# Patient Record
Sex: Female | Born: 1950 | Race: White | Hispanic: No | Marital: Married | Smoking: Former smoker
Health system: Southern US, Community
[De-identification: ages and names within clinical notes are randomized; demographics above are authoritative.]

## PROBLEM LIST (undated history)

## (undated) ENCOUNTER — Emergency Department (HOSPITAL_COMMUNITY): Admission: EM | Payer: BLUE CROSS/BLUE SHIELD | Source: Home / Self Care

## (undated) DIAGNOSIS — E7800 Pure hypercholesterolemia, unspecified: Secondary | ICD-10-CM

## (undated) DIAGNOSIS — J449 Chronic obstructive pulmonary disease, unspecified: Secondary | ICD-10-CM

## (undated) DIAGNOSIS — I509 Heart failure, unspecified: Secondary | ICD-10-CM

## (undated) DIAGNOSIS — F909 Attention-deficit hyperactivity disorder, unspecified type: Secondary | ICD-10-CM

## (undated) DIAGNOSIS — G8929 Other chronic pain: Secondary | ICD-10-CM

## (undated) DIAGNOSIS — K219 Gastro-esophageal reflux disease without esophagitis: Secondary | ICD-10-CM

## (undated) DIAGNOSIS — J441 Chronic obstructive pulmonary disease with (acute) exacerbation: Secondary | ICD-10-CM

## (undated) DIAGNOSIS — G4730 Sleep apnea, unspecified: Secondary | ICD-10-CM

---

## 1998-12-31 ENCOUNTER — Ambulatory Visit (HOSPITAL_COMMUNITY): Admission: RE | Admit: 1998-12-31 | Discharge: 1998-12-31 | Payer: Self-pay | Admitting: Gastroenterology

## 1999-07-31 ENCOUNTER — Encounter: Payer: Self-pay | Admitting: Pulmonary Disease

## 1999-07-31 ENCOUNTER — Ambulatory Visit (HOSPITAL_COMMUNITY): Admission: RE | Admit: 1999-07-31 | Discharge: 1999-07-31 | Payer: Self-pay | Admitting: Pulmonary Disease

## 2002-02-16 ENCOUNTER — Encounter: Payer: Self-pay | Admitting: Pulmonary Disease

## 2002-02-16 ENCOUNTER — Ambulatory Visit (HOSPITAL_COMMUNITY): Admission: RE | Admit: 2002-02-16 | Discharge: 2002-02-16 | Payer: Self-pay | Admitting: Pulmonary Disease

## 2004-03-11 ENCOUNTER — Ambulatory Visit (HOSPITAL_COMMUNITY): Admission: RE | Admit: 2004-03-11 | Discharge: 2004-03-11 | Payer: Self-pay | Admitting: Pulmonary Disease

## 2005-01-21 ENCOUNTER — Ambulatory Visit (HOSPITAL_COMMUNITY): Admission: RE | Admit: 2005-01-21 | Discharge: 2005-01-21 | Payer: Self-pay | Admitting: Pulmonary Disease

## 2008-09-10 ENCOUNTER — Emergency Department (HOSPITAL_BASED_OUTPATIENT_CLINIC_OR_DEPARTMENT_OTHER): Admission: EM | Admit: 2008-09-10 | Discharge: 2008-09-10 | Payer: Self-pay | Admitting: Emergency Medicine

## 2015-05-13 NOTE — Progress Notes (Signed)
65 year old female with PMHx COPD former smoker, no current maintenance inhalers, presents with sepsis and acute hypoxic respiratory failure from pneumonia.  Around Xmas developed URI symptoms that smoldered for a week.  Went to her PCP last week who prescribed Zpak, which she finished ~3days ago.  In the last 24 hours, patient's breathing worsened, felt SOB overnight, feverish, came to ER today and was found:  BP 97/52 RR 22 HR 109 and SpO2 73% RA.  ABG 7.49 pCO2 34 pO2 33 and WBC 20.8K/uL.  SpO2 improved with nebs, but then worsened and so was on BiPAP, better.    CTA shows no PE but bilateral infiltrates.  Flu rapid negative.  Received levofloxacin and 2L NS there.  Lactate pending.    To stepdown here.

## 2015-05-14 ENCOUNTER — Inpatient Hospital Stay (HOSPITAL_COMMUNITY): Payer: BLUE CROSS/BLUE SHIELD

## 2015-05-14 ENCOUNTER — Inpatient Hospital Stay (HOSPITAL_COMMUNITY)
Admission: AD | Admit: 2015-05-14 | Discharge: 2015-05-30 | DRG: 870 | Disposition: A | Discharge status: Rehabilitation Facility | Payer: BLUE CROSS/BLUE SHIELD | Source: Other Acute Inpatient Hospital | Attending: Internal Medicine | Admitting: Internal Medicine

## 2015-05-14 DIAGNOSIS — F329 Major depressive disorder, single episode, unspecified: Secondary | ICD-10-CM | POA: Diagnosis present

## 2015-05-14 DIAGNOSIS — R4182 Altered mental status, unspecified: Secondary | ICD-10-CM | POA: Diagnosis present

## 2015-05-14 DIAGNOSIS — Z01818 Encounter for other preprocedural examination: Secondary | ICD-10-CM

## 2015-05-14 DIAGNOSIS — E876 Hypokalemia: Secondary | ICD-10-CM | POA: Diagnosis present

## 2015-05-14 DIAGNOSIS — E873 Alkalosis: Secondary | ICD-10-CM | POA: Diagnosis not present

## 2015-05-14 DIAGNOSIS — A419 Sepsis, unspecified organism: Secondary | ICD-10-CM | POA: Diagnosis present

## 2015-05-14 DIAGNOSIS — Z87891 Personal history of nicotine dependence: Secondary | ICD-10-CM

## 2015-05-14 DIAGNOSIS — J9690 Respiratory failure, unspecified, unspecified whether with hypoxia or hypercapnia: Secondary | ICD-10-CM

## 2015-05-14 DIAGNOSIS — J810 Acute pulmonary edema: Secondary | ICD-10-CM | POA: Diagnosis present

## 2015-05-14 DIAGNOSIS — E785 Hyperlipidemia, unspecified: Secondary | ICD-10-CM | POA: Diagnosis present

## 2015-05-14 DIAGNOSIS — G9340 Encephalopathy, unspecified: Secondary | ICD-10-CM | POA: Diagnosis not present

## 2015-05-14 DIAGNOSIS — I272 Other secondary pulmonary hypertension: Secondary | ICD-10-CM | POA: Diagnosis present

## 2015-05-14 DIAGNOSIS — Z6820 Body mass index (BMI) 20.0-20.9, adult: Secondary | ICD-10-CM

## 2015-05-14 DIAGNOSIS — I5032 Chronic diastolic (congestive) heart failure: Secondary | ICD-10-CM | POA: Diagnosis present

## 2015-05-14 DIAGNOSIS — J80 Acute respiratory distress syndrome: Secondary | ICD-10-CM | POA: Diagnosis present

## 2015-05-14 DIAGNOSIS — T502X5A Adverse effect of carbonic-anhydrase inhibitors, benzothiadiazides and other diuretics, initial encounter: Secondary | ICD-10-CM | POA: Diagnosis not present

## 2015-05-14 DIAGNOSIS — E870 Hyperosmolality and hypernatremia: Secondary | ICD-10-CM | POA: Diagnosis present

## 2015-05-14 DIAGNOSIS — E440 Moderate protein-calorie malnutrition: Secondary | ICD-10-CM | POA: Diagnosis present

## 2015-05-14 DIAGNOSIS — J441 Chronic obstructive pulmonary disease with (acute) exacerbation: Secondary | ICD-10-CM | POA: Diagnosis not present

## 2015-05-14 DIAGNOSIS — R1310 Dysphagia, unspecified: Secondary | ICD-10-CM

## 2015-05-14 DIAGNOSIS — Z4659 Encounter for fitting and adjustment of other gastrointestinal appliance and device: Secondary | ICD-10-CM

## 2015-05-14 DIAGNOSIS — Z7189 Other specified counseling: Secondary | ICD-10-CM | POA: Diagnosis present

## 2015-05-14 DIAGNOSIS — J9601 Acute respiratory failure with hypoxia: Secondary | ICD-10-CM | POA: Diagnosis not present

## 2015-05-14 DIAGNOSIS — I959 Hypotension, unspecified: Secondary | ICD-10-CM | POA: Diagnosis not present

## 2015-05-14 DIAGNOSIS — J440 Chronic obstructive pulmonary disease with acute lower respiratory infection: Secondary | ICD-10-CM | POA: Diagnosis present

## 2015-05-14 DIAGNOSIS — K5900 Constipation, unspecified: Secondary | ICD-10-CM | POA: Diagnosis not present

## 2015-05-14 DIAGNOSIS — J9600 Acute respiratory failure, unspecified whether with hypoxia or hypercapnia: Secondary | ICD-10-CM

## 2015-05-14 DIAGNOSIS — F419 Anxiety disorder, unspecified: Secondary | ICD-10-CM | POA: Diagnosis present

## 2015-05-14 DIAGNOSIS — R5381 Other malaise: Secondary | ICD-10-CM | POA: Diagnosis not present

## 2015-05-14 DIAGNOSIS — J189 Pneumonia, unspecified organism: Secondary | ICD-10-CM | POA: Diagnosis present

## 2015-05-14 DIAGNOSIS — J849 Interstitial pulmonary disease, unspecified: Secondary | ICD-10-CM | POA: Diagnosis present

## 2015-05-14 DIAGNOSIS — I5030 Unspecified diastolic (congestive) heart failure: Secondary | ICD-10-CM | POA: Diagnosis present

## 2015-05-14 DIAGNOSIS — F411 Generalized anxiety disorder: Secondary | ICD-10-CM | POA: Diagnosis not present

## 2015-05-14 DIAGNOSIS — F10239 Alcohol dependence with withdrawal, unspecified: Secondary | ICD-10-CM | POA: Diagnosis not present

## 2015-05-14 DIAGNOSIS — Z978 Presence of other specified devices: Secondary | ICD-10-CM

## 2015-05-14 DIAGNOSIS — G9341 Metabolic encephalopathy: Secondary | ICD-10-CM | POA: Diagnosis not present

## 2015-05-14 DIAGNOSIS — R0602 Shortness of breath: Secondary | ICD-10-CM | POA: Diagnosis present

## 2015-05-14 DIAGNOSIS — F32A Depression, unspecified: Secondary | ICD-10-CM | POA: Diagnosis present

## 2015-05-14 DIAGNOSIS — R0600 Dyspnea, unspecified: Secondary | ICD-10-CM | POA: Diagnosis not present

## 2015-05-14 DIAGNOSIS — E2740 Unspecified adrenocortical insufficiency: Secondary | ICD-10-CM | POA: Diagnosis present

## 2015-05-14 DIAGNOSIS — K047 Periapical abscess without sinus: Secondary | ICD-10-CM

## 2015-05-14 DIAGNOSIS — J439 Emphysema, unspecified: Secondary | ICD-10-CM | POA: Diagnosis not present

## 2015-05-14 LAB — COMPREHENSIVE METABOLIC PANEL
ALT: 16 U/L (ref 14–54)
ANION GAP: 12 (ref 5–15)
AST: 27 U/L (ref 15–41)
Albumin: 2.4 g/dL — ABNORMAL LOW (ref 3.5–5.0)
Alkaline Phosphatase: 119 U/L (ref 38–126)
BUN: 8 mg/dL (ref 6–20)
CALCIUM: 8.8 mg/dL — AB (ref 8.9–10.3)
CHLORIDE: 107 mmol/L (ref 101–111)
CO2: 23 mmol/L (ref 22–32)
Creatinine, Ser: 0.43 mg/dL — ABNORMAL LOW (ref 0.44–1.00)
Glucose, Bld: 152 mg/dL — ABNORMAL HIGH (ref 65–99)
Potassium: 3.4 mmol/L — ABNORMAL LOW (ref 3.5–5.1)
SODIUM: 142 mmol/L (ref 135–145)
Total Bilirubin: 0.6 mg/dL (ref 0.3–1.2)
Total Protein: 7.2 g/dL (ref 6.5–8.1)

## 2015-05-14 LAB — RAPID URINE DRUG SCREEN, HOSP PERFORMED
Amphetamines: POSITIVE — AB
BARBITURATES: NOT DETECTED
BENZODIAZEPINES: NOT DETECTED
Cocaine: NOT DETECTED
Opiates: POSITIVE — AB
TETRAHYDROCANNABINOL: NOT DETECTED

## 2015-05-14 LAB — URINALYSIS, ROUTINE W REFLEX MICROSCOPIC
Bilirubin Urine: NEGATIVE
Glucose, UA: NEGATIVE mg/dL
HGB URINE DIPSTICK: NEGATIVE
KETONES UR: NEGATIVE mg/dL
LEUKOCYTES UA: NEGATIVE
Nitrite: NEGATIVE
PROTEIN: NEGATIVE mg/dL
Specific Gravity, Urine: 1.01 (ref 1.005–1.030)
pH: 6 (ref 5.0–8.0)

## 2015-05-14 LAB — CBC WITH DIFFERENTIAL/PLATELET
Basophils Absolute: 0 10*3/uL (ref 0.0–0.1)
Basophils Relative: 0 %
EOS ABS: 0 10*3/uL (ref 0.0–0.7)
EOS PCT: 0 %
HCT: 34 % — ABNORMAL LOW (ref 36.0–46.0)
Hemoglobin: 11.2 g/dL — ABNORMAL LOW (ref 12.0–15.0)
LYMPHS ABS: 0.9 10*3/uL (ref 0.7–4.0)
Lymphocytes Relative: 5 %
MCH: 30.3 pg (ref 26.0–34.0)
MCHC: 32.9 g/dL (ref 30.0–36.0)
MCV: 91.9 fL (ref 78.0–100.0)
MONO ABS: 0.3 10*3/uL (ref 0.1–1.0)
MONOS PCT: 2 %
Neutro Abs: 17.4 10*3/uL — ABNORMAL HIGH (ref 1.7–7.7)
Neutrophils Relative %: 93 %
PLATELETS: 446 10*3/uL — AB (ref 150–400)
RBC: 3.7 MIL/uL — ABNORMAL LOW (ref 3.87–5.11)
RDW: 13 % (ref 11.5–15.5)
WBC: 18.6 10*3/uL — AB (ref 4.0–10.5)

## 2015-05-14 LAB — EXPECTORATED SPUTUM ASSESSMENT W GRAM STAIN, RFLX TO RESP C

## 2015-05-14 LAB — PROCALCITONIN: Procalcitonin: 0.1 ng/mL

## 2015-05-14 LAB — EXPECTORATED SPUTUM ASSESSMENT W REFEX TO RESP CULTURE

## 2015-05-14 LAB — STREP PNEUMONIAE URINARY ANTIGEN: STREP PNEUMO URINARY ANTIGEN: NEGATIVE

## 2015-05-14 LAB — TROPONIN I: TROPONIN I: 0.15 ng/mL — AB (ref <0.031)

## 2015-05-14 LAB — GLUCOSE, CAPILLARY: Glucose-Capillary: 142 mg/dL — ABNORMAL HIGH (ref 65–99)

## 2015-05-14 LAB — MRSA PCR SCREENING: MRSA by PCR: NEGATIVE

## 2015-05-14 LAB — LACTIC ACID, PLASMA: LACTIC ACID, VENOUS: 2 mmol/L (ref 0.5–2.0)

## 2015-05-14 MED ORDER — POTASSIUM CHLORIDE 20 MEQ/15ML (10%) PO SOLN: 40.0000 meq | Freq: Once | ORAL | Status: DC

## 2015-05-14 MED ORDER — VANCOMYCIN HCL 500 MG IV SOLR
500.0000 mg | Freq: Two times a day (BID) | INTRAVENOUS | Status: DC
  Administered 2015-05-14 – 2015-05-17 (×6): 500 mg via INTRAVENOUS
  Filled 2015-05-14 (×7): qty 500

## 2015-05-14 MED ORDER — DEXTROSE 5 % IV SOLN
500.0000 mg | INTRAVENOUS | Status: DC
  Administered 2015-05-14 – 2015-05-15 (×2): 500 mg via INTRAVENOUS
  Filled 2015-05-14 (×2): qty 500

## 2015-05-14 MED ORDER — ROSUVASTATIN CALCIUM 10 MG PO TABS
10.0000 mg | ORAL_TABLET | Freq: Every day | ORAL | Status: DC
  Administered 2015-05-14 – 2015-05-25 (×12): 10 mg via ORAL
  Filled 2015-05-14 (×13): qty 1

## 2015-05-14 MED ORDER — FLUOXETINE HCL 20 MG PO CAPS
40.0000 mg | ORAL_CAPSULE | Freq: Every day | ORAL | Status: DC
  Administered 2015-05-16 – 2015-05-30 (×14): 40 mg via ORAL
  Filled 2015-05-14 (×16): qty 2

## 2015-05-14 MED ORDER — VANCOMYCIN HCL IN DEXTROSE 1-5 GM/200ML-% IV SOLN
1000.0000 mg | Freq: Once | INTRAVENOUS | Status: AC
  Administered 2015-05-14: 1000 mg via INTRAVENOUS
  Filled 2015-05-14: qty 200

## 2015-05-14 MED ORDER — POTASSIUM CHLORIDE CRYS ER 20 MEQ PO TBCR
40.0000 meq | EXTENDED_RELEASE_TABLET | Freq: Once | ORAL | Status: AC
  Administered 2015-05-14: 40 meq via ORAL
  Filled 2015-05-14: qty 2

## 2015-05-14 MED ORDER — PIPERACILLIN-TAZOBACTAM 3.375 G IVPB
3.3750 g | Freq: Three times a day (TID) | INTRAVENOUS | Status: DC
  Administered 2015-05-14 – 2015-05-17 (×10): 3.375 g via INTRAVENOUS
  Filled 2015-05-14 (×11): qty 50

## 2015-05-14 MED ORDER — FLUOXETINE HCL 10 MG PO CAPS
10.0000 mg | ORAL_CAPSULE | Freq: Every day | ORAL | Status: DC
  Administered 2015-05-14: 10 mg via ORAL
  Filled 2015-05-14: qty 1

## 2015-05-14 MED ORDER — CETYLPYRIDINIUM CHLORIDE 0.05 % MT LIQD
7.0000 mL | Freq: Two times a day (BID) | OROMUCOSAL | Status: DC
  Administered 2015-05-14 (×2): 7 mL via OROMUCOSAL

## 2015-05-14 MED ORDER — HEPARIN SODIUM (PORCINE) 5000 UNIT/ML IJ SOLN
5000.0000 [IU] | Freq: Three times a day (TID) | INTRAMUSCULAR | Status: DC
  Administered 2015-05-14 – 2015-05-15 (×4): 5000 [IU] via SUBCUTANEOUS
  Filled 2015-05-14 (×7): qty 1

## 2015-05-14 MED ORDER — IPRATROPIUM-ALBUTEROL 0.5-2.5 (3) MG/3ML IN SOLN: 3.0000 mL | RESPIRATORY_TRACT | Status: DC | PRN

## 2015-05-14 MED ORDER — FUROSEMIDE 10 MG/ML IJ SOLN
40.0000 mg | Freq: Once | INTRAMUSCULAR | Status: AC
  Administered 2015-05-14: 40 mg via INTRAVENOUS
  Filled 2015-05-14: qty 4

## 2015-05-14 MED ORDER — PIPERACILLIN-TAZOBACTAM 3.375 G IVPB
3.3750 g | Freq: Once | INTRAVENOUS | Status: AC
  Administered 2015-05-14: 3.375 g via INTRAVENOUS
  Filled 2015-05-14: qty 50

## 2015-05-14 MED ORDER — SODIUM CHLORIDE 0.9 % IV SOLN
INTRAVENOUS | Status: DC
  Administered 2015-05-14 – 2015-05-16 (×4): via INTRAVENOUS

## 2015-05-14 MED ORDER — HYDROCODONE-ACETAMINOPHEN 7.5-325 MG PO TABS
1.0000 | ORAL_TABLET | Freq: Four times a day (QID) | ORAL | Status: DC | PRN
  Administered 2015-05-14 – 2015-05-19 (×4): 1 via ORAL
  Filled 2015-05-14 (×4): qty 1

## 2015-05-14 MED ORDER — SODIUM CHLORIDE 0.9 % IV SOLN: 250.0000 mL | INTRAVENOUS | Status: DC | PRN

## 2015-05-14 NOTE — H&P (Signed)
PULMONARY / CRITICAL CARE MEDICINE   Name: Julia Massey MRN: [id number] DOB: May 05, 1951    ADMISSION DATE:  05/14/2015 CONSULTATION DATE:  05/13/14  REFERRING MD:  [facility]  CHIEF COMPLAINT:  SOB  HISTORY OF PRESENT ILLNESS:  Julia Massey is a 65 y.o. female with PMH of HLD, anxiety, depression, and COPD (never had formal PFT's, was only told she had COPD per CXR report).  She began to feel sick around christmas time 2016 with cough productive of thick green sputum and fevers (Tmax 102) / chills.  She saw her PCP who placed her on a z- pack which she completed 2 days ago.  She apparently was initially feeling somewhat better, then began to have worsening SOB 1 day ago.  She felt very hot while sleeping but never took her temperature.  She continued to have cough productive of thick green sputum and had some mild chest pain that was mostly associated with coughing.  She reports that she had been around a lot of children over the holidays, some who had the cold. Otherwise, no exposures to known sick contacts, no recent travel, no recent hospitalizations.  She has not had any N/V/D, myalgias, hemoptysis.   She is a former smoker, quit some 2 months ago per her report.  Prior to that she had a roughly 40 - 50 pack year history.  She reports she was told a few years ago that she had COPD based off CXR but she has never been on any treatments and has never seen a pulmonologist.  On 01/04, she went to [facility] where she was found to be hypoxic to the point that she required BiPAP (was 73% on RA).  Trial off BiPAP was attempted, but she unfortunately failed.  CTA was obtained which revealed extensive bilateral patchy infiltrates c/w atypical PNA.  Additional labs noteable for WBC 20.8 (85% neut's), K 3.2, BNP 1252, Trop 0.039, lactic acid 2.5.  Flu PCR was negative.  She was subsequently transferred to [facility] for further evaluation and management.  PAST MEDICAL  HISTORY :  HLD, anxiety, depression, "COPD". She  has no past medical history on file.  PAST SURGICAL HISTORY: She  has no past surgical history on file.  MEDICATIONS: Fluoxetine, Norco, Crestor.  ALLERGIES:  NKDA  No current facility-administered medications on file prior to encounter.   No current outpatient prescriptions on file prior to encounter.    FAMILY HISTORY:  Her has no family status information on file.   SOCIAL HISTORY: She    REVIEW OF SYSTEMS:   All negative; except for those that are bolded, which indicate positives.  Constitutional: weight loss, weight gain, night sweats, fevers, chills, fatigue, weakness.  HEENT: headaches, sore throat, sneezing, nasal congestion, post nasal drip, difficulty swallowing, tooth/dental problems, visual complaints, visual changes, ear aches. Neuro: difficulty with speech, weakness, numbness, ataxia. CV:  chest pain, orthopnea, PND, swelling in lower extremities, dizziness, palpitations, syncope.  Resp: cough, hemoptysis, dyspnea, wheezing. GI  heartburn, indigestion, abdominal pain, nausea, vomiting, diarrhea, constipation, change in bowel habits, loss of appetite, hematemesis, melena, hematochezia.  GU: dysuria, change in color of urine, urgency or frequency, flank pain, hematuria. MSK: joint pain or swelling, decreased range of motion. Psych: change in mood or affect, depression, anxiety, suicidal ideations, homicidal ideations. Skin: rash, itching, bruising.   SUBJECTIVE: Breathing has improved compared to when she first arrived at [facility].  She currently denies difficulty breathing, chest pain.  Feels comfortable on  NRB.  VITAL SIGNS: Temp(Src) 98.2 F (36.8 C) (Axillary)  Ht 5' (1.524 m)  Wt 53.8 kg (118 lb 9.7 oz)  BMI 23.16 kg/m2  HEMODYNAMICS:    VENTILATOR SETTINGS:    INTAKE / OUTPUT:     PHYSICAL EXAMINATION: General: Adult female, in NAD. Neuro: A&O x 3, non-focal.  HEENT: Jessie/AT.  PERRL, sclerae anicteric. Cardiovascular: RRR, no M/R/G.  Lungs: Respirations even and unlabored.  CTA bilaterally, No W/R/R. Abdomen: BS x 4, soft, NT/ND.  Musculoskeletal: No gross deformities, no edema.  Skin: Intact, warm, no rashes.  LABS:  BMET No results for input(s): NA, K, CL, CO2, BUN, CREATININE, GLUCOSE in the last 168 hours.  Electrolytes No results for input(s): CALCIUM, MG, PHOS in the last 168 hours.  CBC No results for input(s): WBC, HGB, HCT, PLT in the last 168 hours.  Coag's No results for input(s): APTT, INR in the last 168 hours.  Sepsis Markers No results for input(s): LATICACIDVEN, PROCALCITON, O2SATVEN in the last 168 hours.  ABG No results for input(s): PHART, PCO2ART, PO2ART in the last 168 hours.  Liver Enzymes No results for input(s): AST, ALT, ALKPHOS, BILITOT, ALBUMIN in the last 168 hours.  Cardiac Enzymes No results for input(s): TROPONINI, PROBNP in the last 168 hours.  Glucose No results for input(s): GLUCAP in the last 168 hours.  Imaging No results found.   STUDIES:  CTA 01/03 > no PE.  Extensive bilateral patchy pulmonary infiltrates c/w atypical PNA.  Additional peripheral pulmonary fibrosis at both lung bases.  Mediastinal and hilar adenopathy, likely reactive.  CULTURES: Blood 01/04 > Urine 01/04 > Sputum 01/04 > U. Strep 01/04 > U. Legionella 01/04 >  ANTIBIOTICS: Vanc 01/04 > Zosyn 01/04 > Azithro 01/04 >  SIGNIFICANT EVENTS: 01/04 > admitted with acute hypoxic respiratory failure and concern for atypical PNA.    ASSESSMENT / PLAN:  PULMONARY A: Acute hypoxic respiratory failure with bilateral infiltrates on CTA - concerning for atypical PNA vs post viral pneumonitis vs volume overload given her elevated BNP. Former smoker. P:   Continue supplemental O2 as needed to maintain SpO2 > 92%. Empiric abx as above (vanc / zosyn / azithro). Assess PCT. Lasix 40mg  now. CXR in AM.  INFECTIOUS A:   Concern for  atypical PNA vs post viral pneumonitis. P:   Abx as above (vanc / zosyn / azithro).  Follow cultures as above. PCT algorithm to limit abx exposure.  CARDIOVASCULAR A:  Elevated BNP (1252) - no hx of heart failure, not volume overloaded on exam. Mild troponin leak. Hx HLD. P:  Lasix 40mg  now. Consider echo. Repeat troponin, lactate. Continue outpatient crestor.  RENAL A:   Hypokalemia. P:   K PO. NS @ 75. BMP in AM.  GASTROINTESTINAL A:   Nutrition. P:   NPO for now.  HEMATOLOGIC A:   VTE Prophylaxis. P:  SCD's / Heparin. CBC in AM.  ENDOCRINE A:   No known issues.   P:   Monitor glucose on BMP.  NEUROLOGIC A:   Hx anxiety / depression. P:   Continue outpatient fluoxetine. Hold outpatient norco.   Family updated: None.  Interdisciplinary Family Meeting v Palliative Care Meeting:  Due by: 01/10.   , 03/10 - [facility] [place] [facility] Pager: [phone]  or [phone] 05/14/2015, 2:51 AM

## 2015-05-14 NOTE — Progress Notes (Signed)
Utilization Review Completed.Julia Massey T1/08/2015  

## 2015-05-14 NOTE — Progress Notes (Signed)
ANTIBIOTIC CONSULT NOTE - INITIAL  Pharmacy Consult for Vancomycin, Zosyn, Azithromycin  Indication: HCAP    Allergies not on file  Patient Measurements:   Actual Body Weight: 53.8 kg   Vital Signs:   Intake/Output from previous day:   Intake/Output from this shift:    Labs: No results for input(s): WBC, HGB, PLT, LABCREA, CREATININE in the last 72 hours. CrCl cannot be calculated (Unknown ideal weight.). No results for input(s): VANCOTROUGH, VANCOPEAK, VANCORANDOM, GENTTROUGH, GENTPEAK, GENTRANDOM, TOBRATROUGH, TOBRAPEAK, TOBRARND, AMIKACINPEAK, AMIKACINTROU, AMIKACIN in the last 72 hours.   Microbiology: No results found for this or any previous visit (from the past 720 hour(s)).  Medical History: No past medical history on file.  Medications:  No prescriptions prior to admission   Assessment: 11 YOF who presents with productive cough and fevers. Recently completed a course of azithromycin. Pharmacy consulted to start empiric IV antibiotics for pneumonia. WBC elevated at 18.6. LA 2. CrCl ~ 52 mL/min   Goal of Therapy:  Vancomycin trough level 15-20 mcg/ml  Plan:  -Vancomycin 1 gm IV once followed by Vancomycin 500 mg IV Q 12 hours  -Zosyn 3.375 gm IV Q 8 hours -Azithromycin 500 mg IV Q 24 hours -Monitor CBC, renal fx, cultures and clinical progress -VT at [facility]  Vinnie Level, PharmD., BCPS Clinical Pharmacist Pager [phone]

## 2015-05-14 NOTE — Evaluation (Signed)
Clinical/Bedside Swallow Evaluation Patient Details  Name: Julia Massey MRN: [id number] Date of Birth: January 27, 1951  Today's Date: 05/14/2015 Time: SLP Start Time (ACUTE ONLY): 5956 SLP Stop Time (ACUTE ONLY): 0949 SLP Time Calculation (min) (ACUTE ONLY): 23 min  Past Medical History: No past medical history on file. Past Surgical History: No past surgical history on file. HPI:  65 year old female admitted with acute hypoxic respiratory failure and concern for atypical PNA vs post viral pneumonitis vs volume overload given elevated BNP. PMH of HLD, anxiety, depression, and COPD (never had formal PFT's, was only told she had COPD per CXR report).    Assessment / Plan / Recommendation Clinical Impression  Swallow evaluation complete. Patient with a suspected primary esophageal dysphagia characterized by what appears to be a normal oropharyngeal swallow without overt indication of aspiration with tested pos but with chronic appearing throat clear (at baseline), c/o globus, mid sternal pressure during po intake, and occassional regurgitation of solid pos. Given symptoms, cannot r/o aspiration due to LPR as cause of acute illness. Provided patient with general safe swallowing precautions and will f/u to reinforce. Recommend deferring MBS at this time and instead proceeding with barium swallow to assess esophageal function. If oropharyngeal difficulties are noted during testing, could proceed with MBS. Discussed with CCM MD. When stable from a respiratory standpoint, recommend regular diet, thin liquid with esophageal precautions. Will f/u.     Aspiration Risk  Mild aspiration risk    Diet Recommendation Regular;Thin liquid (when stable per MD from respiratory standpoint)   Liquid Administration via: Cup;Straw Medication Administration: Whole meds with liquid Supervision: Patient able to self feed;Intermittent supervision to cue for compensatory strategies Compensations: Slow rate;Small  sips/bites;Follow solids with liquid Postural Changes: Seated upright at 90 degrees;Remain upright for at least 30 minutes after po intake    Other  Recommendations Recommended Consults: Consider esophageal assessment Oral Care Recommendations: Oral care BID   Follow up Recommendations  Other (comment) (TBD, suspect none)    Frequency and Duration min 1 x/week  1 week           Swallow Study   General HPI: 65 year old female admitted with acute hypoxic respiratory failure and concern for atypical PNA vs post viral pneumonitis vs volume overload given elevated BNP. PMH of HLD, anxiety, depression, and COPD (never had formal PFT's, was only told she had COPD per CXR report).  Type of Study: Bedside Swallow Evaluation Previous Swallow Assessment: none Diet Prior to this Study: NPO Temperature Spikes Noted: No Respiratory Status: Non-rebreather History of Recent Intubation: No Behavior/Cognition: Alert;Cooperative;Pleasant mood Oral Cavity Assessment: Dry;Erythema Oral Care Completed by SLP: Recent completion by staff Oral Cavity - Dentition: Adequate natural dentition Vision: Functional for self-feeding Self-Feeding Abilities: Able to feed self Patient Positioning: Upright in bed Baseline Vocal Quality: Normal Volitional Cough: Strong Volitional Swallow: Able to elicit    Oral/Motor/Sensory Function Overall Oral Motor/Sensory Function: Within functional limits   Ice Chips Ice chips: Not tested   Thin Liquid Thin Liquid: Within functional limits Presentation: Self Fed;Straw    Nectar Thick Nectar Thick Liquid: Not tested   Honey Thick Honey Thick Liquid: Not tested   Puree Puree: Within functional limits Presentation: Spoon   Solid   GO   Davien Malone MA, CCC-SLP [phone]  Solid: Within functional limits Presentation: Self Fed       Mattheo Swindle Meryl 05/14/2015,9:56 AM

## 2015-05-14 NOTE — Progress Notes (Signed)
Initial Nutrition Assessment  INTERVENTION:  Diet advancement per MD Provide Ensure Enlive po BID when diet is advanced, each supplement provides 350 kcal and 20 grams of protein Provide snacks BID when diet is advanced   NUTRITION DIAGNOSIS:   Inadequate oral intake related to acute illness, poor appetite as evidenced by per patient/family report, moderate depletions of muscle mass, percent weight loss.   GOAL:   Patient will meet greater than or equal to 90% of their needs   MONITOR:   Diet advancement, PO intake, Supplement acceptance, Labs, Weight trends, Skin  REASON FOR ASSESSMENT:   Malnutrition Screening Tool    ASSESSMENT:   65 y.o. female with PMH of HLD, anxiety, depression, and COPD (never had formal PFT's, was only told she had COPD per CXR report). She began to feel sick around christmas time 2016 with cough productive of thick green sputum and fevers (Tmax 102) / chills. She saw her PCP who placed her on a z- pack which she completed 2 days ago. She apparently was initially feeling somewhat better, then began to have worsening SOB 1 day ago.  Pt on Venturi mask at time of visit. Remains NPO at this time, but evaluated by SLP this AM and approved for Regular diet with thin liquids. Pt reports that she normally eats several small meals throughout the day and she has been skipping a few meals over the past 2 weeks due to being sick and having a poor appetite. She reports losing a few pounds; she thinks she normally weighs 120 to 122 lbs. She feels she has lost some of her muscle mass. She has some moderate muscle wasting and mild fat wasting per nutrition-focused physical exam. She is agreeable to receiving snacks (cottage cheese, fruit, crackers) and Ensure when her diet is advanced.   Labs reviewed.   Diet Order:  Diet NPO time specified  Skin:  Reviewed, no issues  Last BM:  1/3  Height:   Ht Readings from Last 1 Encounters:  05/14/15 5' (1.524 m)     Weight:   Wt Readings from Last 1 Encounters:  05/14/15 118 lb 9.7 oz (53.8 kg)    Ideal Body Weight:  45.5 kg  BMI:  Body mass index is 23.16 kg/(m^2).  Estimated Nutritional Needs:   Kcal:  1600-1800  Protein:  75-85 grams  Fluid:  1.6 L/day  EDUCATION NEEDS:   No education needs identified at this time  Dorothea Ogle RD, LDN Inpatient Clinical Dietitian Pager: [phone] After Hours Pager: [phone]

## 2015-05-14 NOTE — Progress Notes (Signed)
RT attempted patient on 6L [place], she did not tolerate it well as Sats dropped to 85%. Patient placed back on 100% Non Rebreather with BIPAP on stand by.

## 2015-05-15 ENCOUNTER — Inpatient Hospital Stay (HOSPITAL_COMMUNITY): Payer: BLUE CROSS/BLUE SHIELD

## 2015-05-15 DIAGNOSIS — Z7189 Other specified counseling: Secondary | ICD-10-CM | POA: Diagnosis present

## 2015-05-15 DIAGNOSIS — Z4659 Encounter for fitting and adjustment of other gastrointestinal appliance and device: Secondary | ICD-10-CM

## 2015-05-15 DIAGNOSIS — E440 Moderate protein-calorie malnutrition: Secondary | ICD-10-CM

## 2015-05-15 DIAGNOSIS — J9690 Respiratory failure, unspecified, unspecified whether with hypoxia or hypercapnia: Secondary | ICD-10-CM | POA: Diagnosis present

## 2015-05-15 DIAGNOSIS — J9600 Acute respiratory failure, unspecified whether with hypoxia or hypercapnia: Secondary | ICD-10-CM

## 2015-05-15 DIAGNOSIS — R0600 Dyspnea, unspecified: Secondary | ICD-10-CM

## 2015-05-15 LAB — BLOOD GAS, ARTERIAL
ACID-BASE DEFICIT: 2.5 mmol/L — AB (ref 0.0–2.0)
Acid-base deficit: 2 mmol/L (ref 0.0–2.0)
BICARBONATE: 24.2 meq/L — AB (ref 20.0–24.0)
Bicarbonate: 23.8 mEq/L (ref 20.0–24.0)
DRAWN BY: 246101
Drawn by: 246101
FIO2: 0.8
FIO2: 0.7
LHR: 35 {breaths}/min
MECHVT: 320 mL
MECHVT: 320 mL
O2 SAT: 99.2 %
O2 Saturation: 99.1 %
PATIENT TEMPERATURE: 98.6
PATIENT TEMPERATURE: 98.6
PCO2 ART: 61.1 mmHg — AB (ref 35.0–45.0)
PCO2 ART: 52.2 mmHg — AB (ref 35.0–45.0)
PEEP/CPAP: 14 cmH2O
PEEP: 12 cmH2O
PH ART: 7.221 — AB (ref 7.350–7.450)
PO2 ART: 229 mmHg — AB (ref 80.0–100.0)
PO2 ART: 190 mmHg — AB (ref 80.0–100.0)
RATE: 35 resp/min
TCO2: 26 mmol/L (ref 0–100)
TCO2: 25.4 mmol/L (ref 0–100)
pH, Arterial: 7.282 — ABNORMAL LOW (ref 7.350–7.450)

## 2015-05-15 LAB — URINE CULTURE: CULTURE: NO GROWTH

## 2015-05-15 LAB — POCT I-STAT 3, ART BLOOD GAS (G3+)
ACID-BASE DEFICIT: 2 mmol/L (ref 0.0–2.0)
BICARBONATE: 24.9 meq/L — AB (ref 20.0–24.0)
O2 SAT: 96 %
PCO2 ART: 48.2 mmHg — AB (ref 35.0–45.0)
PH ART: 7.319 — AB (ref 7.350–7.450)
PO2 ART: 91 mmHg (ref 80.0–100.0)
Patient temperature: 97.7
TCO2: 26 mmol/L (ref 0–100)

## 2015-05-15 LAB — C-REACTIVE PROTEIN: CRP: 26.1 mg/dL — ABNORMAL HIGH (ref <1.0)

## 2015-05-15 LAB — CBC WITH DIFFERENTIAL/PLATELET
BASOS ABS: 0 10*3/uL (ref 0.0–0.1)
BASOS PCT: 0 %
EOS PCT: 2 %
Eosinophils Absolute: 0.4 10*3/uL (ref 0.0–0.7)
HEMATOCRIT: 36.6 % (ref 36.0–46.0)
Hemoglobin: 11.9 g/dL — ABNORMAL LOW (ref 12.0–15.0)
Lymphocytes Relative: 7 %
Lymphs Abs: 1.5 10*3/uL (ref 0.7–4.0)
MCH: 30.9 pg (ref 26.0–34.0)
MCHC: 32.5 g/dL (ref 30.0–36.0)
MCV: 95.1 fL (ref 78.0–100.0)
MONO ABS: 0.9 10*3/uL (ref 0.1–1.0)
Monocytes Relative: 4 %
NEUTROS ABS: 18.4 10*3/uL — AB (ref 1.7–7.7)
Neutrophils Relative %: 87 %
PLATELETS: 429 10*3/uL — AB (ref 150–400)
RBC: 3.85 MIL/uL — ABNORMAL LOW (ref 3.87–5.11)
RDW: 13.2 % (ref 11.5–15.5)
WBC: 21.3 10*3/uL — AB (ref 4.0–10.5)

## 2015-05-15 LAB — BODY FLUID CELL COUNT WITH DIFFERENTIAL
EOS FL: 2 %
Lymphs, Fluid: 1 %
Monocyte-Macrophage-Serous Fluid: 4 % — ABNORMAL LOW (ref 50–90)
NEUTROPHIL FLUID: 93 % — AB (ref 0–25)
Total Nucleated Cell Count, Fluid: 610 cu mm (ref 0–1000)

## 2015-05-15 LAB — BASIC METABOLIC PANEL
ANION GAP: 10 (ref 5–15)
BUN: 14 mg/dL (ref 6–20)
CALCIUM: 8.9 mg/dL (ref 8.9–10.3)
CO2: 27 mmol/L (ref 22–32)
Chloride: 103 mmol/L (ref 101–111)
Creatinine, Ser: 0.57 mg/dL (ref 0.44–1.00)
Glucose, Bld: 137 mg/dL — ABNORMAL HIGH (ref 65–99)
POTASSIUM: 4.2 mmol/L (ref 3.5–5.1)
SODIUM: 140 mmol/L (ref 135–145)

## 2015-05-15 LAB — PROTIME-INR
INR: 1.31 (ref 0.00–1.49)
PROTHROMBIN TIME: 16.4 s — AB (ref 11.6–15.2)

## 2015-05-15 LAB — GRAM STAIN

## 2015-05-15 LAB — GLUCOSE, CAPILLARY
Glucose-Capillary: 109 mg/dL — ABNORMAL HIGH (ref 65–99)
Glucose-Capillary: 111 mg/dL — ABNORMAL HIGH (ref 65–99)

## 2015-05-15 LAB — APTT: APTT: 33 s (ref 24–37)

## 2015-05-15 LAB — SEDIMENTATION RATE: Sed Rate: 93 mm/hr — ABNORMAL HIGH (ref 0–22)

## 2015-05-15 LAB — LEGIONELLA ANTIGEN, URINE

## 2015-05-15 MED ORDER — PANTOPRAZOLE SODIUM 40 MG IV SOLR
40.0000 mg | Freq: Every day | INTRAVENOUS | Status: DC
Start: 2015-05-15 — End: 2015-05-19
  Administered 2015-05-15 – 2015-05-18 (×4): 40 mg via INTRAVENOUS
  Filled 2015-05-15 (×6): qty 40

## 2015-05-15 MED ORDER — MIDAZOLAM BOLUS VIA INFUSION
2.0000 mg | INTRAVENOUS | Status: DC | PRN
  Filled 2015-05-15: qty 2

## 2015-05-15 MED ORDER — MIDAZOLAM HCL 2 MG/2ML IJ SOLN
2.0000 mg | Freq: Once | INTRAMUSCULAR | Status: AC
  Administered 2015-05-15: 2 mg via INTRAVENOUS

## 2015-05-15 MED ORDER — SODIUM CHLORIDE 0.9 % IV BOLUS (SEPSIS)
1000.0000 mL | Freq: Once | INTRAVENOUS | Status: AC
  Administered 2015-05-15: 1000 mL via INTRAVENOUS

## 2015-05-15 MED ORDER — PROPOFOL 10 MG/ML IV BOLUS
INTRAVENOUS | Status: AC
  Filled 2015-05-15: qty 20

## 2015-05-15 MED ORDER — IOHEXOL 350 MG/ML SOLN
80.0000 mL | Freq: Once | INTRAVENOUS | Status: AC | PRN
  Administered 2015-05-15: 100 mL via INTRAVENOUS

## 2015-05-15 MED ORDER — SODIUM CHLORIDE 0.9 % IJ SOLN
10.0000 mL | Freq: Two times a day (BID) | INTRAMUSCULAR | Status: DC
  Administered 2015-05-15: 30 mL
  Administered 2015-05-15: 40 mL
  Administered 2015-05-16: 30 mL
  Administered 2015-05-16: 20 mL
  Administered 2015-05-17 – 2015-05-25 (×17): 10 mL
  Administered 2015-05-26: 20 mL
  Administered 2015-05-26 – 2015-05-27 (×2): 10 mL
  Administered 2015-05-27: 40 mL
  Administered 2015-05-28: 10 mL

## 2015-05-15 MED ORDER — ROCURONIUM BROMIDE 50 MG/5ML IV SOLN
30.0000 mg | INTRAVENOUS | Status: AC
  Administered 2015-05-15: 30 mg via INTRAVENOUS

## 2015-05-15 MED ORDER — METHYLPREDNISOLONE SODIUM SUCC 40 MG IJ SOLR
40.0000 mg | Freq: Two times a day (BID) | INTRAMUSCULAR | Status: DC
  Administered 2015-05-15: 40 mg via INTRAVENOUS
  Filled 2015-05-15 (×2): qty 1

## 2015-05-15 MED ORDER — SODIUM CHLORIDE 0.9 % IV BOLUS (SEPSIS)
250.0000 mL | Freq: Once | INTRAVENOUS | Status: AC
  Administered 2015-05-15: 250 mL via INTRAVENOUS

## 2015-05-15 MED ORDER — SODIUM CHLORIDE 0.9 % IV SOLN
25.0000 ug/h | INTRAVENOUS | Status: DC
  Filled 2015-05-15 (×2): qty 50

## 2015-05-15 MED ORDER — FENTANYL CITRATE (PF) 100 MCG/2ML IJ SOLN: 100.0000 ug | Freq: Once | INTRAMUSCULAR | Status: DC

## 2015-05-15 MED ORDER — CHLORHEXIDINE GLUCONATE 0.12% ORAL RINSE (MEDLINE KIT)
15.0000 mL | Freq: Two times a day (BID) | OROMUCOSAL | Status: DC
  Administered 2015-05-15 – 2015-05-26 (×23): 15 mL via OROMUCOSAL

## 2015-05-15 MED ORDER — MIDAZOLAM HCL 2 MG/2ML IJ SOLN
INTRAMUSCULAR | Status: AC
  Administered 2015-05-15: 2 mg
  Filled 2015-05-15: qty 4

## 2015-05-15 MED ORDER — CISATRACURIUM BOLUS VIA INFUSION
0.1000 mg/kg | Freq: Once | INTRAVENOUS | Status: AC
  Administered 2015-05-15: 5.4 mg via INTRAVENOUS
  Filled 2015-05-15: qty 6

## 2015-05-15 MED ORDER — HEPARIN SODIUM (PORCINE) 5000 UNIT/ML IJ SOLN
5000.0000 [IU] | Freq: Three times a day (TID) | INTRAMUSCULAR | Status: DC
  Administered 2015-05-15 – 2015-05-30 (×46): 5000 [IU] via SUBCUTANEOUS
  Filled 2015-05-15 (×46): qty 1

## 2015-05-15 MED ORDER — FENTANYL CITRATE (PF) 100 MCG/2ML IJ SOLN
INTRAMUSCULAR | Status: AC
  Administered 2015-05-15: 100 ug
  Filled 2015-05-15: qty 4

## 2015-05-15 MED ORDER — MIDAZOLAM HCL 2 MG/2ML IJ SOLN
2.0000 mg | Freq: Once | INTRAMUSCULAR | Status: AC | PRN
  Administered 2015-05-15: 2 mg via INTRAVENOUS

## 2015-05-15 MED ORDER — FENTANYL CITRATE (PF) 100 MCG/2ML IJ SOLN: 50.0000 ug | Freq: Once | INTRAMUSCULAR | Status: DC

## 2015-05-15 MED ORDER — SODIUM CHLORIDE 0.9 % IJ SOLN: 10.0000 mL | INTRAMUSCULAR | Status: DC | PRN

## 2015-05-15 MED ORDER — FENTANYL BOLUS VIA INFUSION
50.0000 ug | INTRAVENOUS | Status: DC | PRN
  Administered 2015-05-19 (×3): 50 ug via INTRAVENOUS
  Filled 2015-05-15: qty 50

## 2015-05-15 MED ORDER — METHYLPREDNISOLONE SODIUM SUCC 125 MG IJ SOLR
80.0000 mg | Freq: Four times a day (QID) | INTRAMUSCULAR | Status: DC
  Administered 2015-05-15 – 2015-05-17 (×8): 80 mg via INTRAVENOUS
  Filled 2015-05-15: qty 1.28
  Filled 2015-05-15 (×2): qty 2
  Filled 2015-05-15 (×7): qty 1.28
  Filled 2015-05-15: qty 2

## 2015-05-15 MED ORDER — HEPARIN BOLUS VIA INFUSION
3000.0000 [IU] | Freq: Once | INTRAVENOUS | Status: DC
  Filled 2015-05-15: qty 3000

## 2015-05-15 MED ORDER — LEVOFLOXACIN IN D5W 750 MG/150ML IV SOLN
750.0000 mg | INTRAVENOUS | Status: DC
  Administered 2015-05-15 – 2015-05-21 (×7): 750 mg via INTRAVENOUS
  Filled 2015-05-15 (×15): qty 150

## 2015-05-15 MED ORDER — FENTANYL BOLUS VIA INFUSION
50.0000 ug | INTRAVENOUS | Status: DC | PRN
  Filled 2015-05-15: qty 50

## 2015-05-15 MED ORDER — ARTIFICIAL TEARS OP OINT
1.0000 "application " | TOPICAL_OINTMENT | Freq: Three times a day (TID) | OPHTHALMIC | Status: DC
  Administered 2015-05-15 – 2015-05-18 (×11): 1 via OPHTHALMIC
  Filled 2015-05-15: qty 3.5

## 2015-05-15 MED ORDER — SODIUM CHLORIDE 0.9 % IV SOLN
25.0000 ug/h | INTRAVENOUS | Status: DC
  Administered 2015-05-15 – 2015-05-17 (×3): 100 ug/h via INTRAVENOUS
  Administered 2015-05-18: 200 ug/h via INTRAVENOUS
  Administered 2015-05-19: 250 ug/h via INTRAVENOUS
  Administered 2015-05-20 – 2015-05-22 (×4): 200 ug/h via INTRAVENOUS
  Filled 2015-05-15 (×11): qty 50

## 2015-05-15 MED ORDER — FENTANYL CITRATE (PF) 100 MCG/2ML IJ SOLN: 100.0000 ug | Freq: Once | INTRAMUSCULAR | Status: AC

## 2015-05-15 MED ORDER — ETOMIDATE 2 MG/ML IV SOLN
10.0000 mg | INTRAVENOUS | Status: AC
  Administered 2015-05-15: 10 mg via INTRAVENOUS

## 2015-05-15 MED ORDER — FENTANYL CITRATE (PF) 100 MCG/2ML IJ SOLN: 100.0000 ug | Freq: Once | INTRAMUSCULAR | Status: DC | PRN

## 2015-05-15 MED ORDER — HEPARIN (PORCINE) IN NACL 100-0.45 UNIT/ML-% IJ SOLN: 900.0000 [IU]/h | INTRAMUSCULAR | Status: DC

## 2015-05-15 MED ORDER — MIDAZOLAM HCL 2 MG/2ML IJ SOLN: 2.0000 mg | Freq: Once | INTRAMUSCULAR | Status: DC

## 2015-05-15 MED ORDER — PHENYLEPHRINE HCL 10 MG/ML IJ SOLN
0.0000 ug/min | INTRAVENOUS | Status: DC
  Administered 2015-05-15 (×2): 50 ug/min via INTRAVENOUS
  Administered 2015-05-16: 30 ug/min via INTRAVENOUS
  Filled 2015-05-15 (×4): qty 1

## 2015-05-15 MED ORDER — PROPOFOL 10 MG/ML IV BOLUS
20.0000 mg | Freq: Once | INTRAVENOUS | Status: AC
  Administered 2015-05-15: 20 mg via INTRAVENOUS

## 2015-05-15 MED ORDER — SODIUM CHLORIDE 0.9 % IV SOLN
1.0000 mg/h | INTRAVENOUS | Status: DC
  Administered 2015-05-15: 3 mg/h via INTRAVENOUS
  Administered 2015-05-16 – 2015-05-17 (×2): 2 mg/h via INTRAVENOUS
  Administered 2015-05-18: 5 mg/h via INTRAVENOUS
  Filled 2015-05-15 (×5): qty 10

## 2015-05-15 MED ORDER — SODIUM CHLORIDE 0.9 % IV SOLN
3.0000 ug/kg/min | INTRAVENOUS | Status: DC
  Administered 2015-05-15 – 2015-05-17 (×3): 3 ug/kg/min via INTRAVENOUS
  Filled 2015-05-15 (×3): qty 20

## 2015-05-15 MED ORDER — PROPOFOL 1000 MG/100ML IV EMUL
INTRAVENOUS | Status: AC
  Administered 2015-05-15: 1000 mg/kg/h
  Filled 2015-05-15: qty 100

## 2015-05-15 MED ORDER — ANTISEPTIC ORAL RINSE SOLUTION (CORINZ)
7.0000 mL | Freq: Four times a day (QID) | OROMUCOSAL | Status: DC
  Administered 2015-05-15 – 2015-05-26 (×43): 7 mL via OROMUCOSAL

## 2015-05-15 NOTE — Progress Notes (Signed)
eLink Physician-Brief Progress Note Patient Name: Julia Massey DOB: 1950-05-31 MRN: [id number]   Date of Service  05/15/2015  HPI/Events of Note  New Hypotension & malfunctioning arterial line but cuff correlating. Patient on FiO2 0.7.  eICU Interventions  1. NS 250cc bolus 2. Neo gtt to maintain MAP & SBP 3. A Resident (Dr. Dimple Casey) to come to bedside & evaluate patient     Intervention Category Major Interventions: Hypotension - evaluation and management  Lawanda Cousins 05/15/2015, 5:01 PM

## 2015-05-15 NOTE — Progress Notes (Signed)
BAL -bronch sample orders just received, sample labeled and walked to the lab w/ approp. Requisitions.

## 2015-05-15 NOTE — Progress Notes (Signed)
Vent changes made per MD (post ABG) and per ARDS protocol, RN aware (late entry note).

## 2015-05-15 NOTE — Procedures (Signed)
Arterial Catheter Insertion Procedure Note Julia Massey [id number] [birth year]  Procedure: Insertion of Arterial Catheter  Indications: Blood pressure monitoring and Frequent blood sampling  Procedure Details Consent: Risks of procedure as well as the alternatives and risks of each were explained to the (patient/caregiver).  Consent for procedure obtained. Time Out: Verified patient identification, verified procedure, site/side was marked, verified correct patient position, special equipment/implants available, medications/allergies/relevent history reviewed, required imaging and test results available.  Performed  Maximum sterile technique was used including antiseptics, cap, gloves, gown, hand hygiene, mask and sheet. Skin prep: Chlorhexidine; local anesthetic administered 20 gauge catheter was inserted into right femoral artery using the Seldinger technique.  Evaluation Blood flow good; BP tracing good Complications: No apparent complications.   Nelda Bucks 05/15/2015

## 2015-05-15 NOTE — Procedures (Signed)
Central Venous Catheter Insertion Procedure Note CHRISOULA ZEGARRA [id number] 30-Jun-1950  Procedure: Insertion of Central Venous Catheter Indications: Assessment of intravascular volume, Drug and/or fluid administration and Frequent blood sampling  Procedure Details Consent: Risks of procedure as well as the alternatives and risks of each were explained to the (patient/caregiver).  Consent for procedure obtained. Time Out: Verified patient identification, verified procedure, site/side was marked, verified correct patient position, special equipment/implants available, medications/allergies/relevent history reviewed, required imaging and test results available.  Performed  Maximum sterile technique was used including antiseptics, cap, gloves, gown, hand hygiene, mask and sheet. Skin prep: Chlorhexidine; local anesthetic administered A antimicrobial bonded/coated triple lumen catheter was placed in the left internal jugular vein using the Seldinger technique.  Evaluation Blood flow good Complications: No apparent complications Patient did tolerate procedure well. Chest X-ray ordered to verify placement.  CXR: pending.  Nelda Bucks 05/15/2015, 12:53 PM  [place]  Mcarthur Rossetti. Tyson Alias, MD, FACP Pgr: [phone] [place] [facility]

## 2015-05-15 NOTE — Procedures (Signed)
Bronchoscopy Procedure Note Julia Massey [id number] [birth year]  Procedure: Bronchoscopy Indications: Diagnostic evaluation of the airways, Obtain specimens for culture and/or other diagnostic studies and Remove secretions  Procedure Details Consent: Risks of procedure as well as the alternatives and risks of each were explained to the (patient/caregiver).  Consent for procedure obtained. Time Out: Verified patient identification, verified procedure, site/side was marked, verified correct patient position, special equipment/implants available, medications/allergies/relevent history reviewed, required imaging and test results available.  Performed  In preparation for procedure, patient was given 100% FiO2 and bronchoscope lubricated. Sedation: Etomidate  Airway entered and the following bronchi were examined: RUL, RML, RLL, LUL, LLL and Bronchi.   Procedures performed: Brushings performed Bronchoscope removed.  , Patient placed back on 100% FiO2 at conclusion of procedure.    Evaluation Hemodynamic Status: BP stable throughout; O2 sats: stable throughout Patient's Current Condition: unstable Specimens:  Sent serosanguinous fluid Complications: No apparent complications Patient did tolerate procedure well.   Nelda Bucks. 05/15/2015   1. Min secretions 2. BAL RML 3. No obstruction secretions  Mcarthur Rossetti. Tyson Alias, MD, FACP Pgr: [phone] [facility]

## 2015-05-15 NOTE — Progress Notes (Signed)
Pt intubated by Dr Florian Buff, + easy cap color change, + [facility]BS[facility] clear coarse = t/o.  Immediately post intubation MD proceeded w/ bronchoscopy, and A-line.  See MD notes.  Pt placed on vent settings per MD, per ARDS protocol.  Sputum sample from bronch was left w/ RN d/t waiting on MD to place sputum sample orders.

## 2015-05-15 NOTE — Progress Notes (Signed)
Panic ABG results given to Dr. Allena Katz, increased rate to 35.  Per MD, leave pt on 7 ml/kg for now. Sat 99%

## 2015-05-15 NOTE — Progress Notes (Signed)
Patient ID: Julia Massey, female   DOB: 1950-09-09, 65 y.o.   MRN: [id number] Additional management  I assessed echo, PA about 50, rv big ,but some kinetic, LV wnl Hypoxia  Consider CT angio at this time with Chest assessment Heparin empiric for now Send autoimmune assessment ARDS and r/o pe Send antiphosp and autoimmune also Await bronch Updated family extensive Ccm time now 90 min   Mcarthur Rossetti. Tyson Alias, MD, FACP Pgr: [phone] [place]

## 2015-05-15 NOTE — Progress Notes (Signed)
ANTICOAGULATION CONSULT NOTE - Initial Consult  Pharmacy Consult for heparin Indication: pulmonary embolus  Not on File  Patient Measurements: Height: 5' (152.4 cm) Weight: 119 lb 7.8 oz (54.2 kg) IBW/kg (Calculated) : 45.5  Vital Signs: Temp: 97.9 F (36.6 C) (01/05 0755) Temp Source: Oral (01/05 0755) BP: 149/91 mmHg (01/05 0900) Pulse Rate: 90 (01/05 1330)  Labs:  Recent Labs  05/14/15 0334 05/15/15 0228  HGB 11.2* 11.9*  HCT 34.0* 36.6  PLT 446* 429*  CREATININE 0.43* 0.57  TROPONINI 0.15*  --     Estimated Creatinine Clearance: 52.4 mL/min (by C-G formula based on Cr of 0.57).   Medical History: No past medical history on file.  Medications:  Prescriptions prior to admission  Medication Sig Dispense Refill Last Dose  . cetirizine (ZYRTEC) 10 MG tablet Take 10 mg by mouth daily as needed for allergies.   Past Month at Unknown time  . Cyanocobalamin ([employer] B-12) 1000 MCG/15ML LIQD Take 15 mLs by mouth daily.   05/13/2015 at Unknown time  . FLUoxetine (PROZAC) 10 MG capsule Take 40 mg by mouth daily.   05/13/2015 at Unknown time  . HYDROcodone-acetaminophen (NORCO) 7.5-325 MG tablet Take 1 tablet by mouth every 6 (six) hours as needed. pain   05/13/2015 at Unknown time  . Multiple Vitamin (MULTIVITAMIN) tablet Take 1 tablet by mouth daily.   05/13/2015 at Unknown time  . naproxen sodium (ANAPROX) 220 MG tablet Take 220 mg by mouth 2 (two) times daily as needed (pain).   Past Month at Unknown time  . Pseudoeph-Doxylamine-DM-APAP (NYQUIL MULTI-SYMPTOM PO) Take 1 capsule by mouth at bedtime as needed (cold symptoms).   Past Month at Unknown time  . pseudoephedrine (SUDAFED) 30 MG tablet Take 30 mg by mouth every 4 (four) hours as needed for congestion.   Past Month at Unknown time    Assessment: 65 yo F admitted 05/14/2015 From [facility] with productive cough and fevers. Pharmacy consulted to start heparin for pulmonary embolism. Patient last dose of heparin SQ  was at 0500 1/5.  H/H stable, Plt 429, no s/sx of bleeding noted.  Goal of Therapy:  Heparin level 0.3-0.7 units/ml Monitor platelets by anticoagulation protocol: Yes   Plan:  Give 3000 units bolus x 1 Start heparin infusion at 900 units/hr Check anti-Xa level in 6 hours and daily while on heparin Continue to monitor H&H and platelets  Belinda Fisher Moksha Dorgan 05/15/2015,2:30 PM

## 2015-05-15 NOTE — Progress Notes (Signed)
  Echocardiogram 2D Echocardiogram has been performed.  Julia Massey 05/15/2015, 1:53 PM

## 2015-05-15 NOTE — Progress Notes (Signed)
Patient received 250cc NS bolus and started Neo gtt 50 mcg/m with MAP improved from 48-50 to >60 at 10 minutes. Good correlation noted on BP cuff and A-line despite mediocre waveform. Exam unchanged from previous RRR with normal heart sounds, bilateral breath sounds coarse and ventilator.  Case and CTA reviewed showing extensive ground glass opacities consistent with acute pneumonitis on top of chronic severe lung disease. Increase IV solumedrol 80mg  q6hrs and repeat ABGs q6hrs.  Additional 1L NS bolus to be administered within next hour. Repeat CVP q4hrs.

## 2015-05-15 NOTE — Progress Notes (Signed)
I was in CT scan w/ another pt when ABG was resulted.  PER Dr Dimple Casey, leave rate/vT unchanged & continue to wean fio2/peep per [facility] protocol.

## 2015-05-15 NOTE — Progress Notes (Signed)
   05/15/15 0345  BiPAP/CPAP/SIPAP  BiPAP/CPAP/SIPAP Pt Type Adult  Mask Type Full face mask  Mask Size Medium  Set Rate 12 breaths/min  Respiratory Rate 32 breaths/min  IPAP 14 cmH20  EPAP 6 cmH2O  Oxygen Percent 80 %  Flow Rate 0 lpm  Minute Ventilation 20.1  Leak 7  Peak Inspiratory Pressure (PIP) 15  Tidal Volume (Vt) 568  BiPAP/CPAP/SIPAP BiPAP  Patient Home Equipment No  Auto Titrate No  BiPAP/CPAP /SiPAP Vitals  Temp 99.3 F (37.4 C)  Pulse Rate (!) 113  Resp (!) 32

## 2015-05-15 NOTE — H&P (Signed)
PULMONARY / CRITICAL CARE MEDICINE   Name: Julia Massey MRN: [id number] DOB: 1950/12/14    ADMISSION DATE:  05/14/2015 CONSULTATION DATE:  05/13/14  REFERRING MD:  [facility]  CHIEF COMPLAINT:  SOB  HISTORY OF PRESENT ILLNESS:  Julia Massey is a 65 y.o. female with PMH of HLD, anxiety, depression, and COPD (never had formal PFT's, was only told she had COPD per CXR report).  She began to feel sick around christmas time 2016 with cough productive of thick green sputum and fevers (Tmax 102) / chills.  She saw her PCP who placed her on a z- pack which she completed 2 days ago.  She apparently was initially feeling somewhat better, then began to have worsening SOB 1 day ago.  She continued to have cough productive of thick green sputum and had some mild chest pain that was mostly associated with coughing.    She is a former smoker, quit some 2 months ago per her report.  Prior to that she had a roughly 40 - 50 pack year history.  She reports she was told a few years ago that she had COPD based off CXR but she has never been on any treatments and has never seen a pulmonologist.  On 01/04, she went to [facility] where she was found to be hypoxic to the point that she required BiPAP (was 73% on RA).  Trial off BiPAP was attempted, but she unfortunately failed.  CTA was obtained which revealed extensive bilateral patchy infiltrates c/w atypical PNA.  Additional labs noteable for WBC 20.8 (85% neut's), K 3.2, BNP 1252, Trop 0.039, lactic acid 2.5.  Flu PCR was negative.   SUBJECTIVE: She currently denies difficulty breathing, chest pain.  Feels comfortable on NRB.  VITAL SIGNS: BP 131/94 mmHg  Pulse 115  Temp(Src) 97.9 F (36.6 C) (Oral)  Resp 41  Ht 5' (1.524 m)  Wt 119 lb 7.8 oz (54.2 kg)  BMI 23.34 kg/m2  SpO2 96%  HEMODYNAMICS:    VENTILATOR SETTINGS:    INTAKE / OUTPUT: I/O last 3 completed shifts: In: 3092.5 [I.V.:2030; IV  Piggyback:1062.5] Out: 2875 [Urine:2875]   PHYSICAL EXAMINATION: General: Adult female, in NAD, on NRB. Neuro: A&O x 3, non-focal.  HEENT: [place]/AT. PERRL, sclerae anicteric. Cardiovascular: RRR, no M/R/G.  Lungs: Respirations even and unlabored.  CTA bilaterally, No W/R/R. Abdomen: soft, NT/ND, +BS.  Musculoskeletal: No gross deformities, no edema.  Skin: Intact, warm, no rashes.  LABS:  BMET  Recent Labs Lab 05/14/15 0334 05/15/15 0228  NA 142 140  K 3.4* 4.2  CL 107 103  CO2 23 27  BUN 8 14  CREATININE 0.43* 0.57  GLUCOSE 152* 137*    Electrolytes  Recent Labs Lab 05/14/15 0334 05/15/15 0228  CALCIUM 8.8* 8.9    CBC  Recent Labs Lab 05/14/15 0334 05/15/15 0228  WBC 18.6* 21.3*  HGB 11.2* 11.9*  HCT 34.0* 36.6  PLT 446* 429*    Coag's No results for input(s): APTT, INR in the last 168 hours.  Sepsis Markers  Recent Labs Lab 05/14/15 0334  LATICACIDVEN 2.0  PROCALCITON 0.10    ABG No results for input(s): PHART, PCO2ART, PO2ART in the last 168 hours.  Liver Enzymes  Recent Labs Lab 05/14/15 0334  AST 27  ALT 16  ALKPHOS 119  BILITOT 0.6  ALBUMIN 2.4*    Cardiac Enzymes  Recent Labs Lab 05/14/15 0334  TROPONINI 0.15*    Glucose  Recent Labs Lab 05/14/15  9373  GLUCAP 142*    Imaging No results found.   STUDIES:  CTA 01/03 > no PE.  Extensive bilateral patchy pulmonary infiltrates c/w atypical PNA.  Additional peripheral pulmonary fibrosis at both lung bases.  Mediastinal and hilar adenopathy, likely reactive.  CULTURES: Blood 01/04 > Urine 01/04 >  Sputum 01/04 > U. Strep 01/04 >neg U. Legionella 01/04 >  ANTIBIOTICS: Vanc 01/04 >>> Zosyn 01/04 >>> Azithro 01/04 >>>1/5 Levofloxacin 1/5>>>  SIGNIFICANT EVENTS: 01/04 > admitted with acute hypoxic respiratory failure and concern for atypical PNA. 1/5- distress , on bipap  ASSESSMENT / PLAN:  PULMONARY A: Acute hypoxic respiratory failure with bilateral  infiltrates on CTA - concerning for atypical PNA vs post viral pneumonitis vs unlikely volume Former smoker. P:   Continue supplemental O2 as needed to maintain SpO2 > 92%. Empiric abx as above (vanc / zosyn / azithro). Assess PCT CXR in AM ET required, not a good long term bipap candidate , she is failing Then ards , plat goals less 30 Consider bronch Consider CT chest  INFECTIOUS A:   Concern for atypical PNA vs post viral pneumonitis (pct low) R/o viral panel / flu P:   Abx as above (vanc / zosyn / azithro).  Follow cultures as above. PCT algorithm to limit abx exposure. Chabge azithro to levofloxacin Send resp viral panel, isolate Consider bronch bal  CARDIOVASCULAR A:  Elevated BNP (1252) - no hx of heart failure, not volume overloaded on exam. Mild troponin leak. Hx HLD. P:  Lasix dc Consider echo. Continue outpatient crestor. Consider line and cvp  RENAL A:   Hypokalemia. P:   K PO. NS @ 75. BMP in AM.get cvp  GASTROINTESTINAL A:   Nutrition. P:   NPO for now. Hold TF After ETT , stable, ct, then start feed   HEMATOLOGIC A:   VTE Prophylaxis. P:  SCD's / Heparin. CBC in AM.  ENDOCRINE A:   No known issues.   P:   Monitor glucose on BMP.  NEUROLOGIC A:   Hx anxiety / depression. P:   Continue outpatient fluoxetine. Hold outpatient norco.   STAFF NOTE: I, Rory Percy, MD FACP have personally reviewed patient's available data, including medical history, events of note, physical examination and test results as part of my evaluation. I have discussed with resident/NP and other care providers such as pharmacist, RN and RRT. In addition, I personally evaluated patient and elicited key findings of: distress on bipap on high flow, she requires immediate intubation, pcxr c/w infiltrates, differential, new PNA, viral pneumonitis, ARDS post viral, change azithro to levofloxacin, , keep plat less 30, echo needed, for bronch planned, also  recent tooth infection will ct mandle now, place line, a line, get  Cvp, i updated duaghter in full The patient is critically ill with multiple organ systems failure and requires high complexity decision making for assessment and support, frequent evaluation and titration of therapies, application of advanced monitoring technologies and extensive interpretation of multiple databases.   Critical Care Time devoted to patient care services described in this note is 50 Minutes. This time reflects time of care of this signee: Rory Percy, MD FACP. This critical care time does not reflect procedure time, or teaching time or supervisory time of PA/NP/Med student/Med Resident etc but could involve care discussion time. Rest per NP/medical resident whose note is outlined above and that I agree with   Mcarthur Rossetti. Tyson Alias, MD, FACP Pgr: [phone] [facility] 05/15/2015 10:40  AM

## 2015-05-15 NOTE — Progress Notes (Signed)
CRITICAL VALUE ALERT  Critical value received:  Ph 7.22, pco2= 61.1, po2= 229, HCO3= 24.2  Date of notification:  05/15/15  Time of notification:  1525  Critical value read back:Yes.    Nurse who received alert:  Tory Emerald, RN  MD notified (1st page):  Dr Yetta Barre  Time of first page:  1530  MD notified (2nd page):  Time of second page:  Responding MD:  Dr Yetta Barre  Time MD responded:  [phone]

## 2015-05-15 NOTE — Procedures (Signed)
Intubation Procedure Note FENNA SEMEL [id number] [birth year]  Procedure: Intubation Indications: Respiratory insufficiency  Procedure Details Consent: Risks of procedure as well as the alternatives and risks of each were explained to the (patient/caregiver).  Consent for procedure obtained. Time Out: Verified patient identification, verified procedure, site/side was marked, verified correct patient position, special equipment/implants available, medications/allergies/relevent history reviewed, required imaging and test results available.  Performed  Maximum sterile technique was used including antiseptics, cap, gloves, gown, hand hygiene, mask and sheet.  MAC and 3    Evaluation Hemodynamic Status: BP stable throughout; O2 sats: stable throughout Patient's Current Condition: stable Complications: No apparent complications Patient did tolerate procedure well. Chest X-ray ordered to verify placement.  CXR: pending.   Nelda Bucks 05/15/2015

## 2015-05-16 ENCOUNTER — Inpatient Hospital Stay (HOSPITAL_COMMUNITY): Payer: BLUE CROSS/BLUE SHIELD

## 2015-05-16 DIAGNOSIS — J9601 Acute respiratory failure with hypoxia: Secondary | ICD-10-CM

## 2015-05-16 DIAGNOSIS — J80 Acute respiratory distress syndrome: Secondary | ICD-10-CM

## 2015-05-16 DIAGNOSIS — J849 Interstitial pulmonary disease, unspecified: Secondary | ICD-10-CM | POA: Diagnosis present

## 2015-05-16 LAB — BASIC METABOLIC PANEL
Anion gap: 8 (ref 5–15)
BUN: 15 mg/dL (ref 6–20)
CHLORIDE: 108 mmol/L (ref 101–111)
CO2: 24 mmol/L (ref 22–32)
Calcium: 8.2 mg/dL — ABNORMAL LOW (ref 8.9–10.3)
Creatinine, Ser: 0.42 mg/dL — ABNORMAL LOW (ref 0.44–1.00)
GFR calc Af Amer: >60 mL/min (ref >60)
GLUCOSE: 153 mg/dL — AB (ref 65–99)
POTASSIUM: 3.9 mmol/L (ref 3.5–5.1)
Sodium: 140 mmol/L (ref 135–145)

## 2015-05-16 LAB — POCT I-STAT 3, ART BLOOD GAS (G3+)
ACID-BASE DEFICIT: 2 mmol/L (ref 0.0–2.0)
ACID-BASE DEFICIT: 1 mmol/L (ref 0.0–2.0)
ACID-BASE DEFICIT: 3 mmol/L — AB (ref 0.0–2.0)
Acid-base deficit: 2 mmol/L (ref 0.0–2.0)
Acid-base deficit: 2 mmol/L (ref 0.0–2.0)
BICARBONATE: 25 meq/L — AB (ref 20.0–24.0)
BICARBONATE: 26.2 meq/L — AB (ref 20.0–24.0)
BICARBONATE: 26.6 meq/L — AB (ref 20.0–24.0)
Bicarbonate: 27.6 mEq/L — ABNORMAL HIGH (ref 20.0–24.0)
Bicarbonate: 26.1 mEq/L — ABNORMAL HIGH (ref 20.0–24.0)
Bicarbonate: 25.5 mEq/L — ABNORMAL HIGH (ref 20.0–24.0)
O2 SAT: 92 %
O2 SAT: 95 %
O2 Saturation: 100 %
O2 Saturation: 100 %
O2 Saturation: 95 %
O2 Saturation: 94 %
PCO2 ART: 71.2 mmHg — AB (ref 35.0–45.0)
PCO2 ART: 70.6 mmHg — AB (ref 35.0–45.0)
PCO2 ART: 52.5 mmHg — AB (ref 35.0–45.0)
PH ART: 7.332 — AB (ref 7.350–7.450)
PO2 ART: 189 mmHg — AB (ref 80.0–100.0)
PO2 ART: 80 mmHg (ref 80.0–100.0)
PO2 ART: 84 mmHg (ref 80.0–100.0)
Patient temperature: 97.3
TCO2: 30 mmol/L (ref 0–100)
TCO2: 27 mmol/L (ref 0–100)
TCO2: 28 mmol/L (ref 0–100)
TCO2: 27 mmol/L (ref 0–100)
TCO2: 28 mmol/L (ref 0–100)
TCO2: 28 mmol/L (ref 0–100)
pCO2 arterial: 49.9 mmHg — ABNORMAL HIGH (ref 35.0–45.0)
pCO2 arterial: 58.3 mmHg (ref 35.0–45.0)
pCO2 arterial: 47.7 mmHg — ABNORMAL HIGH (ref 35.0–45.0)
pH, Arterial: 7.194 — CL (ref 7.350–7.450)
pH, Arterial: 7.305 — ABNORMAL LOW (ref 7.350–7.450)
pH, Arterial: 7.255 — ABNORMAL LOW (ref 7.350–7.450)
pH, Arterial: 7.174 — CL (ref 7.350–7.450)
pH, Arterial: 7.309 — ABNORMAL LOW (ref 7.350–7.450)
pO2, Arterial: 252 mmHg — ABNORMAL HIGH (ref 80.0–100.0)
pO2, Arterial: 86 mmHg (ref 80.0–100.0)
pO2, Arterial: 74 mmHg — ABNORMAL LOW (ref 80.0–100.0)

## 2015-05-16 LAB — CULTURE, RESPIRATORY: CULTURE: NORMAL

## 2015-05-16 LAB — CBC
HEMATOCRIT: 29.2 % — AB (ref 36.0–46.0)
HEMOGLOBIN: 9.1 g/dL — AB (ref 12.0–15.0)
MCH: 30 pg (ref 26.0–34.0)
MCHC: 31.2 g/dL (ref 30.0–36.0)
MCV: 96.4 fL (ref 78.0–100.0)
Platelets: 283 10*3/uL (ref 150–400)
RBC: 3.03 MIL/uL — AB (ref 3.87–5.11)
RDW: 13.7 % (ref 11.5–15.5)
WBC: 10.9 10*3/uL — AB (ref 4.0–10.5)

## 2015-05-16 LAB — PATHOLOGIST SMEAR REVIEW

## 2015-05-16 LAB — GLUCOSE, CAPILLARY
GLUCOSE-CAPILLARY: 128 mg/dL — AB (ref 65–99)
GLUCOSE-CAPILLARY: 160 mg/dL — AB (ref 65–99)
GLUCOSE-CAPILLARY: 144 mg/dL — AB (ref 65–99)
Glucose-Capillary: 116 mg/dL — ABNORMAL HIGH (ref 65–99)
Glucose-Capillary: 129 mg/dL — ABNORMAL HIGH (ref 65–99)
Glucose-Capillary: 137 mg/dL — ABNORMAL HIGH (ref 65–99)

## 2015-05-16 LAB — MPO/PR-3 (ANCA) ANTIBODIES: ANCA Proteinase 3: <3.5 U/mL (ref 0.0–3.5)

## 2015-05-16 LAB — HIV ANTIBODY (ROUTINE TESTING W REFLEX): HIV Screen 4th Generation wRfx: NONREACTIVE

## 2015-05-16 LAB — RHEUMATOID FACTOR: Rhuematoid fact SerPl-aCnc: 14.1 IU/mL — ABNORMAL HIGH (ref 0.0–13.9)

## 2015-05-16 LAB — C4 COMPLEMENT: Complement C4, Body Fluid: 30 mg/dL (ref 14–44)

## 2015-05-16 LAB — C3 COMPLEMENT: C3 Complement: 159 mg/dL (ref 82–167)

## 2015-05-16 LAB — ANTINUCLEAR ANTIBODIES, IFA: ANA Ab, IFA: NEGATIVE

## 2015-05-16 LAB — PROCALCITONIN: Procalcitonin: 0.11 ng/mL

## 2015-05-16 MED ORDER — PRO-STAT SUGAR FREE PO LIQD
30.0000 mL | Freq: Two times a day (BID) | ORAL | Status: DC
  Administered 2015-05-16 – 2015-05-22 (×13): 30 mL
  Filled 2015-05-16 (×16): qty 30

## 2015-05-16 MED ORDER — OXEPA PO LIQD
1000.0000 mL | ORAL | Status: DC
  Administered 2015-05-17 – 2015-05-21 (×5): 1000 mL
  Filled 2015-05-16 (×8): qty 1000

## 2015-05-16 MED ORDER — WHITE PETROLATUM GEL
Status: AC
  Administered 2015-05-16: 0.2
  Filled 2015-05-16: qty 1

## 2015-05-16 MED ORDER — OXEPA PO LIQD: 1000.0000 mL | ORAL | Status: DC

## 2015-05-16 MED ORDER — SODIUM BICARBONATE 8.4 % IV SOLN
INTRAVENOUS | Status: DC
  Administered 2015-05-16: 16:00:00 via INTRAVENOUS
  Filled 2015-05-16 (×2): qty 150

## 2015-05-16 MED ORDER — OXEPA PO LIQD
1000.0000 mL | ORAL | Status: DC
  Administered 2015-05-16: 1000 mL
  Filled 2015-05-16 (×2): qty 1000

## 2015-05-16 NOTE — Progress Notes (Signed)
Nutrition Follow-up  DOCUMENTATION CODES:   Non-severe (moderate) malnutrition in context of acute illness/injury  INTERVENTION:    Continue TF via OGT with Oxepa change goal rate to 35 ml/h (840 ml per day) with Prostat 30 ml BID to provide 1460 kcals, 83 gm protein, 659 ml free water daily.  NUTRITION DIAGNOSIS:   Inadequate oral intake related to inability to eat as evidenced by NPO status.  Ongoing  GOAL:   Patient will meet greater than or equal to 90% of their needs  Unmet  MONITOR:   Vent status, Labs, Weight trends, TF tolerance, I & O's  REASON FOR ASSESSMENT:   Consult Enteral/tube feeding initiation and management  ASSESSMENT:   65 y.o. female with PMH of HLD, anxiety, depression, and COPD (never had formal PFT's, was only told she had COPD per CXR report). She began to feel sick around christmas time 2016 with cough productive of thick green sputum and fevers (Tmax 102) / chills. She saw her PCP who placed her on a z- pack which she completed 2 days ago. She apparently was initially feeling somewhat better, then began to have worsening SOB 1 day ago.  Discussed patient in ICU rounds and with RN today. Received MD Consult for TF initiation and management.  Patient is currently intubated on ventilator support MV: 11.4 L/min Temp (24hrs), Avg:98.3 F (36.8 C), Min:96.8 F (36 C), Max:100.6 F (38.1 C)   Diet Order:  Diet NPO time specified  Skin:  Reviewed, no issues  Last BM:  1/3  Height:   Ht Readings from Last 5 Encounters:  05/16/15 5' (1.524 m)    Weight:   Wt Readings from Last 1 Encounters:  05/16/15 125 lb 3.5 oz (56.8 kg)    Ideal Body Weight:  45.5 kg  BMI:  Body mass index is 24.46 kg/(m^2).  Estimated Nutritional Needs:   Kcal:  1516  Protein:  80-90 gm  Fluid:  1.6 L  EDUCATION NEEDS:   No education needs identified at this time  Joaquin Courts, RD, LDN, CNSC Pager [phone] After Hours Pager [phone]

## 2015-05-16 NOTE — Progress Notes (Signed)
PULMONARY / CRITICAL CARE MEDICINE   Name: Julia Massey MRN: [id number] DOB: Jan 19, 1951    ADMISSION DATE:  05/14/2015 CONSULTATION DATE:  05/13/14  REFERRING MD:  [facility]   CHIEF COMPLAINT:  SOB  HISTORY OF PRESENT ILLNESS:  Julia Massey is a 65 y.o. female with PMH of HLD, anxiety, depression, and COPD (never had formal PFT's, was only told she had COPD per CXR report).  Recent URI treated with z-pack. She apparently was initially feeling somewhat better, then began to have worsening SOB. Continued to have cough productive of thick green sputum and had some mild chest pain associated with coughing.    On 01/04, she went to [facility] where she was found to be hypoxic to the point that she required BiPAP (was 73% on RA).  Trial off BiPAP was attempted, but she unfortunately failed.  CTA was obtained which revealed extensive bilateral patchy infiltrates c/w atypical PNA.  Additional labs noteable for WBC 20.8 (85% neut's), K 3.2, BNP 1252, Trop 0.039, lactic acid 2.5.  Flu PCR was negative.   SUBJECTIVE: hypotensive overnight, given 250cc NS bolus, started Neo gtt to maintain MAP  VITAL SIGNS: BP 86/64 mmHg  Pulse 71  Temp(Src) 97.6 F (36.4 C) (Oral)  Resp 35  Ht 5' (1.524 m)  Wt 125 lb 3.5 oz (56.8 kg)  BMI 24.46 kg/m2  SpO2 95%  HEMODYNAMICS: CVP:  [9 mmHg] 9 mmHg  VENTILATOR SETTINGS: Vent Mode:  [-] PRVC FiO2 (%):  [40 %-90 %] 40 % Set Rate:  [20 bmp-35 bmp] 35 bmp Vt Set:  [320 mL-370 mL] 320 mL PEEP:  [8 cmH20-14 cmH20] 8 cmH20 Plateau Pressure:  [20 cmH20-37 cmH20] 25 cmH20  INTAKE / OUTPUT: I/O last 3 completed shifts: In: 5822.1 [I.V.:3552.1; Other:10; NG/GT:60; IV Piggyback:2200] Out: 2270 [Urine:2020; Emesis/NG output:250]   PHYSICAL EXAMINATION: General: Adult female, sedated, on mechanical vent Neuro: sedated HEENT: [place]/AT. PERRL Cardiovascular: distant sounds RRR, no M/R/G.  Lungs: clear breath sounds  bilaterally on ventilator Abdomen: soft, non-distended, +BS.  Musculoskeletal: No gross deformities, no edema, SCDs in place.  Skin: Intact  LABS:  BMET  Recent Labs Lab 05/14/15 0334 05/15/15 0228 05/16/15 0530  NA 142 140 140  K 3.4* 4.2 3.9  CL 107 103 108  CO2 23 27 24   BUN 8 14 15   CREATININE 0.43* 0.57 0.42*  GLUCOSE 152* 137* 153*    Electrolytes  Recent Labs Lab 05/14/15 0334 05/15/15 0228 05/16/15 0530  CALCIUM 8.8* 8.9 8.2*    CBC  Recent Labs Lab 05/14/15 0334 05/15/15 0228 05/16/15 0530  WBC 18.6* 21.3* 10.9*  HGB 11.2* 11.9* 9.1*  HCT 34.0* 36.6 29.2*  PLT 446* 429* 283    Coag's  Recent Labs Lab 05/15/15 1425  APTT 33  INR 1.31    Sepsis Markers  Recent Labs Lab 05/14/15 0334 05/16/15 0530  LATICACIDVEN 2.0  --   PROCALCITON 0.10 0.11    ABG  Recent Labs Lab 05/15/15 1519 05/15/15 1700 05/16/15 0241  PHART 7.221* 7.282* 7.305*  PCO2ART 61.1* 52.2* 49.9*  PO2ART 229* 190* 189.0*    Liver Enzymes  Recent Labs Lab 05/14/15 0334  AST 27  ALT 16  ALKPHOS 119  BILITOT 0.6  ALBUMIN 2.4*    Cardiac Enzymes  Recent Labs Lab 05/14/15 0334  TROPONINI 0.15*    Glucose  Recent Labs Lab 05/14/15 0338 05/15/15 1624 05/15/15 1931 05/15/15 2350 05/16/15 0339  GLUCAP 142* 109* 111* 129* 137*  Imaging Dg Abd 1 View  05/15/2015  CLINICAL DATA:  Orogastric tube placement EXAM: ABDOMEN - 1 VIEW COMPARISON:  None. FINDINGS: Orogastric tube tip and side port are in the stomach. There is no bowel dilatation or air-fluid level suggesting obstruction. No free air. A right femoral catheter has its tip in the mid lateral right pelvis. A rectal thermometer is present. IMPRESSION: Orogastric tube tip and side port in stomach. Bowel gas pattern unremarkable. Electronically Signed   By: Bretta Bang III M.D.   On: 05/15/2015 12:46   Ct Soft Tissue Neck Wo Contrast  05/15/2015  CLINICAL DATA:  Hypoxic respiratory  failure. History of tooth abscess. EXAM: CT NECK WITHOUT CONTRAST TECHNIQUE: Multidetector CT imaging of the neck was performed following the standard protocol without intravenous contrast. COMPARISON:  CT chest reported separately. FINDINGS: Patient is intubated. Assessment of pharyngeal mass is and mucosal surfaces is limited. No retropharyngeal or parapharyngeal abscess is observed. No asymmetric neck masses are evident. Major and minor salivary glands unremarkable. Cervical lymph node prominence incompletely evaluated, could be reactive. Carotid atherosclerosis. Cervical spondylosis. Large central disc extrusion at C5-6 would be expected to cause stenosis. There is degenerative slip at C4-5 of 2-3 mm related to facet disease. No intraspinal mass lesion is observed other than the disc herniation. A few small air bubbles are seen along the posterior margin of the LEFT sternocleidomastoid. These are favored to be iatrogenic, related to central venous line placement in the LEFT IJ. Item there is no visible pneumothorax. TMJs are located but there is significant periodontal disease affecting the mandibular greater than maxillary teeth. I am not able to establish a definite finding of mandibular osteomyelitis. No sinus air-fluid level. Pulmonary opacities will be described separately. Negative intracranial compartment. Negative orbits. IMPRESSION: Significant periodontal disease. No features suggestive of mandibular osteomyelitis. Patient intubated. Assessment of pharyngeal masses and mucosal surfaces is limited but no retropharyngeal or parapharyngeal abscess is observed. Cervical lymph node prominence is incompletely evaluated. No suppurative lymph node mass is evident. Electronically Signed   By: Elsie Stain M.D.   On: 05/15/2015 16:29   Ct Angio Chest Pe W/cm &/or Wo Cm  05/15/2015  CLINICAL DATA:  66 year old with productive cough which began approximately 2 weeks ago associated with fevers and chills,  improved on antibiotic therapy which she completed approximately 2 days ago. Patient became acutely worse with shortness of breath and continued productive cough yesterday. Former smoker. Patient presented at [facility] yesterday and was found to be severely hypoxic. Patient transferred to [facility] for further management. Patient acutely ill in the intensive care unit and has been intubated. EXAM: CT ANGIOGRAPHY CHEST WITH CONTRAST TECHNIQUE: Multidetector CT imaging of the chest was performed using the standard protocol during bolus administration of intravenous contrast. Multiplanar CT image reconstructions and MIPs were obtained to evaluate the vascular anatomy. CONTRAST:  OMNIPAQUE IOHEXOL 350 MG/ML IV. COMPARISON:  No prior CT. Chest x-rays earlier today and previously. FINDINGS: Technical quality: Good. Respiratory motion blurs images of the bases. Pulmonary embolism:  Absent. Cardiovascular: Heart mildly to moderately enlarged with right atrial enlargement in particular. Right ventricular hypertrophy. Mild left ventricular hypertrophy. Mild left main and LAD coronary atherosclerosis. No pericardial effusion. Mild atherosclerosis involving the thoracic and upper abdominal aorta without aneurysm. Mild atherosclerosis at the origin of the left subclavian artery without stenosis. Mediastinum/Lymph Nodes: Mildly enlarged precarinal (station 4R) lymph node measuring approximately 1.8 x 2.7 x 1.9 cm. Enlarged station 2R lymph nodes, the largest  measuring approximately 2.0 x 1.6 x 1.7 cm. Mildly enlarged AP window (station 5) lymph nodes. Upper normal sized right hilar lymph nodes. Endotracheal tube tip terminates approximately 2.5 cm above the carina. Normal-appearing esophagus. Visualized thyroid is unremarkable. Lungs/Pleura: Extensive ground-glass opacity throughout both lungs with resulting mosaic attenuation. Scattered confluent airspace opacities peripherally in both lungs. No  pulmonary parenchymal nodules or masses. No pleural effusions. Central airways patent without significant bronchial wall thickening. Upper abdomen: Nasogastric tube looped in the stomach. Visualized upper abdomen unremarkable for the early arterial phase of enhancement. Musculoskeletal: Degenerative disc disease and spondylosis involving the mid thoracic spine. No acute osseous findings. Review of the MIP images confirms the above findings. IMPRESSION: 1. No evidence pulmonary embolism. 2. Cardiomegaly with right atrial enlargement, mild left ventricular hypertrophy and right ventricular hypertrophy. Mild left main and LAD coronary atherosclerosis. 3. Extensive ground-glass opacity throughout both lungs with scattered confluent airspace opacities peripherally in both lungs. ARDS suspected. The peripheral airspace opacities raise the possibility eosinophilic pneumonia. 4. Likely reactive mediastinal and right hilar lymphadenopathy. Electronically Signed   By: Hulan Saas M.D.   On: 05/15/2015 16:31   Dg Chest Port 1 View  05/15/2015  CLINICAL DATA:  Hypoxia EXAM: PORTABLE CHEST 1 VIEW COMPARISON:  May 14, 2015 FINDINGS: Endotracheal tube tip is 2.8 cm above the carina. Nasogastric tube tip and side port are below the diaphragm. Central catheter tip is in the superior vena cava. No pneumothorax. There is generalized interstitial edema as well as patchy alveolar edema throughout both lungs, not significantly changed from 1 day prior. Heart is upper normal in size. There is mild pulmonary venous hypertension. No adenopathy. IMPRESSION: Tube and catheter positions as described without apparent pneumothorax. Generalized edema, similar to 1 day prior. Stable cardiac prominence. Appearance is most consistent with congestive heart failure, although patchy superimposed pneumonia cannot be excluded radiographically. Electronically Signed   By: Bretta Bang III M.D.   On: 05/15/2015 12:48     STUDIES:  CTA  01/03 > no PE.  Extensive bilateral patchy pulmonary infiltrates c/w atypical PNA.  Additional peripheral pulmonary fibrosis at both lung bases.  Mediastinal and hilar adenopathy, likely reactive. CTA 01/05 >>  1. No evidence pulmonary embolism.  2. Cardiomegaly with right atrial enlargement, mild left ventricular  hypertrophy and right ventricular hypertrophy. Mild left main and  LAD coronary atherosclerosis.  3. Extensive ground-glass opacity throughout both lungs with  scattered confluent airspace opacities peripherally in both lungs.  ARDS suspected. The peripheral airspace opacities raise the  possibility eosinophilic pneumonia.  4. Likely reactive mediastinal and right hilar lymphadenopathy. CT neck 01/05 > Significant periodontal disease. No features suggestive of mandibular osteomyelitis. Assessment of pharyngeal masses and mucosal surfaces is limited but no retropharyngeal or parapharyngeal abscess is observed. Cervical lymph node prominence is incompletely evaluated. No suppurative lymph node mass is evident  TTE 01/05:  - Left ventricle: The cavity size was normal. Wall thickness was normal. Systolic function was normal. The estimated ejection fraction was in the range of 55% to 60%. Wall motion was normal; there were no regional wall motion abnormalities. Doppler parameters are consistent with abnormal left ventricular relaxation (grade 1 diastolic dysfunction). - Ventricular septum: The contour showed diastolic flattening and systolic flattening. - Right ventricle: The cavity size was moderately dilated. Systolic function was severely reduced. - Right atrium: The atrium was mildly dilated. - Pulmonary arteries: Systolic pressure was moderately increased. PA peak pressure: 52 mm Hg (S). - Pericardium, extracardiac: A trivial pericardial  effusion was identified.   CULTURES: Flu dec 28th>>>neg Blood 01/04 > Urine 01/04 > neg Sputum 01/04 > few g+  cocci pairs/chains, rare g+ rods U. Strep 01/04 >neg U. Legionella 01/04 >neg BAL 01/05> culture pending, gram stain moderate wbc (mostly pmn) no organisms  Cell diff>>>93% neutro, no eos Rvp>>>  ANTIBIOTICS: Vanc 01/04 >>> Zosyn 01/04 >>> Azithro 01/04 >>>1/5 Levofloxacin 1/5>>>  SIGNIFICANT EVENTS: 01/04 > admitted with acute hypoxic respiratory failure and concern for atypical PNA. 1/5- distress , on bipap --> intubated, bronchoscopy, a-line and central line placed  ASSESSMENT / PLAN:  PULMONARY A: Acute hypoxic respiratory failure with bilateral infiltrates on CTA - concerning for atypical PNA vs post viral pneumonitis vs unlikely volume vs ARDS Former smoker. P:   Empiric abx (vanc / zosyn / levaquin). CXR in AM If ARDS suspected, cont MV with low tidal volume and high rate Solumedrol 80 mg IV q6h (viral pneumonitis?) CTA neg for PE -> extensive ground glass diffuse bilaterally BAL -> 610 WBC, 93 Neutro, 1 lymph, 4 mono, 2 eos (less likely eosinophilic pna) ABG improved, NO SBT, goal peep to 5 prior Keep rate same Even to pos balance Will paralyze for 48 hr When neo off, then lasix  INFECTIOUS A:   Concern for atypical PNA vs post viral pneumonitis (pct low) R/o viral panel / flu P:   Abx as above (vanc / zosyn / levaquin), narrow in am  Follow cultures as above. PCT algorithm to limit abx exposure, rpt 0.11. resp viral panel pending bronch bal culture pending  CARDIOVASCULAR A:  Elevated BNP (1252) - no hx of heart failure, not volume overloaded on exam. Mild troponin leak. Hx HLD. Hypotension (sedation, pos pressure likely) Presumed rel AI ( on steroids already) pulm htn pa 52 (unDX copd?, vent, ards) P:  Off Lasix Continue outpatient crestor. A-line placed, follow cvp On Neo-gtt to maintain MAP and BP Steroids Map goal 60  RENAL A:   Hypokalemia -> improved.  P:   Replace electrolytes as needed. NS @ 75, likely to reduce to kvo once off  pressors, cvp 9 in setting some pA htn  BMP in AM UOP 1600 mLs, net up 2600 last 24 hours  GASTROINTESTINAL A:   Nutrition (ARDS) P:   TF as tolerated start today Consider oxepa ppi  HEMATOLOGIC A:   VTE Prophylaxis. Leukocytosis -> improving P:  SCD's / Heparin. CBC in AM. CTA neg for PE  ENDOCRINE A:   Rel AI likely  P:   Monitor glucose on BMP. Steroids  NEUROLOGIC A:   Hx anxiety / depression. Severe ARDS  P:   Continue outpatient fluoxetine. Hold outpatient norco. Paralyze till am  rass -5 deep, versd, fent  Darreld Mclean, MD Internal Medicine PGY1   STAFF NOTE: I, Rory Percy, MD FACP have personally reviewed patient's available data, including medical history, events of note, physical examination and test results as part of my evaluation. I have discussed with resident/NP and other care providers such as pharmacist, RN and RRT. In addition, I personally evaluated patient and elicited key findings of: ronchi on examination, pcx unchanged, no sig edema, rass deep and paralyzed, she has improved on vent o2 needs are better, will paralyze till am then dc, maintain deep sedation, peep to goal 5 and 40% likely , no eos on bronch, follow culture from bronch, unimpressed autoimmune, neg pe, rv pa pressures from ventm, ards, copd likely, likley will wean neo off, if able then kvo , then  lasix in am likely, CT reviewed, this is likely post viral ards or active pneumonitis, in am if neg culture would dc vanc, zosyn, maintain levofloxacin, steroids may have made rapid improvements, reduce in am likely, await RVP, will update daughter, plat are issue, reduce to 6 cc/kg, repeat abg The patient is critically ill with multiple organ systems failure and requires high complexity decision making for assessment and support, frequent evaluation and titration of therapies, application of advanced monitoring technologies and extensive interpretation of multiple databases.    Critical Care Time devoted to patient care services described in this note is35 Minutes. This time reflects time of care of this signee: Rory Percy, MD FACP. This critical care time does not reflect procedure time, or teaching time or supervisory time of PA/NP/Med student/Med Resident etc but could involve care discussion time. Rest per NP/medical resident whose note is outlined above and that I agree with   Mcarthur Rossetti. Tyson Alias, MD, FACP Pgr: [phone] [facility] 05/16/2015 8:19 AM

## 2015-05-16 NOTE — Progress Notes (Signed)
VASCULAR LAB PRELIMINARY  PRELIMINARY  PRELIMINARY  PRELIMINARY  Bilateral lower extremity venous duplex  completed.    Preliminary report:  Bilateral:  No evidence of DVT, superficial thrombosis, or Baker's Cyst.    Gini Caputo, RVT 05/16/2015, 11:04 AM

## 2015-05-16 NOTE — Progress Notes (Signed)
Pt placed on droplet precautions; pt sedated on ventilator; no family at bedside for education.  Burnard Bunting, RN

## 2015-05-17 ENCOUNTER — Inpatient Hospital Stay (HOSPITAL_COMMUNITY): Payer: BLUE CROSS/BLUE SHIELD

## 2015-05-17 DIAGNOSIS — J439 Emphysema, unspecified: Secondary | ICD-10-CM

## 2015-05-17 LAB — CBC
HEMATOCRIT: 30.9 % — AB (ref 36.0–46.0)
HEMOGLOBIN: 9.3 g/dL — AB (ref 12.0–15.0)
MCH: 29.4 pg (ref 26.0–34.0)
MCHC: 30.1 g/dL (ref 30.0–36.0)
MCV: 97.8 fL (ref 78.0–100.0)
Platelets: 302 10*3/uL (ref 150–400)
RBC: 3.16 MIL/uL — AB (ref 3.87–5.11)
RDW: 13.5 % (ref 11.5–15.5)
WBC: 9.6 10*3/uL (ref 4.0–10.5)

## 2015-05-17 LAB — COMPREHENSIVE METABOLIC PANEL
ALT: 18 U/L (ref 14–54)
AST: 14 U/L — AB (ref 15–41)
Albumin: 2 g/dL — ABNORMAL LOW (ref 3.5–5.0)
Alkaline Phosphatase: 96 U/L (ref 38–126)
Anion gap: 5 (ref 5–15)
BILIRUBIN TOTAL: 0.1 mg/dL — AB (ref 0.3–1.2)
BUN: 27 mg/dL — AB (ref 6–20)
CHLORIDE: 108 mmol/L (ref 101–111)
CO2: 32 mmol/L (ref 22–32)
CREATININE: 0.48 mg/dL (ref 0.44–1.00)
Calcium: 8.6 mg/dL — ABNORMAL LOW (ref 8.9–10.3)
GFR calc Af Amer: >60 mL/min (ref >60)
GLUCOSE: 205 mg/dL — AB (ref 65–99)
Potassium: 4.2 mmol/L (ref 3.5–5.1)
Sodium: 145 mmol/L (ref 135–145)
Total Protein: 5.5 g/dL — ABNORMAL LOW (ref 6.5–8.1)

## 2015-05-17 LAB — TSH: TSH: 0.315 u[IU]/mL — ABNORMAL LOW (ref 0.350–4.500)

## 2015-05-17 LAB — RESPIRATORY VIRUS PANEL
Adenovirus: NEGATIVE
INFLUENZA A: NEGATIVE
Influenza B: NEGATIVE
Metapneumovirus: NEGATIVE
PARAINFLUENZA 2 A: NEGATIVE
Parainfluenza 1: NEGATIVE
Parainfluenza 3: NEGATIVE
RESPIRATORY SYNCYTIAL VIRUS B: NEGATIVE
RHINOVIRUS: NEGATIVE
Respiratory Syncytial Virus A: NEGATIVE

## 2015-05-17 LAB — GLUCOSE, CAPILLARY
GLUCOSE-CAPILLARY: 156 mg/dL — AB (ref 65–99)
GLUCOSE-CAPILLARY: 156 mg/dL — AB (ref 65–99)
GLUCOSE-CAPILLARY: 168 mg/dL — AB (ref 65–99)
GLUCOSE-CAPILLARY: 169 mg/dL — AB (ref 65–99)

## 2015-05-17 MED ORDER — SODIUM BICARBONATE 8.4 % IV SOLN
INTRAVENOUS | Status: DC
  Administered 2015-05-17: 07:00:00 via INTRAVENOUS
  Filled 2015-05-17 (×2): qty 100

## 2015-05-17 MED ORDER — SODIUM CHLORIDE 0.9 % IV SOLN
INTRAVENOUS | Status: DC
  Administered 2015-05-17 – 2015-05-26 (×2): via INTRAVENOUS

## 2015-05-17 MED ORDER — METHYLPREDNISOLONE SODIUM SUCC 40 MG IJ SOLR
40.0000 mg | Freq: Two times a day (BID) | INTRAMUSCULAR | Status: DC
  Administered 2015-05-18 (×3): 40 mg via INTRAVENOUS
  Filled 2015-05-17 (×5): qty 1

## 2015-05-17 NOTE — Progress Notes (Signed)
PULMONARY / CRITICAL CARE MEDICINE   Name: Julia Massey MRN: [id number] DOB: 1950/07/31    ADMISSION DATE:  05/14/2015 CONSULTATION DATE:  05/13/14  REFERRING MD:  [facility]   CHIEF COMPLAINT:  SOB  HISTORY OF PRESENT ILLNESS:  Julia Massey is a 65 y.o. female with PMH of HLD, anxiety, depression, and COPD (never had formal PFT's, was only told she had COPD per CXR report).  Recent URI treated with z-pack. She apparently was initially feeling somewhat better, then began to have worsening SOB. Continued to have cough productive of thick green sputum and had some mild chest pain associated with coughing.    On 01/04, she went to [facility] where she was found to be hypoxic to the point that she required BiPAP (was 73% on RA).  Trial off BiPAP was attempted, but she unfortunately failed.  CTA was obtained which revealed extensive bilateral patchy infiltrates c/w atypical PNA.  Additional labs noteable for WBC 20.8 (85% neut's), K 3.2, BNP 1252, Trop 0.039, lactic acid 2.5.  Flu PCR was negative.   SUBJECTIVE: Stopped nmb, increase sedation to prevent air trapping 1/7  VITAL SIGNS: BP 114/57 mmHg  Pulse 84  Temp(Src) 98 F (36.7 C) (Core (Comment))  Resp 35  Ht 5' (1.524 m)  Wt 132 lb 11.5 oz (60.2 kg)  BMI 25.92 kg/m2  SpO2 99%  HEMODYNAMICS: CVP:  [9 mmHg-11 mmHg] 9 mmHg  VENTILATOR SETTINGS: Vent Mode:  [-] PRVC FiO2 (%):  [40 %] 40 % Set Rate:  [35 bmp] 35 bmp Vt Set:  [300 mL] 300 mL PEEP:  [5 cmH20] 5 cmH20 Plateau Pressure:  [20 cmH20-21 cmH20] 21 cmH20  INTAKE / OUTPUT: I/O last 3 completed shifts: In: 5520.4 [I.V.:3695.4; Other:10; NG/GT:1115; IV Piggyback:700] Out: 1970 [Urine:1720; Emesis/NG output:250]   PHYSICAL EXAMINATION: General: Adult female, sedated, on mechanical vent Neuro: sedated, withdraws to pain HEENT NCAT, ETT in place PULM crackles bilaterally, no wheezing CV: RRR, no mgr GI: BS in frequent,  soft MSK: normal bulk, tone Derm: no cyanosis, no rash or skin breakdown  LABS:  BMET  Recent Labs Lab 05/15/15 0228 05/16/15 0530 05/17/15 0518  NA 140 140 145  K 4.2 3.9 4.2  CL 103 108 108  CO2 27 24 32  BUN 14 15 27*  CREATININE 0.57 0.42* 0.48  GLUCOSE 137* 153* 205*    Electrolytes  Recent Labs Lab 05/15/15 0228 05/16/15 0530 05/17/15 0518  CALCIUM 8.9 8.2* 8.6*    CBC  Recent Labs Lab 05/15/15 0228 05/16/15 0530 05/17/15 0518  WBC 21.3* 10.9* 9.6  HGB 11.9* 9.1* 9.3*  HCT 36.6 29.2* 30.9*  PLT 429* 283 302    Coag's  Recent Labs Lab 05/15/15 1425  APTT 33  INR 1.31    Sepsis Markers  Recent Labs Lab 05/14/15 0334 05/16/15 0530  LATICACIDVEN 2.0  --   PROCALCITON 0.10 0.11    ABG  Recent Labs Lab 05/16/15 1452 05/16/15 1748 05/17/15 0405  PHART 7.174* 7.309* 7.313*  PCO2ART 70.6* 52.5* 63.4*  PO2ART 80.0 84.0 89.9    Liver Enzymes  Recent Labs Lab 05/14/15 0334 05/17/15 0518  AST 27 14*  ALT 16 18  ALKPHOS 119 96  BILITOT 0.6 0.1*  ALBUMIN 2.4* 2.0*    Cardiac Enzymes  Recent Labs Lab 05/14/15 0334  TROPONINI 0.15*    Glucose  Recent Labs Lab 05/16/15 1209 05/16/15 1544 05/16/15 2015 05/16/15 2345 05/17/15 0347 05/17/15 0830  GLUCAP 160* 116*  144* 156* 168* 169*    Imaging 1/7 CXR > improving bilateral infiltrates, ETT in place   STUDIES:  CTA 01/03 > no PE.  Extensive bilateral patchy pulmonary infiltrates c/w atypical PNA.  Additional peripheral pulmonary fibrosis at both lung bases.  Mediastinal and hilar adenopathy, likely reactive. CTA 01/05 >>  1. No evidence pulmonary embolism.  2. Cardiomegaly with right atrial enlargement, mild left ventricular  hypertrophy and right ventricular hypertrophy. Mild left main and  LAD coronary atherosclerosis.  3. Extensive ground-glass opacity throughout both lungs with  scattered confluent airspace opacities peripherally in both lungs.  ARDS  suspected. The peripheral airspace opacities raise the  possibility eosinophilic pneumonia.  4. Likely reactive mediastinal and right hilar lymphadenopathy. CT neck 01/05 > Significant periodontal disease. No features suggestive of mandibular osteomyelitis. Assessment of pharyngeal masses and mucosal surfaces is limited but no retropharyngeal or parapharyngeal abscess is observed. Cervical lymph node prominence is incompletely evaluated. No suppurative lymph node mass is evident  TTE 01/05:  - Left ventricle: The cavity size was normal. Wall thickness was normal. Systolic function was normal. The estimated ejection fraction was in the range of 55% to 60%. Wall motion was normal; there were no regional wall motion abnormalities. Doppler parameters are consistent with abnormal left ventricular relaxation (grade 1 diastolic dysfunction). - Ventricular septum: The contour showed diastolic flattening and systolic flattening. - Right ventricle: The cavity size was moderately dilated. Systolic function was severely reduced. - Right atrium: The atrium was mildly dilated. - Pulmonary arteries: Systolic pressure was moderately increased. PA peak pressure: 52 mm Hg (S). - Pericardium, extracardiac: A trivial pericardial effusion was identified.   CULTURES: Flu dec 28th>>>neg Blood 01/04 > Urine 01/04 > neg Sputum 01/04 > nl flora U. Strep 01/04 >neg U. Legionella 01/04 >neg BAL 01/05> culture pending NGTD, gram stain moderate wbc (mostly pmn) no organisms  Cell diff>>>93% neutro, no eos Rvp>>>neg  ANTIBIOTICS: Vanc 01/04 >>>1/7 Zosyn 01/04 >>>1/7 Azithro 01/04 >>>1/5 Levofloxacin 1/5>>>  SIGNIFICANT EVENTS: 01/04 > admitted with acute hypoxic respiratory failure and concern for atypical PNA. 1/5- distress , on bipap --> intubated, bronchoscopy, a-line and central line placed  ASSESSMENT / PLAN:  PULMONARY A: ARDS> better Former smoker P:   Stop NMB KVO  fluids Continue full vent support Continue bronchodilators Wean solumedrol Decrease RR 25 now ABG AM CXR AM  INFECTIOUS A:   Concern for atypical PNA vs post viral pneumonitis (pct low) R/o viral panel / flu P:   Continue levaquin for now Follow Respiratory viral panel  CARDIOVASCULAR A:  Elevated BNP (1252) - no hx of heart failure, not volume overloaded on exam Mild troponin leak Hx HLD pulm htn pa 52 > due to hypoxemia P:  Continue outpatient crestor Map goal 60  RENAL  Recent Labs Lab 05/15/15 0228 05/16/15 0530 05/17/15 0518  K 4.2 3.9 4.2     A:   Hypokalemia -> improved P:   Monitor BMET and UOP Replace electrolytes as needed   GASTROINTESTINAL A:   Nutrition P:   TF as tolerated  ppi  HEMATOLOGIC A:   VTE Prophylaxis. Leukocytosis ->resolved P:  SCD's / Heparin CBC  CTA neg for PE  ENDOCRINE A:   Rel AI likely  P:   Monitor glucose on BMP Steroids  NEUROLOGIC A:   Hx anxiety / depression Severe ARDS  P:   Continue outpatient fluoxetine Hold outpatient norco DC NMB 1/7 as trial and observe for breath stacking May need increased sedation  My cc time 35 minutes  Heber Nelson, MD [facility] Pager: [phone] Cell: [phone] After 3pm or if no response, call [phone]

## 2015-05-17 NOTE — Progress Notes (Signed)
Pharmacy Antibiotic Follow-up Note  Julia Massey is a 65 y.o. year-old female admitted on 05/14/2015.  The patient is currently on day 3 of abx for rule-out PNA.  Assessment/Plan: This patient's current antibiotics will be continued without adjustments.  Temp (24hrs), Avg:98 F (36.7 C), Min:97 F (36.1 C), Max:98.6 F (37 C)   Recent Labs Lab 05/14/15 0334 05/15/15 0228 05/16/15 0530 05/17/15 0518  WBC 18.6* 21.3* 10.9* 9.6    Recent Labs Lab 05/14/15 0334 05/15/15 0228 05/16/15 0530 05/17/15 0518  CREATININE 0.43* 0.57 0.42* 0.48   Estimated Creatinine Clearance: 59.2 mL/min (by C-G formula based on Cr of 0.48).    Not on File  Antimicrobials this admission: Vanc 1/4>> Zosyn 1/4>> Azithromycin 1/4>> 1/5 Levofloxacin 1/5>>  Levels/dose changes this admission: None  Microbiology results: 1/4 S.pneumo Ag: negative 1/4 UA: negative 1/4 MRSA PCR - neg 1/4 BCx - ngtd 1/4 Sputum Cx - neg 1/4 UCx - ngtd 1/5 RVP - neg 1/5 Bronch - WBC 610, Neutrophils 93, Eos no organisms seen  Thank you for allowing pharmacy to be a part of this patient's care.  Isaac Bliss, PharmD, BCPS, [facility] Clinical Pharmacist Pager [phone] 05/17/2015 11:09 AM

## 2015-05-18 ENCOUNTER — Inpatient Hospital Stay (HOSPITAL_COMMUNITY): Payer: BLUE CROSS/BLUE SHIELD

## 2015-05-18 LAB — BLOOD GAS, ARTERIAL
ACID-BASE EXCESS: 10.3 mmol/L — AB (ref 0.0–2.0)
BICARBONATE: 35.3 meq/L — AB (ref 20.0–24.0)
Drawn by: 44956
FIO2: 0.4
O2 SAT: 97.8 %
PATIENT TEMPERATURE: 98.6
PCO2 ART: 57.6 mmHg — AB (ref 35.0–45.0)
PEEP/CPAP: 5 cmH2O
PH ART: 7.405 (ref 7.350–7.450)
RATE: 25 resp/min
TCO2: 37.1 mmol/L (ref 0–100)
VT: 300 mL
pO2, Arterial: 105 mmHg — ABNORMAL HIGH (ref 80.0–100.0)

## 2015-05-18 LAB — CBC
HCT: 31 % — ABNORMAL LOW (ref 36.0–46.0)
HEMOGLOBIN: 9.1 g/dL — AB (ref 12.0–15.0)
MCH: 29.3 pg (ref 26.0–34.0)
MCHC: 29.4 g/dL — ABNORMAL LOW (ref 30.0–36.0)
MCV: 99.7 fL (ref 78.0–100.0)
PLATELETS: 297 10*3/uL (ref 150–400)
RBC: 3.11 MIL/uL — AB (ref 3.87–5.11)
RDW: 13.7 % (ref 11.5–15.5)
WBC: 12 10*3/uL — AB (ref 4.0–10.5)

## 2015-05-18 LAB — CULTURE, BAL-QUANTITATIVE W GRAM STAIN
Colony Count: NO GROWTH
Culture: NO GROWTH

## 2015-05-18 LAB — BASIC METABOLIC PANEL
ANION GAP: 6 (ref 5–15)
BUN: 30 mg/dL — ABNORMAL HIGH (ref 6–20)
CHLORIDE: 103 mmol/L (ref 101–111)
CO2: 36 mmol/L — ABNORMAL HIGH (ref 22–32)
Calcium: 8.4 mg/dL — ABNORMAL LOW (ref 8.9–10.3)
Creatinine, Ser: 0.42 mg/dL — ABNORMAL LOW (ref 0.44–1.00)
GFR calc Af Amer: >60 mL/min (ref >60)
Glucose, Bld: 150 mg/dL — ABNORMAL HIGH (ref 65–99)
POTASSIUM: 3.9 mmol/L (ref 3.5–5.1)
SODIUM: 145 mmol/L (ref 135–145)

## 2015-05-18 LAB — GLUCOSE, CAPILLARY
GLUCOSE-CAPILLARY: 157 mg/dL — AB (ref 65–99)
GLUCOSE-CAPILLARY: 159 mg/dL — AB (ref 65–99)
Glucose-Capillary: 138 mg/dL — ABNORMAL HIGH (ref 65–99)
Glucose-Capillary: 139 mg/dL — ABNORMAL HIGH (ref 65–99)
Glucose-Capillary: 152 mg/dL — ABNORMAL HIGH (ref 65–99)

## 2015-05-18 LAB — PHOSPHORUS: PHOSPHORUS: 2.1 mg/dL — AB (ref 2.5–4.6)

## 2015-05-18 LAB — CULTURE, BAL-QUANTITATIVE

## 2015-05-18 LAB — PROCALCITONIN: Procalcitonin: <0.1 ng/mL

## 2015-05-18 LAB — MAGNESIUM: Magnesium: 2.1 mg/dL (ref 1.7–2.4)

## 2015-05-18 MED ORDER — DEXTROSE 5 % IV SOLN
40.0000 meq | Freq: Once | INTRAVENOUS | Status: AC
  Administered 2015-05-18: 40 meq via INTRAVENOUS
  Filled 2015-05-18 (×2): qty 9.09

## 2015-05-18 MED ORDER — PROPOFOL 1000 MG/100ML IV EMUL
5.0000 ug/kg/min | INTRAVENOUS | Status: DC
  Administered 2015-05-18: 5 ug/kg/min via INTRAVENOUS
  Administered 2015-05-19: 20 ug/kg/min via INTRAVENOUS
  Filled 2015-05-18 (×2): qty 100

## 2015-05-18 MED ORDER — MIDAZOLAM HCL 2 MG/2ML IJ SOLN
INTRAMUSCULAR | Status: AC
  Administered 2015-05-18: 2 mg
  Filled 2015-05-18: qty 2

## 2015-05-18 MED ORDER — MIDAZOLAM HCL 2 MG/2ML IJ SOLN
2.0000 mg | INTRAMUSCULAR | Status: DC | PRN
Start: 2015-05-18 — End: 2015-05-21
  Administered 2015-05-18 – 2015-05-21 (×19): 2 mg via INTRAVENOUS
  Filled 2015-05-18 (×19): qty 2

## 2015-05-18 NOTE — Progress Notes (Signed)
Wasted 45 ml of versed with second RN, Publishing copy.

## 2015-05-18 NOTE — Progress Notes (Signed)
eLink Physician-Brief Progress Note Patient Name: Julia Massey DOB: 01-15-1951 MRN: [id number]   Date of Service  05/18/2015  HPI/Events of Note    eICU Interventions  Severe agitn, biting ETT on camera   eICU Interventions  On fent gtt Needing freq versed Add propofol gtt            Intervention Category Major Interventions: Delirium, psychosis, severe agitation - evaluation and management  ALVA,RAKESH V. 05/18/2015, 3:59 PM

## 2015-05-18 NOTE — Progress Notes (Signed)
PULMONARY / CRITICAL CARE MEDICINE   Name: Julia Massey MRN: [id number] DOB: [birth year]    ADMISSION DATE:  05/14/2015 CONSULTATION DATE:  05/13/14  REFERRING MD:  [facility]   CHIEF COMPLAINT:  SOB  HISTORY OF PRESENT ILLNESS:  Julia Massey is a 65 y.o. female with PMH of HLD, anxiety, depression, and COPD (never had formal PFT's, was only told she had COPD per CXR report).  Recent URI treated with z-pack. She apparently was initially feeling somewhat better, then began to have worsening SOB. Continued to have cough productive of thick green sputum and had some mild chest pain associated with coughing.    On 01/04, she went to [facility] where she was found to be hypoxic to the point that she required BiPAP (was 73% on RA).  Trial off BiPAP was attempted, but she unfortunately failed.  CTA was obtained which revealed extensive bilateral patchy infiltrates c/w atypical PNA.  Additional labs noteable for WBC 20.8 (85% neut's), K 3.2, BNP 1252, Trop 0.039, lactic acid 2.5.  Flu PCR was negative.   SUBJECTIVE: sedated on mechanical vent  VITAL SIGNS: BP 109/63 mmHg  Pulse 91  Temp(Src) 99.9 F (37.7 C) (Oral)  Resp 23  Ht 5' (1.524 m)  Wt 133 lb 6.1 oz (60.5 kg)  BMI 26.05 kg/m2  SpO2 96%  HEMODYNAMICS: CVP:  [9 mmHg-12 mmHg] 12 mmHg  VENTILATOR SETTINGS: Vent Mode:  [-] PRVC FiO2 (%):  [40 %] 40 % Set Rate:  [25 bmp-35 bmp] 25 bmp Vt Set:  [300 mL] 300 mL PEEP:  [5 cmH20] 5 cmH20 Plateau Pressure:  [14 cmH20-21 cmH20] 19 cmH20  INTAKE / OUTPUT: I/O last 3 completed shifts: In: 4059.9 [I.V.:2109.9; NG/GT:1750; IV Piggyback:200] Out: 2295 [Urine:2295]   PHYSICAL EXAMINATION: General: Adult female, sedated, on mechanical vent Neuro: sedated HEENT: PERRL, NCAT, ETT in place PULM: clear bilaterally, vent assisted breathing, no wheezing CV: RRR, no mgr GI: soft, +BS MSK: no edema Derm: no cyanosis, no rash or skin  breakdown  LABS:  BMET  Recent Labs Lab 05/16/15 0530 05/17/15 0518 05/18/15 0445  NA 140 145 145  K 3.9 4.2 3.9  CL 108 108 103  CO2 24 32 36*  BUN 15 27* 30*  CREATININE 0.42* 0.48 0.42*  GLUCOSE 153* 205* 150*    Electrolytes  Recent Labs Lab 05/16/15 0530 05/17/15 0518 05/18/15 0445  CALCIUM 8.2* 8.6* 8.4*  MG  --   --  2.1  PHOS  --   --  2.1*    CBC  Recent Labs Lab 05/16/15 0530 05/17/15 0518 05/18/15 0445  WBC 10.9* 9.6 12.0*  HGB 9.1* 9.3* 9.1*  HCT 29.2* 30.9* 31.0*  PLT 283 302 297    Coag's  Recent Labs Lab 05/15/15 1425  APTT 33  INR 1.31    Sepsis Markers  Recent Labs Lab 05/14/15 0334 05/16/15 0530 05/18/15 0445  LATICACIDVEN 2.0  --   --   PROCALCITON 0.10 0.11 <0.10    ABG  Recent Labs Lab 05/16/15 1748 05/17/15 0405 05/18/15 0405  PHART 7.309* 7.313* 7.405  PCO2ART 52.5* 63.4* 57.6*  PO2ART 84.0 89.9 105*    Liver Enzymes  Recent Labs Lab 05/14/15 0334 05/17/15 0518  AST 27 14*  ALT 16 18  ALKPHOS 119 96  BILITOT 0.6 0.1*  ALBUMIN 2.4* 2.0*    Cardiac Enzymes  Recent Labs Lab 05/14/15 0334  TROPONINI 0.15*    Glucose  Recent Labs Lab 05/16/15 2345  05/17/15 0347 05/17/15 0830 05/17/15 2050 05/18/15 0018 05/18/15 0422  GLUCAP 156* 168* 169* 156* 152* 138*    Imaging 1/7 CXR > improving bilateral infiltrates, ETT in place   STUDIES:  CTA 01/03 > no PE.  Extensive bilateral patchy pulmonary infiltrates c/w atypical PNA.  Additional peripheral pulmonary fibrosis at both lung bases.  Mediastinal and hilar adenopathy, likely reactive. CTA 01/05 >>  1. No evidence pulmonary embolism.  2. Cardiomegaly with right atrial enlargement, mild left ventricular  hypertrophy and right ventricular hypertrophy. Mild left main and  LAD coronary atherosclerosis.  3. Extensive ground-glass opacity throughout both lungs with  scattered confluent airspace opacities peripherally in both lungs.  ARDS  suspected. The peripheral airspace opacities raise the  possibility eosinophilic pneumonia.  4. Likely reactive mediastinal and right hilar lymphadenopathy. CT neck 01/05 > Significant periodontal disease. No features suggestive of mandibular osteomyelitis. Assessment of pharyngeal masses and mucosal surfaces is limited but no retropharyngeal or parapharyngeal abscess is observed. Cervical lymph node prominence is incompletely evaluated. No suppurative lymph node mass is evident  TTE 01/05:  - Left ventricle: The cavity size was normal. Wall thickness was normal. Systolic function was normal. The estimated ejection fraction was in the range of 55% to 60%. Wall motion was normal; there were no regional wall motion abnormalities. Doppler parameters are consistent with abnormal left ventricular relaxation (grade 1 diastolic dysfunction). - Ventricular septum: The contour showed diastolic flattening and systolic flattening. - Right ventricle: The cavity size was moderately dilated. Systolic function was severely reduced. - Right atrium: The atrium was mildly dilated. - Pulmonary arteries: Systolic pressure was moderately increased. PA peak pressure: 52 mm Hg (S). - Pericardium, extracardiac: A trivial pericardial effusion was identified.   CULTURES: Flu dec 28th>>>neg Blood 01/04 > Urine 01/04 > neg Sputum 01/04 > nl flora U. Strep 01/04 >neg U. Legionella 01/04 >neg BAL 01/05> culture pending NGTD, gram stain moderate wbc (mostly pmn) no organisms  Cell diff>>>93% neutro, no eos Rvp>>>neg  ANTIBIOTICS: Vanc 01/04 >>>1/7 Zosyn 01/04 >>>1/7 Azithro 01/04 >>>1/5 Levofloxacin 1/5>>>  SIGNIFICANT EVENTS: 01/04 > admitted with acute hypoxic respiratory failure and concern for atypical PNA. 1/5- distress , on bipap --> intubated, bronchoscopy, a-line and central line placed  ASSESSMENT / PLAN:  PULMONARY A: ARDS> better Former smoker P:   Off NMB KVO  fluids Continue full vent support Continue bronchodilators Wean solumedrol RR 25 now ABG AM CXR AM  INFECTIOUS A:   Concern for atypical PNA vs post viral pneumonitis (pct low) R/o viral panel / flu P:   Continue levaquin for now Respiratory viral panel negative  CARDIOVASCULAR A:  Elevated BNP (1252) - no hx of heart failure, not volume overloaded on exam Mild troponin leak Hx HLD pulm htn pa 52 > due to hypoxemia P:  Continue outpatient crestor Map goal 60  RENAL  Recent Labs Lab 05/16/15 0530 05/17/15 0518 05/18/15 0445  K 3.9 4.2 3.9     A:   Hypokalemia -> improved Hypophosphatemia P:   Monitor BMET and UOP Replace electrolytes as needed, Ph supplement   GASTROINTESTINAL A:   Nutrition P:   TF as tolerated  ppi  HEMATOLOGIC A:   VTE Prophylaxis. Leukocytosis ->resolved P:  SCD's / Heparin CBC  CTA neg for PE  ENDOCRINE A:   Rel AI likely  P:   Monitor glucose on BMP Steroids, weaning  NEUROLOGIC A:   Hx anxiety / depression Severe ARDS  P:   Continue outpatient fluoxetine  Hold outpatient norco Off NMB 1/7 as trial and observe for breath stacking May need increased sedation    Darreld Mclean Internal Medicine PGY1 Pager: [phone]

## 2015-05-19 ENCOUNTER — Inpatient Hospital Stay (HOSPITAL_COMMUNITY): Payer: BLUE CROSS/BLUE SHIELD

## 2015-05-19 DIAGNOSIS — J441 Chronic obstructive pulmonary disease with (acute) exacerbation: Secondary | ICD-10-CM | POA: Diagnosis present

## 2015-05-19 DIAGNOSIS — G9340 Encephalopathy, unspecified: Secondary | ICD-10-CM

## 2015-05-19 LAB — MAGNESIUM: Magnesium: 2.1 mg/dL (ref 1.7–2.4)

## 2015-05-19 LAB — BASIC METABOLIC PANEL
ANION GAP: 8 (ref 5–15)
BUN: 24 mg/dL — ABNORMAL HIGH (ref 6–20)
CHLORIDE: 99 mmol/L — AB (ref 101–111)
CO2: 39 mmol/L — ABNORMAL HIGH (ref 22–32)
Calcium: 8.5 mg/dL — ABNORMAL LOW (ref 8.9–10.3)
Creatinine, Ser: 0.45 mg/dL (ref 0.44–1.00)
GFR calc non Af Amer: >60 mL/min (ref >60)
Glucose, Bld: 163 mg/dL — ABNORMAL HIGH (ref 65–99)
POTASSIUM: 4.6 mmol/L (ref 3.5–5.1)
SODIUM: 146 mmol/L — AB (ref 135–145)

## 2015-05-19 LAB — POCT I-STAT 3, ART BLOOD GAS (G3+)
ACID-BASE EXCESS: 16 mmol/L — AB (ref 0.0–2.0)
BICARBONATE: 41.8 meq/L — AB (ref 20.0–24.0)
O2 SAT: 99 %
TCO2: 44 mmol/L (ref 0–100)
pCO2 arterial: 61.3 mmHg (ref 35.0–45.0)
pH, Arterial: 7.442 (ref 7.350–7.450)
pO2, Arterial: 136 mmHg — ABNORMAL HIGH (ref 80.0–100.0)

## 2015-05-19 LAB — BLOOD GAS, ARTERIAL
ACID-BASE DEFICIT: 11.7 mmol/L — AB (ref 0.0–2.0)
ACID-BASE EXCESS: 5.3 mmol/L — AB (ref 0.0–2.0)
BICARBONATE: 31.2 meq/L — AB (ref 20.0–24.0)
BICARBONATE: 15.1 meq/L — AB (ref 20.0–24.0)
DRAWN BY: 404151
Drawn by: 44956
FIO2: 0.4
FIO2: 0.5
LHR: 35 {breaths}/min
MECHVT: 300 mL
O2 SAT: 96 %
O2 SAT: 88.2 %
PEEP/CPAP: 5 cmH2O
PEEP/CPAP: 5 cmH2O
PH ART: 7.177 — AB (ref 7.350–7.450)
Patient temperature: 98.6
Patient temperature: 98.6
RATE: 25 resp/min
TCO2: 33.2 mmol/L (ref 0–100)
TCO2: 16.4 mmol/L (ref 0–100)
VT: 300 mL
pCO2 arterial: 63.4 mmHg (ref 35.0–45.0)
pCO2 arterial: 42.5 mmHg (ref 35.0–45.0)
pH, Arterial: 7.313 — ABNORMAL LOW (ref 7.350–7.450)
pO2, Arterial: 89.9 mmHg (ref 80.0–100.0)
pO2, Arterial: 68.9 mmHg — ABNORMAL LOW (ref 80.0–100.0)

## 2015-05-19 LAB — GLUCOSE, CAPILLARY
GLUCOSE-CAPILLARY: 115 mg/dL — AB (ref 65–99)
Glucose-Capillary: 129 mg/dL — ABNORMAL HIGH (ref 65–99)
Glucose-Capillary: 144 mg/dL — ABNORMAL HIGH (ref 65–99)
Glucose-Capillary: 128 mg/dL — ABNORMAL HIGH (ref 65–99)
Glucose-Capillary: 110 mg/dL — ABNORMAL HIGH (ref 65–99)
Glucose-Capillary: 153 mg/dL — ABNORMAL HIGH (ref 65–99)

## 2015-05-19 LAB — CULTURE, BLOOD (ROUTINE X 2)
CULTURE: NO GROWTH
CULTURE: NO GROWTH

## 2015-05-19 LAB — PHOSPHORUS: PHOSPHORUS: 3 mg/dL (ref 2.5–4.6)

## 2015-05-19 MED ORDER — DEXMEDETOMIDINE HCL IN NACL 400 MCG/100ML IV SOLN
0.4000 ug/kg/h | INTRAVENOUS | Status: DC
  Administered 2015-05-19: 1.2 ug/kg/h via INTRAVENOUS
  Administered 2015-05-20: 2 ug/kg/h via INTRAVENOUS
  Administered 2015-05-20: 1.2 ug/kg/h via INTRAVENOUS
  Administered 2015-05-20 (×3): 2 ug/kg/h via INTRAVENOUS
  Administered 2015-05-21 (×2): 2.5 ug/kg/h via INTRAVENOUS
  Administered 2015-05-21: 2 ug/kg/h via INTRAVENOUS
  Administered 2015-05-21: 2.5 ug/kg/h via INTRAVENOUS
  Administered 2015-05-21 (×2): 2 ug/kg/h via INTRAVENOUS
  Administered 2015-05-21 – 2015-05-22 (×4): 2.5 ug/kg/h via INTRAVENOUS
  Administered 2015-05-22: 2 ug/kg/h via INTRAVENOUS
  Filled 2015-05-19 (×18): qty 100

## 2015-05-19 MED ORDER — PANTOPRAZOLE SODIUM 40 MG PO PACK
40.0000 mg | PACK | Freq: Every day | ORAL | Status: DC
  Administered 2015-05-19 – 2015-05-25 (×7): 40 mg
  Filled 2015-05-19 (×8): qty 20

## 2015-05-19 MED ORDER — METHYLPREDNISOLONE SODIUM SUCC 40 MG IJ SOLR
40.0000 mg | INTRAMUSCULAR | Status: DC
  Filled 2015-05-19: qty 1

## 2015-05-19 MED ORDER — DEXMEDETOMIDINE HCL IN NACL 200 MCG/50ML IV SOLN
0.4000 ug/kg/h | INTRAVENOUS | Status: DC
  Administered 2015-05-19 (×4): 1.2 ug/kg/h via INTRAVENOUS
  Administered 2015-05-19: 0.4 ug/kg/h via INTRAVENOUS
  Filled 2015-05-19 (×2): qty 50

## 2015-05-19 MED ORDER — METHYLPREDNISOLONE SODIUM SUCC 40 MG IJ SOLR
40.0000 mg | Freq: Two times a day (BID) | INTRAMUSCULAR | Status: DC
  Administered 2015-05-19 (×2): 40 mg via INTRAVENOUS
  Filled 2015-05-19 (×4): qty 1

## 2015-05-19 NOTE — Progress Notes (Addendum)
Patient sats 79% upon arrival, patient thrashing in bed and bucking the vent. FiO2 cut up to 80% after given O2 breaths. RN notified of sedation needs.

## 2015-05-19 NOTE — Progress Notes (Signed)
PULMONARY / CRITICAL CARE MEDICINE   Name: Julia Massey MRN: [id number] DOB: [birth year]    ADMISSION DATE:  05/14/2015 CONSULTATION DATE:  05/13/14  REFERRING MD:  [facility]   CHIEF COMPLAINT:  SOB  HISTORY OF PRESENT ILLNESS:  Julia Massey is a 65 y.o. female with PMH of HLD, anxiety, depression, and COPD (never had formal PFT's, was only told she had COPD per CXR report).  Recent URI treated with z-pack. She apparently was initially feeling somewhat better, then began to have worsening SOB. Continued to have cough productive of thick green sputum and had some mild chest pain associated with coughing.    On 01/04, she went to [facility] where she was found to be hypoxic to the point that she required BiPAP (was 73% on RA).  Trial off BiPAP was attempted, but she unfortunately failed.  CTA was obtained which revealed extensive bilateral patchy infiltrates c/w atypical PNA.  Additional labs noteable for WBC 20.8 (85% neut's), K 3.2, BNP 1252, Trop 0.039, lactic acid 2.5.  Flu PCR was negative.   SUBJECTIVE: agitated last night, propofol was added  VITAL SIGNS: BP 141/103 mmHg  Pulse 104  Temp(Src) 98.8 F (37.1 C) (Core (Comment))  Resp 25  Ht 5' (1.524 m)  Wt 133 lb 13.1 oz (60.7 kg)  BMI 26.13 kg/m2  SpO2 97%  HEMODYNAMICS:    VENTILATOR SETTINGS: Vent Mode:  [-] PRVC FiO2 (%):  [40 %-50 %] 50 % Set Rate:  [25 bmp] 25 bmp Vt Set:  [300 mL] 300 mL PEEP:  [5 cmH20] 5 cmH20 Pressure Support:  [8 cmH20] 8 cmH20 Plateau Pressure:  [15 cmH20-18 cmH20] 15 cmH20  INTAKE / OUTPUT: I/O last 3 completed shifts: In: 2957.9 [I.V.:1352.9; NG/GT:1435; IV Piggyback:170] Out: 2755 [Urine:2755]   PHYSICAL EXAMINATION: General: Adult female, agitated Neuro: opens eyes and moves all extremities, agitated HEENT: PERRL, NCAT, ETT in place PULM: clear bilaterally, vent assisted breathing, no wheezing CV: tachycardic regular, no mgr GI: soft,  hypoactive bowel sounds MSK: no edema Derm: no cyanosis, no rash or skin breakdown  LABS:  BMET  Recent Labs Lab 05/17/15 0518 05/18/15 0445 05/19/15 0424  NA 145 145 146*  K 4.2 3.9 4.6  CL 108 103 99*  CO2 32 36* 39*  BUN 27* 30* 24*  CREATININE 0.48 0.42* 0.45  GLUCOSE 205* 150* 163*    Electrolytes  Recent Labs Lab 05/17/15 0518 05/18/15 0445 05/19/15 0424  CALCIUM 8.6* 8.4* 8.5*  MG  --  2.1 2.1  PHOS  --  2.1* 3.0    CBC  Recent Labs Lab 05/16/15 0530 05/17/15 0518 05/18/15 0445  WBC 10.9* 9.6 12.0*  HGB 9.1* 9.3* 9.1*  HCT 29.2* 30.9* 31.0*  PLT 283 302 297    Coag's  Recent Labs Lab 05/15/15 1425  APTT 33  INR 1.31    Sepsis Markers  Recent Labs Lab 05/14/15 0334 05/16/15 0530 05/18/15 0445  LATICACIDVEN 2.0  --   --   PROCALCITON 0.10 0.11 <0.10    ABG  Recent Labs Lab 05/18/15 0405 05/19/15 0416 05/19/15 0523  PHART 7.405 7.177* 7.442  PCO2ART 57.6* 42.5 61.3*  PO2ART 105* 68.9* 136.0*    Liver Enzymes  Recent Labs Lab 05/14/15 0334 05/17/15 0518  AST 27 14*  ALT 16 18  ALKPHOS 119 96  BILITOT 0.6 0.1*  ALBUMIN 2.4* 2.0*    Cardiac Enzymes  Recent Labs Lab 05/14/15 0334  TROPONINI 0.15*  Glucose  Recent Labs Lab 05/18/15 0422 05/18/15 0854 05/18/15 1229 05/18/15 2105 05/18/15 2326 05/19/15 0611  GLUCAP 138* 157* 159* 139* 115* 129*    Imaging 1/7 CXR > improving bilateral infiltrates, ETT in place   STUDIES:  CTA 01/03 > no PE.  Extensive bilateral patchy pulmonary infiltrates c/w atypical PNA.  Additional peripheral pulmonary fibrosis at both lung bases.  Mediastinal and hilar adenopathy, likely reactive. CTA 01/05 >>  1. No evidence pulmonary embolism.  2. Cardiomegaly with right atrial enlargement, mild left ventricular  hypertrophy and right ventricular hypertrophy. Mild left main and  LAD coronary atherosclerosis.  3. Extensive ground-glass opacity throughout both lungs  with  scattered confluent airspace opacities peripherally in both lungs.  ARDS suspected. The peripheral airspace opacities raise the  possibility eosinophilic pneumonia.  4. Likely reactive mediastinal and right hilar lymphadenopathy. CT neck 01/05 > Significant periodontal disease. No features suggestive of mandibular osteomyelitis. Assessment of pharyngeal masses and mucosal surfaces is limited but no retropharyngeal or parapharyngeal abscess is observed. Cervical lymph node prominence is incompletely evaluated. No suppurative lymph node mass is evident  TTE 01/05:  - Left ventricle: The cavity size was normal. Wall thickness was normal. Systolic function was normal. The estimated ejection fraction was in the range of 55% to 60%. Wall motion was normal; there were no regional wall motion abnormalities. Doppler parameters are consistent with abnormal left ventricular relaxation (grade 1 diastolic dysfunction). - Ventricular septum: The contour showed diastolic flattening and systolic flattening. - Right ventricle: The cavity size was moderately dilated. Systolic function was severely reduced. - Right atrium: The atrium was mildly dilated. - Pulmonary arteries: Systolic pressure was moderately increased. PA peak pressure: 52 mm Hg (S). - Pericardium, extracardiac: A trivial pericardial effusion was identified.   CULTURES: Flu dec 28th>>>neg Blood 01/04 > Urine 01/04 > neg Sputum 01/04 > nl flora U. Strep 01/04 >neg U. Legionella 01/04 >neg BAL 01/05> culture pending NGTD, gram stain moderate wbc (mostly pmn) no organisms  Cell diff>>>93% neutro, no eos Rvp>>>neg  ANTIBIOTICS: Vanc 01/04 >>>1/7 Zosyn 01/04 >>>1/7 Azithro 01/04 >>>1/5 Levofloxacin 1/5>>>  SIGNIFICANT EVENTS: 01/04 > admitted with acute hypoxic respiratory failure and concern for atypical PNA. 1/5- distress , on bipap --> intubated, bronchoscopy, a-line and central line  placed  ASSESSMENT / PLAN:  PULMONARY A: ARDS> better Former smoker P:   KVO fluids Continue full vent support Continue bronchodilators Wean solumedrol RR 25 now ABG AM CXR AM  INFECTIOUS A:   Concern for atypical PNA vs post viral pneumonitis (pct low) R/o viral panel / flu P:   Continue levaquin for now Respiratory viral panel negative  CARDIOVASCULAR A:  Elevated BNP (1252) - no hx of heart failure, not volume overloaded on exam Mild troponin leak Hx HLD pulm htn pa 52 > due to hypoxemia P:  Continue outpatient crestor Map goal 60  RENAL  Recent Labs Lab 05/17/15 0518 05/18/15 0445 05/19/15 0424  K 4.2 3.9 4.6     A:   Hypokalemia -> improved Hypophosphatemia -> improved P:   Monitor BMET and UOP Replace electrolytes as needed   GASTROINTESTINAL A:   Nutrition P:   TF as tolerated  ppi  HEMATOLOGIC A:   VTE Prophylaxis. Leukocytosis ->resolved P:  SCD's / Heparin CBC  CTA neg for PE  ENDOCRINE A:   Rel AI likely  P:   Monitor glucose on BMP Steroids, weaning  NEUROLOGIC A:   Hx anxiety / depression Severe ARDS  P:  Continue outpatient fluoxetine outpatient norco scheduled prn, was held last several days. Versed and Fentanyl reduced yesterday Propofol added yesterday for agitation     Darreld Mclean Internal Medicine PGY1 Pager: [phone]

## 2015-05-19 NOTE — Progress Notes (Signed)
Patient sats 79%, O2 breaths given and FiO2 cut up to 60%. Patient thrashing in bed and bucking the vent. Patient suctioned obtaining thick tan secretions. Bag and lavaged patient as well. RN notified of needs of more sedation.

## 2015-05-20 ENCOUNTER — Inpatient Hospital Stay (HOSPITAL_COMMUNITY): Payer: BLUE CROSS/BLUE SHIELD

## 2015-05-20 LAB — BLOOD GAS, ARTERIAL
ACID-BASE EXCESS: 12.7 mmol/L — AB (ref 0.0–2.0)
BICARBONATE: 37.2 meq/L — AB (ref 20.0–24.0)
Drawn by: 252031
FIO2: 0.5
LHR: 14 {breaths}/min
O2 SAT: 99.5 %
PATIENT TEMPERATURE: 98.6
PCO2 ART: 51.9 mmHg — AB (ref 35.0–45.0)
PEEP/CPAP: 8 cmH2O
PH ART: 7.469 — AB (ref 7.350–7.450)
PRESSURE CONTROL: 14 cmH2O
TCO2: 38.8 mmol/L (ref 0–100)
pO2, Arterial: 189 mmHg — ABNORMAL HIGH (ref 80.0–100.0)

## 2015-05-20 LAB — GLUCOSE, CAPILLARY
GLUCOSE-CAPILLARY: 131 mg/dL — AB (ref 65–99)
GLUCOSE-CAPILLARY: 138 mg/dL — AB (ref 65–99)
GLUCOSE-CAPILLARY: 150 mg/dL — AB (ref 65–99)
GLUCOSE-CAPILLARY: 161 mg/dL — AB (ref 65–99)
Glucose-Capillary: 143 mg/dL — ABNORMAL HIGH (ref 65–99)
Glucose-Capillary: 127 mg/dL — ABNORMAL HIGH (ref 65–99)

## 2015-05-20 LAB — BASIC METABOLIC PANEL
ANION GAP: 8 (ref 5–15)
BUN: 19 mg/dL (ref 6–20)
CHLORIDE: 96 mmol/L — AB (ref 101–111)
CO2: 38 mmol/L — AB (ref 22–32)
Calcium: 8.3 mg/dL — ABNORMAL LOW (ref 8.9–10.3)
Creatinine, Ser: 0.36 mg/dL — ABNORMAL LOW (ref 0.44–1.00)
GFR calc non Af Amer: >60 mL/min (ref >60)
Glucose, Bld: 173 mg/dL — ABNORMAL HIGH (ref 65–99)
POTASSIUM: 4.4 mmol/L (ref 3.5–5.1)
SODIUM: 142 mmol/L (ref 135–145)

## 2015-05-20 LAB — CBC
HCT: 31.5 % — ABNORMAL LOW (ref 36.0–46.0)
HEMOGLOBIN: 9.7 g/dL — AB (ref 12.0–15.0)
MCH: 30.4 pg (ref 26.0–34.0)
MCHC: 30.8 g/dL (ref 30.0–36.0)
MCV: 98.7 fL (ref 78.0–100.0)
Platelets: 252 10*3/uL (ref 150–400)
RBC: 3.19 MIL/uL — ABNORMAL LOW (ref 3.87–5.11)
RDW: 13.5 % (ref 11.5–15.5)
WBC: 9.9 10*3/uL (ref 4.0–10.5)

## 2015-05-20 LAB — ANTIPHOSPHOLIPID SYNDROME EVAL, BLD
Anticardiolipin IgM: <9 MPL U/mL (ref 0–12)
DRVVT: 55.8 s — AB (ref 0.0–44.0)
PTT LA: 35.9 s (ref 0.0–40.6)
Phosphatydalserine, IgA: 2 APS IgA (ref 0–20)
Phosphatydalserine, IgG: 2 GPS IgG (ref 0–11)
Phosphatydalserine, IgM: 9 MPS IgM (ref 0–25)

## 2015-05-20 LAB — DRVVT CONFIRM: dRVVT Confirm: 1.1 ratio (ref 0.8–1.2)

## 2015-05-20 LAB — DRVVT MIX: dRVVT Mix: 45.3 s — ABNORMAL HIGH (ref 0.0–44.0)

## 2015-05-20 MED ORDER — FOLIC ACID 1 MG PO TABS
1.0000 mg | ORAL_TABLET | Freq: Every day | ORAL | Status: DC
  Administered 2015-05-20: 1 mg
  Filled 2015-05-20: qty 1

## 2015-05-20 MED ORDER — ADULT MULTIVITAMIN W/MINERALS CH
1.0000 | ORAL_TABLET | Freq: Every day | ORAL | Status: DC
  Administered 2015-05-20 – 2015-05-30 (×10): 1 via ORAL
  Filled 2015-05-20 (×11): qty 1

## 2015-05-20 MED ORDER — THIAMINE HCL 100 MG/ML IJ SOLN
100.0000 mg | Freq: Every day | INTRAMUSCULAR | Status: DC
  Filled 2015-05-20: qty 1

## 2015-05-20 MED ORDER — LORAZEPAM 1 MG PO TABS: 1.0000 mg | ORAL_TABLET | Freq: Four times a day (QID) | ORAL | Status: DC | PRN

## 2015-05-20 MED ORDER — VITAMIN B-1 100 MG PO TABS
100.0000 mg | ORAL_TABLET | Freq: Every day | ORAL | Status: DC
  Administered 2015-05-20: 100 mg via NASOGASTRIC
  Filled 2015-05-20: qty 1

## 2015-05-20 MED ORDER — METHYLPREDNISOLONE SODIUM SUCC 40 MG IJ SOLR
40.0000 mg | INTRAMUSCULAR | Status: DC
  Administered 2015-05-20 – 2015-05-25 (×5): 40 mg via INTRAVENOUS
  Filled 2015-05-20 (×6): qty 1

## 2015-05-20 MED ORDER — LORAZEPAM 2 MG/ML IJ SOLN
1.0000 mg | Freq: Four times a day (QID) | INTRAMUSCULAR | Status: DC | PRN
  Administered 2015-05-20: 1 mg via INTRAVENOUS
  Filled 2015-05-20: qty 1

## 2015-05-20 NOTE — Progress Notes (Signed)
RT note- patient is very agitated and restless, fio2 increased to 100% during desat to 72%, biting the ETT. Currently dio2 decreased back to 50% and patient is sedated with bite block in place.

## 2015-05-20 NOTE — Progress Notes (Signed)
PULMONARY / CRITICAL CARE MEDICINE   Name: Julia Massey MRN: [id number] DOB: 07-07-1950    ADMISSION DATE:  05/14/2015 CONSULTATION DATE:  05/13/14  REFERRING MD:  [facility]   CHIEF COMPLAINT:  SOB  SUBJECTIVE: ativan and CIWA protocol started last night for agitation, diaphoresis. Patient on Pressure control now, tolerating well.  VITAL SIGNS: BP 132/120 mmHg  Pulse 67  Temp(Src) 100.2 F (37.9 C) (Core (Comment))  Resp 15  Ht 5' (1.524 m)  Wt 132 lb 15 oz (60.3 kg)  BMI 25.96 kg/m2  SpO2 96%  HEMODYNAMICS: CVP:  [11 mmHg] 11 mmHg  VENTILATOR SETTINGS: Vent Mode:  [-] PCV FiO2 (%):  [40 %-80 %] 40 % Set Rate:  [14 bmp] 14 bmp Vt Set:  [370 mL] 370 mL PEEP:  [5 cmH20-8 cmH20] 5 cmH20 Plateau Pressure:  [23 cmH20-25 cmH20] 24 cmH20  INTAKE / OUTPUT: I/O last 3 completed shifts: In: 3091 [I.V.:1596; NG/GT:1345; IV Piggyback:150] Out: 3940 [Urine:3940]   PHYSICAL EXAMINATION: General: Adult female, sedated Neuro: sedated, minimal response to sternal rub HEENT: PERRL, NCAT, ETT in place PULM: clear bilaterally, vent assisted breathing, no wheezing CV: tachycardic regular, no mgr GI: soft, hypoactive bowel sounds MSK: no edema Derm: no cyanosis, no rash or skin breakdown  LABS:  BMET  Recent Labs Lab 05/18/15 0445 05/19/15 0424 05/20/15 0440  NA 145 146* 142  K 3.9 4.6 4.4  CL 103 99* 96*  CO2 36* 39* 38*  BUN 30* 24* 19  CREATININE 0.42* 0.45 0.36*  GLUCOSE 150* 163* 173*    Electrolytes  Recent Labs Lab 05/18/15 0445 05/19/15 0424 05/20/15 0440  CALCIUM 8.4* 8.5* 8.3*  MG 2.1 2.1  --   PHOS 2.1* 3.0  --     CBC  Recent Labs Lab 05/17/15 0518 05/18/15 0445 05/20/15 0440  WBC 9.6 12.0* 9.9  HGB 9.3* 9.1* 9.7*  HCT 30.9* 31.0* 31.5*  PLT 302 297 252    Coag's  Recent Labs Lab 05/15/15 1425  APTT 33  INR 1.31    Sepsis Markers  Recent Labs Lab 05/14/15 0334 05/16/15 0530 05/18/15 0445   LATICACIDVEN 2.0  --   --   PROCALCITON 0.10 0.11 <0.10    ABG  Recent Labs Lab 05/19/15 0416 05/19/15 0523 05/20/15 0352  PHART 7.177* 7.442 7.469*  PCO2ART 42.5 61.3* 51.9*  PO2ART 68.9* 136.0* 189*    Liver Enzymes  Recent Labs Lab 05/14/15 0334 05/17/15 0518  AST 27 14*  ALT 16 18  ALKPHOS 119 96  BILITOT 0.6 0.1*  ALBUMIN 2.4* 2.0*    Cardiac Enzymes  Recent Labs Lab 05/14/15 0334  TROPONINI 0.15*    Glucose  Recent Labs Lab 05/19/15 1148 05/19/15 1555 05/19/15 2015 05/20/15 0021 05/20/15 0401 05/20/15 0821  GLUCAP 128* 110* 153* 150* 161* 143*    Imaging 1/7 CXR > improving bilateral infiltrates, ETT in place   STUDIES:  CTA 01/03 > no PE.  Extensive bilateral patchy pulmonary infiltrates c/w atypical PNA.  Additional peripheral pulmonary fibrosis at both lung bases.  Mediastinal and hilar adenopathy, likely reactive. CTA 01/05 >>  1. No evidence pulmonary embolism.  2. Cardiomegaly with right atrial enlargement, mild left ventricular  hypertrophy and right ventricular hypertrophy. Mild left main and  LAD coronary atherosclerosis.  3. Extensive ground-glass opacity throughout both lungs with  scattered confluent airspace opacities peripherally in both lungs.  ARDS suspected. The peripheral airspace opacities raise the  possibility eosinophilic pneumonia.  4. Likely reactive  mediastinal and right hilar lymphadenopathy. CT neck 01/05 > Significant periodontal disease. No features suggestive of mandibular osteomyelitis. Assessment of pharyngeal masses and mucosal surfaces is limited but no retropharyngeal or parapharyngeal abscess is observed. Cervical lymph node prominence is incompletely evaluated. No suppurative lymph node mass is evident  TTE 01/05:  - Left ventricle: The cavity size was normal. Wall thickness was normal. Systolic function was normal. The estimated ejection fraction was in the range of 55% to 60%. Wall motion was  normal; there were no regional wall motion abnormalities. Doppler parameters are consistent with abnormal left ventricular relaxation (grade 1 diastolic dysfunction). - Ventricular septum: The contour showed diastolic flattening and systolic flattening. - Right ventricle: The cavity size was moderately dilated. Systolic function was severely reduced. - Right atrium: The atrium was mildly dilated. - Pulmonary arteries: Systolic pressure was moderately increased. PA peak pressure: 52 mm Hg (S). - Pericardium, extracardiac: A trivial pericardial effusion was identified.   CULTURES: Flu dec 28th>>>neg Blood 01/04 > Urine 01/04 > neg Sputum 01/04 > nl flora U. Strep 01/04 >neg U. Legionella 01/04 >neg BAL 01/05> culture pending NGTD, gram stain moderate wbc (mostly pmn) no organisms  Cell diff>>>93% neutro, no eos Rvp>>>neg  ANTIBIOTICS: Vanc 01/04 >>>1/7 Zosyn 01/04 >>>1/7 Azithro 01/04 >>>1/5 Levofloxacin 1/5>>>  SIGNIFICANT EVENTS: 01/04 > admitted with acute hypoxic respiratory failure and concern for atypical PNA. 1/5- distress , on bipap --> intubated, bronchoscopy, a-line and central line placed  ASSESSMENT / PLAN:  PULMONARY A: ARDS> better Former smoker P:   KVO fluids Continue full vent support Continue bronchodilators Wean solumedrol RR 14 now ABG AM CXR AM  INFECTIOUS A:   Concern for atypical PNA vs post viral pneumonitis (pct low) R/o viral panel / flu P:   Continue levaquin for now Respiratory viral panel negative  CARDIOVASCULAR A:  Elevated BNP (1252) - no hx of heart failure, not volume overloaded on exam Mild troponin leak Hx HLD pulm htn pa 52 > due to hypoxemia P:  Continue outpatient crestor Map goal 60  RENAL  Recent Labs Lab 05/18/15 0445 05/19/15 0424 05/20/15 0440  K 3.9 4.6 4.4     A:   Hypokalemia -> improved Hypophosphatemia -> improved P:   Monitor BMET and UOP Replace electrolytes as  needed   GASTROINTESTINAL A:   Nutrition P:   TF as tolerated  ppi  HEMATOLOGIC A:   VTE Prophylaxis. Leukocytosis ->resolved P:  SCD's / Heparin CBC  CTA neg for PE  ENDOCRINE A:   Rel AI likely  P:   Monitor glucose on BMP Steroids, weaning  NEUROLOGIC A:   Hx anxiety / depression Severe ARDS  P:   Continue outpatient fluoxetine outpatient norco scheduled prn, was held last several days. Versed and Fentanyl reduced yesterday D/c'ed Propofol, added precedex yesterday for agitation Patient placed on CIWA protocol w/ prn Ativan last night     Darreld Mclean Internal Medicine PGY1 Pager: [phone]

## 2015-05-21 ENCOUNTER — Inpatient Hospital Stay (HOSPITAL_COMMUNITY): Payer: BLUE CROSS/BLUE SHIELD

## 2015-05-21 LAB — CBC
HEMATOCRIT: 32.3 % — AB (ref 36.0–46.0)
Hemoglobin: 9.8 g/dL — ABNORMAL LOW (ref 12.0–15.0)
MCH: 29 pg (ref 26.0–34.0)
MCHC: 30.3 g/dL (ref 30.0–36.0)
MCV: 95.6 fL (ref 78.0–100.0)
Platelets: 240 10*3/uL (ref 150–400)
RBC: 3.38 MIL/uL — AB (ref 3.87–5.11)
RDW: 13.4 % (ref 11.5–15.5)
WBC: 12.3 10*3/uL — AB (ref 4.0–10.5)

## 2015-05-21 LAB — BASIC METABOLIC PANEL
ANION GAP: 7 (ref 5–15)
BUN: 18 mg/dL (ref 6–20)
CHLORIDE: 93 mmol/L — AB (ref 101–111)
CO2: 35 mmol/L — AB (ref 22–32)
Calcium: 8 mg/dL — ABNORMAL LOW (ref 8.9–10.3)
Creatinine, Ser: 0.41 mg/dL — ABNORMAL LOW (ref 0.44–1.00)
GFR calc Af Amer: >60 mL/min (ref >60)
GFR calc non Af Amer: >60 mL/min (ref >60)
GLUCOSE: 208 mg/dL — AB (ref 65–99)
POTASSIUM: 4.2 mmol/L (ref 3.5–5.1)
Sodium: 135 mmol/L (ref 135–145)

## 2015-05-21 LAB — GLUCOSE, CAPILLARY
GLUCOSE-CAPILLARY: 161 mg/dL — AB (ref 65–99)
Glucose-Capillary: 195 mg/dL — ABNORMAL HIGH (ref 65–99)
Glucose-Capillary: 184 mg/dL — ABNORMAL HIGH (ref 65–99)
Glucose-Capillary: 132 mg/dL — ABNORMAL HIGH (ref 65–99)
Glucose-Capillary: 117 mg/dL — ABNORMAL HIGH (ref 65–99)
Glucose-Capillary: 126 mg/dL — ABNORMAL HIGH (ref 65–99)

## 2015-05-21 MED ORDER — DOCUSATE SODIUM 50 MG/5ML PO LIQD: 100.0000 mg | Freq: Two times a day (BID) | ORAL | Status: DC

## 2015-05-21 MED ORDER — FUROSEMIDE 10 MG/ML IJ SOLN
40.0000 mg | Freq: Four times a day (QID) | INTRAMUSCULAR | Status: AC
  Administered 2015-05-21 (×2): 40 mg via INTRAVENOUS
  Filled 2015-05-21 (×2): qty 4

## 2015-05-21 MED ORDER — MIDAZOLAM HCL 2 MG/2ML IJ SOLN
1.0000 mg | INTRAMUSCULAR | Status: DC | PRN
  Administered 2015-05-21 – 2015-05-22 (×5): 2 mg via INTRAVENOUS
  Administered 2015-05-22: 1 mg via INTRAVENOUS
  Filled 2015-05-21 (×6): qty 2

## 2015-05-21 MED ORDER — INSULIN ASPART 100 UNIT/ML ~~LOC~~ SOLN: 0.0000 [IU] | Freq: Every day | SUBCUTANEOUS | Status: DC

## 2015-05-21 MED ORDER — VITAMIN B-1 100 MG PO TABS
100.0000 mg | ORAL_TABLET | Freq: Every day | ORAL | Status: DC
  Administered 2015-05-23 – 2015-05-26 (×4): 100 mg via NASOGASTRIC
  Filled 2015-05-21 (×7): qty 1

## 2015-05-21 MED ORDER — INSULIN ASPART 100 UNIT/ML ~~LOC~~ SOLN
0.0000 [IU] | Freq: Three times a day (TID) | SUBCUTANEOUS | Status: DC
  Administered 2015-05-21: 1 [IU] via SUBCUTANEOUS
  Administered 2015-05-22 – 2015-05-23 (×3): 2 [IU] via SUBCUTANEOUS

## 2015-05-21 MED ORDER — RISPERIDONE 1 MG/ML PO SOLN
1.0000 mg | Freq: Two times a day (BID) | ORAL | Status: DC
  Administered 2015-05-21 – 2015-05-23 (×4): 1 mg via ORAL
  Filled 2015-05-21 (×6): qty 1

## 2015-05-21 MED ORDER — ACETAMINOPHEN 160 MG/5ML PO SOLN: 650.0000 mg | Freq: Four times a day (QID) | ORAL | Status: DC | PRN

## 2015-05-21 MED ORDER — POLYETHYLENE GLYCOL 3350 17 G PO PACK
17.0000 g | PACK | Freq: Every day | ORAL | Status: DC
  Administered 2015-05-21 – 2015-05-30 (×8): 17 g via ORAL
  Filled 2015-05-21 (×9): qty 1

## 2015-05-21 MED ORDER — POTASSIUM CHLORIDE 20 MEQ/15ML (10%) PO SOLN
40.0000 meq | Freq: Once | ORAL | Status: AC
  Administered 2015-05-21: 40 meq
  Filled 2015-05-21: qty 30

## 2015-05-21 MED ORDER — THIAMINE HCL 100 MG/ML IJ SOLN
100.0000 mg | Freq: Every day | INTRAMUSCULAR | Status: DC
  Administered 2015-05-21 – 2015-05-28 (×4): 100 mg via INTRAVENOUS
  Filled 2015-05-21 (×2): qty 1
  Filled 2015-05-21: qty 2
  Filled 2015-05-21 (×5): qty 1

## 2015-05-21 MED ORDER — HALOPERIDOL LACTATE 5 MG/ML IJ SOLN
1.0000 mg | INTRAMUSCULAR | Status: DC | PRN
  Administered 2015-05-21 – 2015-05-24 (×4): 4 mg via INTRAVENOUS
  Filled 2015-05-21 (×4): qty 1

## 2015-05-21 MED ORDER — MIDAZOLAM HCL 2 MG/2ML IJ SOLN
1.0000 mg | INTRAMUSCULAR | Status: DC | PRN
  Administered 2015-05-21 (×2): 2 mg via INTRAVENOUS
  Filled 2015-05-21 (×2): qty 2

## 2015-05-21 MED ORDER — SENNOSIDES 8.8 MG/5ML PO SYRP
5.0000 mL | ORAL_SOLUTION | Freq: Two times a day (BID) | ORAL | Status: DC | PRN
  Administered 2015-05-24: 5 mL
  Filled 2015-05-21 (×2): qty 5

## 2015-05-21 MED ORDER — FOLIC ACID 1 MG PO TABS
1.0000 mg | ORAL_TABLET | Freq: Every day | ORAL | Status: DC
  Administered 2015-05-21 – 2015-05-28 (×7): 1 mg
  Filled 2015-05-21 (×8): qty 1

## 2015-05-21 NOTE — Care Management Note (Signed)
Case Management Note  Patient Details  Name: Julia Massey MRN: [id number] Date of Birth: 28-Jan-1951  Subjective/Objective:     Talked with daughter, Julia Massey and friend in room.  Daughter states prior to admission lives with her husband and was independent.  Remains intubated.  CM will continue to follow.                Action/Plan:   Expected Discharge Date:                  Expected Discharge Plan:  Home/Self Care  In-House Referral:     Discharge planning Services  CM Consult  Post Acute Care Choice:    Choice offered to:     DME Arranged:    DME Agency:     HH Arranged:    HH Agency:     Status of Service:  In process, will continue to follow  Medicare Important Message Given:    Date Medicare IM Given:    Medicare IM give by:    Date Additional Medicare IM Given:    Additional Medicare Important Message give by:     If discussed at Long Length of Stay Meetings, dates discussed:    Additional Comments:  Vangie Bicker, RN 05/21/2015, 10:51 AM

## 2015-05-21 NOTE — Progress Notes (Signed)
Nutrition Follow-up  DOCUMENTATION CODES:   Non-severe (moderate) malnutrition in context of acute illness/injury  INTERVENTION:    Continue Oxepa at 35 ml/h (840 ml per day) with PS 30 ml BID to provide 1460, 83 gm protein, 659 ml free water daily.  NUTRITION DIAGNOSIS:   Inadequate oral intake related to inability to eat as evidenced by NPO status.  Ongoing  GOAL:   Patient will meet greater than or equal to 90% of their needs  Met  MONITOR:   Vent status, Labs, Weight trends, TF tolerance, I & O's  ASSESSMENT:   65 y.o. female with PMH of HLD, anxiety, depression, and COPD (never had formal PFT's, was only told she had COPD per CXR report). She began to feel sick around christmas time 2016 with cough productive of thick green sputum and fevers (Tmax 102) / chills. She saw her PCP who placed her on a z- pack which she completed 2 days ago. She apparently was initially feeling somewhat better, then began to have worsening SOB 1 day ago.  Discussed patient in ICU rounds and with RN today. Patient is currently receiving Oxepa via OGT at 35 ml/h (840 ml/day) with Prostat 30 ml BID to provide 1460 kcals, 83 gm protein, 659 ml free water daily.   Patient is currently intubated on ventilator support MV: 8.1 L/min Temp (24hrs), Avg:100.4 F (38 C), Min:99.7 F (37.6 C), Max:100.9 F (38.3 C)   Diet Order:  Diet NPO time specified  Skin:  Reviewed, no issues  Last BM:  1/3  Height:   Ht Readings from Last 1 Encounters:  05/21/15 5' (1.524 m)    Weight:   Wt Readings from Last 1 Encounters:  05/21/15 131 lb 9.8 oz (59.7 kg)    Ideal Body Weight:  45.5 kg  BMI:  Body mass index is 25.7 kg/(m^2).  Estimated Nutritional Needs:   Kcal:  2409  Protein:  80-90 gm  Fluid:  1.6 L  EDUCATION NEEDS:   No education needs identified at this time  Molli Barrows, [place], [place], [place] Pager [phone] After Hours Pager [phone]

## 2015-05-21 NOTE — Progress Notes (Signed)
PULMONARY / CRITICAL CARE MEDICINE   Name: Julia Massey MRN: [id number] DOB: [birth year]    ADMISSION DATE:  05/14/2015 CONSULTATION DATE:  05/13/14  REFERRING MD:  [facility]   SUBJECTIVE: sedated on vent, frequent medication needed for agitation  VITAL SIGNS: BP 126/114 mmHg  Pulse 110  Temp(Src) 99.7 F (37.6 C) (Core (Comment))  Resp 25  Ht 5' (1.524 m)  Wt 131 lb 9.8 oz (59.7 kg)  BMI 25.70 kg/m2  SpO2 94%  HEMODYNAMICS:    VENTILATOR SETTINGS: Vent Mode:  [-] PCV FiO2 (%):  [40 %-50 %] 50 % Set Rate:  [14 bmp] 14 bmp PEEP:  [5 cmH20] 5 cmH20 Plateau Pressure:  [19 cmH20-24 cmH20] 19 cmH20  INTAKE / OUTPUT: I/O last 3 completed shifts: In: 2952.4 [I.V.:1577.4; NG/GT:1225; IV Piggyback:150] Out: 4785 [Urine:4785]   PHYSICAL EXAMINATION: General: Adult female, sedated Neuro: sedated, minimal response to sternal rub HEENT: PERRL, NCAT, ETT in place PULM: clear bilaterally, vent assisted breathing, no wheezing CV: RRR, no mgr GI: soft, hypoactive bowel sounds MSK: no edema Derm: no cyanosis, no rash or skin breakdown  LABS:  BMET  Recent Labs Lab 05/19/15 0424 05/20/15 0440 05/21/15 0433  NA 146* 142 135  K 4.6 4.4 4.2  CL 99* 96* 93*  CO2 39* 38* 35*  BUN 24* 19 18  CREATININE 0.45 0.36* 0.41*  GLUCOSE 163* 173* 208*    Electrolytes  Recent Labs Lab 05/18/15 0445 05/19/15 0424 05/20/15 0440 05/21/15 0433  CALCIUM 8.4* 8.5* 8.3* 8.0*  MG 2.1 2.1  --   --   PHOS 2.1* 3.0  --   --     CBC  Recent Labs Lab 05/18/15 0445 05/20/15 0440 05/21/15 0433  WBC 12.0* 9.9 12.3*  HGB 9.1* 9.7* 9.8*  HCT 31.0* 31.5* 32.3*  PLT 297 252 240    Coag's  Recent Labs Lab 05/15/15 1425  APTT 33  INR 1.31    Sepsis Markers  Recent Labs Lab 05/16/15 0530 05/18/15 0445  PROCALCITON 0.11 <0.10    ABG  Recent Labs Lab 05/19/15 0416 05/19/15 0523 05/20/15 0352  PHART 7.177* 7.442 7.469*  PCO2ART 42.5 61.3*  51.9*  PO2ART 68.9* 136.0* 189*    Liver Enzymes  Recent Labs Lab 05/17/15 0518  AST 14*  ALT 18  ALKPHOS 96  BILITOT 0.1*  ALBUMIN 2.0*    Cardiac Enzymes No results for input(s): TROPONINI, PROBNP in the last 168 hours.  Glucose  Recent Labs Lab 05/20/15 0821 05/20/15 1149 05/20/15 1554 05/20/15 2018 05/20/15 2358 05/21/15 0413  GLUCAP 143* 138* 127* 131* 161* 195*    Imaging 1/7 CXR > improving bilateral infiltrates, ETT in place   STUDIES:  CTA 01/03 > no PE.  Extensive bilateral patchy pulmonary infiltrates c/w atypical PNA.  Additional peripheral pulmonary fibrosis at both lung bases.  Mediastinal and hilar adenopathy, likely reactive. CTA 01/05 >>  1. No evidence pulmonary embolism.  2. Cardiomegaly with right atrial enlargement, mild left ventricular  hypertrophy and right ventricular hypertrophy. Mild left main and  LAD coronary atherosclerosis.  3. Extensive ground-glass opacity throughout both lungs with  scattered confluent airspace opacities peripherally in both lungs.  ARDS suspected. The peripheral airspace opacities raise the  possibility eosinophilic pneumonia.  4. Likely reactive mediastinal and right hilar lymphadenopathy. CT neck 01/05 > Significant periodontal disease. No features suggestive of mandibular osteomyelitis. Assessment of pharyngeal masses and mucosal surfaces is limited but no retropharyngeal or parapharyngeal abscess is observed. Cervical lymph  node prominence is incompletely evaluated. No suppurative lymph node mass is evident  TTE 01/05:  - Left ventricle: The cavity size was normal. Wall thickness was normal. Systolic function was normal. The estimated ejection fraction was in the range of 55% to 60%. Wall motion was normal; there were no regional wall motion abnormalities. Doppler parameters are consistent with abnormal left ventricular relaxation (grade 1 diastolic dysfunction). - Ventricular septum: The  contour showed diastolic flattening and systolic flattening. - Right ventricle: The cavity size was moderately dilated. Systolic function was severely reduced. - Right atrium: The atrium was mildly dilated. - Pulmonary arteries: Systolic pressure was moderately increased. PA peak pressure: 52 mm Hg (S). - Pericardium, extracardiac: A trivial pericardial effusion was identified.   CULTURES: Flu dec 28th>>>neg Blood 01/04 > NGTD final x2 Urine 01/04 > neg Sputum 01/04 > nl flora U. Strep 01/04 >neg U. Legionella 01/04 >neg BAL 01/05> culture pending NGTD, gram stain moderate wbc (mostly pmn) no organisms  Cell diff>>>93% neutro, no eos Rvp>>>neg  ANTIBIOTICS: Vanc 01/04 >>>1/7 Zosyn 01/04 >>>1/7 Azithro 01/04 >>>1/5 Levofloxacin 1/5>>>  SIGNIFICANT EVENTS: 01/04 > admitted with acute hypoxic respiratory failure and concern for atypical PNA. 1/5- distress , on bipap --> intubated, bronchoscopy, a-line and central line placed  ASSESSMENT / PLAN:  PULMONARY A: ARDS> better Former smoker P:   KVO fluids Continue vent support, pressure control, wean as tolerated, possible extubation in next couple of days? SBTs Continue bronchodilators Wean solumedrol CXR AM  INFECTIOUS A:   Concern for atypical PNA vs post viral pneumonitis (pct low) R/o viral panel / flu P:   Continue levaquin to finish 7 day course Respiratory viral panel negative Cultures negative CXR worsening, new fever, worried about new PNA will reculture  CARDIOVASCULAR A:  Elevated BNP (1252) - no hx of heart failure, not volume overloaded on exam Mild troponin leak Hx HLD pulm htn pa 52 > due to hypoxemia P:  Continue outpatient crestor Map goal 60  RENAL A:   Hypokalemia -> improved Hypophosphatemia -> improved P:   Monitor BMET and UOP Replace electrolytes as needed   GASTROINTESTINAL A:   Nutrition P:   TF as tolerated  ppi Senna Miralax Colace  HEMATOLOGIC A:   VTE  Prophylaxis. Leukocytosis ->resolved P:  SCD's / Heparin CBC  CTA neg for PE  ENDOCRINE A:   Rel AI likely  P:   Monitor glucose on BMP Steroids, weaning  NEUROLOGIC A:   Hx anxiety / depression Severe ARDS  ? Alcohol withdrawal P:   Continue outpatient fluoxetine outpatient norco scheduled prn Versed, Fentanyl Precedex, increase cap D/c'ed CIWA, limiting benzos Add Haldol and Risperdal, try Haldol first    Darreld Mclean Internal Medicine PGY1 Pager: [phone]

## 2015-05-22 ENCOUNTER — Inpatient Hospital Stay (HOSPITAL_COMMUNITY): Payer: BLUE CROSS/BLUE SHIELD

## 2015-05-22 LAB — POCT I-STAT 3, ART BLOOD GAS (G3+)
ACID-BASE EXCESS: 15 mmol/L — AB (ref 0.0–2.0)
Acid-Base Excess: 12 mmol/L — ABNORMAL HIGH (ref 0.0–2.0)
BICARBONATE: 38.8 meq/L — AB (ref 20.0–24.0)
Bicarbonate: 39.8 mEq/L — ABNORMAL HIGH (ref 20.0–24.0)
O2 SAT: 99 %
O2 Saturation: 99 %
PCO2 ART: 51.7 mmHg — AB (ref 35.0–45.0)
PCO2 ART: 56.7 mmHg — AB (ref 35.0–45.0)
PH ART: 7.495 — AB (ref 7.350–7.450)
PO2 ART: 130 mmHg — AB (ref 80.0–100.0)
PO2 ART: 126 mmHg — AB (ref 80.0–100.0)
Patient temperature: 98.7
Patient temperature: 98.6
TCO2: 41 mmol/L (ref 0–100)
TCO2: 40 mmol/L (ref 0–100)
pH, Arterial: 7.443 (ref 7.350–7.450)

## 2015-05-22 LAB — GLUCOSE, CAPILLARY
GLUCOSE-CAPILLARY: 213 mg/dL — AB (ref 65–99)
GLUCOSE-CAPILLARY: 169 mg/dL — AB (ref 65–99)
GLUCOSE-CAPILLARY: 130 mg/dL — AB (ref 65–99)
Glucose-Capillary: 111 mg/dL — ABNORMAL HIGH (ref 65–99)
Glucose-Capillary: 155 mg/dL — ABNORMAL HIGH (ref 65–99)
Glucose-Capillary: 178 mg/dL — ABNORMAL HIGH (ref 65–99)

## 2015-05-22 LAB — BASIC METABOLIC PANEL
Anion gap: 7 (ref 5–15)
BUN: 18 mg/dL (ref 6–20)
CHLORIDE: 89 mmol/L — AB (ref 101–111)
CO2: 37 mmol/L — AB (ref 22–32)
CREATININE: 0.43 mg/dL — AB (ref 0.44–1.00)
Calcium: 8.2 mg/dL — ABNORMAL LOW (ref 8.9–10.3)
GFR calc Af Amer: >60 mL/min (ref >60)
GFR calc non Af Amer: >60 mL/min (ref >60)
Glucose, Bld: 254 mg/dL — ABNORMAL HIGH (ref 65–99)
POTASSIUM: 4.1 mmol/L (ref 3.5–5.1)
SODIUM: 133 mmol/L — AB (ref 135–145)

## 2015-05-22 LAB — CBC
HEMATOCRIT: 32.8 % — AB (ref 36.0–46.0)
HEMOGLOBIN: 10.2 g/dL — AB (ref 12.0–15.0)
MCH: 28.9 pg (ref 26.0–34.0)
MCHC: 31.1 g/dL (ref 30.0–36.0)
MCV: 92.9 fL (ref 78.0–100.0)
Platelets: 156 10*3/uL (ref 150–400)
RBC: 3.53 MIL/uL — AB (ref 3.87–5.11)
RDW: 13.4 % (ref 11.5–15.5)
WBC: 12.4 10*3/uL — ABNORMAL HIGH (ref 4.0–10.5)

## 2015-05-22 MED ORDER — CHLORHEXIDINE GLUCONATE 0.12 % MT SOLN: 15.0000 mL | Freq: Two times a day (BID) | OROMUCOSAL | Status: DC

## 2015-05-22 MED ORDER — ETOMIDATE 2 MG/ML IV SOLN
20.0000 mg | Freq: Once | INTRAVENOUS | Status: AC
  Administered 2015-05-22: 20 mg via INTRAVENOUS

## 2015-05-22 MED ORDER — FUROSEMIDE 10 MG/ML IJ SOLN
40.0000 mg | Freq: Four times a day (QID) | INTRAMUSCULAR | Status: AC
  Administered 2015-05-22 (×2): 40 mg via INTRAVENOUS
  Filled 2015-05-22 (×2): qty 4

## 2015-05-22 MED ORDER — FENTANYL CITRATE (PF) 100 MCG/2ML IJ SOLN: 100.0000 ug | INTRAMUSCULAR | Status: DC | PRN

## 2015-05-22 MED ORDER — BISACODYL 10 MG RE SUPP
10.0000 mg | Freq: Every day | RECTAL | Status: DC | PRN
  Administered 2015-05-22: 10 mg via RECTAL
  Filled 2015-05-22: qty 1

## 2015-05-22 MED ORDER — ROCURONIUM BROMIDE 50 MG/5ML IV SOLN
60.0000 mg | Freq: Once | INTRAVENOUS | Status: AC
  Administered 2015-05-22: 60 mg via INTRAVENOUS
  Filled 2015-05-22: qty 6

## 2015-05-22 MED ORDER — MIDAZOLAM HCL 5 MG/ML IJ SOLN
0.0000 mg/h | INTRAMUSCULAR | Status: DC
  Administered 2015-05-22 – 2015-05-23 (×2): 2 mg/h via INTRAVENOUS
  Administered 2015-05-24: 1 mg/h via INTRAVENOUS
  Filled 2015-05-22 (×3): qty 10

## 2015-05-22 MED ORDER — MIDAZOLAM HCL 2 MG/2ML IJ SOLN
1.0000 mg | INTRAMUSCULAR | Status: DC | PRN
  Administered 2015-05-22: 2 mg via INTRAVENOUS
  Administered 2015-05-24: 1 mg via INTRAVENOUS
  Filled 2015-05-22 (×2): qty 2

## 2015-05-22 MED ORDER — SODIUM CHLORIDE 0.9 % IV SOLN
25.0000 ug/h | INTRAVENOUS | Status: DC
  Administered 2015-05-22 (×2): 200 ug/h via INTRAVENOUS
  Administered 2015-05-23 – 2015-05-25 (×2): 100 ug/h via INTRAVENOUS
  Filled 2015-05-22 (×3): qty 50

## 2015-05-22 MED ORDER — FENTANYL CITRATE (PF) 100 MCG/2ML IJ SOLN
12.5000 ug | INTRAMUSCULAR | Status: DC | PRN
  Administered 2015-05-22: 25 ug via INTRAVENOUS
  Filled 2015-05-22: qty 2

## 2015-05-22 MED ORDER — MIDAZOLAM HCL 2 MG/2ML IJ SOLN: 1.0000 mg | Freq: Once | INTRAMUSCULAR | Status: DC

## 2015-05-22 MED ORDER — MIDAZOLAM HCL 2 MG/2ML IJ SOLN
1.0000 mg | INTRAMUSCULAR | Status: DC | PRN
  Administered 2015-05-22 (×2): 2 mg via INTRAVENOUS
  Filled 2015-05-22 (×3): qty 2

## 2015-05-22 MED ORDER — CETYLPYRIDINIUM CHLORIDE 0.05 % MT LIQD: 7.0000 mL | Freq: Two times a day (BID) | OROMUCOSAL | Status: DC

## 2015-05-22 MED ORDER — POTASSIUM CHLORIDE 10 MEQ/50ML IV SOLN
10.0000 meq | INTRAVENOUS | Status: AC
  Administered 2015-05-22 (×4): 10 meq via INTRAVENOUS
  Filled 2015-05-22 (×4): qty 50

## 2015-05-22 MED ORDER — DEXTROSE 5 % IV SOLN
0.0000 ug/min | INTRAVENOUS | Status: DC
  Administered 2015-05-22: 30 ug/min via INTRAVENOUS
  Filled 2015-05-22: qty 1

## 2015-05-22 MED ORDER — MIDAZOLAM HCL 2 MG/2ML IJ SOLN
1.0000 mg | Freq: Once | INTRAMUSCULAR | Status: AC
  Administered 2015-05-22: 1 mg via INTRAVENOUS

## 2015-05-22 MED ORDER — DEXTROSE 5 % IV SOLN
0.0000 ug/min | INTRAVENOUS | Status: DC
  Administered 2015-05-22: 50 ug/min via INTRAVENOUS
  Administered 2015-05-23: 110 ug/min via INTRAVENOUS
  Administered 2015-05-23: 120 ug/min via INTRAVENOUS
  Filled 2015-05-22 (×3): qty 4

## 2015-05-22 NOTE — Progress Notes (Signed)
[facility] PCCM  Ms. Mannan has been agitated all day long, requiring versed frequently.  Will follow commands and will nod head appropriately, but diaphoretic.  Initially she would respond to versed and would breathe comfortably, but she is now breathing about 35 times a minute with a loud upper airway rhonchi.  She is oxygenating well but I fear she will not be able to sustain this level of breathing.   So I called Melissa and let her know we needed to re-intubate, she voiced understanding.  Additional cc time 40 minutes  Heber [place], MD [place] [facility]M Pager: [phone] Cell: [phone] After 3pm or if no response, call [phone]

## 2015-05-22 NOTE — Procedures (Signed)
Extubation Procedure Note  Patient Details:   Name: Julia Massey DOB: December 01, 1950 MRN: [id number]   Airway Documentation:     Evaluation  O2 sats: stable throughout Complications: No apparent complications Patient did tolerate procedure well. Bilateral Breath Sounds: Clear Suctioning: Airway Yes   Patient extubated to 55% venturi mask per MD order.  Positive cuff leak noted.  No evidence of stridor.  Patient able to speak post extubation.  No apparent complications.    Durwin Glaze 05/22/2015, 9:28 AM

## 2015-05-22 NOTE — Progress Notes (Signed)
PULMONARY / CRITICAL CARE MEDICINE   Name: Julia Massey MRN: [id number] DOB: 10-02-1950    ADMISSION DATE:  05/14/2015 CONSULTATION DATE:  05/13/14  REFERRING MD:  [facility]   SUBJECTIVE:  Still requiring versed overnight for sedation Oxygenating better Put out 3.7 liters overnight Passing SBT  VITAL SIGNS: BP 107/63 mmHg  Pulse 102  Temp(Src) 99 F (37.2 C) (Oral)  Resp 18  Ht 5' (1.524 m)  Wt 124 lb 12.5 oz (56.6 kg)  BMI 24.37 kg/m2  SpO2 100%  HEMODYNAMICS:    VENTILATOR SETTINGS: Vent Mode:  [-] PSV;CPAP FiO2 (%):  [40 %-50 %] 40 % Set Rate:  [14 bmp] 14 bmp PEEP:  [5 cmH20] 5 cmH20 Pressure Support:  [5 cmH20] 5 cmH20 Plateau Pressure:  [22 cmH20-27 cmH20] 26 cmH20  INTAKE / OUTPUT: I/O last 3 completed shifts: In: 3511.9 [P.O.:3; I.V.:2103.9; NG/GT:1255; IV Piggyback:150] Out: 7495 [Urine:7495]   PHYSICAL EXAMINATION: General: Adult female, sedated on vent Neuro: drowsy on vent, sedated HEENT: NCAT, ETT in place PULM: clear bilaterally, vent supported breaths CV: RRR, no mgr GI: soft, hypoactive bowel sounds MSK: no edema Derm: no cyanosis, no rash or skin breakdown  LABS:  BMET  Recent Labs Lab 05/20/15 0440 05/21/15 0433 05/22/15 0437  NA 142 135 133*  K 4.4 4.2 4.1  CL 96* 93* 89*  CO2 38* 35* 37*  BUN 19 18 18   CREATININE 0.36* 0.41* 0.43*  GLUCOSE 173* 208* 254*    Electrolytes  Recent Labs Lab 05/18/15 0445 05/19/15 0424 05/20/15 0440 05/21/15 0433 05/22/15 0437  CALCIUM 8.4* 8.5* 8.3* 8.0* 8.2*  MG 2.1 2.1  --   --   --   PHOS 2.1* 3.0  --   --   --     CBC  Recent Labs Lab 05/20/15 0440 05/21/15 0433 05/22/15 0437  WBC 9.9 12.3* 12.4*  HGB 9.7* 9.8* 10.2*  HCT 31.5* 32.3* 32.8*  PLT 252 240 156    Coag's  Recent Labs Lab 05/15/15 1425  APTT 33  INR 1.31    Sepsis Markers  Recent Labs Lab 05/16/15 0530 05/18/15 0445  PROCALCITON 0.11 <0.10    ABG  Recent Labs Lab  05/19/15 0523 05/20/15 0352 05/22/15 0415  PHART 7.442 7.469* 7.495*  PCO2ART 61.3* 51.9* 51.7*  PO2ART 136.0* 189* 130.0*    Liver Enzymes  Recent Labs Lab 05/17/15 0518  AST 14*  ALT 18  ALKPHOS 96  BILITOT 0.1*  ALBUMIN 2.0*    Cardiac Enzymes No results for input(s): TROPONINI, PROBNP in the last 168 hours.  Glucose  Recent Labs Lab 05/21/15 1205 05/21/15 1559 05/21/15 2008 05/22/15 05/22/15 0333 05/22/15 0824  GLUCAP 132* 117* 126* 155* 213* 169*    Imaging 1/12 CXR > stable bilateral infiltrates, ETT in place   STUDIES:  CTA 01/03 > no PE.  Extensive bilateral patchy pulmonary infiltrates c/w atypical PNA.  Additional peripheral pulmonary fibrosis at both lung bases.  Mediastinal and hilar adenopathy, likely reactive. CTA 01/05 >>  1. No evidence pulmonary embolism.  2. Cardiomegaly with right atrial enlargement, mild left ventricular  hypertrophy and right ventricular hypertrophy. Mild left main and  LAD coronary atherosclerosis.  3. Extensive ground-glass opacity throughout both lungs with  scattered confluent airspace opacities peripherally in both lungs.  ARDS suspected. The peripheral airspace opacities raise the  possibility eosinophilic pneumonia.  4. Likely reactive mediastinal and right hilar lymphadenopathy. CT neck 01/05 > Significant periodontal disease. No features suggestive of mandibular  osteomyelitis. Assessment of pharyngeal masses and mucosal surfaces is limited but no retropharyngeal or parapharyngeal abscess is observed. Cervical lymph node prominence is incompletely evaluated. No suppurative lymph node mass is evident  TTE 01/05:  - Left ventricle: The cavity size was normal. Wall thickness was normal. Systolic function was normal. The estimated ejection fraction was in the range of 55% to 60%. Wall motion was normal; there were no regional wall motion abnormalities. Doppler parameters are consistent with abnormal left  ventricular relaxation (grade 1 diastolic dysfunction). - Ventricular septum: The contour showed diastolic flattening and systolic flattening. - Right ventricle: The cavity size was moderately dilated. Systolic function was severely reduced. - Right atrium: The atrium was mildly dilated. - Pulmonary arteries: Systolic pressure was moderately increased. PA peak pressure: 52 mm Hg (S). - Pericardium, extracardiac: A trivial pericardial effusion was identified.   CULTURES: Flu dec 28th>>>neg Blood 01/04 > NGTD final x2 Urine 01/04 > neg Sputum 01/04 > nl flora U. Strep 01/04 >neg U. Legionella 01/04 >neg BAL 01/05> culture pending NGTD, gram stain moderate wbc (mostly pmn) no organisms  Cell diff>>>93% neutro, no eos Rvp>>>neg 1/11 blood > NGTD 1/11 Resp > rare yeast  ANTIBIOTICS: Vanc 01/04 >>>1/7 Zosyn 01/04 >>>1/7 Azithro 01/04 >>>1/5 Levofloxacin 1/5>>> 1/12  SIGNIFICANT EVENTS: 01/04 > admitted with acute hypoxic respiratory failure and concern for atypical PNA. 1/5- distress , on bipap --> intubated, bronchoscopy, a-line and central line placed  ASSESSMENT / PLAN:  PULMONARY A: ARDS> better oxygenation but CXR with stable infiltrates; oxygenating well and passing SBT 1/12 AM Acute pulmonary edema? Former smoker P:   Lasix again today Rapid wean sedation and extubate Continue bronchodilators Continue solumedrol at current dose CXR AM  INFECTIOUS A:   Concern for atypical PNA vs post viral pneumonitis (pct low) P:   Stop levaquin today Follow cultures  CARDIOVASCULAR A:  Elevated BNP (1252) - ? Pulmonary edema secondary to IVF received in ICU Mild troponin leak Hx HLD pulm htn pa 52 > due to hypoxemia P:  Lasix again today Continue crestor Map goal 60  RENAL A:   No acute issues P:   Monitor BMET and UOP Replace electrolytes as needed Lasix x1 > give KCL   GASTROINTESTINAL A:   Nutrition constipation P:   Stop tube feed  protocol for extubation ppi Senna Miralax Colace Add dulcolax  HEMATOLOGIC A:   VTE Prophylaxis Leukocytosis ->resolved P:  SCD's / Heparin Monitor for bleeding  ENDOCRINE A:   ? Relative adrenal insufficiency P:   Monitor glucose on BMP Steroids, weaning as able  NEUROLOGIC A:   Hx anxiety / depression Acute encephalopathy> main issue keeping her on vent for the last several days? Alcohol withdrawal; follow commands 1/12 AM P:   Continue outpatient fluoxetine Fentanyl prn Versed prn, decrease frequency Continue risperdol Continue precedex for now Continue PRN fentanyl Prn haldol  My cc time 35 minutes  Heber [place], MD [place] [facility]M Pager: [phone] Cell: [phone] After 3pm or if no response, call [phone]

## 2015-05-22 NOTE — Procedures (Signed)
Intubation Procedure Note ZARIYAH STEPHENS [id number] [birth year]  Procedure: Intubation Indications: Airway protection and maintenance  Procedure Details Consent: Risks of procedure as well as the alternatives and risks of each were explained to the (patient/caregiver).  Consent for procedure obtained. Time Out: Verified patient identification, verified procedure, site/side was marked, verified correct patient position, special equipment/implants available, medications/allergies/relevent history reviewed, required imaging and test results available.  Performed  Drugs Etomidate 20, Rocuronium 60mg  IV DL x 1 with MAC 4 blade Grade 1 view 7.5 ET tube passed through cords under direct visualization Placement confirmed with bilateral breath sounds, positive EtCO2 change and smoke in tube   Evaluation Hemodynamic Status: BP stable throughout; O2 sats: stable throughout Patient's Current Condition: stable Complications: No apparent complications Patient did tolerate procedure well. Chest X-ray ordered to verify placement.  CXR: pending.   05/22/2015

## 2015-05-23 ENCOUNTER — Inpatient Hospital Stay (HOSPITAL_COMMUNITY): Payer: BLUE CROSS/BLUE SHIELD

## 2015-05-23 DIAGNOSIS — R4182 Altered mental status, unspecified: Secondary | ICD-10-CM | POA: Diagnosis present

## 2015-05-23 DIAGNOSIS — J9601 Acute respiratory failure with hypoxia: Secondary | ICD-10-CM | POA: Diagnosis present

## 2015-05-23 LAB — GLUCOSE, CAPILLARY
GLUCOSE-CAPILLARY: 109 mg/dL — AB (ref 65–99)
GLUCOSE-CAPILLARY: 155 mg/dL — AB (ref 65–99)
GLUCOSE-CAPILLARY: 163 mg/dL — AB (ref 65–99)
GLUCOSE-CAPILLARY: 98 mg/dL (ref 65–99)
Glucose-Capillary: 114 mg/dL — ABNORMAL HIGH (ref 65–99)
Glucose-Capillary: 102 mg/dL — ABNORMAL HIGH (ref 65–99)

## 2015-05-23 LAB — MAGNESIUM
MAGNESIUM: 2 mg/dL (ref 1.7–2.4)
Magnesium: 2.1 mg/dL (ref 1.7–2.4)

## 2015-05-23 LAB — BASIC METABOLIC PANEL
Anion gap: 11 (ref 5–15)
BUN: 18 mg/dL (ref 6–20)
CALCIUM: 8.6 mg/dL — AB (ref 8.9–10.3)
CO2: 35 mmol/L — AB (ref 22–32)
CREATININE: 0.52 mg/dL (ref 0.44–1.00)
Chloride: 92 mmol/L — ABNORMAL LOW (ref 101–111)
GFR calc non Af Amer: >60 mL/min (ref >60)
GLUCOSE: 149 mg/dL — AB (ref 65–99)
Potassium: 4.4 mmol/L (ref 3.5–5.1)
Sodium: 138 mmol/L (ref 135–145)

## 2015-05-23 LAB — CBC WITH DIFFERENTIAL/PLATELET
BASOS PCT: 0 %
Basophils Absolute: 0 10*3/uL (ref 0.0–0.1)
EOS ABS: 0.3 10*3/uL (ref 0.0–0.7)
Eosinophils Relative: 1 %
HCT: 35.1 % — ABNORMAL LOW (ref 36.0–46.0)
Hemoglobin: 11 g/dL — ABNORMAL LOW (ref 12.0–15.0)
Lymphocytes Relative: 4 %
Lymphs Abs: 0.9 10*3/uL (ref 0.7–4.0)
MCH: 29.4 pg (ref 26.0–34.0)
MCHC: 31.3 g/dL (ref 30.0–36.0)
MCV: 93.9 fL (ref 78.0–100.0)
MONO ABS: 0.8 10*3/uL (ref 0.1–1.0)
MONOS PCT: 4 %
Neutro Abs: 19.6 10*3/uL — ABNORMAL HIGH (ref 1.7–7.7)
Neutrophils Relative %: 91 %
Platelets: 134 10*3/uL — ABNORMAL LOW (ref 150–400)
RBC: 3.74 MIL/uL — ABNORMAL LOW (ref 3.87–5.11)
RDW: 14 % (ref 11.5–15.5)
WBC: 21.6 10*3/uL — ABNORMAL HIGH (ref 4.0–10.5)

## 2015-05-23 LAB — PHOSPHORUS
PHOSPHORUS: 3.3 mg/dL (ref 2.5–4.6)
Phosphorus: 3.7 mg/dL (ref 2.5–4.6)

## 2015-05-23 LAB — AMMONIA: AMMONIA: 16 umol/L (ref 9–35)

## 2015-05-23 MED ORDER — VITAL HIGH PROTEIN PO LIQD
1000.0000 mL | ORAL | Status: DC
  Administered 2015-05-23: 1000 mL

## 2015-05-23 MED ORDER — VITAL AF 1.2 CAL PO LIQD
1000.0000 mL | ORAL | Status: DC
  Administered 2015-05-23 – 2015-05-25 (×4): 1000 mL
  Filled 2015-05-23 (×2): qty 1000

## 2015-05-23 MED ORDER — FUROSEMIDE 10 MG/ML IJ SOLN
40.0000 mg | Freq: Two times a day (BID) | INTRAMUSCULAR | Status: DC
  Administered 2015-05-23 – 2015-05-25 (×5): 40 mg via INTRAVENOUS
  Filled 2015-05-23 (×6): qty 4

## 2015-05-23 MED ORDER — RISPERIDONE 1 MG/ML PO SOLN
1.0000 mg | Freq: Once | ORAL | Status: AC
  Administered 2015-05-23: 1 mg
  Filled 2015-05-23: qty 1

## 2015-05-23 MED ORDER — RISPERIDONE 1 MG/ML PO SOLN
2.0000 mg | Freq: Two times a day (BID) | ORAL | Status: DC
  Administered 2015-05-23 – 2015-05-26 (×6): 2 mg via ORAL
  Filled 2015-05-23 (×7): qty 2

## 2015-05-23 MED ORDER — PRO-STAT SUGAR FREE PO LIQD
30.0000 mL | Freq: Two times a day (BID) | ORAL | Status: DC
  Administered 2015-05-23 – 2015-05-26 (×7): 30 mL
  Filled 2015-05-23 (×10): qty 30

## 2015-05-23 NOTE — Progress Notes (Signed)
Nutrition Follow-up  DOCUMENTATION CODES:   Non-severe (moderate) malnutrition in context of acute illness/injury  INTERVENTION:   Initiate TF via OGT with Vital AF 1.2 at 25 ml/h and Prostat 30 ml BID on day 1; on day 2, increase to goal rate of 45 ml/h (1080 ml per day) to provide 1296 kcals, 81 gm protein, 876 ml free water daily.  NUTRITION DIAGNOSIS:   Inadequate oral intake related to inability to eat as evidenced by NPO status.  Ongoing  GOAL:   Patient will meet greater than or equal to 90% of their needs  Progressing  MONITOR:   Vent status, Labs, Weight trends, TF tolerance, I & O's  REASON FOR ASSESSMENT:   Consult Enteral/tube feeding initiation and management  ASSESSMENT:   65 y.o. female with PMH of HLD, anxiety, depression, and COPD (never had formal PFT's, was only told she had COPD per CXR report). She began to feel sick around christmas time 2016 with cough productive of thick green sputum and fevers (Tmax 102) / chills. She saw her PCP who placed her on a z- pack which she completed 2 days ago. She apparently was initially feeling somewhat better, then began to have worsening SOB 1 day ago.  Pt self-extubated on 05/22/15, however, re-intubated 05/22/15, due to agitation and suspicion of inability to maintain respiratory status.  Patient is currently intubated on ventilator support. OGT in place. MV: 12.5 L/min Temp (24hrs), Avg:99.5 F (37.5 C), Min:97.7 F (36.5 C), Max:100 F (37.8 C)  Propofol: n/a  Pt remains agitated. Per MD, plan to check ammonia level and EEG, as agitation is the biggest barrier to extubation.   RD received consult to reinstate TF. Noted Vital High Protein is currently running @ 25 ml/hr, with 30 ml Prostat BID. This regimen provides 800 kcals and 83 grams protein (which provides 63% of kcal needs and 100% of kcal needs and 100% of kcal needs).  Prior to extubation/re-intubation, pt was receiving Oxepa at 35 ml/h (840 ml  per day) with PS 30 ml BID to provide 1460 kcals, 83 gm protein, 659 ml free water daily (>100% of estimated needs and 100% of estimated protein needs).  Labs reviewed: CBGS: 114-163.   Diet Order:  Diet NPO time specified  Skin:  Reviewed, no issues  Last BM:  05/22/15  Height:   Ht Readings from Last 1 Encounters:  05/21/15 5' (1.524 m)    Weight:   Wt Readings from Last 1 Encounters:  05/23/15 114 lb (51.71 kg)    Ideal Body Weight:  45.5 kg  BMI:  Body mass index is 22.26 kg/(m^2).  Estimated Nutritional Needs:   Kcal:  1267.3  Protein:  80-95 grams  Fluid:  >1.2 L  EDUCATION NEEDS:   No education needs identified at this time  Hence Derrick A. Mayford Knife, RD, LDN, CDE Pager: [phone] After hours Pager: [phone]

## 2015-05-23 NOTE — Progress Notes (Signed)
[facility] PCCM  Given her baseline COPD and ARDS we started NAVA to attempt better vent synchrony and hopefully decrease the amount of sedation we needed to maintain her on the vent.  She is currently tolerating it well with NAVA level 0.8 per mV EDI.  Will plan to continue for now, but RT knows that if she shows signs of respiratory distress to go back to pressure control ventilation which has been her preferred full support method this week.  Heber [place], MD [place] P[facility]M Pager: [phone] Cell: [phone] After 3pm or if no response, call [phone]

## 2015-05-23 NOTE — Progress Notes (Addendum)
PULMONARY / CRITICAL CARE MEDICINE   Name: Julia Massey MRN: [id number] DOB: 01-12-1951    ADMISSION DATE:  05/14/2015 CONSULTATION DATE:  05/13/14  REFERRING MD:  [facility]   SUBJECTIVE:  Intubated and sedated   Lungs sound clear   VITAL SIGNS: BP 94/59 mmHg  Pulse 78  Temp(Src) 99.3 F (37.4 C) (Core (Comment))  Resp 14  Ht 5' (1.524 m)  Wt 51.71 kg (114 lb)  BMI 22.26 kg/m2  SpO2 100%  LMP  (LMP Unknown)  HEMODYNAMICS: CVP:  [3 mmHg] 3 mmHg  VENTILATOR SETTINGS: Vent Mode:  [-] PCV FiO2 (%):  [40 %-55 %] 40 % Set Rate:  [14 bmp] 14 bmp PEEP:  [5 cmH20] 5 cmH20 Pressure Support:  [5 cmH20] 5 cmH20 Plateau Pressure:  [18 cmH20-24 cmH20] 22 cmH20  INTAKE / OUTPUT: I/O last 3 completed shifts: In: 2724.5 [I.V.:2069.5; NG/GT:455; IV Piggyback:200] Out: 6650 [Urine:6650]   PHYSICAL EXAMINATION: General: On vent Neuro: sedated HEENT: NCAT, ETT in place PULM: clear bilaterally, vent supported breaths CV: RRR, no mgr GI: soft, + bowel sounds MSK: no edema Derm: no cyanosis, no rash or skin breakdown  LABS:  BMET  Recent Labs Lab 05/21/15 0433 05/22/15 0437 05/23/15 0310  NA 135 133* 138  K 4.2 4.1 4.4  CL 93* 89* 92*  CO2 35* 37* 35*  BUN 18 18 18   CREATININE 0.41* 0.43* 0.52  GLUCOSE 208* 254* 149*    Electrolytes  Recent Labs Lab 05/18/15 0445 05/19/15 0424  05/21/15 0433 05/22/15 0437 05/23/15 0310  CALCIUM 8.4* 8.5*  < > 8.0* 8.2* 8.6*  MG 2.1 2.1  --   --   --   --   PHOS 2.1* 3.0  --   --   --   --   < > = values in this interval not displayed.  CBC  Recent Labs Lab 05/21/15 0433 05/22/15 0437 05/23/15 0310  WBC 12.3* 12.4* 21.6*  HGB 9.8* 10.2* 11.0*  HCT 32.3* 32.8* 35.1*  PLT 240 156 134*    Coag's No results for input(s): APTT, INR in the last 168 hours.  Sepsis Markers  Recent Labs Lab 05/18/15 0445  PROCALCITON <0.10    ABG  Recent Labs Lab 05/20/15 0352 05/22/15 0415  05/22/15 1712  PHART 7.469* 7.495* 7.443  PCO2ART 51.9* 51.7* 56.7*  PO2ART 189* 130.0* 126.0*    Liver Enzymes  Recent Labs Lab 05/17/15 0518  AST 14*  ALT 18  ALKPHOS 96  BILITOT 0.1*  ALBUMIN 2.0*    Cardiac Enzymes No results for input(s): TROPONINI, PROBNP in the last 168 hours.  Glucose  Recent Labs Lab 05/22/15 0824 05/22/15 1152 05/22/15 1713 05/22/15 1953 05/22/15 2344 05/23/15 0357  GLUCAP 169* 130* 178* 111* 98 155*    Imaging 1/12 CXR > stable bilateral infiltrates, ETT in place   STUDIES:  CTA 01/03 > no PE.  Extensive bilateral patchy pulmonary infiltrates c/w atypical PNA.  Additional peripheral pulmonary fibrosis at both lung bases.  Mediastinal and hilar adenopathy, likely reactive. CTA 01/05 >>  1. No evidence pulmonary embolism.  2. Cardiomegaly with right atrial enlargement, mild left ventricular  hypertrophy and right ventricular hypertrophy. Mild left main and  LAD coronary atherosclerosis.  3. Extensive ground-glass opacity throughout both lungs with  scattered confluent airspace opacities peripherally in both lungs.  ARDS suspected. The peripheral airspace opacities raise the  possibility eosinophilic pneumonia.  4. Likely reactive mediastinal and right hilar lymphadenopathy. CT neck 01/05 >  Significant periodontal disease. No features suggestive of mandibular osteomyelitis. Assessment of pharyngeal masses and mucosal surfaces is limited but no retropharyngeal or parapharyngeal abscess is observed. Cervical lymph node prominence is incompletely evaluated. No suppurative lymph node mass is evident  TTE 01/05:  - Left ventricle: The cavity size was normal. Wall thickness was normal. Systolic function was normal. The estimated ejection fraction was in the range of 55% to 60%. Wall motion was normal; there were no regional wall motion abnormalities. Doppler parameters are consistent with abnormal left ventricular relaxation  (grade 1 diastolic dysfunction). - Ventricular septum: The contour showed diastolic flattening and systolic flattening. - Right ventricle: The cavity size was moderately dilated. Systolic function was severely reduced. - Right atrium: The atrium was mildly dilated. - Pulmonary arteries: Systolic pressure was moderately increased. PA peak pressure: 52 mm Hg (S). - Pericardium, extracardiac: A trivial pericardial effusion was identified.   CULTURES: Flu dec 28th>>>neg Blood 01/04 > NGTD final x2 Urine 01/04 > neg Sputum 01/04 > nl flora U. Strep 01/04 >neg U. Legionella 01/04 >neg BAL 01/05> culture pending NGTD, gram stain moderate wbc (mostly pmn) no organisms  Cell diff>>>93% neutro, no eos Rvp>>>neg 1/11 blood > NGTD 1/11 Resp > rare yeast  ANTIBIOTICS: Vanc 01/04 >>>1/7 Zosyn 01/04 >>>1/7 Azithro 01/04 >>>1/5 Levofloxacin 1/5>>> 1/12  SIGNIFICANT EVENTS: 01/04 > admitted with acute hypoxic respiratory failure and concern for atypical PNA. 1/5- distress , on bipap --> intubated, bronchoscopy, a-line and central line placed  ASSESSMENT / PLAN:  PULMONARY A: ARDS> better oxygenation but CXR with stable infiltrates; oxygenating well and passing SBT 1/12 AM Acute pulmonary edema? Former smoker P:   Intubated; wean as tolerated  Continue fentanyl and versed for sedation  Continue bronchodilators Continue solumedrol at current dose CXR this morning showing LLL pneumonia   INFECTIOUS A:   Concern for atypical PNA vs post viral pneumonitis (pct low) P:   Finished Levaquin yesterday.  Blood cx drawn 1/11 showing no growth in <24 hrs. However, WBC count increased from 12.4--> 21.6 today. Consider re-starting antibiotics.   CARDIOVASCULAR A:  Elevated BNP (1252) - ? Pulmonary edema secondary to IVF received in ICU Mild troponin leak Hx HLD pulm htn pa 52 > due to hypoxemia P:  Continue crestor Map goal 60 Systolic BP in 150s-160s today. Consider  discontinuing Phenylephrine. Consider decreasing dose of Lasix. Patient has diuresed well.   RENAL A:   No acute issues P:   Monitor BMET and UOP Replace electrolytes as needed  GASTROINTESTINAL A:   Nutrition constipation P:   tube feeds ppi Senna Miralax Colace Add dulcolax  HEMATOLOGIC A:   VTE Prophylaxis Leukocytosis ->resolved P:  SCD's / Heparin Monitor for bleeding  ENDOCRINE A:   ? Relative adrenal insufficiency P:   Monitor glucose on BMP Steroids, weaning as able  NEUROLOGIC A:   Hx anxiety / depression Acute encephalopathy> main issue keeping her on vent for the last several days? Alcohol withdrawal; follow commands 1/12 AM P:   Continue outpatient fluoxetine Fentanyl prn Versed prn, decrease frequency Continue risperdol Continue precedex for now Continue PRN fentanyl Prn haldol

## 2015-05-23 NOTE — Procedures (Signed)
ELECTROENCEPHALOGRAM REPORT  Date of Study: 05/23/2015  Patient's Name: Julia Massey MRN: [id number] Date of Birth: 11/02/1950  Referring Provider: Dr. Levy Pupa  Clinical History: This is a 65 year old woman with hypoxia with continued altered mental status.   Medications: fentaNYL (SUBLIMAZE) 2,500 mcg in sodium chloride 0.9 % 250 mL (10 mcg/mL) infusion FLUoxetine (PROZAC) capsule 40 mg folic acid (FOLVITE) tablet 1 mg furosemide (LASIX) injection 40 mg haloperidol lactate (HALDOL) injection 1-4 mg HYDROcodone-acetaminophen (NORCO) 7.5-325 MG per tablet 1 tablet methylPREDNISolone sodium succinate (SOLU-MEDROL) 40 mg/mL injection 40 mg multivitamin with minerals tablet 1 tablet pantoprazole sodium (PROTONIX) 40 mg/20 mL oral suspension 40 mg phenylephrine (NEO-SYNEPHRINE) 40 mg in dextrose 5 % 250 mL (0.16 mg/mL) infusion risperiDONE (RISPERDAL) 1 MG/ML oral solution 2 mg rosuvastatin (CRESTOR) tablet 10 mg thiamine (B-1) injection 100 mg  Technical Summary: A multichannel digital EEG recording measured by the international 10-20 system with electrodes applied with paste and impedances below 5000 ohms performed as portable with EKG monitoring in an intubated and sedated patient.  Hyperventilation and photic stimulation were not performed.  The digital EEG was referentially recorded, reformatted, and digitally filtered in a variety of bipolar and referential montages for optimal display.   Description: The patient is intubated and sedated on Fentanyl during the recording. She is noted to follow some commands. During maximal wakefulness, there is a symmetric, medium voltage 7  Hz posterior dominant rhythm that poorly attenuates to eye opening and eye closure. This is admixed with a moderate amount of diffuse 4-6 Hz theta and 2-3 Hz delta slowing of the background, with additional occasional focal delta slowing over the left temporal region. At times, there is sharply contoured  activity seen over the left temporal region without clear epileptogenic potential. There is a single sharp generalized transient of unclear clinical significance seen. Normal sleep architecture is not seen. Hyperventilation and photic stimulation were not performed.  There were no electrographic seizures seen.    EKG lead showed sinus tachycardia.  Impression: This sedated EEG is abnormal due to the presence of: 1. Moderate diffuse slowing of the background 2. Additional focal slowing over the left temporal region 3. Single sharp generalized transient of unclear clinical significance  Clinical Correlation of the above findings indicates bilateral cerebral dysfunction that is non-specific in etiology and can be seen with hypoxic/ischemic injury, toxic/metabolic encephalopathies, or medication effect. Additional focal slowing over the left temporal region indicates focal cerebral dysfunction in this region suggestive of underlying structural or physiologic abnormality. There is a single generalized sharp transient seen with unclear clinical significance. No electrographic seizures noted. Clinical correlation is advised.   Patrcia Dolly, M.D.

## 2015-05-23 NOTE — Progress Notes (Signed)
Patient is currently on NAVA mode and is tolerating it well. RT will continue to monitor.

## 2015-05-23 NOTE — Plan of Care (Signed)
Problem: Phase I Progression Outcomes Goal: GIProphysixis Outcome: Completed/Met Date Met:  05/23/15 Protonix

## 2015-05-23 NOTE — Progress Notes (Signed)
Notified Resident MD Darreld Mclean in regards to WBC 21.6 on AM labs 05/23/15, spiked from 12.4 on 05/22/15. Will continue to monitor and assess.

## 2015-05-23 NOTE — Progress Notes (Signed)
Patient placed on NAVA mode of ventilation per MD.  Patient is currently tolerating well.  If patient shows signs of distress, will place patient back on full support mode, on pressure control.  Will continue to monitor.

## 2015-05-23 NOTE — Plan of Care (Signed)
Problem: Phase I Progression Outcomes Goal: VTE prophylaxis Outcome: Completed/Met Date Met:  05/23/15 Heparin SBQ and Scds for mechanical

## 2015-05-24 ENCOUNTER — Inpatient Hospital Stay (HOSPITAL_COMMUNITY): Payer: BLUE CROSS/BLUE SHIELD

## 2015-05-24 LAB — BASIC METABOLIC PANEL
Anion gap: 10 (ref 5–15)
BUN: 30 mg/dL — AB (ref 6–20)
CHLORIDE: 90 mmol/L — AB (ref 101–111)
CO2: 36 mmol/L — AB (ref 22–32)
CREATININE: 0.52 mg/dL (ref 0.44–1.00)
Calcium: 9.2 mg/dL (ref 8.9–10.3)
GFR calc Af Amer: >60 mL/min (ref >60)
GFR calc non Af Amer: >60 mL/min (ref >60)
GLUCOSE: 187 mg/dL — AB (ref 65–99)
POTASSIUM: 4.2 mmol/L (ref 3.5–5.1)
SODIUM: 136 mmol/L (ref 135–145)

## 2015-05-24 LAB — GLUCOSE, CAPILLARY
GLUCOSE-CAPILLARY: 120 mg/dL — AB (ref 65–99)
GLUCOSE-CAPILLARY: 130 mg/dL — AB (ref 65–99)
GLUCOSE-CAPILLARY: 139 mg/dL — AB (ref 65–99)
GLUCOSE-CAPILLARY: 156 mg/dL — AB (ref 65–99)
GLUCOSE-CAPILLARY: 184 mg/dL — AB (ref 65–99)
Glucose-Capillary: 147 mg/dL — ABNORMAL HIGH (ref 65–99)

## 2015-05-24 LAB — CBC
HCT: 38.5 % (ref 36.0–46.0)
HEMOGLOBIN: 12.3 g/dL (ref 12.0–15.0)
MCH: 29.9 pg (ref 26.0–34.0)
MCHC: 31.9 g/dL (ref 30.0–36.0)
MCV: 93.7 fL (ref 78.0–100.0)
Platelets: 96 10*3/uL — ABNORMAL LOW (ref 150–400)
RBC: 4.11 MIL/uL (ref 3.87–5.11)
RDW: 13.9 % (ref 11.5–15.5)
WBC: 17 10*3/uL — ABNORMAL HIGH (ref 4.0–10.5)

## 2015-05-24 LAB — MAGNESIUM
MAGNESIUM: 2 mg/dL (ref 1.7–2.4)
Magnesium: 2.1 mg/dL (ref 1.7–2.4)

## 2015-05-24 LAB — BLOOD GAS, ARTERIAL
ACID-BASE EXCESS: 11.8 mmol/L — AB (ref 0.0–2.0)
Bicarbonate: 36 mEq/L — ABNORMAL HIGH (ref 20.0–24.0)
DRAWN BY: 418751
FIO2: 0.4
O2 SAT: 98.8 %
PATIENT TEMPERATURE: 98.6
PCO2 ART: 47.8 mmHg — AB (ref 35.0–45.0)
PEEP: 5 cmH2O
PH ART: 7.489 — AB (ref 7.350–7.450)
PRESSURE CONTROL: 14 cmH2O
RATE: 14 resp/min
TCO2: 37.4 mmol/L (ref 0–100)
pO2, Arterial: 123 mmHg — ABNORMAL HIGH (ref 80.0–100.0)

## 2015-05-24 LAB — CULTURE, RESPIRATORY W GRAM STAIN: Special Requests: NORMAL

## 2015-05-24 MED ORDER — INSULIN ASPART 100 UNIT/ML ~~LOC~~ SOLN
2.0000 [IU] | SUBCUTANEOUS | Status: DC
  Administered 2015-05-24: 2 [IU] via SUBCUTANEOUS
  Administered 2015-05-24 (×2): 4 [IU] via SUBCUTANEOUS
  Administered 2015-05-24 (×2): 2 [IU] via SUBCUTANEOUS
  Administered 2015-05-25: 4 [IU] via SUBCUTANEOUS
  Administered 2015-05-25: 2 [IU] via SUBCUTANEOUS
  Administered 2015-05-25: 6 [IU] via SUBCUTANEOUS
  Administered 2015-05-25: 2 [IU] via SUBCUTANEOUS
  Administered 2015-05-25: 6 [IU] via SUBCUTANEOUS
  Administered 2015-05-25 – 2015-05-26 (×2): 2 [IU] via SUBCUTANEOUS
  Administered 2015-05-26 (×2): 4 [IU] via SUBCUTANEOUS
  Administered 2015-05-26: 2 [IU] via SUBCUTANEOUS
  Administered 2015-05-26: 4 [IU] via SUBCUTANEOUS
  Administered 2015-05-27 (×2): 2 [IU] via SUBCUTANEOUS

## 2015-05-24 NOTE — Progress Notes (Signed)
PULMONARY / CRITICAL CARE MEDICINE   Name: Julia Massey MRN: [id number] DOB: 1950/12/02    ADMISSION DATE:  05/14/2015 CONSULTATION DATE:  05/13/14  REFERRING MD:  [facility]   SUBJECTIVE:  Intubated and sedated   Lungs sound clear   VITAL SIGNS: BP 115/67 mmHg  Pulse 92  Temp(Src) 99.2 F (37.3 C) (Oral)  Resp 12  Ht 5' (1.524 m)  Wt 50.3 kg (110 lb 14.3 oz)  BMI 21.66 kg/m2  SpO2 100%  LMP  (LMP Unknown)  HEMODYNAMICS:    VENTILATOR SETTINGS: Vent Mode:  [-] Other (Comment) FiO2 (%):  [40 %] 40 % Set Rate:  [14 bmp] 14 bmp PEEP:  [5 cmH20] 5 cmH20 Plateau Pressure:  [19 cmH20] 19 cmH20  INTAKE / OUTPUT: I/O last 3 completed shifts: In: 2180.2 [I.V.:1549.8; NG/GT:630.4] Out: 4325 [Urine:4325]   PHYSICAL EXAMINATION: General: On vent Neuro: sedated HEENT: NCAT, ETT in place PULM: clear bilaterally, vent supported breaths CV: RRR, no mgr GI: soft, + bowel sounds MSK: no edema Derm: no cyanosis, no rash or skin breakdown  LABS:  BMET  Recent Labs Lab 05/22/15 0437 05/23/15 0310 05/24/15 0400  NA 133* 138 136  K 4.1 4.4 4.2  CL 89* 92* 90*  CO2 37* 35* 36*  BUN 18 18 30*  CREATININE 0.43* 0.52 0.52  GLUCOSE 254* 149* 187*    Electrolytes  Recent Labs Lab 05/19/15 0424  05/22/15 0437 05/23/15 0310 05/23/15 1020 05/23/15 1907 05/24/15 0400 05/24/15 0830  CALCIUM 8.5*  < > 8.2* 8.6*  --   --  9.2  --   MG 2.1  --   --   --  2.1 2.0  --  2.0  PHOS 3.0  --   --   --  3.7 3.3  --   --   < > = values in this interval not displayed.  CBC  Recent Labs Lab 05/22/15 0437 05/23/15 0310 05/24/15 0400  WBC 12.4* 21.6* 17.0*  HGB 10.2* 11.0* 12.3  HCT 32.8* 35.1* 38.5  PLT 156 134* 96*    Coag's No results for input(s): APTT, INR in the last 168 hours.  Sepsis Markers  Recent Labs Lab 05/18/15 0445  PROCALCITON <0.10    ABG  Recent Labs Lab 05/22/15 0415 05/22/15 1712 05/24/15 0431  PHART 7.495*  7.443 7.489*  PCO2ART 51.7* 56.7* 47.8*  PO2ART 130.0* 126.0* 123*    Liver Enzymes No results for input(s): AST, ALT, ALKPHOS, BILITOT, ALBUMIN in the last 168 hours.  Cardiac Enzymes No results for input(s): TROPONINI, PROBNP in the last 168 hours.  Glucose  Recent Labs Lab 05/23/15 1558 05/23/15 1914 05/23/15 2346 05/24/15 0405 05/24/15 0835 05/24/15 1208  GLUCAP 114* 102* 120* 184* 156* 139*    Imaging 1/12 CXR > stable bilateral infiltrates, ETT in place   STUDIES:  CTA 01/03 > no PE.  Extensive bilateral patchy pulmonary infiltrates c/w atypical PNA.  Additional peripheral pulmonary fibrosis at both lung bases.  Mediastinal and hilar adenopathy, likely reactive. CTA 01/05 >>  1. No evidence pulmonary embolism.  2. Cardiomegaly with right atrial enlargement, mild left ventricular  hypertrophy and right ventricular hypertrophy. Mild left main and  LAD coronary atherosclerosis.  3. Extensive ground-glass opacity throughout both lungs with  scattered confluent airspace opacities peripherally in both lungs.  ARDS suspected. The peripheral airspace opacities raise the  possibility eosinophilic pneumonia.  4. Likely reactive mediastinal and right hilar lymphadenopathy. CT neck 01/05 > Significant periodontal disease. No  features suggestive of mandibular osteomyelitis. Assessment of pharyngeal masses and mucosal surfaces is limited but no retropharyngeal or parapharyngeal abscess is observed. Cervical lymph node prominence is incompletely evaluated. No suppurative lymph node mass is evident  TTE 01/05:  - Left ventricle: The cavity size was normal. Wall thickness was normal. Systolic function was normal. The estimated ejection fraction was in the range of 55% to 60%. Wall motion was normal; there were no regional wall motion abnormalities. Doppler parameters are consistent with abnormal left ventricular relaxation (grade 1 diastolic dysfunction). -  Ventricular septum: The contour showed diastolic flattening and systolic flattening. - Right ventricle: The cavity size was moderately dilated. Systolic function was severely reduced. - Right atrium: The atrium was mildly dilated. - Pulmonary arteries: Systolic pressure was moderately increased. PA peak pressure: 52 mm Hg (S). - Pericardium, extracardiac: A trivial pericardial effusion was identified.   CULTURES: Flu dec 28th>>>neg Blood 01/04 > NGTD final x2 Urine 01/04 > neg Sputum 01/04 > nl flora U. Strep 01/04 >neg U. Legionella 01/04 >neg BAL 01/05> culture pending NGTD, gram stain moderate wbc (mostly pmn) no organisms  Cell diff>>>93% neutro, no eos Rvp>>>neg 1/11 blood > NGTD 1/11 Resp > rare yeast  ANTIBIOTICS: Vanc 01/04 >>>1/7 Zosyn 01/04 >>>1/7 Azithro 01/04 >>>1/5 Levofloxacin 1/5>>> 1/12  SIGNIFICANT EVENTS: 01/04 > admitted with acute hypoxic respiratory failure and concern for atypical PNA. 1/5- distress , on bipap --> intubated, bronchoscopy, a-line and central line placed  ASSESSMENT / PLAN:  PULMONARY A: ARDS> better oxygenation but CXR with stable infiltrates Acute pulmonary edema? Former smoker P:   Continue current vent support with NAVA detection mode Continue fentanyl and versed for sedation  Continue bronchodilators Continue solumedrol at current dose Abx as below  INFECTIOUS A:   Concern for atypical PNA vs post viral pneumonitis (pct low) P:   Finished Levaquin 1/12 Blood cx drawn 1/11 showing no growth in <24 hrs.  CARDIOVASCULAR A:  Elevated BNP (1252) - ? Pulmonary edema secondary to IVF received in ICU Mild troponin leak Hx HLD pulm htn pa 52 > due to hypoxemia P:  Continue crestor Map goal 60 Continue diuresis   RENAL A:   No acute issues P:   Lasix 40mg  bid Monitor BMET and UOP Replace electrolytes as needed  GASTROINTESTINAL A:   Nutrition constipation P:   tube  feeds ppi Senna Miralax Colace Add dulcolax  HEMATOLOGIC A:   VTE Prophylaxis Leukocytosis ->resolved P:  SCD's / Heparin Monitor for bleeding  ENDOCRINE A:   ? Relative adrenal insufficiency P:   Monitor glucose on BMP Steroids, weaning as able  NEUROLOGIC A:   Hx anxiety / depression Acute encephalopathy> main issue keeping her on vent for the last several days? Alcohol withdrawal; follow commands 1/12 AM P:   Continue outpatient fluoxetine Fentanyl gtt Continue risperdol Versed gtt Prn haldol   Independent CC time 35 minutes  3/12, MD, PhD 05/24/2015, 3:05 PM [facility] [phone] or if no answer [phone]

## 2015-05-25 ENCOUNTER — Inpatient Hospital Stay (HOSPITAL_COMMUNITY): Payer: BLUE CROSS/BLUE SHIELD

## 2015-05-25 LAB — BASIC METABOLIC PANEL
ANION GAP: 11 (ref 5–15)
BUN: 51 mg/dL — AB (ref 6–20)
CHLORIDE: 89 mmol/L — AB (ref 101–111)
CO2: 39 mmol/L — ABNORMAL HIGH (ref 22–32)
Calcium: 9.5 mg/dL (ref 8.9–10.3)
Creatinine, Ser: 0.57 mg/dL (ref 0.44–1.00)
GFR calc Af Amer: >60 mL/min (ref >60)
GLUCOSE: 237 mg/dL — AB (ref 65–99)
POTASSIUM: 3.9 mmol/L (ref 3.5–5.1)
Sodium: 139 mmol/L (ref 135–145)

## 2015-05-25 LAB — GLUCOSE, CAPILLARY
GLUCOSE-CAPILLARY: 201 mg/dL — AB (ref 65–99)
GLUCOSE-CAPILLARY: 139 mg/dL — AB (ref 65–99)
GLUCOSE-CAPILLARY: 129 mg/dL — AB (ref 65–99)
Glucose-Capillary: 143 mg/dL — ABNORMAL HIGH (ref 65–99)
Glucose-Capillary: 157 mg/dL — ABNORMAL HIGH (ref 65–99)
Glucose-Capillary: 202 mg/dL — ABNORMAL HIGH (ref 65–99)

## 2015-05-25 LAB — CBC
HEMATOCRIT: 38.6 % (ref 36.0–46.0)
HEMOGLOBIN: 12.2 g/dL (ref 12.0–15.0)
MCH: 29.9 pg (ref 26.0–34.0)
MCHC: 31.6 g/dL (ref 30.0–36.0)
MCV: 94.6 fL (ref 78.0–100.0)
Platelets: 106 10*3/uL — ABNORMAL LOW (ref 150–400)
RBC: 4.08 MIL/uL (ref 3.87–5.11)
RDW: 13.7 % (ref 11.5–15.5)
WBC: 14.7 10*3/uL — AB (ref 4.0–10.5)

## 2015-05-25 LAB — PHOSPHORUS: PHOSPHORUS: 4.4 mg/dL (ref 2.5–4.6)

## 2015-05-25 LAB — MAGNESIUM: Magnesium: 2.3 mg/dL (ref 1.7–2.4)

## 2015-05-25 MED ORDER — FENTANYL CITRATE (PF) 100 MCG/2ML IJ SOLN: 100.0000 ug | INTRAMUSCULAR | Status: DC | PRN

## 2015-05-25 MED ORDER — FUROSEMIDE 10 MG/ML IJ SOLN
40.0000 mg | Freq: Every day | INTRAMUSCULAR | Status: DC
Start: 2015-05-26 — End: 2015-05-26
  Administered 2015-05-26: 40 mg via INTRAVENOUS
  Filled 2015-05-25: qty 4

## 2015-05-25 NOTE — Progress Notes (Signed)
200 ml fentanyl and 25 ml versed wasted in sink. Verified by Louie Bun, RN and Meredith Staggers, RN

## 2015-05-25 NOTE — Progress Notes (Signed)
PULMONARY / CRITICAL CARE MEDICINE   Name: Julia Massey MRN: [id number] DOB: 09-Sep-1950    SUBJECTIVE:  Intubated and sedated Lungs sound clear   VITAL SIGNS: Temp:  [98.6 F (37 C)-100.2 F (37.9 C)] 99.7 F (37.6 C) (01/15 0500) Pulse Rate:  [88-130] 100 (01/15 0500) Resp:  [11-24] 14 (01/15 0500) BP: (82-129)/(52-87) 103/64 mmHg (01/15 0500) SpO2:  [95 %-100 %] 99 % (01/15 0500) FiO2 (%):  [40 %] 40 % (01/15 0400) Weight:  [45.9 kg (101 lb 3.1 oz)] 45.9 kg (101 lb 3.1 oz) (01/15 0500) HEMODYNAMICS:   VENTILATOR SETTINGS: Vent Mode:  [-] PCV FiO2 (%):  [40 %] 40 % Set Rate:  [14 bmp] 14 bmp Vt Set:  [370 mL] 370 mL PEEP:  [5 cmH20] 5 cmH20 Pressure Support:  [12 cmH20-14 cmH20] 12 cmH20 Plateau Pressure:  [19 cmH20-20 cmH20] 19 cmH20 INTAKE / OUTPUT: Intake/Output      01/14 0701 - 01/15 0700   I.V. (mL/kg) 424.1 (9.2)   NG/GT 1280   Total Intake(mL/kg) 1704.1 (37.1)   Urine (mL/kg/hr) 2155 (2)   Total Output 2155   Net -450.9         PHYSICAL EXAMINATION: General: On vent Neuro: sedated HEENT: NCAT, ETT in place PULM: anterior lungs fields clear bilaterally, vent supported breaths CV: RRR, no mgr GI: soft, + bowel sounds MSK: no edema. SCDs in place on b/l lower extremities.  Derm: no cyanosis, no rash or skin breakdown  LABS:  CBC  Recent Labs Lab 05/23/15 0310 05/24/15 0400 05/25/15 0514  WBC 21.6* 17.0* 14.7*  HGB 11.0* 12.3 12.2  HCT 35.1* 38.5 38.6  PLT 134* 96* 106*   Coag's No results for input(s): APTT, INR in the last 168 hours. BMET  Recent Labs Lab 05/22/15 0437 05/23/15 0310 05/24/15 0400  NA 133* 138 136  K 4.1 4.4 4.2  CL 89* 92* 90*  CO2 37* 35* 36*  BUN 18 18 30*  CREATININE 0.43* 0.52 0.52  GLUCOSE 254* 149* 187*   Electrolytes  Recent Labs Lab 05/19/15 0424  05/22/15 0437 05/23/15 0310 05/23/15 1020 05/23/15 1907 05/24/15 0400 05/24/15 0830 05/24/15 1827  CALCIUM 8.5*  < > 8.2* 8.6*  --   --  9.2   --   --   MG 2.1  --   --   --  2.1 2.0  --  2.0 2.1  PHOS 3.0  --   --   --  3.7 3.3  --   --   --   < > = values in this interval not displayed. Sepsis Markers No results for input(s): LATICACIDVEN, PROCALCITON, O2SATVEN in the last 168 hours. ABG  Recent Labs Lab 05/22/15 1712 05/24/15 0431 05/25/15 0345  PHART 7.443 7.489* 7.449  PCO2ART 56.7* 47.8* 59.6*  PO2ART 126.0* 123* 140*   Liver Enzymes No results for input(s): AST, ALT, ALKPHOS, BILITOT, ALBUMIN in the last 168 hours. Cardiac Enzymes No results for input(s): TROPONINI, PROBNP in the last 168 hours. Glucose  Recent Labs Lab 05/24/15 0835 05/24/15 1208 05/24/15 1545 05/24/15 1956 05/24/15 2354 05/25/15 0407  GLUCAP 156* 139* 130* 147* 157* 202*    Imaging Ct Head Wo Contrast  05/24/2015  CLINICAL DATA:  Acute encephalopathy. EXAM: CT HEAD WITHOUT CONTRAST TECHNIQUE: Contiguous axial images were obtained from the base of the skull through the vertex without intravenous contrast. COMPARISON:  CT neck May 15, 2015 FINDINGS: Mild motion degraded examination. The ventricles and sulci are normal. No intraparenchymal  hemorrhage, mass effect nor midline shift. No acute large vascular territory infarcts. No abnormal extra-axial fluid collections. Basal cisterns are patent. Minimal calcific atherosclerosis of the carotid siphons. No skull fracture. The included ocular globes and orbital contents are non-suspicious. Bilateral suspected mastoid effusions with life-support lines in place though, limited assessment due to motion. IMPRESSION: Negative motion degraded CT head. Electronically Signed   By: Awilda Metro M.D.   On: 05/24/2015 02:39   Dg Chest Port 1 View  05/24/2015  CLINICAL DATA:  Acute respiratory failure with hypoxia. On ventilator. EXAM: PORTABLE CHEST 1 VIEW COMPARISON:  05/23/2015 FINDINGS: Support lines and tubes in appropriate position. No evidence of pneumothorax. Bibasilar predominant pulmonary  airspace disease shows no significant change. Heart size is stable IMPRESSION: Bibasilar predominant pulmonary airspace disease, without significant change. Electronically Signed   By: Myles Rosenthal M.D.   On: 05/24/2015 10:55   1/14 CXR > stable bilateral infiltrates, ETT in place  STUDIES:  CTA 01/03 > no PE. Extensive bilateral patchy pulmonary infiltrates c/w atypical PNA. Additional peripheral pulmonary fibrosis at both lung bases. Mediastinal and hilar adenopathy, likely reactive. CTA 01/05 >> 1. No evidence pulmonary embolism. 2. Cardiomegaly with right atrial enlargement, mild left ventricular hypertrophy and right ventricular hypertrophy. Mild left main and LAD coronary atherosclerosis. 3. Extensive ground-glass opacity throughout both lungs with scattered confluent airspace opacities peripherally in both lungs. ARDS suspected. The peripheral airspace opacities raise the possibility eosinophilic pneumonia. 4. Likely reactive mediastinal and right hilar lymphadenopathy. CT neck 01/05 > Significant periodontal disease. No features suggestive of mandibular osteomyelitis. Assessment of pharyngeal masses and mucosal surfaces is limited but no retropharyngeal or parapharyngeal abscess is observed. Cervical lymph node prominence is incompletely evaluated. No suppurative lymph node mass is evident  TTE 01/05:  - Left ventricle: The cavity size was normal. Wall thickness was normal. Systolic function was normal. The estimated ejection fraction was in the range of 55% to 60%. Wall motion was normal; there were no regional wall motion abnormalities. Doppler parameters are consistent with abnormal left ventricular relaxation (grade 1 diastolic dysfunction). - Ventricular septum: The contour showed diastolic flattening and systolic flattening. - Right ventricle: The  cavity size was moderately dilated. Systolic function was severely reduced. - Right atrium: The atrium was mildly dilated. - Pulmonary arteries: Systolic pressure was moderately increased. PA peak pressure: 52 mm Hg (S). - Pericardium, extracardiac: A trivial pericardial effusion was identified.   CULTURES: Flu dec 28th>>>neg Blood 01/04 > NGTD final x2 Urine 01/04 > neg Sputum 01/04 > nl flora U. Strep 01/04 >neg U. Legionella 01/04 >neg BAL 01/05> culture pending NGTD, gram stain moderate wbc (mostly pmn) no organisms Cell diff>>>93% neutro, no eos Rvp>>>neg 1/11 blood > NGTD 1/11 Resp > rare yeast  ANTIBIOTICS: Vanc 01/04 >>>1/7 Zosyn 01/04 >>>1/7 Azithro 01/04 >>>1/5 Levofloxacin 1/5>>> 1/12  SIGNIFICANT EVENTS: 01/04 > admitted with acute hypoxic respiratory failure and concern for atypical PNA. 1/5- distress , on bipap --> intubated, bronchoscopy, a-line and central line placed  ASSESSMENT / PLAN:  PULMONARY A: ARDS> better oxygenation but CXR with stable infiltrates Acute pulmonary edema? Former smoker P:  Continue current vent support with NAVA detection mode Continue fentanyl and versed for sedation  Continue bronchodilators Continue solumedrol at current dose Abx as below  INFECTIOUS A:  Concern for atypical PNA vs post viral pneumonitis (pct low) P:  Finished Levaquin 1/12 Blood cx drawn 1/11 showing no growth in 3 days.  CARDIOVASCULAR A:  Elevated BNP (1252) - ?  Pulmonary edema secondary to IVF received in ICU Mild troponin leak Hx HLD pulm htn pa 52 > due to hypoxemia P:  Continue crestor Map goal 60 Continue diuresis   RENAL A:  No acute issues P:  Lasix 40mg  bid Monitor BMET and UOP Replace electrolytes as needed  GASTROINTESTINAL A:  Nutrition constipation P:  tube feeds ppi Senna Miralax Colace Dulcolax  HEMATOLOGIC A:  VTE Prophylaxis Leukocytosis ->resolved P:  SCD's /  Heparin Monitor for bleeding  ENDOCRINE A:  ? Relative adrenal insufficiency P:  Monitor glucose on BMP Steroids, weaning as able  NEUROLOGIC A:  Hx anxiety / depression Acute encephalopathy> main issue keeping her on vent for the last several days? Alcohol withdrawal; follow commands 1/12 AM P:  Continue outpatient fluoxetine Fentanyl gtt Continue risperdol Versed gtt Prn haldol  TODAY'S SUMMARY:   I have personally obtained a history, examined the patient, evaluated laboratory and imaging results, formulated the assessment and plan and placed orders. CRITICAL CARE: The patient is critically ill with multiple organ systems failure and requires high complexity decision making for assessment and support, frequent evaluation and titration of therapies, application of advanced monitoring technologies and extensive interpretation of multiple databases. Critical Care Time devoted to patient care services described in this note is  minutes.    Attending Note:  I have examined patient, reviewed labs, studies and notes. I have discussed the case with Dr 3/12, and I agree with the data and plans as amended above. Patient admitted with VDRF and CAP vs viral PNA. She has had encephalopathy, metabolic + due to sedation. She has been diuresing. On my eval today she is sedated, did not respond to stim. She is tolerating PSV using NAVA sensing. Abx are completed. She is now net negative I/O. She is on steroids, will attempt to wean quickly. Attempt to lighten sedation next 24h. We will decrease lasix given her evolving metabolic alkalosis.  Independent critical care time is 35 minutes.   Loney Loh, MD, PhD 05/25/2015, 11:40 AM [facility] [phone] or if no answer [phone]

## 2015-05-26 ENCOUNTER — Inpatient Hospital Stay (HOSPITAL_COMMUNITY): Payer: BLUE CROSS/BLUE SHIELD

## 2015-05-26 LAB — CULTURE, BLOOD (ROUTINE X 2)
CULTURE: NO GROWTH
CULTURE: NO GROWTH

## 2015-05-26 LAB — CBC
HCT: 38.8 % (ref 36.0–46.0)
Hemoglobin: 11.7 g/dL — ABNORMAL LOW (ref 12.0–15.0)
MCH: 28.9 pg (ref 26.0–34.0)
MCHC: 30.2 g/dL (ref 30.0–36.0)
MCV: 95.8 fL (ref 78.0–100.0)
PLATELETS: 128 10*3/uL — AB (ref 150–400)
RBC: 4.05 MIL/uL (ref 3.87–5.11)
RDW: 13.8 % (ref 11.5–15.5)
WBC: 14.9 10*3/uL — ABNORMAL HIGH (ref 4.0–10.5)

## 2015-05-26 LAB — BLOOD GAS, ARTERIAL
ACID-BASE EXCESS: 15.1 mmol/L — AB (ref 0.0–2.0)
Bicarbonate: 39.3 mEq/L — ABNORMAL HIGH (ref 20.0–24.0)
DRAWN BY: 437071
FIO2: 0.4
O2 Saturation: 98.8 %
PCO2 ART: 48.4 mmHg — AB (ref 35.0–45.0)
PEEP: 5 cmH2O
PH ART: 7.521 — AB (ref 7.350–7.450)
Patient temperature: 98.6
Pressure control: 14 cmH2O
RATE: 14 resp/min
TCO2: 40.8 mmol/L (ref 0–100)
pO2, Arterial: 122 mmHg — ABNORMAL HIGH (ref 80.0–100.0)

## 2015-05-26 LAB — BASIC METABOLIC PANEL
Anion gap: 9 (ref 5–15)
BUN: 54 mg/dL — ABNORMAL HIGH (ref 6–20)
CO2: 40 mmol/L — ABNORMAL HIGH (ref 22–32)
CREATININE: 0.5 mg/dL (ref 0.44–1.00)
Calcium: 9.5 mg/dL (ref 8.9–10.3)
Chloride: 96 mmol/L — ABNORMAL LOW (ref 101–111)
GFR calc Af Amer: >60 mL/min (ref >60)
GLUCOSE: 159 mg/dL — AB (ref 65–99)
POTASSIUM: 3.1 mmol/L — AB (ref 3.5–5.1)
SODIUM: 145 mmol/L (ref 135–145)

## 2015-05-26 LAB — GLUCOSE, CAPILLARY
GLUCOSE-CAPILLARY: 120 mg/dL — AB (ref 65–99)
GLUCOSE-CAPILLARY: 143 mg/dL — AB (ref 65–99)
GLUCOSE-CAPILLARY: 160 mg/dL — AB (ref 65–99)
Glucose-Capillary: 160 mg/dL — ABNORMAL HIGH (ref 65–99)
Glucose-Capillary: 136 mg/dL — ABNORMAL HIGH (ref 65–99)
Glucose-Capillary: 160 mg/dL — ABNORMAL HIGH (ref 65–99)

## 2015-05-26 LAB — MAGNESIUM: MAGNESIUM: 2.4 mg/dL (ref 1.7–2.4)

## 2015-05-26 LAB — PHOSPHORUS: PHOSPHORUS: 3.9 mg/dL (ref 2.5–4.6)

## 2015-05-26 MED ORDER — POTASSIUM CHLORIDE 10 MEQ/100ML IV SOLN
10.0000 meq | INTRAVENOUS | Status: AC
  Administered 2015-05-26 (×4): 10 meq via INTRAVENOUS
  Filled 2015-05-26 (×5): qty 100

## 2015-05-26 MED ORDER — ACETAZOLAMIDE SODIUM 500 MG IJ SOLR
250.0000 mg | INTRAMUSCULAR | Status: DC
  Administered 2015-05-26: 250 mg via INTRAVENOUS
  Filled 2015-05-26 (×2): qty 500

## 2015-05-26 MED ORDER — FUROSEMIDE 10 MG/ML IJ SOLN
20.0000 mg | Freq: Every day | INTRAMUSCULAR | Status: DC
  Administered 2015-05-27: 20 mg via INTRAVENOUS
  Filled 2015-05-26: qty 2

## 2015-05-26 MED ORDER — CETYLPYRIDINIUM CHLORIDE 0.05 % MT LIQD
7.0000 mL | Freq: Two times a day (BID) | OROMUCOSAL | Status: DC
  Administered 2015-05-26 – 2015-05-30 (×7): 7 mL via OROMUCOSAL

## 2015-05-26 MED ORDER — RISPERIDONE 2 MG PO TBDP
2.0000 mg | ORAL_TABLET | Freq: Two times a day (BID) | ORAL | Status: DC
  Administered 2015-05-26 – 2015-05-27 (×2): 2 mg via ORAL
  Filled 2015-05-26 (×3): qty 1

## 2015-05-26 NOTE — Progress Notes (Signed)
eLink Pharmacist-Brief Progress Note Patient Name: Julia Massey DOB: 03/02/1951 MRN: [id number]   Date of Service  05/26/2015  HPI/Events of Note  Pt extubated today 01/16. Medications reviewed.  eICU Interventions  D/c SUP   eLink Provider: Luci Bank P 05/26/2015, 4:09 PM

## 2015-05-26 NOTE — Procedures (Signed)
Extubation Procedure Note  Patient Details:   Name: MARGUERETTE SHELLER DOB: 03-Nov-1950 MRN: [id number]   Airway Documentation:     Evaluation  O2 sats: stable throughout Complications: No apparent complications Patient did tolerate procedure well. Bilateral Breath Sounds: Clear, Diminished Suctioning: Airway Yes   Pt extubated to 4L [place].  No stridor noted.  RN at bedside.  Christophe Louis 05/26/2015, 2:35 PM

## 2015-05-26 NOTE — Progress Notes (Signed)
PULMONARY / CRITICAL CARE MEDICINE   Name: Julia Massey MRN: [id number] DOB: [birth year]    SUBJECTIVE:  Intubated Opens eyes and follows commands  Lungs sound clear  As per nursing staff, patient did not receive any sedation (fentanyl prn or versed prn) last night and remained calm, no agitation.  Lasix was decreased 1/15  VITAL SIGNS: Temp:  [99 F (37.2 C)-100.4 F (38 C)] 99 F (37.2 C) (01/16 0000) Pulse Rate:  [99-124] 101 (01/16 0500) Resp:  [15-27] 18 (01/16 0500) BP: (91-136)/(57-89) 105/69 mmHg (01/16 0500) SpO2:  [96 %-99 %] 97 % (01/16 0500) FiO2 (%):  [40 %] 40 % (01/16 0500) Weight:  [49.2 kg (108 lb 7.5 oz)] 49.2 kg (108 lb 7.5 oz) (01/16 0500) HEMODYNAMICS:   VENTILATOR SETTINGS: Vent Mode:  [-] PCV FiO2 (%):  [40 %] 40 % Set Rate:  [14 bmp] 14 bmp PEEP:  [5 cmH20] 5 cmH20 Pressure Support:  [5 cmH20-10 cmH20] 5 cmH20 Plateau Pressure:  [19 cmH20-20 cmH20] 20 cmH20 INTAKE / OUTPUT: Intake/Output      01/15 0701 - 01/16 0700   I.V. (mL/kg) 286 (5.8)   NG/GT 1065   Total Intake(mL/kg) 1351 (27.5)   Urine (mL/kg/hr) 1550 (1.3)   Stool 0 (0)   Total Output 1550   Net -199       Stool Occurrence 1 x     PHYSICAL EXAMINATION: General: On vent Neuro: sedated HEENT: NCAT, ETT in place PULM: anterior lungs fields clear bilaterally, vent supported breaths CV: RRR, no mgr GI: soft, + bowel sounds MSK: no edema. SCDs in place on b/l lower extremities.  Derm: no cyanosis, no rash or skin breakdown  LABS:  CBC  Recent Labs Lab 05/24/15 0400 05/25/15 0514 05/26/15 0500  WBC 17.0* 14.7* 14.9*  HGB 12.3 12.2 11.7*  HCT 38.5 38.6 38.8  PLT 96* 106* 128*   Coag's No results for input(s): APTT, INR in the last 168 hours. BMET  Recent Labs Lab 05/24/15 0400 05/25/15 0514 05/26/15 0500  NA 136 139 145  K 4.2 3.9 3.1*  CL 90* 89* 96*  CO2 36* 39* 40*  BUN 30* 51* 54*  CREATININE 0.52 0.57 0.50  GLUCOSE 187* 237* 159*    Electrolytes  Recent Labs Lab 05/23/15 1907 05/24/15 0400  05/24/15 1827 05/25/15 0514 05/25/15 0725 05/26/15 0500  CALCIUM  --  9.2  --   --  9.5  --  9.5  MG 2.0  --   < > 2.1  --  2.3 2.4  PHOS 3.3  --   --   --   --  4.4 3.9  < > = values in this interval not displayed. Sepsis Markers No results for input(s): LATICACIDVEN, PROCALCITON, O2SATVEN in the last 168 hours. ABG  Recent Labs Lab 05/24/15 0431 05/25/15 0345 05/26/15 0350  PHART 7.489* 7.449 7.521*  PCO2ART 47.8* 59.6* 48.4*  PO2ART 123* 140* 122*   Liver Enzymes No results for input(s): AST, ALT, ALKPHOS, BILITOT, ALBUMIN in the last 168 hours. Cardiac Enzymes No results for input(s): TROPONINI, PROBNP in the last 168 hours. Glucose  Recent Labs Lab 05/25/15 0407 05/25/15 0802 05/25/15 1153 05/25/15 1557 05/25/15 2009 05/26/15 0028  GLUCAP 202* 201* 143* 139* 129* 160*    Imaging Dg Chest Port 1 View  05/25/2015  CLINICAL DATA:  Acute respiratory failure with hypoxia. Adult respiratory distress syndrome. COPD. On ventilator. EXAM: PORTABLE CHEST 1 VIEW COMPARISON:  05/24/2015 FINDINGS: Support lines and tubes in appropriate  position. No pneumothorax visualized. Bilateral lower lung airspace disease shows no significant change. No evidence of pleural effusion. Heart size is stable. IMPRESSION: Bilateral lower lobe airspace disease, without significant change. Electronically Signed   By: Earle Gell M.D.   On: 05/25/2015 13:07   1/14 CXR > stable bilateral infiltrates, ETT in place  STUDIES:  CTA 01/03 > no PE. Extensive bilateral patchy pulmonary infiltrates c/w atypical PNA. Additional peripheral pulmonary fibrosis at both lung bases. Mediastinal and hilar adenopathy, likely reactive. CTA 01/05 >> 1. No evidence pulmonary embolism. 2. Cardiomegaly with right atrial enlargement, mild left ventricular hypertrophy and right ventricular hypertrophy. Mild left  main and LAD coronary atherosclerosis. 3. Extensive ground-glass opacity throughout both lungs with scattered confluent airspace opacities peripherally in both lungs. ARDS suspected. The peripheral airspace opacities raise the possibility eosinophilic pneumonia. 4. Likely reactive mediastinal and right hilar lymphadenopathy. CT neck 01/05 > Significant periodontal disease. No features suggestive of mandibular osteomyelitis. Assessment of pharyngeal masses and mucosal surfaces is limited but no retropharyngeal or parapharyngeal abscess is observed. Cervical lymph node prominence is incompletely evaluated. No suppurative lymph node mass is evident  TTE 01/05:  - Left ventricle: The cavity size was normal. Wall thickness was normal. Systolic function was normal. The estimated ejection fraction was in the range of 55% to 60%. Wall motion was normal; there were no regional wall motion abnormalities. Doppler parameters are consistent with abnormal left ventricular relaxation (grade 1 diastolic dysfunction). - Ventricular septum: The contour showed diastolic flattening and systolic flattening. - Right ventricle: The cavity size was moderately dilated. Systolic function was severely reduced. - Right atrium: The atrium was mildly dilated. - Pulmonary arteries: Systolic pressure was moderately increased. PA peak pressure: 52 mm Hg (S). - Pericardium, extracardiac: A trivial pericardial effusion was identified.   CULTURES: Flu dec 28th>>>neg Blood 01/04 > NGTD final x2 Urine 01/04 > neg Sputum 01/04 > nl flora U. Strep 01/04 >neg U. Legionella 01/04 >neg BAL 01/05> culture pending NGTD, gram stain moderate wbc (mostly pmn) no organisms Cell diff>>>93% neutro, no eos Rvp>>>neg 1/11 blood > NGTD 1/11 Resp > rare yeast  ANTIBIOTICS: Vanc 01/04 >>>1/7 Zosyn 01/04 >>>1/7 Azithro 01/04  >>>1/5 Levofloxacin 1/5>>> 1/12  SIGNIFICANT EVENTS: 01/04 > admitted with acute hypoxic respiratory failure and concern for atypical PNA. 1/5- distress , on bipap --> intubated, bronchoscopy, a-line and central line placed  ASSESSMENT / PLAN:  PULMONARY A: ARDS> better oxygenation but CXR with stable infiltrates Acute pulmonary edema? Former smoker P:  Continue current vent support with NAVA detection mode Wean off sedation (Fentanyl prn, Versed prn). Possible extubation today.   Continue bronchodilators Solumedrol stopped 1/15  INFECTIOUS A:  Concern for atypical PNA vs post viral pneumonitis (pct low) P:  Finished Levaquin 1/12 Blood cx drawn 1/11 showing no growth in 4 days.  CARDIOVASCULAR A:  Elevated BNP (1252) - ? Pulmonary edema secondary to IVF received in ICU Mild troponin leak Hx HLD pulm htn pa 52 > due to hypoxemia P:  Continue crestor Map goal 60 Continue diuresis as her renal fxn will tolerate > decrease to lasix 20 on 1/16  RENAL A:  Hypernatremia Metabolic alk from diuresis P:  Lasix 74m qd starting 1/16 Monitor BMET and UOP Replace electrolytes as needed Start diamox on 1/16, x 3 days   GASTROINTESTINAL A:  Nutrition constipation P:  tube feeds for now until patient is extubated  ppi Senna Miralax Colace Dulcolax  HEMATOLOGIC A:  VTE Prophylaxis Leukocytosis ->resolved P:  SCD's / Heparin Monitor for bleeding  ENDOCRINE A:  ? Relative adrenal insufficiency P:  Monitor glucose on BMP Steroid discontinued on 1/15  NEUROLOGIC A:  Hx anxiety / depression Acute encephalopathy> patient remained calm without any sedation overnight. She is opening her eyes and following commands this morning.  P:  Continue outpatient fluoxetine Continue risperdol Prn haldol Wean off sedation (fentanyl prn, versed prn). Possible extubation today.    Shela Leff, MD PGY 1 ([facility]) Pager [phone]   TODAY'S  SUMMARY:   I have personally obtained a history, examined the patient, evaluated laboratory and imaging results, formulated the assessment and plan and placed orders. CRITICAL CARE: The patient is critically ill with multiple organ systems failure and requires high complexity decision making for assessment and support, frequent evaluation and titration of therapies, application of advanced monitoring technologies and extensive interpretation of multiple databases. Critical Care Time devoted to patient care services described in this note is 35  minutes.    Attending Note:  I have examined patient, reviewed labs, studies and notes. I have discussed the case with Dr Marlowe Sax, and I agree with the data and plans as I have amended above. VDRF in setting pneumonitis vs PNA, COPD, overall debilitation. Improved with time, diuresis, abx. On my eval she is awake, tolerating NAVA spontaneous breathing. She has a strong cough. I would like to push for extubation today. Start diamox x 3 days given her metabolic alkalosis with our diuresis. She will need aggressive PT, possibly SNF / rehab. Independent critical care time is 35 minutes.   Baltazar Apo, MD, PhD 05/26/2015, 11:51 AM [facility] [phone] or if no answer [phone]

## 2015-05-26 NOTE — Progress Notes (Signed)
Utilization review completed.  

## 2015-05-27 ENCOUNTER — Inpatient Hospital Stay (HOSPITAL_COMMUNITY): Payer: BLUE CROSS/BLUE SHIELD

## 2015-05-27 DIAGNOSIS — R5381 Other malaise: Secondary | ICD-10-CM

## 2015-05-27 LAB — CBC
HCT: 42.6 % (ref 36.0–46.0)
Hemoglobin: 13 g/dL (ref 12.0–15.0)
MCH: 29.5 pg (ref 26.0–34.0)
MCHC: 30.5 g/dL (ref 30.0–36.0)
MCV: 96.6 fL (ref 78.0–100.0)
PLATELETS: 170 10*3/uL (ref 150–400)
RBC: 4.41 MIL/uL (ref 3.87–5.11)
RDW: 14.2 % (ref 11.5–15.5)
WBC: 17.6 10*3/uL — ABNORMAL HIGH (ref 4.0–10.5)

## 2015-05-27 LAB — BLOOD GAS, ARTERIAL
Acid-Base Excess: 7 mmol/L — ABNORMAL HIGH (ref 0.0–2.0)
Bicarbonate: 30.6 mEq/L — ABNORMAL HIGH (ref 20.0–24.0)
Drawn by: 448981
O2 CONTENT: 0.5 L/min
O2 Saturation: 95.1 %
PCO2 ART: 40.5 mmHg (ref 35.0–45.0)
PH ART: 7.491 — AB (ref 7.350–7.450)
Patient temperature: 98.6
TCO2: 31.9 mmol/L (ref 0–100)
pO2, Arterial: 72.4 mmHg — ABNORMAL LOW (ref 80.0–100.0)

## 2015-05-27 LAB — BASIC METABOLIC PANEL
Anion gap: 16 — ABNORMAL HIGH (ref 5–15)
BUN: 62 mg/dL — AB (ref 6–20)
CALCIUM: 10.3 mg/dL (ref 8.9–10.3)
CO2: 33 mmol/L — ABNORMAL HIGH (ref 22–32)
Chloride: 95 mmol/L — ABNORMAL LOW (ref 101–111)
Creatinine, Ser: 0.5 mg/dL (ref 0.44–1.00)
GFR calc Af Amer: >60 mL/min (ref >60)
GLUCOSE: 119 mg/dL — AB (ref 65–99)
POTASSIUM: 3.2 mmol/L — AB (ref 3.5–5.1)
Sodium: 144 mmol/L (ref 135–145)

## 2015-05-27 LAB — GLUCOSE, CAPILLARY
GLUCOSE-CAPILLARY: 115 mg/dL — AB (ref 65–99)
GLUCOSE-CAPILLARY: 113 mg/dL — AB (ref 65–99)
GLUCOSE-CAPILLARY: 139 mg/dL — AB (ref 65–99)
GLUCOSE-CAPILLARY: 113 mg/dL — AB (ref 65–99)
GLUCOSE-CAPILLARY: 110 mg/dL — AB (ref 65–99)
Glucose-Capillary: 140 mg/dL — ABNORMAL HIGH (ref 65–99)

## 2015-05-27 LAB — MAGNESIUM: Magnesium: 2.8 mg/dL — ABNORMAL HIGH (ref 1.7–2.4)

## 2015-05-27 LAB — PHOSPHORUS: Phosphorus: 5 mg/dL — ABNORMAL HIGH (ref 2.5–4.6)

## 2015-05-27 MED ORDER — POTASSIUM CHLORIDE 10 MEQ/100ML IV SOLN
10.0000 meq | INTRAVENOUS | Status: AC
  Administered 2015-05-27 (×4): 10 meq via INTRAVENOUS
  Filled 2015-05-27 (×4): qty 100

## 2015-05-27 MED ORDER — KCL IN DEXTROSE-NACL 40-5-0.45 MEQ/L-%-% IV SOLN
INTRAVENOUS | Status: DC
  Administered 2015-05-28: 20:00:00 via INTRAVENOUS
  Administered 2015-05-28: 1000 mL via INTRAVENOUS
  Administered 2015-05-29 – 2015-05-30 (×2): via INTRAVENOUS
  Filled 2015-05-27 (×8): qty 1000

## 2015-05-27 NOTE — Progress Notes (Signed)
Inpatient Rehabilitation  Patient was screened by Keir Viernes for appropriateness for an Inpatient Acute Rehab consult.  At this time, we are recommending Inpatient Rehab consult.  Please order when you feel appropriate.   Rosalva Neary PT Inpatient Rehab Admissions Coordinator Cell [phone] Office [phone]   

## 2015-05-27 NOTE — Evaluation (Signed)
Physical Therapy Evaluation Patient Details Name: Julia Massey MRN: [id number] DOB: April 11, 1951 Today's Date: 05/27/2015   History of Present Illness  Patient is a 65 y/o female with hx of HLD, anxiety, depression, and COPD presents with hypoxic to the point that she required BiPAP (was 73% on RA).CTA -revealed extensive bilateral patchy infiltrates c/w atypical PNA.  Intubated 1/5-1/12, re-intubated 1/12-1/16,   Clinical Impression  Patient presents with functional limitations due to deficits listed in PT problem list (see below). Pt with long complex hospital course with prolonged intubation presenting with generalized weakness, impaired cognition, balance deficits and endurance impacting safe mobility. Tolerated SPT with Max-total A. Pt has spouse and daughter at home. Need to determine accurate PLOF/history as pt confused today. Highly motivated. Would benefit from CIR to maximize independence and mobility prior to return home with support from family.     Follow Up Recommendations CIR    Equipment Recommendations  Other (comment) (TBA)    Recommendations for Other Services OT consult;Rehab consult     Precautions / Restrictions Precautions Precautions: Fall Restrictions Weight Bearing Restrictions: No      Mobility  Bed Mobility Overal bed mobility: Needs Assistance Bed Mobility: Supine to Sit     Supine to sit: Mod assist;HOB elevated     General bed mobility comments: Assist with BLEs, to elevate trunk and scoot bottom to EOB. Increased time. + dizziness.  BP 95/74.  Transfers Overall transfer level: Needs assistance Equipment used: 2 person hand held assist Transfers: Sit to/from [employer] Sit to Stand: +2 physical assistance;Max assist Stand pivot transfers: Total assist;+2 physical assistance       General transfer comment: Stood from EOB x1 with cues for hand placement/technique. Pt with posterior lean through hips. Total A of 2 SPT to  chair with posterior lean- able to shuffle feet.   Ambulation/Gait                Stairs            Wheelchair Mobility    Modified Rankin (Stroke Patients Only)       Balance Overall balance assessment: Needs assistance Sitting-balance support: Feet supported;No upper extremity supported Sitting balance-Leahy Scale: Fair Sitting balance - Comments: Able to sit unsupported.    Standing balance support: During functional activity Standing balance-Leahy Scale: Zero Standing balance comment: Pt requires assist of 2 to stand with posterior lean.                              Pertinent Vitals/Pain Pain Assessment: No/denies pain Faces Pain Scale: No hurt    Home Living Family/patient expects to be discharged to:: Private residence Living Arrangements: Spouse/significant other;Children Available Help at Discharge: Family Type of Home: House Home Access: Stairs to enter Entrance Stairs-Rails: Right Entrance Stairs-Number of Steps: 1 through garage, 15 front door Home Layout: Two level;Bed/bath upstairs Home Equipment: None      Prior Function Level of Independence: Independent         Comments: Pt confused at times and difficulty getting accurate PLOF/history.     Hand Dominance        Extremity/Trunk Assessment   Upper Extremity Assessment: Defer to OT evaluation;Generalized weakness           Lower Extremity Assessment: Generalized weakness      Cervical / Trunk Assessment:  (Pt with weak neck extensors)  Communication   Communication: No difficulties  Cognition Arousal/Alertness: Awake/alert  Overall Cognitive Status: Impaired/Different from baseline Area of Impairment: Safety/judgement;Problem solving;Orientation Orientation Level: Disoriented to;Place ("[facility]")   Memory: Decreased short-term memory   Safety/Judgement: Decreased awareness of safety   Problem Solving: Slow processing General Comments:  Pt tangential in speech. Starting to answer questions unrelated to question asked.     General Comments General comments (skin integrity, edema, etc.): Sitting EOB BP 102/73, 91% on .5 L/min 02   Post transfer to chair BP 110/74, HR 105, Sp02 92%.    Exercises        Assessment/Plan    PT Assessment Patient needs continued PT services  PT Diagnosis Generalized weakness;Difficulty walking   PT Problem List Decreased strength;Cardiopulmonary status limiting activity;Decreased activity tolerance;Decreased cognition;Decreased knowledge of use of DME;Decreased balance;Decreased mobility;Decreased safety awareness  PT Treatment Interventions Balance training;Gait training;Functional mobility training;Therapeutic exercise;Therapeutic activities;Patient/family education;Stair training   PT Goals (Current goals can be found in the Care Plan section) Acute Rehab PT Goals Patient Stated Goal: get stronger PT Goal Formulation: With patient Time For Goal Achievement: 06/10/15 Potential to Achieve Goals: Good    Frequency Min 3X/week   Barriers to discharge Decreased caregiver support;Inaccessible home environment not sure of level of support pt has at home, as pt confused and not able to provide accurate info. Steps to climb.    Co-evaluation               End of Session Equipment Utilized During Treatment: Gait belt;Oxygen Activity Tolerance: Patient tolerated treatment well Patient left: in chair;with call bell/phone within reach;with chair alarm set Nurse Communication: Mobility status         Time: 7588-3254 PT Time Calculation (min) (ACUTE ONLY): 23 min   Charges:   PT Evaluation $PT Eval Moderate Complexity: 1 Procedure PT Treatments $Therapeutic Activity: 8-22 mins   PT G Codes:        Rachele Lamaster A Shakora Nordquist 05/27/2015, 1:03 PM Mylo Red, PT, DPT [phone]

## 2015-05-27 NOTE — Care Management Note (Signed)
Case Management Note  Patient Details  Name: Julia Massey MRN: [id number] Date of Birth: 1950/06/07  Subjective/Objective:      Patient now extubated on 1-16.  Post ARDS - now on [place].  Deconditioned, needing rehab.  As most needs are rehab I feel would not be a candidate for [facility] and insurance also would be a barrier for this.  I do feel would do well in CIR, which is what is being recommended by PT.                Action/Plan:   Expected Discharge Date:                  Expected Discharge Plan:  IP Rehab Facility  In-House Referral:     Discharge planning Services  CM Consult  Post Acute Care Choice:    Choice offered to:     DME Arranged:    DME Agency:     HH Arranged:    HH Agency:     Status of Service:  In process, will continue to follow  Medicare Important Message Given:    Date Medicare IM Given:    Medicare IM give by:    Date Additional Medicare IM Given:    Additional Medicare Important Message give by:     If discussed at Long Length of Stay Meetings, dates discussed:    Additional Comments:  Vangie Bicker, RN 05/27/2015, 1:34 PM

## 2015-05-27 NOTE — Progress Notes (Signed)
PULMONARY / CRITICAL CARE MEDICINE   Name: Julia Massey MRN: [id number] DOB: December 16, 1950    SUBJECTIVE:  Patient was extubated yesterday. Currently on 2L O2 via [place] and denies having any SOB. No other complaints.   VITAL SIGNS: Temp:  [97.6 F (36.4 C)-99.1 F (37.3 C)] 97.8 F (36.6 C) (01/17 0342) Pulse Rate:  [92-117] 93 (01/17 0600) Resp:  [16-25] 19 (01/17 0600) BP: (93-139)/(59-78) 104/73 mmHg (01/17 0600) SpO2:  [91 %-98 %] 95 % (01/17 0600) FiO2 (%):  [40 %] 40 % (01/16 1200) Weight:  [46.2 kg (101 lb 13.6 oz)] 46.2 kg (101 lb 13.6 oz) (01/17 0431) HEMODYNAMICS:   VENTILATOR SETTINGS: Vent Mode:  [-] CPAP;PSV FiO2 (%):  [40 %] 40 % PEEP:  [5 cmH20] 5 cmH20 Pressure Support:  [5 cmH20] 5 cmH20 INTAKE / OUTPUT: Intake/Output      01/16 0701 - 01/17 0700   I.V. (mL/kg) 230 (5)   NG/GT 403.5   IV Piggyback 200   Total Intake(mL/kg) 833.5 (18)   Urine (mL/kg/hr) 1310 (1.2)   Total Output 1310   Net -476.5         PHYSICAL EXAMINATION: General: Sitting up in bed watching TV.  Neuro: AAO x3 HEENT: NCAT, EOMI PULM: Bibasilar rales. No wheezes or rhonchi.  CV: RRR. S1 and S2 appreciated. No m/g/r. GI: soft, + bowel sounds, NT/ND MSK: no edema. SCDs in place on b/l lower extremities.  Derm: no cyanosis, no rash or skin breakdown  LABS:  CBC  Recent Labs Lab 05/25/15 0514 05/26/15 0500 05/27/15 0312  WBC 14.7* 14.9* 17.6*  HGB 12.2 11.7* 13.0  HCT 38.6 38.8 42.6  PLT 106* 128* 170   Coag's No results for input(s): APTT, INR in the last 168 hours. BMET  Recent Labs Lab 05/25/15 0514 05/26/15 0500 05/27/15 0312  NA 139 145 144  K 3.9 3.1* 3.2*  CL 89* 96* 95*  CO2 39* 40* 33*  BUN 51* 54* 62*  CREATININE 0.57 0.50 0.50  GLUCOSE 237* 159* 119*   Electrolytes  Recent Labs Lab 05/25/15 0514 05/25/15 0725 05/26/15 0500 05/27/15 0312  CALCIUM 9.5  --  9.5 10.3  MG  --  2.3 2.4 2.8*  PHOS  --  4.4 3.9 5.0*   Sepsis Markers No results  for input(s): LATICACIDVEN, PROCALCITON, O2SATVEN in the last 168 hours. ABG  Recent Labs Lab 05/24/15 0431 05/25/15 0345 05/26/15 0350  PHART 7.489* 7.449 7.521*  PCO2ART 47.8* 59.6* 48.4*  PO2ART 123* 140* 122*   Liver Enzymes No results for input(s): AST, ALT, ALKPHOS, BILITOT, ALBUMIN in the last 168 hours. Cardiac Enzymes No results for input(s): TROPONINI, PROBNP in the last 168 hours. Glucose  Recent Labs Lab 05/26/15 0803 05/26/15 1126 05/26/15 1616 05/26/15 1950 05/27/15 0029 05/27/15 0303  GLUCAP 136* 160* 143* 120* 140* 115*    Imaging Dg Chest Port 1 View  05/26/2015  CLINICAL DATA:  Endotracheal tube EXAM: PORTABLE CHEST 1 VIEW COMPARISON:  Yesterday FINDINGS: Endotracheal tube tip in stable position between the clavicular heads and carina. Left IJ central line with tip at the SVC level. Two enteric tubes at least reaches the stomach. Normal heart size and stable mediastinal contours. Unchanged lung volumes with basilar and peripheral predominant coarse interstitial opacities. No edema, effusion, or pneumothorax. IMPRESSION: 1. Stable positioning of tubes and central line. 2. Stable lung inflation since yesterday. Airspace opacities have decreased since admission. 3. Chronic lung disease with traction bronchiectasis on recent chest CT. Chronicity of  residual interstitial opacities is uncertain. Electronically Signed   By: Monte Fantasia M.D.   On: 05/26/2015 07:38   Dg Chest Port 1 View  05/25/2015  CLINICAL DATA:  Acute respiratory failure with hypoxia. Adult respiratory distress syndrome. COPD. On ventilator. EXAM: PORTABLE CHEST 1 VIEW COMPARISON:  05/24/2015 FINDINGS: Support lines and tubes in appropriate position. No pneumothorax visualized. Bilateral lower lung airspace disease shows no significant change. No evidence of pleural effusion. Heart size is stable. IMPRESSION: Bilateral lower lobe airspace disease, without significant change. Electronically Signed    By: Earle Gell M.D.   On: 05/25/2015 13:07   1/14 CXR > stable bilateral infiltrates, ETT in place  STUDIES:  CTA 01/03 > no PE. Extensive bilateral patchy pulmonary infiltrates c/w atypical PNA. Additional peripheral pulmonary fibrosis at both lung bases. Mediastinal and hilar adenopathy, likely reactive. CTA 01/05 >> 1. No evidence pulmonary embolism. 2. Cardiomegaly with right atrial enlargement, mild left ventricular hypertrophy and right ventricular hypertrophy. Mild left main and LAD coronary atherosclerosis. 3. Extensive ground-glass opacity throughout both lungs with scattered confluent airspace opacities peripherally in both lungs. ARDS suspected. The peripheral airspace opacities raise the possibility eosinophilic pneumonia. 4. Likely reactive mediastinal and right hilar lymphadenopathy. CT neck 01/05 > Significant periodontal disease. No features suggestive of mandibular osteomyelitis. Assessment of pharyngeal masses and mucosal surfaces is limited but no retropharyngeal or parapharyngeal abscess is observed. Cervical lymph node prominence is incompletely evaluated. No suppurative lymph node mass is evident  TTE 01/05:  - Left ventricle: The cavity size was normal. Wall thickness was normal. Systolic function was normal. The estimated ejection fraction was in the range of 55% to 60%. Wall motion was normal; there were no regional wall motion abnormalities. Doppler parameters are consistent with abnormal left ventricular relaxation (grade 1 diastolic dysfunction). - Ventricular septum: The contour showed diastolic flattening and systolic flattening. - Right ventricle: The cavity size was moderately dilated. Systolic function was severely reduced. - Right atrium: The atrium was mildly dilated. - Pulmonary arteries: Systolic pressure was moderately  increased. PA peak pressure: 52 mm Hg (S). - Pericardium, extracardiac: A trivial pericardial effusion was identified.   CULTURES: Flu dec 28th>>>neg Blood 01/04 > NGTD final x2 Urine 01/04 > neg Sputum 01/04 > nl flora U. Strep 01/04 >neg U. Legionella 01/04 >neg BAL 01/05> culture pending NGTD, gram stain moderate wbc (mostly pmn) no organisms Cell diff>>>93% neutro, no eos Rvp>>>neg 1/11 blood > NGTD 1/11 Resp > rare yeast  ANTIBIOTICS: Vanc 01/04 >>>1/7 Zosyn 01/04 >>>1/7 Azithro 01/04 >>>1/5 Levofloxacin 1/5>>> 1/12  SIGNIFICANT EVENTS: 01/04 > admitted with acute hypoxic respiratory failure and concern for atypical PNA. 1/5- distress , on bipap --> intubated, bronchoscopy, a-line and central line placed  ASSESSMENT / PLAN:  PULMONARY A: ARDS> better oxygenation but CXR with stable infiltrates Acute pulmonary edema? Former smoker P:  Patient was extubated yesterday.    Continue bronchodilators Solumedrol stopped 1/15  INFECTIOUS A:  Concern for atypical PNA vs post viral pneumonitis (pct low) P:  Finished Levaquin 1/12 Blood cx drawn 1/11 showing no growth in 5 days.  CARDIOVASCULAR A:  Elevated BNP (1252) - ? Pulmonary edema secondary to IVF received in ICU Mild troponin leak Hx HLD pulm htn pa 52 > due to hypoxemia P:  Continue crestor Map goal 60 Lasix 20 mg daily   RENAL A:  Hypernatremia Metabolic alk from diuresis - improved since dose of Lasix has been decreased and Diamox started on 1/16 P:  Continue Lasix  Monitor BMET and UOP Replace electrolytes as needed Started diamox on 1/16, x 3 days   GASTROINTESTINAL A:  Nutrition constipation P:   NPO until SLP eval  ppi Senna Miralax Colace Dulcolax  HEMATOLOGIC A:  VTE Prophylaxis Leukocytosis ->  WBC increased from 14.9 yesterday to 17.6 today.  P:  SCD's / Heparin Monitor for bleeding  ENDOCRINE A:  ? Relative adrenal  insufficiency P:  Monitor glucose on BMP Steroid discontinued on 1/15  NEUROLOGIC A:  Hx anxiety / depression Acute encephalopathy (resolved) P:  Continue outpatient fluoxetine Continue risperdol Prn haldol    Shela Leff, MD PGY 1 ([facility]) Pager [phone]   TODAY'S SUMMARY:   I have personally obtained a history, examined the patient, evaluated laboratory and imaging results, formulated the assessment and plan and placed orders. CRITICAL CARE: The patient is critically ill with multiple organ systems failure and requires high complexity decision making for assessment and support, frequent evaluation and titration of therapies, application of advanced monitoring technologies and extensive interpretation of multiple databases. Critical Care Time devoted to patient care services described in this note is 35  minutes.

## 2015-05-27 NOTE — Progress Notes (Signed)
eLink Physician-Brief Progress Note Patient Name: Julia Massey DOB: [birth year] MRN: [id number]   Date of Service  05/27/2015  HPI/Events of Note    eICU Interventions  Orders modified to reflect her extubated and NPO status including DC of SSI, initiation of maintenance IVFs, etc     Intervention Category Major Interventions: Other:  Billy Fischer 05/27/2015, 8:55 PM

## 2015-05-27 NOTE — Evaluation (Signed)
Clinical/Bedside Swallow Evaluation Patient Details  Name: Julia Massey MRN: [id number] Date of Birth: [birth year]  Today's Date: 05/27/2015 Time: SLP Start Time (ACUTE ONLY): 1040 SLP Stop Time (ACUTE ONLY): 1100 SLP Time Calculation (min) (ACUTE ONLY): 20 min  Past Medical History: No past medical history on file. Past Surgical History: No past surgical history on file. HPI:  Pt admitted with acute hypoxic respiratory failure and concern for atypical pna. Intubated on 1/5 to 1/15. BSE done on 05/14/15 "Patient with a suspected primary esophageal dysphagia characterized by what appears to be a normal oropharyngeal swallow without overt indication of aspiration with tested pos but with chronic appearing throat clear (at baseline), c/o globus, mid sternal pressure during po intake, and occassional regurgitation of solid pos" Pt recommended to undergo a barium swallow, but pt went into respiratory distress before this was done.    Assessment / Plan / Recommendation Clinical Impression  Pt demonstrates continued report of difficulty consistent with esophageal dysphagia prior to admit, now also with acute reversible oropharyngeal dysphagia after prolonged intubation. Vocal quality is hoarse and pt demosntrates immediate coughing following sips of liquids. She also demosntrates multiple swallows with puree and occasional throat clear concerning for residuals, though prior MBS reports habitual throat clearing. Pt is not safe to initiate diet, but can begin ice chips and meds crushed in puree as needed. As oropharyngeal swallow improves, pt will need an esophagram (barium swallow). SLP will follow for readiness.     Aspiration Risk  Moderate aspiration risk    Diet Recommendation NPO except meds;Ice chips PRN after oral care        Other  Recommendations Recommended Consults: Consider esophageal assessment Oral Care Recommendations: Oral care QID   Follow up Recommendations  Inpatient Rehab     Frequency and Duration min 2x/week  2 weeks       Prognosis        Swallow Study   General HPI: Pt admitted with acute hypoxic respiratory failure and concern for atypical pna. Intubated on 1/5 to 1/15. BSE done on 05/14/15 "Patient with a suspected primary esophageal dysphagia characterized by what appears to be a normal oropharyngeal swallow without overt indication of aspiration with tested pos but with chronic appearing throat clear (at baseline), c/o globus, mid sternal pressure during po intake, and occassional regurgitation of solid pos" Pt recommended to undergo a barium swallow, but pt went into respiratory distress before this was done.  Type of Study: Bedside Swallow Evaluation Previous Swallow Assessment: BSE on admission, recommended barium swallow Diet Prior to this Study: NPO Temperature Spikes Noted: No History of Recent Intubation: Yes Length of Intubations (days): 11 days Date extubated: 05/25/15 Behavior/Cognition: Alert;Cooperative Oral Cavity Assessment: Dried secretions;Dry Oral Care Completed by SLP: Yes Oral Cavity - Dentition: Adequate natural dentition (missing back dentition) Self-Feeding Abilities: Needs assist Patient Positioning: Upright in bed Baseline Vocal Quality: Hoarse;Wet Volitional Cough: Strong Volitional Swallow: Able to elicit    Oral/Motor/Sensory Function Overall Oral Motor/Sensory Function: Within functional limits   Ice Chips Ice chips: Within functional limits Presentation: Self Fed   Thin Liquid Thin Liquid: Impaired Pharyngeal  Phase Impairments: Cough - Immediate;Throat Clearing - Immediate;Wet Vocal Quality    Nectar Thick Nectar Thick Liquid: Not tested   Honey Thick Honey Thick Liquid: Not tested   Puree Puree: Impaired Pharyngeal Phase Impairments: Multiple swallows   Solid   GO   Solid: Not tested       Harlon Ditty, MA CCC-SLP [phone]  Kol Consuegra, Kendal Hymen  Caroline 05/27/2015,11:04 AM

## 2015-05-27 NOTE — Progress Notes (Signed)
[facility] PHYSICAL MEDICINE AND REHABILITATION  CONSULT SERVICE NOTE  Pt down for a barium swallow when I came by. Will follow up with her first thing in the AM.   Thanks  Ranelle Oyster, MD, [facility] Physical Medicine & Rehabilitation 05/27/2015

## 2015-05-27 NOTE — Consult Note (Signed)
Physical Medicine and Rehabilitation Consult Reason for Consult: Debilitation with acute respiratory failure/pneumonia Referring Physician: Critical care   HPI: Julia Massey is a 65 y.o. right handed female with history of tobacco abuse ,COPD . Patient lives with spouse independent prior to admission. 2 level home with bedroom upstairs. Presented 05/15/2015 with productive cough low-grade fever and chills. Recently placed on a Z-Pak by her PCP. Found to be hypoxic 73% on room air. CT of the chest revealed extensive bilateral patchy infiltrates atypical pneumonia. No evidence of pulmonary emboli. White blood cell count 20,800, lactic acid 2.5. Potassium 3.2. Placed on broad-spectrum antibiotic. Patient required intubation. Echocardiogram with ejection fraction of 60% grade 1 diastolic dysfunction. Blood cultures no growth. Bouts of altered mental status. EEG negative for seizure. Cranial CT scan negative. Extubated 05/26/2015 and follow per critical care. Subcutaneous heparin for DVT prophylaxis. Patient remains nothing by mouth with plan swallow study. Physical therapy evaluation completed 05/27/2015 with recommendations of physical medicine rehabilitation consult.   Review of Systems  Constitutional: Negative for fever and chills.  HENT: Negative for hearing loss.   Eyes: Negative for blurred vision and double vision.  Respiratory: Positive for cough, sputum production, shortness of breath and wheezing.   Cardiovascular: Positive for leg swelling. Negative for chest pain and palpitations.  Gastrointestinal: Positive for constipation. Negative for nausea and vomiting.  Genitourinary: Negative for dysuria and hematuria.  Musculoskeletal: Positive for myalgias.  Skin: Negative for rash.  Neurological: Positive for weakness. Negative for seizures, loss of consciousness and headaches.  Psychiatric/Behavioral: Positive for depression.  All other systems reviewed and are negative.  No  past medical history on file. No past surgical history on file. No family history on file. Social History:  has no tobacco, alcohol, and drug history on file. Allergies: Not on File Medications Prior to Admission  Medication Sig Dispense Refill  . cetirizine (ZYRTEC) 10 MG tablet Take 10 mg by mouth daily as needed for allergies.    . Cyanocobalamin ([employer] B-12) 1000 MCG/15ML LIQD Take 15 mLs by mouth daily.    Marland Kitchen FLUoxetine (PROZAC) 10 MG capsule Take 40 mg by mouth daily.    Marland Kitchen HYDROcodone-acetaminophen (NORCO) 7.5-325 MG tablet Take 1 tablet by mouth every 6 (six) hours as needed. pain    . Multiple Vitamin (MULTIVITAMIN) tablet Take 1 tablet by mouth daily.    . naproxen sodium (ANAPROX) 220 MG tablet Take 220 mg by mouth 2 (two) times daily as needed (pain).    . Pseudoeph-Doxylamine-DM-APAP (NYQUIL MULTI-SYMPTOM PO) Take 1 capsule by mouth at bedtime as needed (cold symptoms).    . pseudoephedrine (SUDAFED) 30 MG tablet Take 30 mg by mouth every 4 (four) hours as needed for congestion.      Home: Home Living Family/patient expects to be discharged to:: Private residence Living Arrangements: Spouse/significant other, Children Available Help at Discharge: Family Type of Home: House Home Access: Stairs to enter [employer] of Steps: 1 through garage, 15 front door Entrance Stairs-Rails: Right Home Layout: Two level, Bed/bath upstairs Alternate Level Stairs-Number of Steps: 1 flight Alternate Level Stairs-Rails: Right Home Equipment: None  Functional History: Prior Function Level of Independence: Independent Comments: Pt confused at times and difficulty getting accurate PLOF/history. Functional Status:  Mobility: Bed Mobility Overal bed mobility: Needs Assistance Bed Mobility: Supine to Sit Supine to sit: Mod assist, HOB elevated General bed mobility comments: Assist with BLEs, to elevate trunk and scoot bottom to EOB. Increased time. + dizziness.  BP  95/74. Transfers Overall transfer level: Needs assistance Equipment used: 2 person hand held assist Transfers: Sit to/from Stand, Stand Pivot Transfers Sit to Stand: +2 physical assistance, Max assist Stand pivot transfers: Total assist, +2 physical assistance General transfer comment: Stood from EOB x1 with cues for hand placement/technique. Pt with posterior lean through hips. Total A of 2 SPT to chair with posterior lean- able to shuffle feet.       ADL:    Cognition: Cognition Overall Cognitive Status: Impaired/Different from baseline Orientation Level: Oriented to person, Oriented to place, Oriented to time, Disoriented to situation Cognition Arousal/Alertness: Awake/alert Overall Cognitive Status: Impaired/Different from baseline Area of Impairment: Safety/judgement, Problem solving, Orientation Orientation Level: Disoriented to, Place ("[facility]") Memory: Decreased short-term memory Safety/Judgement: Decreased awareness of safety Problem Solving: Slow processing General Comments: Pt tangential in speech. Starting to answer questions unrelated to question asked.   Blood pressure 104/73, pulse 93, temperature 97 F (36.1 C), temperature source Oral, resp. rate 19, height 5' (1.524 m), weight 46.2 kg (101 lb 13.6 oz), SpO2 95 %. Physical Exam  Constitutional:  65 year old frail Caucasian female sitting up in chair  HENT:  Head: Normocephalic and atraumatic.  Eyes: Conjunctivae and EOM are normal. Pupils are equal, round, and reactive to light.  Neck: Normal range of motion. Neck supple. No tracheal deviation present. No thyromegaly present.  Cardiovascular: Regular rhythm.   Tachycardic  100-110  Respiratory: She has no wheezes. She has no rales.  Decreased breath sounds at the bases with good inspiratory effort  GI: Soft. She exhibits no distension.  Neurological: She is alert. No cranial nerve deficit. Coordination normal.  Makes good eye contact with  examiner. Oriented to month and year. Did not know she was in [place] or [place]. Answered some biographical questions (unclear how accurate). No sensory deficits. MMT:UE  3/5 delt,bicep,wrist, tricep, 4/5 HI. LE: 3/5 HF, KE and 4/5 ADF/APF.   Skin: Skin is warm and dry.  Psychiatric:  Pleasant and cooperative. Slightly confused.     Results for orders placed or performed during the hospital encounter of 05/14/15 (from the past 24 hour(s))  Glucose, capillary     Status: Abnormal   Collection Time: 05/26/15  4:16 PM  Result Value Ref Range   Glucose-Capillary 143 (H) 65 - 99 mg/dL  Glucose, capillary     Status: Abnormal   Collection Time: 05/26/15  7:50 PM  Result Value Ref Range   Glucose-Capillary 120 (H) 65 - 99 mg/dL  Glucose, capillary     Status: Abnormal   Collection Time: 05/27/15 12:29 AM  Result Value Ref Range   Glucose-Capillary 140 (H) 65 - 99 mg/dL  Glucose, capillary     Status: Abnormal   Collection Time: 05/27/15  3:03 AM  Result Value Ref Range   Glucose-Capillary 115 (H) 65 - 99 mg/dL  CBC     Status: Abnormal   Collection Time: 05/27/15  3:12 AM  Result Value Ref Range   WBC 17.6 (H) 4.0 - 10.5 K/uL   RBC 4.41 3.87 - 5.11 MIL/uL   Hemoglobin 13.0 12.0 - 15.0 g/dL   HCT 78.4 69.6 - 29.5 %   MCV 96.6 78.0 - 100.0 fL   MCH 29.5 26.0 - 34.0 pg   MCHC 30.5 30.0 - 36.0 g/dL   RDW 28.4 13.2 - 44.0 %   Platelets 170 150 - 400 K/uL  Basic metabolic panel     Status: Abnormal   Collection Time: 05/27/15  3:12  AM  Result Value Ref Range   Sodium 144 135 - 145 mmol/L   Potassium 3.2 (L) 3.5 - 5.1 mmol/L   Chloride 95 (L) 101 - 111 mmol/L   CO2 33 (H) 22 - 32 mmol/L   Glucose, Bld 119 (H) 65 - 99 mg/dL   BUN 62 (H) 6 - 20 mg/dL   Creatinine, Ser 6.96 0.44 - 1.00 mg/dL   Calcium 78.9 8.9 - 38.1 mg/dL   GFR calc non Af Amer >60 >60 mL/min   GFR calc Af Amer >60 >60 mL/min   Anion gap 16 (H) 5 - 15  Magnesium     Status: Abnormal   Collection Time: 05/27/15  3:12  AM  Result Value Ref Range   Magnesium 2.8 (H) 1.7 - 2.4 mg/dL  Phosphorus     Status: Abnormal   Collection Time: 05/27/15  3:12 AM  Result Value Ref Range   Phosphorus 5.0 (H) 2.5 - 4.6 mg/dL  Glucose, capillary     Status: Abnormal   Collection Time: 05/27/15  8:03 AM  Result Value Ref Range   Glucose-Capillary 113 (H) 65 - 99 mg/dL  Blood gas, arterial     Status: Abnormal   Collection Time: 05/27/15 10:05 AM  Result Value Ref Range   O2 Content 0.5 L/min   Delivery systems NASAL CANNULA    pH, Arterial 7.491 (H) 7.350 - 7.450   pCO2 arterial 40.5 35.0 - 45.0 mmHg   pO2, Arterial 72.4 (L) 80.0 - 100.0 mmHg   Bicarbonate 30.6 (H) 20.0 - 24.0 mEq/L   TCO2 31.9 0 - 100 mmol/L   Acid-Base Excess 7.0 (H) 0.0 - 2.0 mmol/L   O2 Saturation 95.1 %   Patient temperature 98.6    Collection site LEFT RADIAL    Drawn by [id number]    Sample type ARTERIAL DRAW    Allens test (pass/fail) PASS PASS  Glucose, capillary     Status: Abnormal   Collection Time: 05/27/15 11:45 AM  Result Value Ref Range   Glucose-Capillary 139 (H) 65 - 99 mg/dL   Dg Chest Port 1 View  05/26/2015  CLINICAL DATA:  Endotracheal tube EXAM: PORTABLE CHEST 1 VIEW COMPARISON:  Yesterday FINDINGS: Endotracheal tube tip in stable position between the clavicular heads and carina. Left IJ central line with tip at the SVC level. Two enteric tubes at least reaches the stomach. Normal heart size and stable mediastinal contours. Unchanged lung volumes with basilar and peripheral predominant coarse interstitial opacities. No edema, effusion, or pneumothorax. IMPRESSION: 1. Stable positioning of tubes and central line. 2. Stable lung inflation since yesterday. Airspace opacities have decreased since admission. 3. Chronic lung disease with traction bronchiectasis on recent chest CT. Chronicity of residual interstitial opacities is uncertain. Electronically Signed   By: Marnee Spring M.D.   On: 05/26/2015 07:38     Assessment/Plan: Diagnosis: debility and encephalopathy due to multiple medical issues above 1. Does the need for close, 24 hr/day medical supervision in concert with the patient's rehab needs make it unreasonable for this patient to be served in a less intensive setting? Yes 2. Co-Morbidities requiring supervision/potential complications: ARDS, COPD, malnutrition 3. Due to bladder management, bowel management, safety, skin/wound care, disease management, medication administration and patient education, does the patient require 24 hr/day rehab nursing? Yes 4. Does the patient require coordinated care of a physician, rehab nurse, PT (1-2 hrs/day, 5 days/week), OT (1-2 hrs/day, 5 days/week) and SLP (1-2 hrs/day, 5 days/week) to address physical and functional  deficits in the context of the above medical diagnosis(es)? Yes Addressing deficits in the following areas: balance, endurance, locomotion, strength, transferring, bowel/bladder control, bathing, dressing, feeding, grooming, toileting, cognition, swallowing and psychosocial support 5. Can the patient actively participate in an intensive therapy program of at least 3 hrs of therapy per day at least 5 days per week? Yes 6. The potential for patient to make measurable gains while on inpatient rehab is excellent 7. Anticipated functional outcomes upon discharge from inpatient rehab are modified independent and supervision  with PT, modified independent and supervision with OT, modified independent and supervision with SLP. 8. Estimated rehab length of stay to reach the above functional goals is: 11-17 days 9. Does the patient have adequate social supports and living environment to accommodate these discharge functional goals? Yes 10. Anticipated D/C setting: Home 11. Anticipated post D/C treatments: HH therapy and Outpatient therapy 12. Overall Rehab/Functional Prognosis: excellent  RECOMMENDATIONS: This patient's condition is appropriate for  continued rehabilitative care in the following setting: CIR Patient has agreed to participate in recommended program. Yes Note that insurance prior authorization may be required for reimbursement for recommended care.  Comment: Rehab Admissions Coordinator to follow up.  Thanks,  Ranelle Oyster, MD, [place] [facility]     05/27/2015

## 2015-05-28 ENCOUNTER — Inpatient Hospital Stay (HOSPITAL_COMMUNITY): Payer: BLUE CROSS/BLUE SHIELD

## 2015-05-28 LAB — BASIC METABOLIC PANEL
ANION GAP: 10 (ref 5–15)
BUN: 58 mg/dL — ABNORMAL HIGH (ref 6–20)
CHLORIDE: 102 mmol/L (ref 101–111)
CO2: 31 mmol/L (ref 22–32)
Calcium: 10.2 mg/dL (ref 8.9–10.3)
Creatinine, Ser: 0.63 mg/dL (ref 0.44–1.00)
GFR calc Af Amer: >60 mL/min (ref >60)
Glucose, Bld: 132 mg/dL — ABNORMAL HIGH (ref 65–99)
POTASSIUM: 4.1 mmol/L (ref 3.5–5.1)
SODIUM: 143 mmol/L (ref 135–145)

## 2015-05-28 LAB — CBC
HCT: 45.5 % (ref 36.0–46.0)
HEMOGLOBIN: 14.4 g/dL (ref 12.0–15.0)
MCH: 30 pg (ref 26.0–34.0)
MCHC: 31.6 g/dL (ref 30.0–36.0)
MCV: 94.8 fL (ref 78.0–100.0)
PLATELETS: 220 10*3/uL (ref 150–400)
RBC: 4.8 MIL/uL (ref 3.87–5.11)
RDW: 14 % (ref 11.5–15.5)
WBC: 15.1 10*3/uL — AB (ref 4.0–10.5)

## 2015-05-28 LAB — GLUCOSE, CAPILLARY
GLUCOSE-CAPILLARY: 119 mg/dL — AB (ref 65–99)
GLUCOSE-CAPILLARY: 153 mg/dL — AB (ref 65–99)
Glucose-Capillary: 110 mg/dL — ABNORMAL HIGH (ref 65–99)
Glucose-Capillary: 112 mg/dL — ABNORMAL HIGH (ref 65–99)
Glucose-Capillary: 138 mg/dL — ABNORMAL HIGH (ref 65–99)

## 2015-05-28 MED ORDER — FOLIC ACID 1 MG PO TABS
1.0000 mg | ORAL_TABLET | Freq: Every day | ORAL | Status: DC
  Administered 2015-05-29 – 2015-05-30 (×2): 1 mg via ORAL
  Filled 2015-05-28 (×2): qty 1

## 2015-05-28 MED ORDER — VITAMIN B-1 100 MG PO TABS
100.0000 mg | ORAL_TABLET | Freq: Every day | ORAL | Status: DC
  Administered 2015-05-28 – 2015-05-30 (×3): 100 mg via ORAL
  Filled 2015-05-28 (×3): qty 1

## 2015-05-28 MED ORDER — ACETAMINOPHEN 160 MG/5ML PO SOLN
650.0000 mg | Freq: Four times a day (QID) | ORAL | Status: DC | PRN
  Filled 2015-05-28: qty 20.3

## 2015-05-28 NOTE — Progress Notes (Signed)
[facility] TEAM 1 - Stepdown/ICU TEAM PROGRESS NOTE  Julia Massey SHF:[id number] DOB: [birth year] DOA: 05/14/2015 PCP: No primary care provider on file.  Admit HPI / Brief Narrative: 65 y.o. female with Hx of HLD, anxiety, depression, and COPD who began to feel sick around Christmas with cough productive of thick green sputum and fevers (Tmax 102) / chills. She saw her PCP who placed her on a z- pack. She apparently was initially feeling somewhat better, then began to have worsening SOB again. She continued to have cough productive of thick green sputum and had some mild chest pain that was mostly associated with coughing.   On 1/04 she went to [facility] where she was found to be hypoxic to the point that she required BiPAP (was 73% on RA). Trial off BiPAP was attempted, but she failed. CTA was obtained which revealed extensive bilateral patchy infiltrates c/w atypical PNA. Additional labs noteable for WBC 20.8 (85% neut's), K 3.2, BNP 1252, Trop 0.039, lactic acid 2.5. Flu PCR was negative.  Significant Events: 1/04 admit with acute hypoxic respiratory failure and concern for atypical PNA 1/05 distress on bipap > intubated, bronchoscopy, a-line and central line placed 1/5 TTE - EF 55-60% - no WMA - grade 1 DD - severely reduced RV systolic fxn - PA presure 43mm Hg 1/16 extubated  1/18 [facility] assumed care   HPI/Subjective: The patient is seen following her MBS.  She is resting comfortably in bed.  She is alert and conversant though very mildly confused when detail questions are asked.  She denies chest pain shortness breath fevers chills nausea or vomiting.  She tells me she is interested temperature is spreading and long term rehabilitation.  Assessment/Plan:  Atypical PNA vs post viral pneumonitis > ARDS w/ acute resp failure  O2 sats look good on minimal oxygen support - wean as able  Acute pulmonary edema - Elevated BNP ?pulmonary edema secondary to IVF received  in ICU - diuretics now discontinued - appears euvolemic  Pulm HTN pa 52 > due to hypoxemia - follow up as outpatient with stabilization of respiratory status  Nutrition SLP still suggesting NPO status as of 1/17 - ?of esophageal dysmotility prior to admit - await results of today's MBS   HLD Continue crestor  Hypernatremia  Resolved   Metabolic alkolosis from diuresis improved after Lasix decreased and Diamox dosed 1/16 - follow trend   ?Relative adrenal insufficiency Cortisol was not checked - now off steroid - no indication for ongoing adrenal issues   Hx anxiety / depression Appears well compensated at present  Acute encephalopathy - resolved  Deconditioned  Awaiting determination on possible CIR stay   Code Status: FULL Family Communication: no family present at time of exam Disposition Plan: SDU - if no CIR bed available by 1/19 should be ready for transfer to med bed   Consultants: Rehab  PCCM   Antibiotics: Vanc 01/04 >1/7 Zosyn 01/04 >1/7 Azithro 01/04 >1/5 Levofloxacin 1/5 > 1/12  DVT prophylaxis: SQ heparin   Objective: Blood pressure 113/70, pulse 92, temperature 98.8 F (37.1 C), temperature source Oral, resp. rate 25, height 5' (1.524 m), weight 49.7 kg (109 lb 9.1 oz), SpO2 98 %.  Intake/Output Summary (Last 24 hours) at 05/28/15 0821 Last data filed at 05/28/15 0456  Gross per 24 hour  Intake    110 ml  Output   1500 ml  Net  -1390 ml   Exam: General: No acute respiratory distress at rest in bed  Lungs: Clear to auscultation bilaterally without wheezes or crackles Cardiovascular: Regular rate and rhythm without murmur gallop or rub normal S1 and S2 Abdomen: Nontender, nondistended, soft, bowel sounds positive, no rebound, no ascites, no appreciable mass Extremities: No significant cyanosis, clubbing, or edema bilateral lower extremities  Data Reviewed: Basic Metabolic Panel:  Recent Labs Lab 05/23/15 0310  05/23/15 1020  05/23/15 1907 05/24/15 0400 05/24/15 0830 05/24/15 1827 05/25/15 0514 05/25/15 0725 05/26/15 0500 05/27/15 0312  NA 138  --   --   --  136  --   --  139  --  145 144  K 4.4  --   --   --  4.2  --   --  3.9  --  3.1* 3.2*  CL 92*  --   --   --  90*  --   --  89*  --  96* 95*  CO2 35*  --   --   --  36*  --   --  39*  --  40* 33*  GLUCOSE 149*  --   --   --  187*  --   --  237*  --  159* 119*  BUN 18  --   --   --  30*  --   --  51*  --  54* 62*  CREATININE 0.52  --   --   --  0.52  --   --  0.57  --  0.50 0.50  CALCIUM 8.6*  --   --   --  9.2  --   --  9.5  --  9.5 10.3  MG  --   < > 2.1 2.0  --  2.0 2.1  --  2.3 2.4 2.8*  PHOS  --   --  3.7 3.3  --   --   --   --  4.4 3.9 5.0*  < > = values in this interval not displayed.  CBC:  Recent Labs Lab 05/23/15 0310 05/24/15 0400 05/25/15 0514 05/26/15 0500 05/27/15 0312 05/28/15 0718  WBC 21.6* 17.0* 14.7* 14.9* 17.6* 15.1*  NEUTROABS 19.6*  --   --   --   --   --   HGB 11.0* 12.3 12.2 11.7* 13.0 14.4  HCT 35.1* 38.5 38.6 38.8 42.6 45.5  MCV 93.9 93.7 94.6 95.8 96.6 94.8  PLT 134* 96* 106* 128* 170 220    Liver Function Tests: No results for input(s): AST, ALT, ALKPHOS, BILITOT, PROT, ALBUMIN in the last 168 hours. No results for input(s): LIPASE, AMYLASE in the last 168 hours.    Recent Labs Lab 05/23/15 1108  AMMONIA 16   CBG:  Recent Labs Lab 05/27/15 1542 05/27/15 1952 05/28/15 0017 05/28/15 0432 05/28/15 0805  GLUCAP 113* 110* 110* 112* 119*    Recent Results (from the past 240 hour(s))  Culture, respiratory (NON-Expectorated)     Status: None   Collection Time: 05/21/15 11:26 AM  Result Value Ref Range Status   Specimen Description TRACHEAL ASPIRATE  Final   Special Requests Normal  Final   Gram Stain   Final    FEW WBC PRESENT,BOTH PMN AND MONONUCLEAR RARE SQUAMOUS EPITHELIAL CELLS PRESENT RARE YEAST Performed at [facility]    Culture   Final    MODERATE YEAST CONSISTENT WITH CANDIDA  SPECIES Performed at [facility]    Report Status 05/24/2015 FINAL  Final  Culture, blood (Routine X 2) w Reflex to ID Panel  Status: None   Collection Time: 05/21/15 12:25 PM  Result Value Ref Range Status   Specimen Description BLOOD RIGHT ARM  Final   Special Requests BOTTLES DRAWN AEROBIC AND ANAEROBIC 5CC  Final   Culture NO GROWTH 5 DAYS  Final   Report Status 05/26/2015 FINAL  Final  Culture, blood (Routine X 2) w Reflex to ID Panel     Status: None   Collection Time: 05/21/15 12:33 PM  Result Value Ref Range Status   Specimen Description BLOOD RIGHT HAND  Final   Special Requests IN PEDIATRIC BOTTLE 3CC  Final   Culture NO GROWTH 5 DAYS  Final   Report Status 05/26/2015 FINAL  Final     Studies:   Recent x-ray studies have been reviewed in detail by the Attending Physician  Scheduled Meds:  Scheduled Meds: . antiseptic oral rinse  7 mL Mouth Rinse BID  . FLUoxetine  40 mg Oral Daily  . folic acid  1 mg Per Tube Daily  . heparin subcutaneous  5,000 Units Subcutaneous 3 times per day  . multivitamin with minerals  1 tablet Oral Daily  . polyethylene glycol  17 g Oral Daily  . sodium chloride  10-40 mL Intracatheter Q12H  . thiamine IV  100 mg Intravenous Daily    Time spent on care of this patient: 35 mins   Caroll Cunnington T , MD   [facility] Office  [phone] Pager - Text Page per Loretha Stapler as per below:  On-Call/Text Page:      [email or web address]      password [email or web address]  If 7PM-7AM, please contact night-coverage [email or web address] Password [email or web address] 05/28/2015, 8:21 AM   LOS: 14 days

## 2015-05-28 NOTE — Evaluation (Signed)
Occupational Therapy Evaluation Patient Details Name: Julia Massey MRN: [id number] DOB: 10-10-1950 Today's Date: 05/28/2015    History of Present Illness Patient is a 65 y/o female with hx of HLD, anxiety, depression, and COPD presents with hypoxic to the point that she required BiPAP (was 73% on RA).CTA -revealed extensive bilateral patchy infiltrates c/w atypical PNA.  Intubated 1/5-1/12, re-intubated 1/12-1/16,    Clinical Impression   Patient presenting with decreased ADL and functional mobility independence. Patient independent PTA. Patient currently functioning at an overall min to mod assist level, pt may need +2 for functional mobility. Patient will benefit from acute OT to increase overall independence in the areas of ADLs, functional mobility, and overall safety in order to safely discharge to venue listed below.     Follow Up Recommendations  CIR;Supervision/Assistance - 24 hour    Equipment Recommendations  Other (comment) (TBD next venue of care)    Recommendations for Other Services Rehab consult   Precautions / Restrictions Precautions Precautions: Fall Restrictions Weight Bearing Restrictions: No      Mobility Bed Mobility Overal bed mobility: Needs Assistance Bed Mobility: Supine to Sit     Supine to sit: Mod assist     General bed mobility comments: Mod assist for BLE management and trunk support. Pt with complaints of some dizziness that quickly subsided   Transfers Overall transfer level: Needs assistance Equipment used: None Transfers: Sit to/from Stand Sit to Stand: Min assist Stand pivot transfers: Min assist       General transfer comment: Shuffle SPT to recliner. No AD used, therapist infront of patient assisting with balance and technique for sit to/from stand and SPT.     Balance Overall balance assessment: Needs assistance Sitting-balance support: No upper extremity supported;Feet supported Sitting balance-Leahy Scale:  Fair Sitting balance - Comments: Able to sit unsupported.    Standing balance support: No upper extremity supported;During functional activity Standing balance-Leahy Scale: Poor Standing balance comment: posterior lean not very significant     ADL Overall ADL's : Needs assistance/impaired Eating/Feeding: Set up;Sitting   Grooming: Minimal assistance;Sitting Grooming Details (indicate cue type and reason): EOB, pt also trying to sit "[place] style" in bed  Upper Body Bathing: Minimal assitance;Sitting   Lower Body Bathing: Moderate assistance;Sit to/from stand   Upper Body Dressing : Minimal assistance;Sitting   Lower Body Dressing: Moderate assistance;Sit to/from stand   Toilet Transfer: Minimal Cabin crew Details (indicate cue type and reason): simulated EOB to recliner transfer    Toileting - Clothing Manipulation Details (indicate cue type and reason): did not occur   Tub/Shower Transfer Details (indicate cue type and reason): did not occur Functional mobility during ADLs: Minimal assistance;Cueing for safety;Cueing for sequencing General ADL Comments: Pt motivated to regain independence    Pertinent Vitals/Pain Pain Assessment: No/denies pain Faces Pain Scale: No hurt     Hand Dominance Right   Extremity/Trunk Assessment Upper Extremity Assessment Upper Extremity Assessment: Generalized weakness   Lower Extremity Assessment Lower Extremity Assessment: Generalized weakness   Cervical / Trunk Assessment Cervical / Trunk Assessment:  (weak neck extensors)   Communication Communication Communication: No difficulties   Cognition Arousal/Alertness: Awake/alert Behavior During Therapy: WFL for tasks assessed/performed Overall Cognitive Status: Impaired/Different from baseline Area of Impairment: Safety/judgement;Problem solving;Awareness     Memory: Decreased short-term memory   Safety/Judgement: Decreased awareness of  safety Awareness: Emergent Problem Solving: Slow processing General Comments: While performing tasks, pt somewhat antsy and trying to sit "[place] style" while therapist assisting with grooming  sitting EOB.               Home Living Family/patient expects to be discharged to:: Private residence Living Arrangements: Spouse/significant other;Children Available Help at Discharge: Family Type of Home: House Home Access: Stairs to enter [employer] of Steps: 1 through garage, 15 front door Entrance Stairs-Rails: Right Home Layout: Two level;Bed/bath upstairs Alternate Level Stairs-Number of Steps: 1 flight Alternate Level Stairs-Rails: Right Bathroom Shower/Tub: Tub/shower unit;Curtain   Bathroom Toilet: Standard     Home Equipment: None   Additional Comments: All above is per PT, except pt did tell pt she had a tub/shower and standard height toilet seats       Prior Functioning/Environment Level of Independence: Independent     OT Diagnosis: Generalized weakness   OT Problem List: Decreased strength;Decreased activity tolerance;Impaired balance (sitting and/or standing);Decreased safety awareness;Decreased knowledge of use of DME or AE;Decreased knowledge of precautions;Cardiopulmonary status limiting activity   OT Treatment/Interventions: Self-care/ADL training;Therapeutic exercise;Energy conservation;DME and/or AE instruction;Therapeutic activities;Patient/family education;Balance training    OT Goals(Current goals can be found in the care plan section) Acute Rehab OT Goals Patient Stated Goal: get stronger, go to rehab OT Goal Formulation: With patient Time For Goal Achievement: 06/11/15 Potential to Achieve Goals: Good ADL Goals Pt Will Perform Grooming: with supervision;sitting Pt Will Perform Lower Body Bathing: with min guard assist;sit to/from stand Pt Will Perform Lower Body Dressing: with min guard assist;sit to/from stand Pt Will Transfer to Toilet:  with min guard assist;ambulating;bedside commode Pt/caregiver will Perform Home Exercise Program: Increased ROM;Increased strength;Both right and left upper extremity;With Supervision;With written HEP provided;With theraband Additional ADL Goal #1: Pt will be mod I with bed mobility as a precursor for ADL   OT Frequency: Min 2X/week   Barriers to D/C: none known at this time   End of Session Equipment Utilized During Treatment: Gait belt Nurse Communication: Mobility status  Activity Tolerance: Patient tolerated treatment well Patient left: in chair;with call bell/phone within reach;with chair alarm set;with nursing/sitter in room   Time: 1430-1458 OT Time Calculation (min): 28 min Charges:  OT General Charges $OT Visit: 1 Procedure OT Evaluation $OT Eval Moderate Complexity: 1 Procedure OT Treatments $Self Care/Home Management : 8-22 mins   Edwin Cap , MS, OTR/L, CLT Pager: [phone]   05/28/2015, 3:14 PM

## 2015-05-28 NOTE — NC FL2 (Signed)
[place] MEDICAID FL2 LEVEL OF CARE SCREENING TOOL     IDENTIFICATION  Patient Name: DWIGHT BURDO Birthdate: 1951-02-07 Sex: female Admission Date (Current Location): 05/14/2015  [facility] and [place] Number:  Producer, television/film/video and Address:  [facility], [address] [place], [place] [address]      Provider Number: [id number]  Attending Physician Name and Address:  Lonia Blood, MD  Relative Name and Phone Number:       Current Level of Care: Hospital Recommended Level of Care: Skilled Nursing Facility Prior Approval Number:    Date Approved/Denied:   PASRR Number:    Discharge Plan: SNF    Current Diagnoses: Patient Active Problem List   Diagnosis Date Noted  . Acute respiratory failure with hypoxemia (HCC)   . Altered mental status   . COPD exacerbation (HCC)   . Encephalopathy acute   . ARDS (adult respiratory distress syndrome) (HCC)   . Encounter for orogastric (OG) tube placement   . Respiratory failure (HCC)   . Acute respiratory failure with hypoxia (HCC) 05/14/2015  . Malnutrition of moderate degree 05/14/2015    Orientation RESPIRATION BLADDER Height & Weight    Self, Time, Situation, Place  O2 (2L [place]) Continent 5' (152.4 cm) 109 lbs.  BEHAVIORAL SYMPTOMS/MOOD NEUROLOGICAL BOWEL NUTRITION STATUS      Continent Diet  AMBULATORY STATUS COMMUNICATION OF NEEDS Skin   Extensive Assist Verbally Normal                       Personal Care Assistance Level of Assistance  Bathing, Dressing Bathing Assistance: Maximum assistance   Dressing Assistance: Maximum assistance     Functional Limitations Info             SPECIAL CARE FACTORS FREQUENCY  PT (By licensed PT)     PT Frequency: 5/wk              Contractures      Additional Factors Info  Code Status, Allergies Code Status Info: FULL Allergies Info: NKA           Current Medications (05/28/2015):  This is the current hospital active  medication list Current Facility-Administered Medications  Medication Dose Route Frequency Provider Last Rate Last Dose  . 0.9 %  sodium chloride infusion   Intravenous Continuous Courtney Paris, MD   Stopped at 05/17/15 0345  . 0.9 %  sodium chloride infusion  250 mL Intravenous PRN Rahul P Desai, PA-C      . 0.9 %  sodium chloride infusion   Intravenous Continuous Lupita Leash, MD 10 mL/hr at 05/26/15 2000    . acetaminophen (TYLENOL) solution 650 mg  650 mg Oral Q6H PRN Darreld Mclean, MD      . antiseptic oral rinse (CPC / CETYLPYRIDINIUM CHLORIDE 0.05%) solution 7 mL  7 mL Mouth Rinse BID Leslye Peer, MD   7 mL at 05/28/15 1000  . bisacodyl (DULCOLAX) suppository 10 mg  10 mg Rectal Daily PRN Lupita Leash, MD   10 mg at 05/22/15 1645  . dextrose 5 % and 0.45 % NaCl with KCl 40 mEq/L infusion   Intravenous Continuous Merwyn Katos, MD 50 mL/hr at 05/28/15 0028 1,000 mL at 05/28/15 0028  . FLUoxetine (PROZAC) capsule 40 mg  40 mg Oral Daily Belinda Fisher Stone, RPH   40 mg at 05/28/15 5284  . folic acid (FOLVITE) tablet 1 mg  1 mg  Per Tube Daily Lupita Leash, MD   1 mg at 05/28/15 3382  . heparin injection 5,000 Units  5,000 Units Subcutaneous 3 times per day Fuller Plan, MD   5,000 Units at 05/28/15 [phone]  . ipratropium-albuterol (DUONEB) 0.5-2.5 (3) MG/3ML nebulizer solution 3 mL  3 mL Nebulization Q4H PRN Cyril Mourning V, MD      . multivitamin with minerals tablet 1 tablet  1 tablet Oral Daily Darreld Mclean, MD   1 tablet at 05/28/15 0907  . polyethylene glycol (MIRALAX / GLYCOLAX) packet 17 g  17 g Oral Daily Lupita Leash, MD   17 g at 05/28/15 0907  . sodium chloride 0.9 % injection 10-40 mL  10-40 mL Intracatheter Q12H Nelda Bucks, MD   10 mL at 05/28/15 0912  . sodium chloride 0.9 % injection 10-40 mL  10-40 mL Intracatheter PRN Nelda Bucks, MD      . thiamine (B-1) injection 100 mg  100 mg Intravenous Daily Lupita Leash, MD   100 mg at 05/28/15 0907      Discharge Medications: Please see discharge summary for a list of discharge medications.  Relevant Imaging Results:  Relevant Lab Results:   Additional Information SS#: [id number]  Izora Ribas, [place]    Lonia Blood, MD [facility] Office  [phone]

## 2015-05-28 NOTE — Progress Notes (Signed)
Speech Language Pathology Dysphagia Treatment Patient Details Name: HALAYNA BLANE MRN: [id number] DOB: [birth year] Today's Date: 05/28/2015 Time:  -     Assessment / Plan / Recommendation Clinical Impression    SLP provided skilled observation and upgraded PO trials to advise diet advancement and need for further instrumental testing. No oral phase s/s were noted. During intake of nectar thick juice, delayed throat clear x1 and delayed coughing x6 were observed, indicative of airway compromise. Pt c/o globus sensation during nectar thick juice intake. Delayed coughing x1 was observed during intake of honey thick juice. MBSS scheduled at 1330 today to advise diet advancement. Pt was educated re: instrumental testing and compensatory strategies such as chin tuck and thickened liquids. Treatment plan to be decided after MBSS.    Diet Recommendation    NPO         Swallowing Goals     General HPI: Pt admitted with acute hypoxic respiratory failure and concern for atypical pna. Intubated on 1/5 to 1/15. BSE done on 05/14/15 "Patient with a suspected primary esophageal dysphagia characterized by what appears to be a normal oropharyngeal swallow without overt indication of aspiration with tested pos but with chronic appearing throat clear (at baseline), c/o globus, mid sternal pressure during po intake, and occassional regurgitation of solid pos" Pt recommended to undergo a barium swallow, but pt went into respiratory distress before this was done.   Oral Cavity - Oral Hygiene     Dysphagia Treatment     GO     Alyia Lacerte 05/28/2015, 1:28 PM    Lynita Lombard, Student-SLP

## 2015-05-28 NOTE — H&P (Signed)
Physical Medicine and Rehabilitation Admission H&P    Chief complaint: Weakness  IWP:YKDXIPJ L Ueda is a 65 y.o. right handed female with history of tobacco abuse ,COPD . Patient lives with spouse independent prior to admission. 2 level home with bedroom upstairs. Presented 05/15/2015 with productive cough low-grade fever and chills. Recently placed on a Z-Pak by her PCP. Found to be hypoxic 73% on room air. CT of the chest revealed extensive bilateral patchy infiltrates atypical pneumonia. No evidence of pulmonary emboli. White blood cell count 20,800, lactic acid 2.5. Potassium 3.2. Placed on broad-spectrum antibiotic. Patient required intubation. Echocardiogram with ejection fraction of 82% grade 1 diastolic dysfunction. Blood cultures no growth. Bouts of altered mental status. EEG negative for seizure. Cranial CT scan negative. Extubated 05/26/2015 and follow per critical care. Subcutaneous heparin for DVT prophylaxis. Swallow study completed 05/28/2015 and diet advanced to dysphagia #2 nectar thick liquids. . Physical therapy evaluation completed 05/27/2015 with recommendations of physical medicine rehabilitation consult. Patient was admitted for comprehensive rehabilitation program.   ROS Constitutional: Negative for fever and chills.  HENT: Negative for hearing loss.  Eyes: Negative for blurred vision and double vision.  Respiratory: Positive for cough, sputum production, shortness of breath and wheezing.  Cardiovascular: Positive for leg swelling. Negative for chest pain and palpitations.  Gastrointestinal: Positive for constipation. Negative for nausea and vomiting.  Genitourinary: Negative for dysuria and hematuria.  Musculoskeletal: Positive for myalgias.  Skin: Negative for rash.  Neurological: Positive for weakness. Negative for seizures, loss of consciousness and headaches.  Psychiatric/Behavioral: Positive for depression.  All other systems reviewed and are negative   No  past medical history on file. No past surgical history on file. No family history on file. Social History:  has no tobacco, alcohol, and drug history on file. Allergies: Not on File Medications Prior to Admission  Medication Sig Dispense Refill  . cetirizine (ZYRTEC) 10 MG tablet Take 10 mg by mouth daily as needed for allergies.    . Cyanocobalamin ([employer] B-12) 1000 MCG/15ML LIQD Take 15 mLs by mouth daily.    Marland Kitchen FLUoxetine (PROZAC) 10 MG capsule Take 40 mg by mouth daily.    Marland Kitchen HYDROcodone-acetaminophen (NORCO) 7.5-325 MG tablet Take 1 tablet by mouth every 6 (six) hours as needed. pain    . Multiple Vitamin (MULTIVITAMIN) tablet Take 1 tablet by mouth daily.    . naproxen sodium (ANAPROX) 220 MG tablet Take 220 mg by mouth 2 (two) times daily as needed (pain).    . Pseudoeph-Doxylamine-DM-APAP (NYQUIL MULTI-SYMPTOM PO) Take 1 capsule by mouth at bedtime as needed (cold symptoms).    . pseudoephedrine (SUDAFED) 30 MG tablet Take 30 mg by mouth every 4 (four) hours as needed for congestion.      Home: Home Living Family/patient expects to be discharged to:: Private residence Living Arrangements: Spouse/significant other, Children Available Help at Discharge: Family Type of Home: House Home Access: Stairs to enter [employer] of Steps: 1 through garage, 15 front door Entrance Stairs-Rails: Right Home Layout: Two level, Bed/bath upstairs Alternate Level Stairs-Number of Steps: 1 flight Alternate Level Stairs-Rails: Right Home Equipment: None   Functional History: Prior Function Level of Independence: Independent Comments: Pt confused at times and difficulty getting accurate PLOF/history.  Functional Status:  Mobility: Bed Mobility Overal bed mobility: Needs Assistance Bed Mobility: Supine to Sit Supine to sit: Mod assist, HOB elevated General bed mobility comments: Assist with BLEs, to elevate trunk and scoot bottom to EOB. Increased time. + dizziness.  BP  95/74. Transfers Overall transfer level: Needs assistance Equipment used: 2 person hand held assist Transfers: Sit to/from Stand, Stand Pivot Transfers Sit to Stand: +2 physical assistance, Max assist Stand pivot transfers: Total assist, +2 physical assistance General transfer comment: Stood from EOB x1 with cues for hand placement/technique. Pt with posterior lean through hips. Total A of 2 SPT to chair with posterior lean- able to shuffle feet.       ADL:    Cognition: Cognition Overall Cognitive Status: Impaired/Different from baseline Orientation Level: Oriented X4 Cognition Arousal/Alertness: Awake/alert Overall Cognitive Status: Impaired/Different from baseline Area of Impairment: Safety/judgement, Problem solving, Orientation Orientation Level: Disoriented to, Place ("[facility]") Memory: Decreased short-term memory Safety/Judgement: Decreased awareness of safety Problem Solving: Slow processing General Comments: Pt tangential in speech. Starting to answer questions unrelated to question asked.   Physical Exam: Blood pressure 113/70, pulse 92, temperature 98.8 F (37.1 C), temperature source Oral, resp. rate 25, height 5' (1.524 m), weight 49.7 kg (109 lb 9.1 oz), SpO2 98 %. Physical Exam Constitutional:  65 year old frail Caucasian female sitting up in chair  HENT:  Head: Normocephalic and atraumatic.  Eyes: Conjunctivae and EOM are normal. Pupils are equal, round, and reactive to light.  Neck: Normal range of motion. Neck supple. No tracheal deviation present. No thyromegaly present.  Cardiovascular: Regular rhythm.  HR 90's  Respiratory: She has no wheezes. She has no rales.  Decreased breath sounds at the bases with good inspiratory effort. Chest tender to palpation along left ribs 4-6. More pain with inspiration GI: Soft. She exhibits no distension.  Neurological: She is alert. No cranial nerve deficit. Coordination normal.  Makes good eye contact  with examiner. Oriented to month and year, place. Improved awareness in general.  Answered biographical questions.  No sensory deficits. MMT remains: UE 3/5 delt,bicep,wrist, tricep, 4/5 HI. LE: 3/5 HF, KE and 4/5 ADF/APF.  Skin: Skin is warm and dry.  Psychiatric:  Pleasant and cooperative. Less confused.    Results for orders placed or performed during the hospital encounter of 05/14/15 (from the past 48 hour(s))  Glucose, capillary     Status: Abnormal   Collection Time: 05/26/15 11:26 AM  Result Value Ref Range   Glucose-Capillary 160 (H) 65 - 99 mg/dL  Glucose, capillary     Status: Abnormal   Collection Time: 05/26/15  4:16 PM  Result Value Ref Range   Glucose-Capillary 143 (H) 65 - 99 mg/dL  Glucose, capillary     Status: Abnormal   Collection Time: 05/26/15  7:50 PM  Result Value Ref Range   Glucose-Capillary 120 (H) 65 - 99 mg/dL  Glucose, capillary     Status: Abnormal   Collection Time: 05/27/15 12:29 AM  Result Value Ref Range   Glucose-Capillary 140 (H) 65 - 99 mg/dL  Glucose, capillary     Status: Abnormal   Collection Time: 05/27/15  3:03 AM  Result Value Ref Range   Glucose-Capillary 115 (H) 65 - 99 mg/dL  CBC     Status: Abnormal   Collection Time: 05/27/15  3:12 AM  Result Value Ref Range   WBC 17.6 (H) 4.0 - 10.5 K/uL   RBC 4.41 3.87 - 5.11 MIL/uL   Hemoglobin 13.0 12.0 - 15.0 g/dL   HCT 42.6 36.0 - 46.0 %   MCV 96.6 78.0 - 100.0 fL   MCH 29.5 26.0 - 34.0 pg   MCHC 30.5 30.0 - 36.0 g/dL   RDW 14.2 11.5 - 15.5 %  Platelets 170 150 - 400 K/uL  Basic metabolic panel     Status: Abnormal   Collection Time: 05/27/15  3:12 AM  Result Value Ref Range   Sodium 144 135 - 145 mmol/L   Potassium 3.2 (L) 3.5 - 5.1 mmol/L   Chloride 95 (L) 101 - 111 mmol/L   CO2 33 (H) 22 - 32 mmol/L   Glucose, Bld 119 (H) 65 - 99 mg/dL   BUN 62 (H) 6 - 20 mg/dL   Creatinine, Ser 0.50 0.44 - 1.00 mg/dL   Calcium 10.3 8.9 - 10.3 mg/dL   GFR calc non Af Amer >60 >60 mL/min    GFR calc Af Amer >60 >60 mL/min    Comment: (NOTE) The eGFR has been calculated using the CKD EPI equation. This calculation has not been validated in all clinical situations. eGFR's persistently <60 mL/min signify possible Chronic Kidney Disease.    Anion gap 16 (H) 5 - 15  Magnesium     Status: Abnormal   Collection Time: 05/27/15  3:12 AM  Result Value Ref Range   Magnesium 2.8 (H) 1.7 - 2.4 mg/dL  Phosphorus     Status: Abnormal   Collection Time: 05/27/15  3:12 AM  Result Value Ref Range   Phosphorus 5.0 (H) 2.5 - 4.6 mg/dL  Glucose, capillary     Status: Abnormal   Collection Time: 05/27/15  8:03 AM  Result Value Ref Range   Glucose-Capillary 113 (H) 65 - 99 mg/dL  Blood gas, arterial     Status: Abnormal   Collection Time: 05/27/15 10:05 AM  Result Value Ref Range   O2 Content 0.5 L/min   Delivery systems NASAL CANNULA    pH, Arterial 7.491 (H) 7.350 - 7.450   pCO2 arterial 40.5 35.0 - 45.0 mmHg   pO2, Arterial 72.4 (L) 80.0 - 100.0 mmHg   Bicarbonate 30.6 (H) 20.0 - 24.0 mEq/L   TCO2 31.9 0 - 100 mmol/L   Acid-Base Excess 7.0 (H) 0.0 - 2.0 mmol/L   O2 Saturation 95.1 %   Patient temperature 98.6    Collection site LEFT RADIAL    Drawn by [id number]    Sample type ARTERIAL DRAW    Allens test (pass/fail) PASS PASS  Glucose, capillary     Status: Abnormal   Collection Time: 05/27/15 11:45 AM  Result Value Ref Range   Glucose-Capillary 139 (H) 65 - 99 mg/dL  Glucose, capillary     Status: Abnormal   Collection Time: 05/27/15  3:42 PM  Result Value Ref Range   Glucose-Capillary 113 (H) 65 - 99 mg/dL  Glucose, capillary     Status: Abnormal   Collection Time: 05/27/15  7:52 PM  Result Value Ref Range   Glucose-Capillary 110 (H) 65 - 99 mg/dL  Glucose, capillary     Status: Abnormal   Collection Time: 05/28/15 12:17 AM  Result Value Ref Range   Glucose-Capillary 110 (H) 65 - 99 mg/dL  Glucose, capillary     Status: Abnormal   Collection Time: 05/28/15  4:32 AM   Result Value Ref Range   Glucose-Capillary 112 (H) 65 - 99 mg/dL  CBC     Status: Abnormal   Collection Time: 05/28/15  7:18 AM  Result Value Ref Range   WBC 15.1 (H) 4.0 - 10.5 K/uL   RBC 4.80 3.87 - 5.11 MIL/uL   Hemoglobin 14.4 12.0 - 15.0 g/dL   HCT 45.5 36.0 - 46.0 %   MCV 94.8 78.0 - 100.0 fL  MCH 30.0 26.0 - 34.0 pg   MCHC 31.6 30.0 - 36.0 g/dL   RDW 14.0 11.5 - 15.5 %   Platelets 220 150 - 400 K/uL  Basic metabolic panel     Status: Abnormal   Collection Time: 05/28/15  7:18 AM  Result Value Ref Range   Sodium 143 135 - 145 mmol/L   Potassium 4.1 3.5 - 5.1 mmol/L   Chloride 102 101 - 111 mmol/L   CO2 31 22 - 32 mmol/L   Glucose, Bld 132 (H) 65 - 99 mg/dL   BUN 58 (H) 6 - 20 mg/dL   Creatinine, Ser 0.63 0.44 - 1.00 mg/dL   Calcium 10.2 8.9 - 10.3 mg/dL   GFR calc non Af Amer >60 >60 mL/min   GFR calc Af Amer >60 >60 mL/min    Comment: (NOTE) The eGFR has been calculated using the CKD EPI equation. This calculation has not been validated in all clinical situations. eGFR's persistently <60 mL/min signify possible Chronic Kidney Disease.    Anion gap 10 5 - 15  Glucose, capillary     Status: Abnormal   Collection Time: 05/28/15  8:05 AM  Result Value Ref Range   Glucose-Capillary 119 (H) 65 - 99 mg/dL   Dg Esophagus  05/27/2015  CLINICAL DATA:  Dysphagia. Chronic esophageal dysphagia with acute reversible oralpharyngeal dysphagia after prolonged intubation. Per speech pathology, patient not safe to advance diet. Study requested regardless. EXAM: ESOPHOGRAM/BARIUM SWALLOW TECHNIQUE: Single contrast examination was performed using  thin barium. FLUOROSCOPY TIME:  If the device does not provide the exposure index: Fluoroscopy Time:  3 minutes and 0 seconds Number of Acquired Images:  None COMPARISON:  Chest radiograph of 05/26/2015 FINDINGS: Moderately limited exam. Patient was relatively immobile and evaluated in a left lateral and LPO position. Left lateral evaluation of  the hypopharynx demonstrates small volume penetration and trace aspiration. Prominent cricopharyngeus muscle. Limited evaluation of esophageal peristalsis demonstrates suboptimal primary peristaltic wave with contrast stasis throughout the esophagus. Attempted full column evaluation of the esophagus demonstrates no persistent narrowing or high-grade stricture. IMPRESSION: 1. Moderately degraded exam, as detailed above. 2. Penetration and trace aspiration with single swallows. Please see speech pathology evaluation. 3. Esophageal dysmotility, likely presbyesophagus. 4. No high-grade stricture or other anatomic cause for patient's symptoms within the thoracic esophagus. Electronically Signed   By: Abigail Miyamoto M.D.   On: 05/27/2015 16:17   Dg Chest Port 1 View  05/28/2015  CLINICAL DATA:  Respiratory distress. EXAM: PORTABLE CHEST 1 VIEW COMPARISON:  05/26/2015.  05/25/2015.  05/21/2015 .  CT 05/15/2015 FINDINGS: Mediastinum hilar structures normal. Cardiomegaly with normal pulmonary vascularity. Cardiomegaly. Bilateral pulmonary interstitial prominence. Low lung volumes with basilar atelectasis unchanged. No pleural effusion or pneumothorax. IMPRESSION: 1. Stable bilateral pulmonary interstitial prominence. Stable bibasilar atelectasis. 2. Stable cardiomegaly. Electronically Signed   By: Marcello Moores  Register   On: 05/28/2015 07:31       Medical Problem List and Plan:  1.  Debilitation with encephalopathy secondary to acute respiratory failure 2.  DVT Prophylaxis/Anticoagulation: Subcutaneous heparin. Monitor platelet counts of any signs of bleeding 3. Pain Management/musculoskeletal chest and back pain: Tylenol as needed. Ice pack as needed 4. Mood: Continue Prozac 40 mg daily 5. Neuropsych: This patient is capable of making decisions on her own behalf. 6. Skin/Wound Care: Routine skin checks 7. Fluids/Electrolytes/Nutrition: Routine eye and nose with plan follow-up chemistries 8. Tobacco abuse/COPD.  Provide counseling. Check oxygen saturations every shift 9. Dysphagia. Dysphagia #2 nectar thick liquids.  Follow-up speech therapy. Monitor hydration 10. Chest wall pain: appears musculoskeletal. Ice applied, analgesics, monitor for now.     Post Admission Physician Evaluation: 1. Functional deficits secondary  to encephalopathy, deconditioning. 2. Patient is admitted to receive collaborative, interdisciplinary care between the physiatrist, rehab nursing staff, and therapy team. 3. Patient's level of medical complexity and substantial therapy needs in context of that medical necessity cannot be provided at a lesser intensity of care such as a SNF. 4. Patient has experienced substantial functional loss from his/her baseline which was documented above under the "Functional History" and "Functional Status" headings.  Judging by the patient's diagnosis, physical exam, and functional history, the patient has potential for functional progress which will result in measurable gains while on inpatient rehab.  These gains will be of substantial and practical use upon discharge  in facilitating mobility and self-care at the household level. 5. Physiatrist will provide 24 hour management of medical needs as well as oversight of the therapy plan/treatment and provide guidance as appropriate regarding the interaction of the two. 6. 24 hour rehab nursing will assist with bladder management, bowel management, safety, skin/wound care, disease management, medication administration, pain management and patient education  and help integrate therapy concepts, techniques,education, etc. 7. PT will assess and treat for/with: Lower extremity strength, range of motion, stamina, balance, functional mobility, safety, adaptive techniques and equipment, NMR, pain mgt, cognitive-perceptual rx, family and patient education, community reintegration.   Goals are: supervision. 8. OT will assess and treat for/with: ADL's, functional  mobility, safety, upper extremity strength, adaptive techniques and equipment, NMR, cognitive perceptual rx, family and patient education.   Goals are: supervision. Therapy may proceed with showering this patient. 9. SLP will assess and treat for/with: cognition, communication, swallowing, behavior, education.  Goals are: supervision. 10. Case Management and Social Worker will assess and treat for psychological issues and discharge planning. 11. Team conference will be held weekly to assess progress toward goals and to determine barriers to discharge. 12. Patient will receive at least 3 hours of therapy per day at least 5 days per week. 13. ELOS: 10-15 days       14. Prognosis:  excellent     Meredith Staggers, MD, [facility] 05/30/2015   05/28/2015

## 2015-05-28 NOTE — Progress Notes (Signed)
We will begin [employer] approval for possible admission plan noted per Dr. Sharon Seller on 05/29/15. Weldon Picking will see pt on Thursday and can be reached at [phone]. Admission pending insurance approval tomorrow. [phone]

## 2015-05-29 DIAGNOSIS — G9340 Encephalopathy, unspecified: Secondary | ICD-10-CM | POA: Diagnosis present

## 2015-05-29 DIAGNOSIS — I5032 Chronic diastolic (congestive) heart failure: Secondary | ICD-10-CM

## 2015-05-29 DIAGNOSIS — J810 Acute pulmonary edema: Secondary | ICD-10-CM

## 2015-05-29 DIAGNOSIS — F32A Depression, unspecified: Secondary | ICD-10-CM | POA: Diagnosis present

## 2015-05-29 DIAGNOSIS — J189 Pneumonia, unspecified organism: Secondary | ICD-10-CM | POA: Diagnosis present

## 2015-05-29 DIAGNOSIS — I272 Other secondary pulmonary hypertension: Secondary | ICD-10-CM

## 2015-05-29 DIAGNOSIS — F329 Major depressive disorder, single episode, unspecified: Secondary | ICD-10-CM

## 2015-05-29 DIAGNOSIS — E870 Hyperosmolality and hypernatremia: Secondary | ICD-10-CM | POA: Diagnosis present

## 2015-05-29 DIAGNOSIS — F411 Generalized anxiety disorder: Secondary | ICD-10-CM

## 2015-05-29 DIAGNOSIS — E785 Hyperlipidemia, unspecified: Secondary | ICD-10-CM | POA: Diagnosis present

## 2015-05-29 LAB — COMPREHENSIVE METABOLIC PANEL
ALBUMIN: 3.4 g/dL — AB (ref 3.5–5.0)
ALK PHOS: 57 U/L (ref 38–126)
ALT: 21 U/L (ref 14–54)
AST: 25 U/L (ref 15–41)
Anion gap: 11 (ref 5–15)
BILIRUBIN TOTAL: 0.8 mg/dL (ref 0.3–1.2)
BUN: 35 mg/dL — ABNORMAL HIGH (ref 6–20)
CHLORIDE: 106 mmol/L (ref 101–111)
CO2: 28 mmol/L (ref 22–32)
Calcium: 9.9 mg/dL (ref 8.9–10.3)
Creatinine, Ser: 0.54 mg/dL (ref 0.44–1.00)
GFR calc Af Amer: >60 mL/min (ref >60)
GFR calc non Af Amer: >60 mL/min (ref >60)
Glucose, Bld: 123 mg/dL — ABNORMAL HIGH (ref 65–99)
Potassium: 4.1 mmol/L (ref 3.5–5.1)
Sodium: 145 mmol/L (ref 135–145)
Total Protein: 7.5 g/dL (ref 6.5–8.1)

## 2015-05-29 LAB — LIPID PANEL
CHOL/HDL RATIO: 4.5 ratio
Cholesterol: 201 mg/dL — ABNORMAL HIGH (ref 0–200)
HDL: 45 mg/dL (ref >40)
LDL Cholesterol: 129 mg/dL — ABNORMAL HIGH (ref 0–99)
Triglycerides: 133 mg/dL (ref <150)
VLDL: 27 mg/dL (ref 0–40)

## 2015-05-29 LAB — CBC
HCT: 45 % (ref 36.0–46.0)
HEMOGLOBIN: 13.9 g/dL (ref 12.0–15.0)
MCH: 29.2 pg (ref 26.0–34.0)
MCHC: 30.9 g/dL (ref 30.0–36.0)
MCV: 94.5 fL (ref 78.0–100.0)
Platelets: 266 10*3/uL (ref 150–400)
RBC: 4.76 MIL/uL (ref 3.87–5.11)
RDW: 14 % (ref 11.5–15.5)
WBC: 13.2 10*3/uL — ABNORMAL HIGH (ref 4.0–10.5)

## 2015-05-29 MED ORDER — STARCH (THICKENING) PO POWD
ORAL | Status: DC | PRN
  Filled 2015-05-29 (×2): qty 227

## 2015-05-29 NOTE — Progress Notes (Signed)
Physical Therapy Treatment Patient Details Name: Julia Massey MRN: [id number] DOB: Sep 28, 1950 Today's Date: 05/29/2015    History of Present Illness Patient is a 65 y/o female with hx of HLD, anxiety, depression, and COPD presents with hypoxic to the point that she required BiPAP (was 73% on RA).CTA -revealed extensive bilateral patchy infiltrates c/w atypical PNA.  Intubated 1/5-1/12, re-intubated 1/12-1/16,     PT Comments    Pt admitted with above diagnosis. Pt currently with functional limitations due to balance and endurance deficits. Pt progressing but still with cognitive issues and difficulty ambulating due to balance and endurance deficits.  Will continue to progress as able.  Hopeful for Rehab soon.   Pt will benefit from skilled PT to increase their independence and safety with mobility to allow discharge to the venue listed below.    Follow Up Recommendations  CIR     Equipment Recommendations  Other (comment) (TBA)    Recommendations for Other Services OT consult;Rehab consult     Precautions / Restrictions Precautions Precautions: Fall Restrictions Weight Bearing Restrictions: No    Mobility  Bed Mobility Overal bed mobility: Needs Assistance Bed Mobility: Supine to Sit     Supine to sit: Min assist;HOB elevated     General bed mobility comments: Pt used rail and was able to get her LEs off bed without assist.   Transfers Overall transfer level: Needs assistance Equipment used: Rolling walker (2 wheeled) Transfers: Sit to/from Stand Sit to Stand: Min assist;Mod assist;From elevated surface         General transfer comment: Attempted to stand without device with pt reaching for PTs hands and very unsteady with posterior lean therefore pt sat back on bed and PT obtained RW to use.  Pt still needed mod assist to power up and mod assist to steady pt with pt standing on tiptoes.    Ambulation/Gait Ambulation/Gait assistance: Mod assist Ambulation  Distance (Feet): 30 Feet (10 feet then 20 feet) Assistive device: Rolling walker (2 wheeled) Gait Pattern/deviations: Step-to pattern;Decreased step length - right;Decreased step length - left;Shuffle;Ataxic;Leaning posteriorly;Staggering left;Staggering right;Narrow base of support   Gait velocity interpretation: Below normal speed for age/gender General Gait Details: Pt ambulated on tip toes initially and could bring heels to floor with cues. Pt very ataxic with gait with difficulty placing feet for ambulation.  Pt very unsteady needing mod assist for support with posterior lean as well.  Had to assist with manuvering RW as well as pt could not coordinate steps much less coordinate the RW.  Was able to ambulate with this asisst into bathroom and then back to recliner.  Gave pt washclothes to clean herself and she got them in toilet and appeared to have difficulty problem solving how to clean herself.  Uncontrolled descent into chair with PT having to assist.    Stairs            Wheelchair Mobility    Modified Rankin (Stroke Patients Only)       Balance Overall balance assessment: Needs assistance;History of Falls Sitting-balance support: No upper extremity supported;Feet supported Sitting balance-Leahy Scale: Fair Sitting balance - Comments: Able to sit unsupported.    Standing balance support: Bilateral upper extremity supported;During functional activity Standing balance-Leahy Scale: Poor Standing balance comment: RElies on UE support with posterior lean signficant.                     Cognition Arousal/Alertness: Awake/alert Behavior During Therapy: WFL for tasks assessed/performed Overall Cognitive  Status: Impaired/Different from baseline Area of Impairment: Safety/judgement;Problem solving;Awareness Orientation Level: Disoriented to;Time;Situation   Memory: Decreased short-term memory   Safety/Judgement: Decreased awareness of safety Awareness:  Emergent Problem Solving: Slow processing      Exercises General Exercises - Lower Extremity Ankle Circles/Pumps: AROM;Both;10 reps;Seated Long Arc Quad: AROM;Both;10 reps;Seated Hip Flexion/Marching: AROM;Both;10 reps;Seated    General Comments        Pertinent Vitals/Pain Pain Assessment: No/denies pain  VSS    Home Living                      Prior Function            PT Goals (current goals can now be found in the care plan section) Progress towards PT goals: Progressing toward goals    Frequency  Min 3X/week    PT Plan Current plan remains appropriate    Co-evaluation             End of Session Equipment Utilized During Treatment: Gait belt Activity Tolerance: Patient tolerated treatment well Patient left: in chair;with call bell/phone within reach;with chair alarm set     Time: 4235-3614 PT Time Calculation (min) (ACUTE ONLY): 18 min  Charges:  $Gait Training: 8-22 mins                    G CodesBerline Lopes 06/15/2015, 1:53 PM [employer] Acute Rehabilitation [phone] (pager)

## 2015-05-29 NOTE — Care Management Note (Signed)
Case Management Note  Patient Details  Name: Julia Massey MRN: [id number] Date of Birth: [birth year]  Subjective/Objective:   Pt's plan is transfer to inpatient rehab when insurance authorizes, CSW has given pt bed offers if ST-SNF is needed.  Dtr, Efraim Kaufmann, [phone], will be available to provide 24/7 assistance to pt when she completes rehab.                              Expected Discharge Plan:  IP Rehab Facility  In-House Referral:  Clinical Social Work  Discharge planning Services  CM Consult  Gaige Sebo, [place], [place] 05/29/2015, 1:40 PM

## 2015-05-29 NOTE — Clinical Social Work Placement (Signed)
   CLINICAL SOCIAL WORK PLACEMENT  NOTE  Date:  05/29/2015  Patient Details  Name: Julia Massey MRN: [id number] Date of Birth: 04-25-1951  Clinical Social Work is seeking post-discharge placement for this patient at the Skilled  Nursing Facility level of care (*CSW will initial, date and re-position this form in  chart as items are completed):  No   Patient/family provided with [facility] Clinical Social Work Department's list of facilities offering this level of care within the geographic area requested by the patient (or if unable, by the patient's family).  No   Patient/family informed of their freedom to choose among providers that offer the needed level of care, that participate in Medicare, Medicaid or managed care program needed by the patient, have an available bed and are willing to accept the patient.  No   Patient/family informed of [facility]'s ownership interest in [facility] and [facility], as well as of the fact that they are under no obligation to receive care at these facilities.  PASRR submitted to EDS on 05/28/15     PASRR number received on 05/29/15     Existing PASRR number confirmed on       FL2 transmitted to all facilities in geographic area requested by pt/family on 05/28/15     FL2 transmitted to all facilities within larger geographic area on       Patient informed that his/her managed care company has contracts with or will negotiate with certain facilities, including the following:            Patient/family informed of bed offers received.  Patient chooses bed at       Physician recommends and patient chooses bed at      Patient to be transferred to   on  .  Patient to be transferred to facility by       Patient family notified on   of transfer.  Name of family member notified:        PHYSICIAN Please sign FL2     Additional Comment:    _______________________________________________ Izora Ribas, LCSW 05/29/2015, 2:29  PM

## 2015-05-29 NOTE — Progress Notes (Addendum)
Nutrition Follow-up  DOCUMENTATION CODES:   Non-severe (moderate) malnutrition in context of chronic illness  INTERVENTION:   Magic cup TID with meals, each supplement provides 290 kcal and 9 grams of protein  NEW NUTRITION DIAGNOSIS:   Predicted suboptimal nutrient intake related to dysphagia as evidenced by  (objective swallow evaluation), ongoing   GOAL:   Patient will meet greater than or equal to 90% of their needs, currently unmet  MONITOR:   PO intake, Supplement acceptance, Labs, Weight trends, I & O's  ASSESSMENT:   65 y.o. female with PMH of HLD, anxiety, depression, and COPD (never had formal PFT's, was only told she had COPD per CXR report). She began to feel sick around christmas time 2016 with cough productive of thick green sputum and fevers (Tmax 102) / chills. She saw her PCP who placed her on a z- pack which she completed 2 days ago. She apparently was initially feeling somewhat better, then began to have worsening SOB 1 day ago.  Pt extubated 1/16.  TF discontinued.  S/p objective swallow evaluation 1/18.  Pt with moderate pharyngeal phase dysphagia. Advanced to Dys 2, nectar thick liquid diet.  Predict intake to be suboptimal.  Will add oral nutrition supplements.  Plan is for pt to transfer to IP Rehab once insurance authorizes.  Diet Order:  DIET DYS 2 Room service appropriate?: Yes; Fluid consistency:: Nectar Thick  Skin:  Reviewed, no issues  Last BM:  1/16  Height:   Ht Readings from Last 1 Encounters:  05/27/15 5' (1.524 m)    Weight:   Wt Readings from Last 1 Encounters:  05/29/15 104 lb 4.4 oz (47.3 kg)    Ideal Body Weight:  45.5 kg  BMI:  Body mass index is 20.37 kg/(m^2).  Estimated Nutritional Needs:   Kcal:  1400-1600  Protein:  75-85 gm  Fluid:  >/= 1.5 L  EDUCATION NEEDS:   No education needs identified at this time  Maureen Chatters, RD, LDN Pager #: [phone] After-Hours Pager #: [phone]

## 2015-05-29 NOTE — Progress Notes (Signed)
Inpatient Rehabilitation  I met with the patient at the bedside to discuss recommendation of IP Rehab.  I answered pt's questions and provided her with informational booklets.  She is in agreement that she needs IP Rehab.  Insurance authorization process has been initiated and I await their decision.  I discussed with [facility], RNCM.  Please call if questions.  [facility] Admissions Coordinator Cell [phone] Office [phone]

## 2015-05-29 NOTE — Progress Notes (Signed)
Pt transferred to 6E, daughter made aware of this via phone. Pt stable and in good sprits about getting better and working hard to get home.

## 2015-05-29 NOTE — Progress Notes (Signed)
Bed offers given to pt- no choice at this time  CSW will continue to follow  Merlyn Lot, [facility] Clinical Social Worker [phone]

## 2015-05-29 NOTE — Progress Notes (Signed)
[facility] TEAM 1 - Stepdown/ICU TEAM Progress Note  JENNALYN CAWLEY CBJ:[id number] DOB: June 27, 1950 DOA: 05/14/2015 PCP: No primary care provider on file.  Admit HPI / Brief Narrative: 65 y.o. WF PMHx Anxiety, Depression, and COPD HLD,   Began to feel sick around Christmas with cough productive of thick green sputum and fevers (Tmax 102) / chills. She saw her PCP who placed her on a z- pack. She apparently was initially feeling somewhat better, then began to have worsening SOB again. She continued to have cough productive of thick green sputum and had some mild chest pain that was mostly associated with coughing.   On 1/04 she went to [facility] where she was found to be hypoxic to the point that she required BiPAP (was 73% on RA). Trial off BiPAP was attempted, but she failed. CTA was obtained which revealed extensive bilateral patchy infiltrates c/w atypical PNA. Additional labs noteable for WBC 20.8 (85% neut's), K 3.2, BNP 1252, Trop 0.039, lactic acid 2.5. Flu PCR was negative.  HPI/Subjective: 1/19  A/O 4, NAD. Negative CP, negative SOB, negative N/V  Assessment/Plan: Atypical PNA vs post viral pneumonitis > ARDS w/ acute resp failure  -Resolved. Maintaining adequate SPO2 on room air   Acute pulmonary edema - Elevated BNP -pulmonary edema secondary to IVF received in ICU - diuretics now discontinued  - appears euvolemic  Diastolic CHF/Pulm HTN -pa 52 > due to hypoxemia  - follow up as outpatient with stabilization of respiratory status -Strict in and out since admission -3.6 L -Daily weight  Nutrition -Patient passed MBS on 1/18  -Recommend dysphagia 2 nectar thick diet; Chin tuck with all PO Consumption. Pt requires full supervision/cues during meals   HLD -MAR patient not on medication  -Obtain lipid panel   Hypernatremia  Resolved   Metabolic alkolosis from diuresis -Resolved    ?Relative adrenal insufficiency -Cortisol was not checked  - now off steroid - no indication for ongoing adrenal issues   Anxiety / Depression -Prozac 40 mg daily  Acute encephalopathy  - resolved  Deconditioned  -Awaiting insurance preauthorization for CIR stay     Code Status: FULL Family Communication: no family present at time of exam Disposition Plan: CIR awaiting insurance approval    Consultants: Dr.Murali Ramaswamy PCCM   Procedure/Significant Events: 1/04 admit with acute hypoxic respiratory failure and concern for atypical PNA 1/05 distress on bipap > intubated, bronchoscopy, a-line and central line placed 1/5 TTE - EF 55-60% - no WMA - grade 1 DD - severely reduced RV systolic fxn - PA presure 49mm Hg 1/16 extubated  1/18 [facility] assumed care    Culture   Antibiotics: Vanc 01/04 >1/7 Zosyn 01/04 >1/7 Azithro 01/04 >1/5 Levofloxacin 1/5 > 1/12   DVT prophylaxis: Subcutaneous heparin   Devices    LINES / TUBES:      Continuous Infusions: . dextrose 5 % and 0.45 % NaCl with KCl 40 mEq/L 50 mL/hr at 05/28/15 2022    Objective: VITAL SIGNS: Temp: 97.8 F (36.6 C) (01/19 0800) Temp Source: Oral (01/19 0800) BP: 128/65 mmHg (01/19 0800) Pulse Rate: 104 (01/19 0800) SPO2; FIO2:   Intake/Output Summary (Last 24 hours) at 05/29/15 1054 Last data filed at 05/29/15 0500  Gross per 24 hour  Intake    950 ml  Output    300 ml  Net    650 ml     Exam: General: A/O 4, NAD, No acute respiratory distress Eyes: Negative headache, double vision,negative scleral  hemorrhage ENT: Negative Runny nose, negative gingival bleeding, Neck:  Negative scars, masses, torticollis, lymphadenopathy, JVD Lungs: Clear to auscultation bilaterally without wheezes or crackles Cardiovascular: Regular rate and rhythm without murmur gallop or rub normal S1 and S2 Abdomen:negative abdominal pain, nondistended, positive soft, bowel sounds, no rebound, no ascites, no appreciable mass Extremities: No significant cyanosis,  clubbing, or edema bilateral lower extremities Psychiatric:  Negative depression, negative anxiety, negative fatigue, negative mania  Neurologic:  Cranial nerves II through XII intact, tongue/uvula midline, all extremities muscle strength 5/5, sensation intact throughout, negative dysarthria, negative expressive aphasia, negative receptive aphasia.   Data Reviewed: Basic Metabolic Panel:  Recent Labs Lab 05/23/15 1020 05/23/15 1907  05/24/15 0830 05/24/15 1827 05/25/15 0514 05/25/15 0725 05/26/15 0500 05/27/15 0312 05/28/15 0718 05/29/15 0240  NA  --   --   < >  --   --  139  --  145 144 143 145  K  --   --   < >  --   --  3.9  --  3.1* 3.2* 4.1 4.1  CL  --   --   < >  --   --  89*  --  96* 95* 102 106  CO2  --   --   < >  --   --  39*  --  40* 33* 31 28  GLUCOSE  --   --   < >  --   --  237*  --  159* 119* 132* 123*  BUN  --   --   < >  --   --  51*  --  54* 62* 58* 35*  CREATININE  --   --   < >  --   --  0.57  --  0.50 0.50 0.63 0.54  CALCIUM  --   --   < >  --   --  9.5  --  9.5 10.3 10.2 9.9  MG 2.1 2.0  --  2.0 2.1  --  2.3 2.4 2.8*  --   --   PHOS 3.7 3.3  --   --   --   --  4.4 3.9 5.0*  --   --   < > = values in this interval not displayed. Liver Function Tests:  Recent Labs Lab 05/29/15 0240  AST 25  ALT 21  ALKPHOS 57  BILITOT 0.8  PROT 7.5  ALBUMIN 3.4*   No results for input(s): LIPASE, AMYLASE in the last 168 hours.  Recent Labs Lab 05/23/15 1108  AMMONIA 16   CBC:  Recent Labs Lab 05/23/15 0310  05/25/15 0514 05/26/15 0500 05/27/15 0312 05/28/15 0718 05/29/15 0240  WBC 21.6*  < > 14.7* 14.9* 17.6* 15.1* 13.2*  NEUTROABS 19.6*  --   --   --   --   --   --   HGB 11.0*  < > 12.2 11.7* 13.0 14.4 13.9  HCT 35.1*  < > 38.6 38.8 42.6 45.5 45.0  MCV 93.9  < > 94.6 95.8 96.6 94.8 94.5  PLT 134*  < > 106* 128* 170 220 266  < > = values in this interval not displayed. Cardiac Enzymes: No results for input(s): CKTOTAL, CKMB, CKMBINDEX, TROPONINI  in the last 168 hours. BNP (last 3 results) No results for input(s): BNP in the last 8760 hours.  ProBNP (last 3 results) No results for input(s): PROBNP in the last 8760 hours.  CBG:  Recent Labs Lab 05/28/15 0017 05/28/15 7035  05/28/15 0805 05/28/15 1156 05/28/15 1627  GLUCAP 110* 112* 119* 138* 153*    Recent Results (from the past 240 hour(s))  Culture, respiratory (NON-Expectorated)     Status: None   Collection Time: 05/21/15 11:26 AM  Result Value Ref Range Status   Specimen Description TRACHEAL ASPIRATE  Final   Special Requests Normal  Final   Gram Stain   Final    FEW WBC PRESENT,BOTH PMN AND MONONUCLEAR RARE SQUAMOUS EPITHELIAL CELLS PRESENT RARE YEAST Performed at [facility]    Culture   Final    MODERATE YEAST CONSISTENT WITH CANDIDA SPECIES Performed at [facility]    Report Status 05/24/2015 FINAL  Final  Culture, blood (Routine X 2) w Reflex to ID Panel     Status: None   Collection Time: 05/21/15 12:25 PM  Result Value Ref Range Status   Specimen Description BLOOD RIGHT ARM  Final   Special Requests BOTTLES DRAWN AEROBIC AND ANAEROBIC 5CC  Final   Culture NO GROWTH 5 DAYS  Final   Report Status 05/26/2015 FINAL  Final  Culture, blood (Routine X 2) w Reflex to ID Panel     Status: None   Collection Time: 05/21/15 12:33 PM  Result Value Ref Range Status   Specimen Description BLOOD RIGHT HAND  Final   Special Requests IN PEDIATRIC BOTTLE 3CC  Final   Culture NO GROWTH 5 DAYS  Final   Report Status 05/26/2015 FINAL  Final     Studies:  Recent x-ray studies have been reviewed in detail by the Attending Physician  Scheduled Meds:  Scheduled Meds: . antiseptic oral rinse  7 mL Mouth Rinse BID  . FLUoxetine  40 mg Oral Daily  . folic acid  1 mg Oral Daily  . heparin subcutaneous  5,000 Units Subcutaneous 3 times per day  . multivitamin with minerals  1 tablet Oral Daily  . polyethylene glycol  17 g Oral Daily  . thiamine   100 mg Oral Daily    Time spent on care of this patient: 40 mins   Hanh Kertesz, Roselind Messier , MD  [facility] Office  [phone] Pager [phone]  On-Call/Text Page:      [email or web address]      password [email or web address]  If 7PM-7AM, please contact night-coverage [email or web address] Password [email or web address] 05/29/2015, 10:54 AM   LOS: 15 days   Care during the described time interval was provided by me .  I have reviewed this patient's available data, including medical history, events of note, physical examination, and all test results as part of my evaluation. I have personally reviewed and interpreted all radiology studies.   Carolyne Littles, MD [phone] Pager

## 2015-05-29 NOTE — Clinical Social Work Note (Addendum)
Clinical Social Work Assessment  Patient Details  Name: Julia Massey MRN: [id number] Date of Birth: [birth year]  Date of referral:  05/29/15               Reason for consult:  Facility Placement                Permission sought to share information with:  Facility Industrial/product designer granted to share information::  Yes, Verbal Permission Granted  Name::        Agency::  SNFs  Relationship::     Contact Information:     Housing/Transportation Living arrangements for the past 2 months:  Single Family Home Source of Information:  Patient Patient Interpreter Needed:  None Criminal Activity/Legal Involvement Pertinent to Current Situation/Hospitalization:  No - Comment as needed Significant Relationships:  Dependent Children, Spouse Lives with:  Spouse, Minor Children Do you feel safe going back to the place where you live?  No Need for family participation in patient care:  Yes (Comment)  Care giving concerns:  Pt lives at home with 63 year old daughter, according to report pt husband is currently in jail and would not be at home to assist   Social Worker assessment / plan:  CSW spoke with pt concerning PT recommendation for rehab when ready to leave the hospital.  CSW explained that if CIR was unable to admit next level of care would be SNF- explained differences between those two programs.    Employment status:  Retired Network engineer Care PT Recommendations:  Inpatient Rehab Consult Information / Referral to community resources:  Skilled Nursing Facility  Patient/Family's Response to care:  Pt is agreeable to SNF placement if CIR cannot admit but expresses a preference for CIR  Patient/Family's Understanding of and Emotional Response to Diagnosis, Current Treatment, and Prognosis:  No questions or concerns at this time.  Emotional Assessment Appearance:  Appears stated age Attitude/Demeanor/Rapport:    Affect (typically observed):   Appropriate, Accepting Orientation:  Oriented to Self, Oriented to Place, Oriented to  Time, Oriented to Situation Alcohol / Substance use:  Not Applicable Psych involvement (Current and /or in the community):  No (Comment)  Discharge Needs  Concerns to be addressed:  Care Coordination Readmission within the last 30 days:  No Current discharge risk:  Physical Impairment, Lack of support system Barriers to Discharge:  Continued Medical Work up   [employer], LCSW 05/29/2015, 2:23 PM

## 2015-05-30 ENCOUNTER — Encounter (HOSPITAL_COMMUNITY): Payer: Self-pay | Admitting: *Deleted

## 2015-05-30 ENCOUNTER — Inpatient Hospital Stay (HOSPITAL_COMMUNITY)
Admission: AD | Admit: 2015-05-30 | Discharge: 2015-06-12 | DRG: 947 | Disposition: A | Discharge status: Home / Self Care | Payer: BLUE CROSS/BLUE SHIELD | Source: Intra-hospital | Attending: Physical Medicine & Rehabilitation | Admitting: Physical Medicine & Rehabilitation

## 2015-05-30 DIAGNOSIS — R0789 Other chest pain: Secondary | ICD-10-CM | POA: Diagnosis present

## 2015-05-30 DIAGNOSIS — I5032 Chronic diastolic (congestive) heart failure: Secondary | ICD-10-CM | POA: Diagnosis not present

## 2015-05-30 DIAGNOSIS — Z79899 Other long term (current) drug therapy: Secondary | ICD-10-CM

## 2015-05-30 DIAGNOSIS — R5381 Other malaise: Secondary | ICD-10-CM | POA: Diagnosis not present

## 2015-05-30 DIAGNOSIS — F411 Generalized anxiety disorder: Secondary | ICD-10-CM | POA: Diagnosis present

## 2015-05-30 DIAGNOSIS — R531 Weakness: Secondary | ICD-10-CM | POA: Diagnosis present

## 2015-05-30 DIAGNOSIS — K5900 Constipation, unspecified: Secondary | ICD-10-CM | POA: Diagnosis present

## 2015-05-30 DIAGNOSIS — F17200 Nicotine dependence, unspecified, uncomplicated: Secondary | ICD-10-CM | POA: Diagnosis present

## 2015-05-30 DIAGNOSIS — J441 Chronic obstructive pulmonary disease with (acute) exacerbation: Secondary | ICD-10-CM | POA: Diagnosis present

## 2015-05-30 DIAGNOSIS — J449 Chronic obstructive pulmonary disease, unspecified: Secondary | ICD-10-CM | POA: Diagnosis present

## 2015-05-30 DIAGNOSIS — G9340 Encephalopathy, unspecified: Secondary | ICD-10-CM | POA: Diagnosis present

## 2015-05-30 DIAGNOSIS — R1310 Dysphagia, unspecified: Secondary | ICD-10-CM | POA: Diagnosis present

## 2015-05-30 DIAGNOSIS — F418 Other specified anxiety disorders: Secondary | ICD-10-CM | POA: Diagnosis present

## 2015-05-30 DIAGNOSIS — F419 Anxiety disorder, unspecified: Secondary | ICD-10-CM | POA: Diagnosis present

## 2015-05-30 LAB — BASIC METABOLIC PANEL
ANION GAP: 12 (ref 5–15)
BUN: 22 mg/dL — ABNORMAL HIGH (ref 6–20)
CALCIUM: 9.6 mg/dL (ref 8.9–10.3)
CHLORIDE: 106 mmol/L (ref 101–111)
CO2: 23 mmol/L (ref 22–32)
CREATININE: 0.57 mg/dL (ref 0.44–1.00)
GFR calc non Af Amer: >60 mL/min (ref >60)
Glucose, Bld: 115 mg/dL — ABNORMAL HIGH (ref 65–99)
Potassium: 4.2 mmol/L (ref 3.5–5.1)
SODIUM: 141 mmol/L (ref 135–145)

## 2015-05-30 LAB — COMPREHENSIVE METABOLIC PANEL
ALT: 34 U/L (ref 14–54)
AST: 35 U/L (ref 15–41)
Albumin: 3.1 g/dL — ABNORMAL LOW (ref 3.5–5.0)
Alkaline Phosphatase: 61 U/L (ref 38–126)
Anion gap: 15 (ref 5–15)
BUN: 18 mg/dL (ref 6–20)
CALCIUM: 9.4 mg/dL (ref 8.9–10.3)
CHLORIDE: 102 mmol/L (ref 101–111)
CO2: 21 mmol/L — ABNORMAL LOW (ref 22–32)
CREATININE: 0.6 mg/dL (ref 0.44–1.00)
GFR calc Af Amer: >60 mL/min (ref >60)
Glucose, Bld: 152 mg/dL — ABNORMAL HIGH (ref 65–99)
Potassium: 4.3 mmol/L (ref 3.5–5.1)
SODIUM: 138 mmol/L (ref 135–145)
TOTAL PROTEIN: 6.8 g/dL (ref 6.5–8.1)
Total Bilirubin: 1.2 mg/dL (ref 0.3–1.2)

## 2015-05-30 LAB — CBC WITH DIFFERENTIAL/PLATELET
BASOS ABS: 0.1 10*3/uL (ref 0.0–0.1)
BASOS PCT: 1 %
BASOS PCT: 0 %
Basophils Absolute: 0.1 10*3/uL (ref 0.0–0.1)
EOS ABS: 0.9 10*3/uL — AB (ref 0.0–0.7)
EOS ABS: 0.7 10*3/uL (ref 0.0–0.7)
EOS PCT: 6 %
Eosinophils Relative: 7 %
HCT: 42.8 % (ref 36.0–46.0)
HCT: 41.3 % (ref 36.0–46.0)
HEMOGLOBIN: 13.7 g/dL (ref 12.0–15.0)
Hemoglobin: 13.2 g/dL (ref 12.0–15.0)
LYMPHS PCT: 20 %
Lymphocytes Relative: 22 %
Lymphs Abs: 2.9 10*3/uL (ref 0.7–4.0)
Lymphs Abs: 2.7 10*3/uL (ref 0.7–4.0)
MCH: 29.7 pg (ref 26.0–34.0)
MCH: 30 pg (ref 26.0–34.0)
MCHC: 32 g/dL (ref 30.0–36.0)
MCHC: 32 g/dL (ref 30.0–36.0)
MCV: 92.8 fL (ref 78.0–100.0)
MCV: 93.9 fL (ref 78.0–100.0)
MONO ABS: 0.8 10*3/uL (ref 0.1–1.0)
Monocytes Absolute: 1 10*3/uL (ref 0.1–1.0)
Monocytes Relative: 7 %
Monocytes Relative: 6 %
NEUTROS PCT: 63 %
Neutro Abs: 8.6 10*3/uL — ABNORMAL HIGH (ref 1.7–7.7)
Neutro Abs: 8.9 10*3/uL — ABNORMAL HIGH (ref 1.7–7.7)
Neutrophils Relative %: 68 %
PLATELETS: 245 10*3/uL (ref 150–400)
Platelets: 284 10*3/uL (ref 150–400)
RBC: 4.61 MIL/uL (ref 3.87–5.11)
RBC: 4.4 MIL/uL (ref 3.87–5.11)
RDW: 14 % (ref 11.5–15.5)
RDW: 13.9 % (ref 11.5–15.5)
WBC: 13.6 10*3/uL — AB (ref 4.0–10.5)
WBC: 13.2 10*3/uL — AB (ref 4.0–10.5)

## 2015-05-30 LAB — MAGNESIUM: MAGNESIUM: 2.1 mg/dL (ref 1.7–2.4)

## 2015-05-30 MED ORDER — ADULT MULTIVITAMIN W/MINERALS CH
1.0000 | ORAL_TABLET | Freq: Every day | ORAL | Status: DC
  Administered 2015-05-31 – 2015-06-12 (×13): 1 via ORAL
  Filled 2015-05-30 (×14): qty 1

## 2015-05-30 MED ORDER — ACETAMINOPHEN 160 MG/5ML PO SOLN
650.0000 mg | Freq: Four times a day (QID) | ORAL | Status: DC | PRN
  Administered 2015-05-30 – 2015-06-05 (×3): 650 mg via ORAL
  Filled 2015-05-30 (×3): qty 20.3

## 2015-05-30 MED ORDER — HEPARIN SODIUM (PORCINE) 5000 UNIT/ML IJ SOLN
5000.0000 [IU] | Freq: Three times a day (TID) | INTRAMUSCULAR | Status: DC
Start: 2015-05-30 — End: 2015-06-12
  Administered 2015-05-30 – 2015-06-11 (×35): 5000 [IU] via SUBCUTANEOUS
  Filled 2015-05-30 (×34): qty 1

## 2015-05-30 MED ORDER — POLYETHYLENE GLYCOL 3350 17 G PO PACK
17.0000 g | PACK | Freq: Every day | ORAL | Status: DC
  Administered 2015-05-31 – 2015-06-12 (×8): 17 g via ORAL
  Filled 2015-05-30 (×14): qty 1

## 2015-05-30 MED ORDER — HEPARIN SODIUM (PORCINE) 5000 UNIT/ML IJ SOLN: 5000.0000 [IU] | Freq: Three times a day (TID) | INTRAMUSCULAR | Status: DC

## 2015-05-30 MED ORDER — ONDANSETRON HCL 4 MG PO TABS
4.0000 mg | ORAL_TABLET | Freq: Four times a day (QID) | ORAL | Status: DC | PRN
Start: 2015-05-30 — End: 2015-06-12

## 2015-05-30 MED ORDER — BISACODYL 10 MG RE SUPP: 10.0000 mg | Freq: Every day | RECTAL | Status: DC | PRN

## 2015-05-30 MED ORDER — VITAMIN B-1 100 MG PO TABS
100.0000 mg | ORAL_TABLET | Freq: Every day | ORAL | Status: DC
Start: 2015-05-31 — End: 2015-06-12
  Administered 2015-05-31 – 2015-06-12 (×13): 100 mg via ORAL
  Filled 2015-05-30 (×14): qty 1

## 2015-05-30 MED ORDER — SORBITOL 70 % SOLN: 30.0000 mL | Freq: Every day | Status: DC | PRN

## 2015-05-30 MED ORDER — FLUOXETINE HCL 20 MG PO CAPS
40.0000 mg | ORAL_CAPSULE | Freq: Every day | ORAL | Status: DC
  Administered 2015-05-31 – 2015-06-12 (×13): 40 mg via ORAL
  Filled 2015-05-30 (×14): qty 2

## 2015-05-30 MED ORDER — ONDANSETRON HCL 4 MG/2ML IJ SOLN: 4.0000 mg | Freq: Four times a day (QID) | INTRAMUSCULAR | Status: DC | PRN

## 2015-05-30 MED ORDER — IPRATROPIUM-ALBUTEROL 0.5-2.5 (3) MG/3ML IN SOLN: 3.0000 mL | RESPIRATORY_TRACT | Status: DC | PRN

## 2015-05-30 MED ORDER — FOLIC ACID 1 MG PO TABS
1.0000 mg | ORAL_TABLET | Freq: Every day | ORAL | Status: DC
Start: 2015-05-31 — End: 2015-06-12
  Administered 2015-05-31 – 2015-06-12 (×13): 1 mg via ORAL
  Filled 2015-05-30 (×14): qty 1

## 2015-05-30 MED ORDER — ENSURE ENLIVE PO LIQD
237.0000 mL | Freq: Two times a day (BID) | ORAL | Status: DC
  Administered 2015-05-31 – 2015-06-12 (×15): 237 mL via ORAL

## 2015-05-30 MED ORDER — STARCH (THICKENING) PO POWD
ORAL | Status: DC | PRN
  Filled 2015-05-30: qty 227

## 2015-05-30 NOTE — Progress Notes (Addendum)
Patient has been transferred to 4W06. Patient had all her belongings with her. Family notified of transfer.

## 2015-05-30 NOTE — Progress Notes (Signed)
[facility] Admission Coordinator Signed Physical Medicine and Rehabilitation PMR Pre-admission 05/30/2015 12:19 PM  Related encounter: Admission (Current) from 05/14/2015 in [facility] 6E MEDICAL/RENAL    Expand All Collapse All   PMR Admission Coordinator Pre-Admission Assessment  Patient: Julia Massey is an 65 y.o., female MRN: [id number] DOB: Apr 01, 1951 Height: 5' (152.4 cm) Weight: 47.5 kg (104 lb 11.5 oz)  Insurance Information HMO: PPO: yes PCP: IPA: 80/20: OTHER:  PRIMARY: [employer] [place] Policy#: [id number] Subscriber: self CM Name: Christie Beckers Phone#: [phone] Fax#: [phone] Pre-Cert#: [id number] , pt. approved through 06/11/2015 with clinical updates due on 06/09/15 to Clydie Braun Employer:  Benefits: Phone #: [phone] Name: Lacretia Nicks. Date: 05/11/15 Deduct: $1000 Out of Pocket Max: $2350 Life Max: n/a CIR: 70%/30% SNF: 70%/30% Outpatient: 70% Co-Pay: 30% Home Health: 70% Co-Pay: 30% DME: 70% Co-Pay: 30% Providers: In network SECONDARY: Policy#: Subscriber:  CM Name: Phone#: Fax#:  Pre-Cert#: Employer:  Benefits: Phone #: Name:  Eff. Date: Deduct: Out of Pocket Max: Life Max:  CIR: SNF:  Outpatient: Co-Pay:  Home Health: Co-Pay:  DME: Co-Pay:   Medicaid Application Date: Case Manager:  Disability Application Date: Case Worker:   Emergency Contact Information Contact Information    Name Relation Home Work Mobile   DALMA, PANCHAL  [phone]     Andria Rhein Daughter [phone]       Current Medical History  Patient Admitting Diagnosis: debility and encephalopathy due to multiple  medical issues above History of Present Illness: Julia Massey is a 64 y.o. right handed female with history of tobacco abuse ,COPD . Patient lives with spouse independent prior to admission. 2 level home with bedroom upstairs. Presented 05/15/2015 with productive cough low-grade fever and chills. Recently placed on a Z-Pak by her PCP. Found to be hypoxic 73% on room air. CT of the chest revealed extensive bilateral patchy infiltrates atypical pneumonia. No evidence of pulmonary emboli. White blood cell count 20,800, lactic acid 2.5. Potassium 3.2. Placed on broad-spectrum antibiotic. Patient required intubation. Echocardiogram with ejection fraction of 60% grade 1 diastolic dysfunction. Blood cultures no growth. Bouts of altered mental status. EEG negative for seizure. Cranial CT scan negative. Extubated 05/26/2015 and follow per critical care. Subcutaneous heparin for DVT prophylaxis. Swallow study completed 05/28/2015 and diet advanced to dysphagia #2 nectar thick liquids. . Physical therapy evaluation completed 05/27/2015 with recommendations of physical medicine rehabilitation consult. Patient was admitted for comprehensive rehabilitation program.       Past Medical History  No past medical history on file.  Family History  family history is not on file.  Prior Rehab/Hospitalizations:  Has the patient had major surgery during 100 days prior to admission? No  Current Medications   Current facility-administered medications:  . acetaminophen (TYLENOL) solution 650 mg, 650 mg, Oral, Q6H PRN, Lonia Blood, MD . antiseptic oral rinse (CPC / CETYLPYRIDINIUM CHLORIDE 0.05%) solution 7 mL, 7 mL, Mouth Rinse, BID, Leslye Peer, MD, 7 mL at 05/30/15 1040 . bisacodyl (DULCOLAX) suppository 10 mg, 10 mg, Rectal, Daily PRN, Lupita Leash, MD, 10 mg at 05/22/15 1645 . dextrose 5 % and 0.45 % NaCl with KCl 40 mEq/L infusion, , Intravenous, Continuous, Merwyn Katos, MD, Last Rate: 50  mL/hr at 05/30/15 1225 . FLUoxetine (PROZAC) capsule 40 mg, 40 mg, Oral, Daily, Belinda Fisher Stone, RPH, 40 mg at 05/30/15 1034 . folic acid (FOLVITE) tablet 1 mg, 1 mg, Oral, Daily, Lonia Blood, MD, 1 mg at 05/30/15 1034 .  food thickener (THICK IT) powder, , Oral, PRN, Drema Dallas, MD . heparin injection 5,000 Units, 5,000 Units, Subcutaneous, 3 times per day, Fuller Plan, MD, 5,000 Units at 05/30/15 0531 . ipratropium-albuterol (DUONEB) 0.5-2.5 (3) MG/3ML nebulizer solution 3 mL, 3 mL, Nebulization, Q4H PRN, Cyril Mourning V, MD . multivitamin with minerals tablet 1 tablet, 1 tablet, Oral, Daily, Darreld Mclean, MD, 1 tablet at 05/30/15 1034 . polyethylene glycol (MIRALAX / GLYCOLAX) packet 17 g, 17 g, Oral, Daily, Lupita Leash, MD, 17 g at 05/30/15 1033 . thiamine (VITAMIN B-1) tablet 100 mg, 100 mg, Oral, Daily, Lonia Blood, MD, 100 mg at 05/30/15 1033  Patients Current Diet: DIET DYS 2 Room service appropriate?: Yes; Fluid consistency:: Nectar Thick  Precautions / Restrictions Precautions Precautions: Fall Restrictions Weight Bearing Restrictions: No   Has the patient had 2 or more falls or a fall with injury in the past year?No  Prior Activity Level Community (5-7x/wk): Pt. reports being independent PTA and out of her home most days. Pt. drives and enjoys going Engineer, maintenance (IT) / Equipment Home Assistive Devices/Equipment: None Home Equipment: None  Prior Device Use: Indicate devices/aids used by the patient prior to current illness, exacerbation or injury? None of the above  Prior Functional Level Prior Function Level of Independence: Independent Comments: Pt confused at times and difficulty getting accurate PLOF/history.  Self Care: Did the patient need help bathing, dressing, using the toilet or eating? Independent  Indoor Mobility: Did the patient need assistance with walking from room to room (with or without device)?  Independent  Stairs: Did the patient need assistance with internal or external stairs (with or without device)? Independent  Functional Cognition: Did the patient need help planning regular tasks such as shopping or remembering to take medications? Independent  Current Functional Level Cognition  Overall Cognitive Status: Impaired/Different from baseline Orientation Level: Oriented to person, Oriented to place, Oriented to time, Disoriented to situation Safety/Judgement: Decreased awareness of safety General Comments: While performing tasks, pt somewhat antsy and trying to sit "[place] style" while therapist assisting with grooming sitting EOB.    Extremity Assessment (includes Sensation/Coordination)  Upper Extremity Assessment: Generalized weakness  Lower Extremity Assessment: Generalized weakness    ADLs  Overall ADL's : Needs assistance/impaired Eating/Feeding: Set up, Sitting Grooming: Minimal assistance, Sitting Grooming Details (indicate cue type and reason): EOB, pt also trying to sit "[place] style" in bed  Upper Body Bathing: Minimal assitance, Sitting Lower Body Bathing: Moderate assistance, Sit to/from stand Upper Body Dressing : Minimal assistance, Sitting Lower Body Dressing: Moderate assistance, Sit to/from stand Toilet Transfer: Minimal assistance, Tax adviser Details (indicate cue type and reason): simulated EOB to recliner transfer  Toileting - Clothing Manipulation Details (indicate cue type and reason): did not occur Tub/Shower Transfer Details (indicate cue type and reason): did not occur Functional mobility during ADLs: Minimal assistance, Cueing for safety, Cueing for sequencing General ADL Comments: Pt motivated to regain independence    Mobility  Overal bed mobility: Needs Assistance Bed Mobility: Supine to Sit Supine to sit: Min assist, HOB elevated General bed mobility comments: Pt used rail and was able to get her LEs off  bed without assist.     Transfers  Overall transfer level: Needs assistance Equipment used: Rolling walker (2 wheeled) Transfers: Sit to/from Stand Sit to Stand: Min assist, Mod assist, From elevated surface Stand pivot transfers: Min assist General transfer comment: Attempted to stand without device with pt reaching for PTs  hands and very unsteady with posterior lean therefore pt sat back on bed and PT obtained RW to use. Pt still needed mod assist to power up and mod assist to steady pt with pt standing on tiptoes.     Ambulation / Gait / Stairs / Wheelchair Mobility  Ambulation/Gait Ambulation/Gait assistance: Mod assist Ambulation Distance (Feet): 30 Feet (10 feet then 20 feet) Assistive device: Rolling walker (2 wheeled) Gait Pattern/deviations: Step-to pattern, Decreased step length - right, Decreased step length - left, Shuffle, Ataxic, Leaning posteriorly, Staggering left, Staggering right, Narrow base of support General Gait Details: Pt ambulated on tip toes initially and could bring heels to floor with cues. Pt very ataxic with gait with difficulty placing feet for ambulation. Pt very unsteady needing mod assist for support with posterior lean as well. Had to assist with manuvering RW as well as pt could not coordinate steps much less coordinate the RW. Was able to ambulate with this asisst into bathroom and then back to recliner. Gave pt washclothes to clean herself and she got them in toilet and appeared to have difficulty problem solving how to clean herself. Uncontrolled descent into chair with PT having to assist.  Gait velocity interpretation: Below normal speed for age/gender    Posture / Balance Dynamic Sitting Balance Sitting balance - Comments: Able to sit unsupported.  Balance Overall balance assessment: Needs assistance, History of Falls Sitting-balance support: No upper extremity supported, Feet supported Sitting balance-Leahy Scale: Fair Sitting  balance - Comments: Able to sit unsupported.  Standing balance support: Bilateral upper extremity supported, During functional activity Standing balance-Leahy Scale: Poor Standing balance comment: RElies on UE support with posterior lean signficant.     Special needs/care consideration BiPAP/CPAP no CPM no Continuous Drip IV dextrose 5% with .45 NaCl with KCl 40 mEq/L 50 ml/hr  Dialysis no  Life Vest no Oxygen no Special Bed no Trach Size no Wound Vac (area) no  Skin WDL  Bowel mgmt Last BM 05/29/15, continent and using BSC with assist  Bladder mgmt: continent And using BSC with assist Diabetic mgmt no     Previous Home Environment Living Arrangements: Spouse/significant other, Children Available Help at Discharge: Family Type of Home: House Home Layout: Two level, Bed/bath upstairs Alternate Level Stairs-Rails: Right Alternate Level Stairs-Number of Steps: 1 flight Home Access: Stairs to enter Entrance Stairs-Rails: Right Entrance Stairs-Number of Steps: 1 through garage, 15 front door Bathroom Shower/Tub: Tub/shower unit, Engineer, building services: Standard Home Care Services: No Additional Comments: All above is per PT, except pt did tell pt she had a tub/shower and standard height toilet seats   Discharge Living Setting Plans for Discharge Living Setting: Patient's home Type of Home at Discharge: House Discharge Home Layout: Two level Discharge Home Access: Stairs to enter Entrance Stairs-Rails: None Entrance Stairs-Number of Steps: 1 (through garage) Discharge Bathroom Shower/Tub: Tub/shower unit Discharge Bathroom Toilet: Standard Discharge Bathroom Accessibility: Yes How Accessible: Accessible via walker Does the patient have any problems obtaining your medications?: No  Social/Family/Support Systems Patient Roles: Parent, Spouse (pt's husband is incarcerated until June per pt. ) Anticipated  Caregiver: daughter Andria Rhein Anticipated Caregiver's Contact Information: [phone] Ability/Limitations of Caregiver: Efraim Kaufmann, daughter, has been staying at her Moms home and taking care of Mel Almond, her half sister. Pt. describes Mel Almond as "a little girl I am raising". Melissa confirms her ability to provide 24 hour supervision for her mom following IP Rehab Caregiver Availability: 24/7 Discharge Plan Discussed with Primary Caregiver: Yes Is Caregiver In Agreement  with Plan?: Yes Does Caregiver/Family have Issues with Lodging/Transportation while Pt is in Rehab?: No   Goals/Additional Needs Patient/Family Goal for Rehab: mod I and supervision PT/OT/SLP Expected length of stay: 11-17 days Cultural Considerations: none Dietary Needs: Dysphagia 2 diet with nectar thick liquids, full supervision Equipment Needs: TBA Pt/Family Agrees to Admission and willing to participate: Yes Program Orientation Provided & Reviewed with Pt/Caregiver Including Roles & Responsibilities: Yes   Decrease burden of Care through IP rehab admission: n/a   Possible need for SNF placement upon discharge: Not anticipated   Patient Condition: This patient's medical and functional status has changed since the consult dated: 05/28/15 in which the Rehabilitation Physician determined and documented that the patient's condition is appropriate for intensive rehabilitative care in an inpatient rehabilitation facility. See "History of Present Illness" (above) for medical update. Functional changes are: Pt. Ambulating mod assist 8' with RW with ataxic gait, diet now dysphagia 2 with nectar thick liquids . Patient's medical and functional status update has been discussed with the Rehabilitation physician and patient remains appropriate for inpatient rehabilitation. Will admit to inpatient rehab today.  Preadmission Screen Completed By: Weldon Picking, 05/30/2015 2:29  PM ______________________________________________________________________  Discussed status with Dr. Riley Kill on 05/30/15 at 1429 and received telephone approval for admission today.  Admission Coordinator: Weldon Picking, time 0102 Dorna Bloom 05/30/15          Cosigned by: Ranelle Oyster, MD at 05/30/2015 3:10 PM  Revision History

## 2015-05-30 NOTE — Progress Notes (Signed)
Inpatient Rehabilitation  Dr. Sharon Seller has cleared pt. for admission to CIR today.  I have notified the patient, her RN Franky Macho, as well as RNCM and CSW.  I willmake all necessare arrangements.  Please call if questions.  Weldon Picking PT Inpatient Rehab Admissions Coordinator Cell [phone] Office [phone]

## 2015-05-30 NOTE — Progress Notes (Signed)
Speech Language Pathology Treatment: Dysphagia  Patient Details Name: Julia Massey MRN: [id number] DOB: January 29, 1951 Today's Date: 05/30/2015 Time: 9758-8325 SLP Time Calculation (min) (ACUTE ONLY): 8 min  Assessment / Plan / Recommendation Clinical Impression  Pt consumed nectar thick liquids and pureed solids with verbal cue x1 for use of chin tuck. Otherwise, she tucks her chin and takes small bites/sips with all intake. She says that she prefers this because it keeps her from "choking". Recommend to continue current diet and swallowing strategies.   HPI HPI: Pt admitted with acute hypoxic respiratory failure and concern for atypical pna. Intubated on 1/5 to 1/15. BSE done on 05/14/15 "Patient with a suspected primary esophageal dysphagia characterized by what appears to be a normal oropharyngeal swallow without overt indication of aspiration with tested pos but with chronic appearing throat clear (at baseline), c/o globus, mid sternal pressure during po intake, and occassional regurgitation of solid pos" Pt underwent barium swallow 1/17, results: " Left lateral evaluation of the hypopharynx demonstrates small volume penetration and trace aspiration. Prominent cricopharyngeus muscle. Limited evaluation of esophageal peristalsis demonstrates suboptimal primary peristaltic wave with contrast stasis throughout the esophagus, likely presbyesophagus.      SLP Plan  Continue with current plan of care     Recommendations  Diet recommendations: Dysphagia 2 (fine chop);Nectar-thick liquid Liquids provided via: Cup;Straw Medication Administration: Whole meds with liquid Supervision: Patient able to self feed;Intermittent supervision to cue for compensatory strategies Compensations: Chin tuck;Slow rate;Small sips/bites Postural Changes and/or Swallow Maneuvers: Seated upright 90 degrees;Upright 30-60 min after meal             Oral Care Recommendations: Oral care BID Follow up Recommendations:  Inpatient Rehab Plan: Continue with current plan of care     GO               Maxcine Ham, M.A. CCC-SLP [phone]  Maxcine Ham 05/30/2015, 11:27 AM

## 2015-05-30 NOTE — Care Management Note (Signed)
Case Management Note  Patient Details  Name: Julia Massey MRN: [id number] Date of Birth: 08/17/1950  Subjective/Objective:           CM following for progression and d/c planning.         Action/Plan: 05/30/15 Pt to d/c to [facility] today.   Expected Discharge Date:      05/30/2015            Expected Discharge Plan:  IP Rehab Facility  In-House Referral:  Clinical Social Work  Discharge planning Services  CM Consult  Post Acute Care Choice:   NA Choice offered to:   NA  DME Arranged:   NA DME Agency:   NA  HH Arranged:   NA HH Agency:   NA  Status of Service:  Completed, signed off  Medicare Important Message Given:    Date Medicare IM Given:    Medicare IM give by:    Date Additional Medicare IM Given:    Additional Medicare Important Message give by:     If discussed at Long Length of Stay Meetings, dates discussed:    Additional Comments:  Starlyn Skeans, RN 05/30/2015, 4:34 PM

## 2015-05-30 NOTE — Interval H&P Note (Signed)
Julia Massey was admitted today to Inpatient Rehabilitation with the diagnosis of encephalopathy.  The patient's history has been reviewed, patient examined, and there is no change in status.  Patient continues to be appropriate for intensive inpatient rehabilitation.  I have reviewed the patient's chart and labs.  Questions were answered to the patient's satisfaction. The PAPE has been reviewed and assessment remains appropriate.  SWARTZ,ZACHARY T 05/30/2015, 7:52 PM

## 2015-05-30 NOTE — H&P (View-Only) (Signed)
  Physical Medicine and Rehabilitation Admission H&P    Chief complaint: Weakness  HPI:Julia Massey is a 65 y.o. right handed female with history of tobacco abuse ,COPD . Patient lives with spouse independent prior to admission. 2 level home with bedroom upstairs. Presented 05/15/2015 with productive cough low-grade fever and chills. Recently placed on a Z-Pak by her PCP. Found to be hypoxic 73% on room air. CT of the chest revealed extensive bilateral patchy infiltrates atypical pneumonia. No evidence of pulmonary emboli. White blood cell count 20,800, lactic acid 2.5. Potassium 3.2. Placed on broad-spectrum antibiotic. Patient required intubation. Echocardiogram with ejection fraction of 60% grade 1 diastolic dysfunction. Blood cultures no growth. Bouts of altered mental status. EEG negative for seizure. Cranial CT scan negative. Extubated 05/26/2015 and follow per critical care. Subcutaneous heparin for DVT prophylaxis. Swallow study completed 05/28/2015 and diet advanced to dysphagia #2 nectar thick liquids. . Physical therapy evaluation completed 05/27/2015 with recommendations of physical medicine rehabilitation consult. Patient was admitted for comprehensive rehabilitation program.   ROS Constitutional: Negative for fever and chills.  HENT: Negative for hearing loss.  Eyes: Negative for blurred vision and double vision.  Respiratory: Positive for cough, sputum production, shortness of breath and wheezing.  Cardiovascular: Positive for leg swelling. Negative for chest pain and palpitations.  Gastrointestinal: Positive for constipation. Negative for nausea and vomiting.  Genitourinary: Negative for dysuria and hematuria.  Musculoskeletal: Positive for myalgias.  Skin: Negative for rash.  Neurological: Positive for weakness. Negative for seizures, loss of consciousness and headaches.  Psychiatric/Behavioral: Positive for depression.  All other systems reviewed and are negative   No  past medical history on file. No past surgical history on file. No family history on file. Social History:  has no tobacco, alcohol, and drug history on file. Allergies: Not on File Medications Prior to Admission  Medication Sig Dispense Refill  . cetirizine (ZYRTEC) 10 MG tablet Take 10 mg by mouth daily as needed for allergies.    . Cyanocobalamin ([employer] B-12) 1000 MCG/15ML LIQD Take 15 mLs by mouth daily.    . FLUoxetine (PROZAC) 10 MG capsule Take 40 mg by mouth daily.    . HYDROcodone-acetaminophen (NORCO) 7.5-325 MG tablet Take 1 tablet by mouth every 6 (six) hours as needed. pain    . Multiple Vitamin (MULTIVITAMIN) tablet Take 1 tablet by mouth daily.    . naproxen sodium (ANAPROX) 220 MG tablet Take 220 mg by mouth 2 (two) times daily as needed (pain).    . Pseudoeph-Doxylamine-DM-APAP (NYQUIL MULTI-SYMPTOM PO) Take 1 capsule by mouth at bedtime as needed (cold symptoms).    . pseudoephedrine (SUDAFED) 30 MG tablet Take 30 mg by mouth every 4 (four) hours as needed for congestion.      Home: Home Living Family/patient expects to be discharged to:: Private residence Living Arrangements: Spouse/significant other, Children Available Help at Discharge: Family Type of Home: House Home Access: Stairs to enter Entrance Stairs-Number of Steps: 1 through garage, 15 front door Entrance Stairs-Rails: Right Home Layout: Two level, Bed/bath upstairs Alternate Level Stairs-Number of Steps: 1 flight Alternate Level Stairs-Rails: Right Home Equipment: None   Functional History: Prior Function Level of Independence: Independent Comments: Pt confused at times and difficulty getting accurate PLOF/history.  Functional Status:  Mobility: Bed Mobility Overal bed mobility: Needs Assistance Bed Mobility: Supine to Sit Supine to sit: Mod assist, HOB elevated General bed mobility comments: Assist with BLEs, to elevate trunk and scoot bottom to EOB. Increased time. + dizziness.    BP  95/74. Transfers Overall transfer level: Needs assistance Equipment used: 2 person hand held assist Transfers: Sit to/from Stand, Stand Pivot Transfers Sit to Stand: +2 physical assistance, Max assist Stand pivot transfers: Total assist, +2 physical assistance General transfer comment: Stood from EOB x1 with cues for hand placement/technique. Pt with posterior lean through hips. Total A of 2 SPT to chair with posterior lean- able to shuffle feet.       ADL:    Cognition: Cognition Overall Cognitive Status: Impaired/Different from baseline Orientation Level: Oriented X4 Cognition Arousal/Alertness: Awake/alert Overall Cognitive Status: Impaired/Different from baseline Area of Impairment: Safety/judgement, Problem solving, Orientation Orientation Level: Disoriented to, Place ("[facility]") Memory: Decreased short-term memory Safety/Judgement: Decreased awareness of safety Problem Solving: Slow processing General Comments: Pt tangential in speech. Starting to answer questions unrelated to question asked.   Physical Exam: Blood pressure 113/70, pulse 92, temperature 98.8 F (37.1 C), temperature source Oral, resp. rate 25, height 5' (1.524 m), weight 49.7 kg (109 lb 9.1 oz), SpO2 98 %. Physical Exam Constitutional:  65-year-old frail Caucasian female sitting up in chair  HENT:  Head: Normocephalic and atraumatic.  Eyes: Conjunctivae and EOM are normal. Pupils are equal, round, and reactive to light.  Neck: Normal range of motion. Neck supple. No tracheal deviation present. No thyromegaly present.  Cardiovascular: Regular rhythm.  HR 90's  Respiratory: She has no wheezes. She has no rales.  Decreased breath sounds at the bases with good inspiratory effort. Chest tender to palpation along left ribs 4-6. More pain with inspiration GI: Soft. She exhibits no distension.  Neurological: She is alert. No cranial nerve deficit. Coordination normal.  Makes good eye contact  with examiner. Oriented to month and year, place. Improved awareness in general.  Answered biographical questions.  No sensory deficits. MMT remains: UE 3/5 delt,bicep,wrist, tricep, 4/5 HI. LE: 3/5 HF, KE and 4/5 ADF/APF.  Skin: Skin is warm and dry.  Psychiatric:  Pleasant and cooperative. Less confused.    Results for orders placed or performed during the hospital encounter of 05/14/15 (from the past 48 hour(s))  Glucose, capillary     Status: Abnormal   Collection Time: 05/26/15 11:26 AM  Result Value Ref Range   Glucose-Capillary 160 (H) 65 - 99 mg/dL  Glucose, capillary     Status: Abnormal   Collection Time: 05/26/15  4:16 PM  Result Value Ref Range   Glucose-Capillary 143 (H) 65 - 99 mg/dL  Glucose, capillary     Status: Abnormal   Collection Time: 05/26/15  7:50 PM  Result Value Ref Range   Glucose-Capillary 120 (H) 65 - 99 mg/dL  Glucose, capillary     Status: Abnormal   Collection Time: 05/27/15 12:29 AM  Result Value Ref Range   Glucose-Capillary 140 (H) 65 - 99 mg/dL  Glucose, capillary     Status: Abnormal   Collection Time: 05/27/15  3:03 AM  Result Value Ref Range   Glucose-Capillary 115 (H) 65 - 99 mg/dL  CBC     Status: Abnormal   Collection Time: 05/27/15  3:12 AM  Result Value Ref Range   WBC 17.6 (H) 4.0 - 10.5 K/uL   RBC 4.41 3.87 - 5.11 MIL/uL   Hemoglobin 13.0 12.0 - 15.0 g/dL   HCT 42.6 36.0 - 46.0 %   MCV 96.6 78.0 - 100.0 fL   MCH 29.5 26.0 - 34.0 pg   MCHC 30.5 30.0 - 36.0 g/dL   RDW 14.2 11.5 - 15.5 %     Platelets 170 150 - 400 K/uL  Basic metabolic panel     Status: Abnormal   Collection Time: 05/27/15  3:12 AM  Result Value Ref Range   Sodium 144 135 - 145 mmol/L   Potassium 3.2 (L) 3.5 - 5.1 mmol/L   Chloride 95 (L) 101 - 111 mmol/L   CO2 33 (H) 22 - 32 mmol/L   Glucose, Bld 119 (H) 65 - 99 mg/dL   BUN 62 (H) 6 - 20 mg/dL   Creatinine, Ser 0.50 0.44 - 1.00 mg/dL   Calcium 10.3 8.9 - 10.3 mg/dL   GFR calc non Af Amer >60 >60 mL/min    GFR calc Af Amer >60 >60 mL/min    Comment: (NOTE) The eGFR has been calculated using the CKD EPI equation. This calculation has not been validated in all clinical situations. eGFR's persistently <60 mL/min signify possible Chronic Kidney Disease.    Anion gap 16 (H) 5 - 15  Magnesium     Status: Abnormal   Collection Time: 05/27/15  3:12 AM  Result Value Ref Range   Magnesium 2.8 (H) 1.7 - 2.4 mg/dL  Phosphorus     Status: Abnormal   Collection Time: 05/27/15  3:12 AM  Result Value Ref Range   Phosphorus 5.0 (H) 2.5 - 4.6 mg/dL  Glucose, capillary     Status: Abnormal   Collection Time: 05/27/15  8:03 AM  Result Value Ref Range   Glucose-Capillary 113 (H) 65 - 99 mg/dL  Blood gas, arterial     Status: Abnormal   Collection Time: 05/27/15 10:05 AM  Result Value Ref Range   O2 Content 0.5 L/min   Delivery systems NASAL CANNULA    pH, Arterial 7.491 (H) 7.350 - 7.450   pCO2 arterial 40.5 35.0 - 45.0 mmHg   pO2, Arterial 72.4 (L) 80.0 - 100.0 mmHg   Bicarbonate 30.6 (H) 20.0 - 24.0 mEq/L   TCO2 31.9 0 - 100 mmol/L   Acid-Base Excess 7.0 (H) 0.0 - 2.0 mmol/L   O2 Saturation 95.1 %   Patient temperature 98.6    Collection site LEFT RADIAL    Drawn by [id number]    Sample type ARTERIAL DRAW    Allens test (pass/fail) PASS PASS  Glucose, capillary     Status: Abnormal   Collection Time: 05/27/15 11:45 AM  Result Value Ref Range   Glucose-Capillary 139 (H) 65 - 99 mg/dL  Glucose, capillary     Status: Abnormal   Collection Time: 05/27/15  3:42 PM  Result Value Ref Range   Glucose-Capillary 113 (H) 65 - 99 mg/dL  Glucose, capillary     Status: Abnormal   Collection Time: 05/27/15  7:52 PM  Result Value Ref Range   Glucose-Capillary 110 (H) 65 - 99 mg/dL  Glucose, capillary     Status: Abnormal   Collection Time: 05/28/15 12:17 AM  Result Value Ref Range   Glucose-Capillary 110 (H) 65 - 99 mg/dL  Glucose, capillary     Status: Abnormal   Collection Time: 05/28/15  4:32 AM   Result Value Ref Range   Glucose-Capillary 112 (H) 65 - 99 mg/dL  CBC     Status: Abnormal   Collection Time: 05/28/15  7:18 AM  Result Value Ref Range   WBC 15.1 (H) 4.0 - 10.5 K/uL   RBC 4.80 3.87 - 5.11 MIL/uL   Hemoglobin 14.4 12.0 - 15.0 g/dL   HCT 45.5 36.0 - 46.0 %   MCV 94.8 78.0 - 100.0 fL  MCH 30.0 26.0 - 34.0 pg   MCHC 31.6 30.0 - 36.0 g/dL   RDW 14.0 11.5 - 15.5 %   Platelets 220 150 - 400 K/uL  Basic metabolic panel     Status: Abnormal   Collection Time: 05/28/15  7:18 AM  Result Value Ref Range   Sodium 143 135 - 145 mmol/L   Potassium 4.1 3.5 - 5.1 mmol/L   Chloride 102 101 - 111 mmol/L   CO2 31 22 - 32 mmol/L   Glucose, Bld 132 (H) 65 - 99 mg/dL   BUN 58 (H) 6 - 20 mg/dL   Creatinine, Ser 0.63 0.44 - 1.00 mg/dL   Calcium 10.2 8.9 - 10.3 mg/dL   GFR calc non Af Amer >60 >60 mL/min   GFR calc Af Amer >60 >60 mL/min    Comment: (NOTE) The eGFR has been calculated using the CKD EPI equation. This calculation has not been validated in all clinical situations. eGFR's persistently <60 mL/min signify possible Chronic Kidney Disease.    Anion gap 10 5 - 15  Glucose, capillary     Status: Abnormal   Collection Time: 05/28/15  8:05 AM  Result Value Ref Range   Glucose-Capillary 119 (H) 65 - 99 mg/dL   Dg Esophagus  05/27/2015  CLINICAL DATA:  Dysphagia. Chronic esophageal dysphagia with acute reversible oralpharyngeal dysphagia after prolonged intubation. Per speech pathology, patient not safe to advance diet. Study requested regardless. EXAM: ESOPHOGRAM/BARIUM SWALLOW TECHNIQUE: Single contrast examination was performed using  thin barium. FLUOROSCOPY TIME:  If the device does not provide the exposure index: Fluoroscopy Time:  3 minutes and 0 seconds Number of Acquired Images:  None COMPARISON:  Chest radiograph of 05/26/2015 FINDINGS: Moderately limited exam. Patient was relatively immobile and evaluated in a left lateral and LPO position. Left lateral evaluation of  the hypopharynx demonstrates small volume penetration and trace aspiration. Prominent cricopharyngeus muscle. Limited evaluation of esophageal peristalsis demonstrates suboptimal primary peristaltic wave with contrast stasis throughout the esophagus. Attempted full column evaluation of the esophagus demonstrates no persistent narrowing or high-grade stricture. IMPRESSION: 1. Moderately degraded exam, as detailed above. 2. Penetration and trace aspiration with single swallows. Please see speech pathology evaluation. 3. Esophageal dysmotility, likely presbyesophagus. 4. No high-grade stricture or other anatomic cause for patient's symptoms within the thoracic esophagus. Electronically Signed   By: Abigail Miyamoto M.D.   On: 05/27/2015 16:17   Dg Chest Port 1 View  05/28/2015  CLINICAL DATA:  Respiratory distress. EXAM: PORTABLE CHEST 1 VIEW COMPARISON:  05/26/2015.  05/25/2015.  05/21/2015 .  CT 05/15/2015 FINDINGS: Mediastinum hilar structures normal. Cardiomegaly with normal pulmonary vascularity. Cardiomegaly. Bilateral pulmonary interstitial prominence. Low lung volumes with basilar atelectasis unchanged. No pleural effusion or pneumothorax. IMPRESSION: 1. Stable bilateral pulmonary interstitial prominence. Stable bibasilar atelectasis. 2. Stable cardiomegaly. Electronically Signed   By: Marcello Moores  Register   On: 05/28/2015 07:31       Medical Problem List and Plan:  1.  Debilitation with encephalopathy secondary to acute respiratory failure 2.  DVT Prophylaxis/Anticoagulation: Subcutaneous heparin. Monitor platelet counts of any signs of bleeding 3. Pain Management/musculoskeletal chest and back pain: Tylenol as needed. Ice pack as needed 4. Mood: Continue Prozac 40 mg daily 5. Neuropsych: This patient is capable of making decisions on her own behalf. 6. Skin/Wound Care: Routine skin checks 7. Fluids/Electrolytes/Nutrition: Routine eye and nose with plan follow-up chemistries 8. Tobacco abuse/COPD.  Provide counseling. Check oxygen saturations every shift 9. Dysphagia. Dysphagia #2 nectar thick liquids.  Follow-up speech therapy. Monitor hydration 10. Chest wall pain: appears musculoskeletal. Ice applied, analgesics, monitor for now.     Post Admission Physician Evaluation: 1. Functional deficits secondary  to encephalopathy, deconditioning. 2. Patient is admitted to receive collaborative, interdisciplinary care between the physiatrist, rehab nursing staff, and therapy team. 3. Patient's level of medical complexity and substantial therapy needs in context of that medical necessity cannot be provided at a lesser intensity of care such as a SNF. 4. Patient has experienced substantial functional loss from his/her baseline which was documented above under the "Functional History" and "Functional Status" headings.  Judging by the patient's diagnosis, physical exam, and functional history, the patient has potential for functional progress which will result in measurable gains while on inpatient rehab.  These gains will be of substantial and practical use upon discharge  in facilitating mobility and self-care at the household level. 5. Physiatrist will provide 24 hour management of medical needs as well as oversight of the therapy plan/treatment and provide guidance as appropriate regarding the interaction of the two. 6. 24 hour rehab nursing will assist with bladder management, bowel management, safety, skin/wound care, disease management, medication administration, pain management and patient education  and help integrate therapy concepts, techniques,education, etc. 7. PT will assess and treat for/with: Lower extremity strength, range of motion, stamina, balance, functional mobility, safety, adaptive techniques and equipment, NMR, pain mgt, cognitive-perceptual rx, family and patient education, community reintegration.   Goals are: supervision. 8. OT will assess and treat for/with: ADL's, functional  mobility, safety, upper extremity strength, adaptive techniques and equipment, NMR, cognitive perceptual rx, family and patient education.   Goals are: supervision. Therapy may proceed with showering this patient. 9. SLP will assess and treat for/with: cognition, communication, swallowing, behavior, education.  Goals are: supervision. 10. Case Management and Social Worker will assess and treat for psychological issues and discharge planning. 11. Team conference will be held weekly to assess progress toward goals and to determine barriers to discharge. 12. Patient will receive at least 3 hours of therapy per day at least 5 days per week. 13. ELOS: 10-15 days       14. Prognosis:  excellent     Meredith Staggers, MD, [place] [facility] 05/30/2015   05/28/2015

## 2015-05-30 NOTE — PMR Pre-admission (Signed)
PMR Admission Coordinator Pre-Admission Assessment  Patient: Julia Massey is an 65 y.o., female MRN: [id number] DOB: [birth year] Height: 5' (152.4 cm) Weight: 47.5 kg (104 lb 11.5 oz)              Insurance Information HMO:     PPO: yes     PCP:      IPA:      80/20:      OTHER:  PRIMARY:  [employer] [place]      Policy#: [id number]      Subscriber:  self CM Name:  Julia Massey      Phone#:  [phone]   Fax#:  [phone] Pre-Cert#: [id number] , pt. approved through 06/11/2015 with clinical updates due on 06/09/15 to Clydie Braun     Employer:   Benefits:  Phone #:  [phone]     Name: Julia Massey. Massey:  05/11/15     Deduct:  $1000      Out of Pocket Max:  $2350      Life Max:  n/a CIR:  70%/30%      SNF:  70%/30% Outpatient:  70%     Co-Pay: 30% Home Health:    70%    Co-Pay: 30% DME:  70%     Co-Pay: 30% Providers:  In network SECONDARY:      Policy#:       Subscriber:  CM Name:       Phone#:      Fax#:  Pre-Cert#:       Employer:  Benefits:  Phone #:      Name:  Julia Massey:      Deduct:       Out of Pocket Max:       Life Max:  CIR:       SNF:  Outpatient:      Co-Pay:  Home Health:       Co-Pay:  DME:      Co-Pay:   Medicaid Application Massey:       Case Manager:  Disability Application Massey:       Case Worker:   Emergency Contact Information Contact Information    Name Relation Home Work Mobile   Julia Massey  [phone]     Andria Massey Daughter [phone]       Current Medical History  Patient Admitting Diagnosis: debility and encephalopathy due to multiple medical issues above History of Present Illness: Julia Massey is a 65 y.o. right handed female with history of tobacco abuse ,COPD . Patient lives with spouse independent prior to admission. 2 level home with bedroom upstairs. Presented 05/15/2015 with productive cough low-grade fever and chills. Recently placed on a Z-Pak by her PCP. Found to be hypoxic 73% on room air. CT of the chest revealed extensive bilateral  patchy infiltrates atypical pneumonia. No evidence of pulmonary emboli. White blood cell count 20,800, lactic acid 2.5. Potassium 3.2. Placed on broad-spectrum antibiotic. Patient required intubation. Echocardiogram with ejection fraction of 60% grade 1 diastolic dysfunction. Blood cultures no growth. Bouts of altered mental status. EEG negative for seizure. Cranial CT scan negative. Extubated 05/26/2015 and follow per critical care. Subcutaneous heparin for DVT prophylaxis. Swallow study completed 05/28/2015 and diet advanced to dysphagia #2 nectar thick liquids. . Physical therapy evaluation completed 05/27/2015 with recommendations of physical medicine rehabilitation consult. Patient was admitted for comprehensive rehabilitation program.       Past Medical History  No past medical history on file.  Family History  family history is not  on file.  Prior Rehab/Hospitalizations:  Has the patient had major surgery during 100 days prior to admission? No  Current Medications   Current facility-administered medications:  .  acetaminophen (TYLENOL) solution 650 mg, 650 mg, Oral, Q6H PRN, Lonia Blood, MD .  antiseptic oral rinse (CPC / CETYLPYRIDINIUM CHLORIDE 0.05%) solution 7 mL, 7 mL, Mouth Rinse, BID, Leslye Peer, MD, 7 mL at 05/30/15 1040 .  bisacodyl (DULCOLAX) suppository 10 mg, 10 mg, Rectal, Daily PRN, Lupita Leash, MD, 10 mg at 05/22/15 1645 .  dextrose 5 % and 0.45 % NaCl with KCl 40 mEq/L infusion, , Intravenous, Continuous, Merwyn Katos, MD, Last Rate: 50 mL/hr at 05/30/15 1225 .  FLUoxetine (PROZAC) capsule 40 mg, 40 mg, Oral, Daily, Belinda Fisher Stone, RPH, 40 mg at 05/30/15 1034 .  folic acid (FOLVITE) tablet 1 mg, 1 mg, Oral, Daily, Lonia Blood, MD, 1 mg at 05/30/15 1034 .  food thickener (THICK IT) powder, , Oral, PRN, Drema Dallas, MD .  heparin injection 5,000 Units, 5,000 Units, Subcutaneous, 3 times per day, Fuller Plan, MD, 5,000 Units at 05/30/15  0531 .  ipratropium-albuterol (DUONEB) 0.5-2.5 (3) MG/3ML nebulizer solution 3 mL, 3 mL, Nebulization, Q4H PRN, Cyril Mourning V, MD .  multivitamin with minerals tablet 1 tablet, 1 tablet, Oral, Daily, Darreld Mclean, MD, 1 tablet at 05/30/15 1034 .  polyethylene glycol (MIRALAX / GLYCOLAX) packet 17 g, 17 g, Oral, Daily, Lupita Leash, MD, 17 g at 05/30/15 1033 .  thiamine (VITAMIN B-1) tablet 100 mg, 100 mg, Oral, Daily, Lonia Blood, MD, 100 mg at 05/30/15 1033  Patients Current Diet: DIET DYS 2 Room service appropriate?: Yes; Fluid consistency:: Nectar Thick  Precautions / Restrictions Precautions Precautions: Fall Restrictions Weight Bearing Restrictions: No   Has the patient had 2 or more falls or a fall with injury in the past year?No  Prior Activity Level Community (5-7x/wk): Pt. reports being independent PTA and out of her home most days. Pt. drives and enjoys going Engineer, maintenance (IT) / Equipment Home Assistive Devices/Equipment: None Home Equipment: None  Prior Device Use: Indicate devices/aids used by the patient prior to current illness, exacerbation or injury? None of the above  Prior Functional Level Prior Function Level of Independence: Independent Comments: Pt confused at times and difficulty getting accurate PLOF/history.  Self Care: Did the patient need help bathing, dressing, using the toilet or eating?  Independent  Indoor Mobility: Did the patient need assistance with walking from room to room (with or without device)? Independent  Stairs: Did the patient need assistance with internal or external stairs (with or without device)? Independent  Functional Cognition: Did the patient need help planning regular tasks such as shopping or remembering to take medications? Independent  Current Functional Level Cognition  Overall Cognitive Status: Impaired/Different from baseline Orientation Level: Oriented to person, Oriented to place, Oriented  to time, Disoriented to situation Safety/Judgement: Decreased awareness of safety General Comments: While performing tasks, pt somewhat antsy and trying to sit "[place] style" while therapist assisting with grooming sitting EOB.     Extremity Assessment (includes Sensation/Coordination)  Upper Extremity Assessment: Generalized weakness  Lower Extremity Assessment: Generalized weakness    ADLs  Overall ADL's : Needs assistance/impaired Eating/Feeding: Set up, Sitting Grooming: Minimal assistance, Sitting Grooming Details (indicate cue type and reason): EOB, pt also trying to sit "[place] style" in bed  Upper Body Bathing: Minimal assitance, Sitting Lower Body Bathing: Moderate assistance,  Sit to/from stand Upper Body Dressing : Minimal assistance, Sitting Lower Body Dressing: Moderate assistance, Sit to/from stand Toilet Transfer: Minimal assistance, Tax adviser Details (indicate cue type and reason): simulated EOB to recliner transfer  Toileting - Clothing Manipulation Details (indicate cue type and reason): did not occur Tub/Shower Transfer Details (indicate cue type and reason): did not occur Functional mobility during ADLs: Minimal assistance, Cueing for safety, Cueing for sequencing General ADL Comments: Pt motivated to regain independence    Mobility  Overal bed mobility: Needs Assistance Bed Mobility: Supine to Sit Supine to sit: Min assist, HOB elevated General bed mobility comments: Pt used rail and was able to get her LEs off bed without assist.     Transfers  Overall transfer level: Needs assistance Equipment used: Rolling walker (2 wheeled) Transfers: Sit to/from Stand Sit to Stand: Min assist, Mod assist, From elevated surface Stand pivot transfers: Min assist General transfer comment: Attempted to stand without device with pt reaching for PTs hands and very unsteady with posterior lean therefore pt sat back on bed and PT obtained RW to use.  Pt still  needed mod assist to power up and mod assist to steady pt with pt standing on tiptoes.      Ambulation / Gait / Stairs / Wheelchair Mobility  Ambulation/Gait Ambulation/Gait assistance: Mod assist Ambulation Distance (Feet): 30 Feet (10 feet then 20 feet) Assistive device: Rolling walker (2 wheeled) Gait Pattern/deviations: Step-to pattern, Decreased step length - right, Decreased step length - left, Shuffle, Ataxic, Leaning posteriorly, Staggering left, Staggering right, Narrow base of support General Gait Details: Pt ambulated on tip toes initially and could bring heels to floor with cues. Pt very ataxic with gait with difficulty placing feet for ambulation.  Pt very unsteady needing mod assist for support with posterior lean as well.  Had to assist with manuvering RW as well as pt could not coordinate steps much less coordinate the RW.  Was able to ambulate with this asisst into bathroom and then back to recliner.  Gave pt washclothes to clean herself and she got them in toilet and appeared to have difficulty problem solving how to clean herself.  Uncontrolled descent into chair with PT having to assist.  Gait velocity interpretation: Below normal speed for age/gender    Posture / Balance Dynamic Sitting Balance Sitting balance - Comments: Able to sit unsupported.  Balance Overall balance assessment: Needs assistance, History of Falls Sitting-balance support: No upper extremity supported, Feet supported Sitting balance-Leahy Scale: Fair Sitting balance - Comments: Able to sit unsupported.  Standing balance support: Bilateral upper extremity supported, During functional activity Standing balance-Leahy Scale: Poor Standing balance comment: RElies on UE support with posterior lean signficant.     Special needs/care consideration BiPAP/CPAP   no CPM   no Continuous Drip IV dextrose 5% with .45 NaCl with KCl 40 mEq/L 50 ml/hr   Dialysis  no        Life Vest  no Oxygen  no Special Bed  no Trach Size   no Wound Vac (area)   no      Skin  WDL                               Bowel mgmt    Last BM 05/29/15, continent and using BSC with assist  Bladder mgmt: continent  And using BSC with assist Diabetic mgmt   no     Previous Home  Environment Living Arrangements: Spouse/significant other, Children Available Help at Discharge: Family Type of Home: House Home Layout: Two level, Bed/bath upstairs Alternate Level Stairs-Rails: Right Alternate Level Stairs-Number of Steps: 1 flight Home Access: Stairs to enter Entrance Stairs-Rails: Right Entrance Stairs-Number of Steps: 1 through garage, 15 front door Bathroom Shower/Tub: Tub/shower unit, Engineer, building services: Standard Home Care Services: No Additional Comments: All above is per PT, except pt did tell pt she had a tub/shower and standard height toilet seats   Discharge Living Setting Plans for Discharge Living Setting: Patient's home Type of Home at Discharge: House Discharge Home Layout: Two level Discharge Home Access: Stairs to enter Entrance Stairs-Rails: None Entrance Stairs-Number of Steps: 1 (through garage) Discharge Bathroom Shower/Tub: Tub/shower unit Discharge Bathroom Toilet: Standard Discharge Bathroom Accessibility: Yes How Accessible: Accessible via walker Does the patient have any problems obtaining your medications?: No  Social/Family/Support Systems Patient Roles: Parent, Spouse (pt's husband is incarcerated until June per pt. ) Anticipated Caregiver: daughter Andria Massey Anticipated Caregiver's Contact Information: [phone] Ability/Limitations of Caregiver: Efraim Kaufmann, daughter, has been staying at her Moms home and taking care of Mel Almond, her half sister.  Pt. describes Mel Almond as "a little girl I am raising".  Melissa confirms her ability to provide 24 hour supervision for her mom following IP Rehab Caregiver Availability: 24/7 Discharge Plan Discussed with Primary Caregiver: Yes Is Caregiver  In Agreement with Plan?: Yes Does Caregiver/Family have Issues with Lodging/Transportation while Pt is in Rehab?: No   Goals/Additional Needs Patient/Family Goal for Rehab: mod I and supervision PT/OT/SLP Expected length of stay: 11-17 days Cultural Considerations: none Dietary Needs: Dysphagia 2 diet with nectar thick liquids, full supervision Equipment Needs: TBA Pt/Family Agrees to Admission and willing to participate: Yes Program Orientation Provided & Reviewed with Pt/Caregiver Including Roles  & Responsibilities: Yes   Decrease burden of Care through IP rehab admission:  n/a   Possible need for SNF placement upon discharge:   Not anticipated   Patient Condition: This patient's medical and functional status has changed since the consult dated: 05/28/15  in which the Rehabilitation Physician determined and documented that the patient's condition is appropriate for intensive rehabilitative care in an inpatient rehabilitation facility. See "History of Present Illness" (above) for medical update. Functional changes are: Pt. Ambulating mod assist 3' with RW with ataxic gait, diet now dysphagia 2 with nectar thick liquids . Patient's medical and functional status update has been discussed with the Rehabilitation physician and patient remains appropriate for inpatient rehabilitation. Will admit to inpatient rehab today.  Preadmission Screen Completed By:  Weldon Picking, 05/30/2015 2:29 PM ______________________________________________________________________   Discussed status with Dr.  Riley Kill on 05/30/15 at  1429  and received telephone approval for admission today.  Admission Coordinator:  Weldon Picking, time 1429 Dorna Bloom 05/30/15

## 2015-05-30 NOTE — Progress Notes (Signed)
Inpatient Rehabilitation  I have received insurance approval to admit pt. to IP Rehab.  I have notified pt. and await medical clearance from Dr. Sharon Seller.  Pt. currently having some "grabbing" chest pain in left chest which I reported to pt's RN Franky Macho as well as to Dr. Sharon Seller.  Dr. Sharon Seller to see pt. I will follow up later this afternoon.  Please call if questions.  Weldon Picking PT Inpatient Rehab Admissions Coordinator Cell [phone] Office [phone]

## 2015-05-30 NOTE — Progress Notes (Signed)
Ranelle Oyster, MD Physician Signed Physical Medicine and Rehabilitation Consult Note 05/27/2015 1:43 PM  Related encounter: Admission (Current) from 05/14/2015 in [facility]E MEDICAL/RENAL    Expand All Collapse All        Physical Medicine and Rehabilitation Consult Reason for Consult: Debilitation with acute respiratory failure/pneumonia Referring Physician: Critical care   HPI: Julia Massey is a 65 y.o. right handed female with history of tobacco abuse ,COPD . Patient lives with spouse independent prior to admission. 2 level home with bedroom upstairs. Presented 05/15/2015 with productive cough low-grade fever and chills. Recently placed on a Z-Pak by her PCP. Found to be hypoxic 73% on room air. CT of the chest revealed extensive bilateral patchy infiltrates atypical pneumonia. No evidence of pulmonary emboli. White blood cell count 20,800, lactic acid 2.5. Potassium 3.2. Placed on broad-spectrum antibiotic. Patient required intubation. Echocardiogram with ejection fraction of 60% grade 1 diastolic dysfunction. Blood cultures no growth. Bouts of altered mental status. EEG negative for seizure. Cranial CT scan negative. Extubated 05/26/2015 and follow per critical care. Subcutaneous heparin for DVT prophylaxis. Patient remains nothing by mouth with plan swallow study. Physical therapy evaluation completed 05/27/2015 with recommendations of physical medicine rehabilitation consult.   Review of Systems  Constitutional: Negative for fever and chills.  HENT: Negative for hearing loss.  Eyes: Negative for blurred vision and double vision.  Respiratory: Positive for cough, sputum production, shortness of breath and wheezing.  Cardiovascular: Positive for leg swelling. Negative for chest pain and palpitations.  Gastrointestinal: Positive for constipation. Negative for nausea and vomiting.  Genitourinary: Negative for dysuria and hematuria.  Musculoskeletal: Positive  for myalgias.  Skin: Negative for rash.  Neurological: Positive for weakness. Negative for seizures, loss of consciousness and headaches.  Psychiatric/Behavioral: Positive for depression.  All other systems reviewed and are negative.  No past medical history on file. No past surgical history on file. No family history on file. Social History:  has no tobacco, alcohol, and drug history on file. Allergies: Not on File Medications Prior to Admission  Medication Sig Dispense Refill  . cetirizine (ZYRTEC) 10 MG tablet Take 10 mg by mouth daily as needed for allergies.    . Cyanocobalamin ([employer] B-12) 1000 MCG/15ML LIQD Take 15 mLs by mouth daily.    Marland Kitchen FLUoxetine (PROZAC) 10 MG capsule Take 40 mg by mouth daily.    Marland Kitchen HYDROcodone-acetaminophen (NORCO) 7.5-325 MG tablet Take 1 tablet by mouth every 6 (six) hours as needed. pain    . Multiple Vitamin (MULTIVITAMIN) tablet Take 1 tablet by mouth daily.    . naproxen sodium (ANAPROX) 220 MG tablet Take 220 mg by mouth 2 (two) times daily as needed (pain).    . Pseudoeph-Doxylamine-DM-APAP (NYQUIL MULTI-SYMPTOM PO) Take 1 capsule by mouth at bedtime as needed (cold symptoms).    . pseudoephedrine (SUDAFED) 30 MG tablet Take 30 mg by mouth every 4 (four) hours as needed for congestion.      Home: Home Living Family/patient expects to be discharged to:: Private residence Living Arrangements: Spouse/significant other, Children Available Help at Discharge: Family Type of Home: House Home Access: Stairs to enter [employer] of Steps: 1 through garage, 15 front door Entrance Stairs-Rails: Right Home Layout: Two level, Bed/bath upstairs Alternate Level Stairs-Number of Steps: 1 flight Alternate Level Stairs-Rails: Right Home Equipment: None  Functional History: Prior Function Level of Independence: Independent Comments: Pt confused at times and difficulty getting accurate  PLOF/history. Functional Status:  Mobility: Bed Mobility  Overal bed mobility: Needs Assistance Bed Mobility: Supine to Sit Supine to sit: Mod assist, HOB elevated General bed mobility comments: Assist with BLEs, to elevate trunk and scoot bottom to EOB. Increased time. + dizziness. BP 95/74. Transfers Overall transfer level: Needs assistance Equipment used: 2 person hand held assist Transfers: Sit to/from Stand, Stand Pivot Transfers Sit to Stand: +2 physical assistance, Max assist Stand pivot transfers: Total assist, +2 physical assistance General transfer comment: Stood from EOB x1 with cues for hand placement/technique. Pt with posterior lean through hips. Total A of 2 SPT to chair with posterior lean- able to shuffle feet.       ADL:    Cognition: Cognition Overall Cognitive Status: Impaired/Different from baseline Orientation Level: Oriented to person, Oriented to place, Oriented to time, Disoriented to situation Cognition Arousal/Alertness: Awake/alert Overall Cognitive Status: Impaired/Different from baseline Area of Impairment: Safety/judgement, Problem solving, Orientation Orientation Level: Disoriented to, Place ("[facility]") Memory: Decreased short-term memory Safety/Judgement: Decreased awareness of safety Problem Solving: Slow processing General Comments: Pt tangential in speech. Starting to answer questions unrelated to question asked.   Blood pressure 104/73, pulse 93, temperature 97 F (36.1 C), temperature source Oral, resp. rate 19, height 5' (1.524 m), weight 46.2 kg (101 lb 13.6 oz), SpO2 95 %. Physical Exam  Constitutional:  65 year old frail Caucasian female sitting up in chair  HENT:  Head: Normocephalic and atraumatic.  Eyes: Conjunctivae and EOM are normal. Pupils are equal, round, and reactive to light.  Neck: Normal range of motion. Neck supple. No tracheal deviation present. No thyromegaly present.  Cardiovascular: Regular  rhythm.  Tachycardic 100-110  Respiratory: She has no wheezes. She has no rales.  Decreased breath sounds at the bases with good inspiratory effort  GI: Soft. She exhibits no distension.  Neurological: She is alert. No cranial nerve deficit. Coordination normal.  Makes good eye contact with examiner. Oriented to month and year. Did not know she was in [place] or [place]. Answered some biographical questions (unclear how accurate). No sensory deficits. MMT:UE 3/5 delt,bicep,wrist, tricep, 4/5 HI. LE: 3/5 HF, KE and 4/5 ADF/APF.  Skin: Skin is warm and dry.  Psychiatric:  Pleasant and cooperative. Slightly confused.     Lab Results Last 24 Hours    Results for orders placed or performed during the hospital encounter of 05/14/15 (from the past 24 hour(s))  Glucose, capillary Status: Abnormal   Collection Time: 05/26/15 4:16 PM  Result Value Ref Range   Glucose-Capillary 143 (H) 65 - 99 mg/dL  Glucose, capillary Status: Abnormal   Collection Time: 05/26/15 7:50 PM  Result Value Ref Range   Glucose-Capillary 120 (H) 65 - 99 mg/dL  Glucose, capillary Status: Abnormal   Collection Time: 05/27/15 12:29 AM  Result Value Ref Range   Glucose-Capillary 140 (H) 65 - 99 mg/dL  Glucose, capillary Status: Abnormal   Collection Time: 05/27/15 3:03 AM  Result Value Ref Range   Glucose-Capillary 115 (H) 65 - 99 mg/dL  CBC Status: Abnormal   Collection Time: 05/27/15 3:12 AM  Result Value Ref Range   WBC 17.6 (H) 4.0 - 10.5 K/uL   RBC 4.41 3.87 - 5.11 MIL/uL   Hemoglobin 13.0 12.0 - 15.0 g/dL   HCT 20.2 54.2 - 70.6 %   MCV 96.6 78.0 - 100.0 fL   MCH 29.5 26.0 - 34.0 pg   MCHC 30.5 30.0 - 36.0 g/dL   RDW 23.7 62.8 - 31.5 %   Platelets 170 150 - 400 K/uL  Basic metabolic panel Status: Abnormal   Collection Time: 05/27/15 3:12 AM  Result Value Ref Range   Sodium 144 135  - 145 mmol/L   Potassium 3.2 (L) 3.5 - 5.1 mmol/L   Chloride 95 (L) 101 - 111 mmol/L   CO2 33 (H) 22 - 32 mmol/L   Glucose, Bld 119 (H) 65 - 99 mg/dL   BUN 62 (H) 6 - 20 mg/dL   Creatinine, Ser 8.29 0.44 - 1.00 mg/dL   Calcium 93.7 8.9 - 16.9 mg/dL   GFR calc non Af Amer >60 >60 mL/min   GFR calc Af Amer >60 >60 mL/min   Anion gap 16 (H) 5 - 15  Magnesium Status: Abnormal   Collection Time: 05/27/15 3:12 AM  Result Value Ref Range   Magnesium 2.8 (H) 1.7 - 2.4 mg/dL  Phosphorus Status: Abnormal   Collection Time: 05/27/15 3:12 AM  Result Value Ref Range   Phosphorus 5.0 (H) 2.5 - 4.6 mg/dL  Glucose, capillary Status: Abnormal   Collection Time: 05/27/15 8:03 AM  Result Value Ref Range   Glucose-Capillary 113 (H) 65 - 99 mg/dL  Blood gas, arterial Status: Abnormal   Collection Time: 05/27/15 10:05 AM  Result Value Ref Range   O2 Content 0.5 L/min   Delivery systems NASAL CANNULA    pH, Arterial 7.491 (H) 7.350 - 7.450   pCO2 arterial 40.5 35.0 - 45.0 mmHg   pO2, Arterial 72.4 (L) 80.0 - 100.0 mmHg   Bicarbonate 30.6 (H) 20.0 - 24.0 mEq/L   TCO2 31.9 0 - 100 mmol/L   Acid-Base Excess 7.0 (H) 0.0 - 2.0 mmol/L   O2 Saturation 95.1 %   Patient temperature 98.6    Collection site LEFT RADIAL    Drawn by [id number]    Sample type ARTERIAL DRAW    Allens test (pass/fail) PASS PASS  Glucose, capillary Status: Abnormal   Collection Time: 05/27/15 11:45 AM  Result Value Ref Range   Glucose-Capillary 139 (H) 65 - 99 mg/dL      Imaging Results (Last 48 hours)    Dg Chest Port 1 View  05/26/2015 CLINICAL DATA: Endotracheal tube EXAM: PORTABLE CHEST 1 VIEW COMPARISON: Yesterday FINDINGS: Endotracheal tube tip in stable position between the clavicular heads and carina. Left IJ central line with tip at the SVC level. Two  enteric tubes at least reaches the stomach. Normal heart size and stable mediastinal contours. Unchanged lung volumes with basilar and peripheral predominant coarse interstitial opacities. No edema, effusion, or pneumothorax. IMPRESSION: 1. Stable positioning of tubes and central line. 2. Stable lung inflation since yesterday. Airspace opacities have decreased since admission. 3. Chronic lung disease with traction bronchiectasis on recent chest CT. Chronicity of residual interstitial opacities is uncertain. Electronically Signed By: Marnee Spring M.D. On: 05/26/2015 07:38     Assessment/Plan: Diagnosis: debility and encephalopathy due to multiple medical issues above 1. Does the need for close, 24 hr/day medical supervision in concert with the patient's rehab needs make it unreasonable for this patient to be served in a less intensive setting? Yes 2. Co-Morbidities requiring supervision/potential complications: ARDS, COPD, malnutrition 3. Due to bladder management, bowel management, safety, skin/wound care, disease management, medication administration and patient education, does the patient require 24 hr/day rehab nursing? Yes 4. Does the patient require coordinated care of a physician, rehab nurse, PT (1-2 hrs/day, 5 days/week), OT (1-2 hrs/day, 5 days/week) and SLP (1-2 hrs/day, 5 days/week) to address physical and functional deficits in the context of the above medical  diagnosis(es)? Yes Addressing deficits in the following areas: balance, endurance, locomotion, strength, transferring, bowel/bladder control, bathing, dressing, feeding, grooming, toileting, cognition, swallowing and psychosocial support 5. Can the patient actively participate in an intensive therapy program of at least 3 hrs of therapy per day at least 5 days per week? Yes 6. The potential for patient to make measurable gains while on inpatient rehab is excellent 7. Anticipated functional outcomes upon discharge from inpatient  rehab are modified independent and supervision with PT, modified independent and supervision with OT, modified independent and supervision with SLP. 8. Estimated rehab length of stay to reach the above functional goals is: 11-17 days 9. Does the patient have adequate social supports and living environment to accommodate these discharge functional goals? Yes 10. Anticipated D/C setting: Home 11. Anticipated post D/C treatments: HH therapy and Outpatient therapy 12. Overall Rehab/Functional Prognosis: excellent  RECOMMENDATIONS: This patient's condition is appropriate for continued rehabilitative care in the following setting: CIR Patient has agreed to participate in recommended program. Yes Note that insurance prior authorization may be required for reimbursement for recommended care.  Comment: Rehab Admissions Coordinator to follow up.  Thanks,  Ranelle Oyster, MD, [facility]     05/27/2015       Revision History     Date/Time User Provider Type Action   05/28/2015 9:10 AM Ranelle Oyster, MD Physician Sign   05/27/2015 2:00 PM Charlton Amor, PA-C Physician Assistant Pend   View Details Report       Routing History

## 2015-05-30 NOTE — Discharge Summary (Signed)
DISCHARGE SUMMARY  Julia Massey  MR#: [id number]  DOB:06/16/1950  Date of Admission: 05/14/2015 Date of Discharge: 05/30/2015  Attending Physician:Mykal Kirchman T  Patient's PCP:No primary care provider on file.  Consults: Rehab  PCCM   Disposition: D/C to CIR   Discharge Diagnoses: Atypical PNA vs post viral pneumonitis > ARDS w/ acute resp failure  Acute pulmonary edema - Elevated BNP Pulm HTN HLD Hypernatremia  Non-severe (moderate) malnutrition in context of chronic illness Metabolic alkolosis from diuresis ?Relative adrenal insufficiency Hx anxiety / depression Acute encephalopathy - resolved Deconditioned  Chest wall pain  D/C diet: D2 w/ nectar thick liquids  Initial presentation: 65 y.o. female with Hx of HLD, anxiety, depression, and COPD who began to feel sick around Christmas with cough productive of thick green sputum and fevers (Tmax 102) / chills. She saw her PCP who placed her on a z- pack. She apparently was initially feeling somewhat better, then began to have worsening SOB again. She continued to have cough productive of thick green sputum and had some mild chest pain that was mostly associated with coughing.   On 1/04 she went to [facility] where she was found to be hypoxic to the point that she required BiPAP (was 73% on RA). Trial off BiPAP was attempted, but she failed. CTA was obtained which revealed extensive bilateral patchy infiltrates c/w atypical PNA. Additional labs noteable for WBC 20.8 (85% neut's), K 3.2, BNP 1252, Trop 0.039, lactic acid 2.5. Flu PCR was negative.  Hospital Course:  Significant Events 1/04 admit with acute hypoxic respiratory failure and concern for atypical PNA 1/05 distress on bipap > intubated, bronchoscopy, a-line and central line placed 1/5 TTE - EF 55-60% - no WMA - grade 1 DD - severely reduced RV systolic fxn - PA presure 24mm Hg 1/16 extubated  1/18 [facility] assumed care  1/20 D/C  to CIR   Atypical PNA vs post viral pneumonitis > ARDS w/ acute resp failure  Essentially resolved at this time - O2 sats have been in the mid to upper 90s w/o oxygen support - she is ready for rehab   Acute pulmonary edema - Elevated BNP ?pulmonary edema secondary to IVF received in ICU - resolved - diuretics now discontinued - appears euvolemic  Pulm HTN pa 52 > due to hypoxemia - follow up as outpatient with stabilization of respiratory status - expect this will resolve  Non-severe (moderate) malnutrition in context of chronic illness MBS on 1/18 cleared pt for D2 nectar diet   HLD Continue crestor  Hypernatremia  Resolved   Metabolic alkolosis from diuresis improved after Lasix stopped and Diamox dosed 1/16 - now resolved   ?Relative adrenal insufficiency Cortisol was not checked - now off steroid - no clinical indication of ongoing adrenal issues   Hx anxiety / depression Appears well compensated at present  Acute encephalopathy - resolved  Deconditioned  Cleared for d/c to CIR  Chest wall pain Pt noted left-sided chest wall pain when she awoke this morning - she describes this as a constant tearing type pain which is much worse with movement or cough - this pain is reproducible on palpation of the chest wall with a specific point of tenderness appreciated - she has absolutely no symptoms to suggest substernal chest pressure/unstable angina - she is not dyspneic with exertion - this appears most consistent with a musculoskeletal injury and I do not feel that it should prohibit her from proceeding with inpatient rehabilitation placement  D/C Medications: To  be determined at time pt is D/C from CIR  Day of Discharge BP 105/61 mmHg  Pulse 92  Temp(Src) 98.1 F (36.7 C) (Oral)  Resp 18  Ht 5' (1.524 m)  Wt 47.5 kg (104 lb 11.5 oz)  BMI 20.45 kg/m2  SpO2 96%  LMP  (LMP Unknown)  Physical Exam: General: No acute respiratory distress Lungs: Clear to  auscultation bilaterally without wheezes or crackles Cardiovascular: Regular rate and rhythm without murmur gallop or rub normal S1 and S2 Abdomen: Nontender, nondistended, soft, bowel sounds positive, no rebound, no ascites, no appreciable mass Extremities: No significant cyanosis, clubbing, or edema bilateral lower extremities  Basic Metabolic Panel:  Recent Labs Lab 05/23/15 1907  05/24/15 1827  05/25/15 0725 05/26/15 0500 05/27/15 0312 05/28/15 0718 05/29/15 0240 05/30/15 0610  NA  --   < >  --   < >  --  145 144 143 145 141  K  --   < >  --   < >  --  3.1* 3.2* 4.1 4.1 4.2  CL  --   < >  --   < >  --  96* 95* 102 106 106  CO2  --   < >  --   < >  --  40* 33* 31 28 23   GLUCOSE  --   < >  --   < >  --  159* 119* 132* 123* 115*  BUN  --   < >  --   < >  --  54* 62* 58* 35* 22*  CREATININE  --   < >  --   < >  --  0.50 0.50 0.63 0.54 0.57  CALCIUM  --   < >  --   < >  --  9.5 10.3 10.2 9.9 9.6  MG 2.0  < > 2.1  --  2.3 2.4 2.8*  --   --  2.1  PHOS 3.3  --   --   --  4.4 3.9 5.0*  --   --   --   < > = values in this interval not displayed.  Liver Function Tests:  Recent Labs Lab 05/29/15 0240  AST 25  ALT 21  ALKPHOS 57  BILITOT 0.8  PROT 7.5  ALBUMIN 3.4*   CBC:  Recent Labs Lab 05/26/15 0500 05/27/15 0312 05/28/15 0718 05/29/15 0240 05/30/15 0610  WBC 14.9* 17.6* 15.1* 13.2* 13.6*  NEUTROABS  --   --   --   --  8.6*  HGB 11.7* 13.0 14.4 13.9 13.7  HCT 38.8 42.6 45.5 45.0 42.8  MCV 95.8 96.6 94.8 94.5 92.8  PLT 128* 170 220 266 284    CBG:  Recent Labs Lab 05/28/15 0017 05/28/15 0432 05/28/15 0805 05/28/15 1156 05/28/15 1627  GLUCAP 110* 112* 119* 138* 153*    Recent Results (from the past 240 hour(s))  Culture, respiratory (NON-Expectorated)     Status: None   Collection Time: 05/21/15 11:26 AM  Result Value Ref Range Status   Specimen Description TRACHEAL ASPIRATE  Final   Special Requests Normal  Final   Gram Stain   Final    FEW WBC  PRESENT,BOTH PMN AND MONONUCLEAR RARE SQUAMOUS EPITHELIAL CELLS PRESENT RARE YEAST Performed at 07/19/15    Culture   Final    MODERATE YEAST CONSISTENT WITH CANDIDA SPECIES Performed at [employer]    Report Status 05/24/2015 FINAL  Final  Culture, blood (Routine X 2) w  Reflex to ID Panel     Status: None   Collection Time: 05/21/15 12:25 PM  Result Value Ref Range Status   Specimen Description BLOOD RIGHT ARM  Final   Special Requests BOTTLES DRAWN AEROBIC AND ANAEROBIC 5CC  Final   Culture NO GROWTH 5 DAYS  Final   Report Status 05/26/2015 FINAL  Final  Culture, blood (Routine X 2) w Reflex to ID Panel     Status: None   Collection Time: 05/21/15 12:33 PM  Result Value Ref Range Status   Specimen Description BLOOD RIGHT HAND  Final   Special Requests IN PEDIATRIC BOTTLE 3CC  Final   Culture NO GROWTH 5 DAYS  Final   Report Status 05/26/2015 FINAL  Final      Time spent in discharge (includes decision making & examination of pt): >30 minutes  05/30/2015, 2:16 PM   Lonia Blood, MD [facility] Office  [phone] Pager [phone]  On-Call/Text Page:      [email or web address]      password [facility]

## 2015-05-31 ENCOUNTER — Inpatient Hospital Stay (HOSPITAL_COMMUNITY): Payer: BLUE CROSS/BLUE SHIELD | Admitting: Physical Therapy

## 2015-05-31 ENCOUNTER — Inpatient Hospital Stay (HOSPITAL_COMMUNITY): Payer: BLUE CROSS/BLUE SHIELD | Admitting: Speech Pathology

## 2015-05-31 ENCOUNTER — Inpatient Hospital Stay (HOSPITAL_COMMUNITY): Payer: BLUE CROSS/BLUE SHIELD | Admitting: Occupational Therapy

## 2015-05-31 DIAGNOSIS — G9340 Encephalopathy, unspecified: Secondary | ICD-10-CM

## 2015-05-31 DIAGNOSIS — I5032 Chronic diastolic (congestive) heart failure: Secondary | ICD-10-CM

## 2015-05-31 DIAGNOSIS — F411 Generalized anxiety disorder: Secondary | ICD-10-CM

## 2015-05-31 DIAGNOSIS — J441 Chronic obstructive pulmonary disease with (acute) exacerbation: Secondary | ICD-10-CM

## 2015-05-31 NOTE — Evaluation (Signed)
Physical Therapy Assessment and Plan  Patient Details  Name: Julia Massey MRN: [id number] Date of Birth: [birth year]  PT Diagnosis: Difficulty walking, Impaired cognition and Muscle weakness Rehab Potential: Fair ELOS: 10 to 12   Today's Date: 05/31/2015 PT Individual Time: 0800-0900 PT Individual Time Calculation (min): 60 min    Problem List:  Patient Active Problem List   Diagnosis Date Noted  . Encephalopathy 05/30/2015  . Atypical pneumonia   . Acute pulmonary edema (HCC)   . Chronic diastolic CHF (congestive heart failure) ([place])   . Pulmonary hypertension ([place])   . HLD (hyperlipidemia)   . Hypernatremia   . Anxiety state   . Depression   . Acute encephalopathy   . Acute respiratory failure with hypoxemia ([place])   . Altered mental status   . COPD exacerbation ([place])   . Encephalopathy acute   . ARDS (adult respiratory distress syndrome) ([place])   . Encounter for orogastric (OG) tube placement   . Respiratory failure ([place])   . Acute respiratory failure with hypoxia ([place]) 05/14/2015  . Malnutrition of moderate degree 05/14/2015    Past Medical History: No past medical history on file. Past Surgical History: No past surgical history on file.  Assessment & Plan Clinical Impression: Patient is a 65 y.o. right handed female with history of tobacco abuse ,COPD . Patient lives with spouse independent prior to admission. 2 level home with bedroom upstairs. Presented 05/15/2015 with productive cough low-grade fever and chills. Recently placed on a Z-Pak by her PCP. Found to be hypoxic 73% on room air. CT of the chest revealed extensive bilateral patchy infiltrates atypical pneumonia. No evidence of pulmonary emboli. White blood cell count 20,800, lactic acid 2.5. Potassium 3.2. Placed on broad-spectrum antibiotic. Patient required intubation. Echocardiogram with ejection fraction of 02% grade 1 diastolic dysfunction. Blood cultures no growth. Bouts of altered mental status. EEG  negative for seizure. Cranial CT scan negative. Extubated 05/26/2015 and follow per critical care. Subcutaneous heparin for DVT prophylaxis. Swallow study completed 05/28/2015 and diet advanced to dysphagia #2 nectar thick liquids.   Patient transferred to CIR on 05/30/2015 .    Patient currently requires min with mobility secondary to muscle weakness, decreased cardiorespiratoy endurance and decreased safety awareness.  Prior to hospitalization, patient was independent  with mobility and lived with Daughter, Spouse in a House home.  Home access is 1 through garageStairs to enter.  Patient will benefit from skilled PT intervention to maximize safe functional mobility, minimize fall risk and decrease caregiver burden for planned discharge home with intermittent assist.  Anticipate patient will benefit from follow up [facility] at discharge.  PT - End of Session Activity Tolerance: Tolerates 30+ min activity with multiple rests Endurance Deficit: Yes PT Assessment Rehab Potential (ACUTE/IP ONLY): Fair Barriers to Discharge: Decreased caregiver support;Inaccessible home environment PT Patient demonstrates impairments in the following area(s): Balance;Endurance;Safety PT Transfers Functional Problem(s): Bed Mobility;Bed to Chair;Car PT Locomotion Functional Problem(s): Ambulation;Wheelchair Mobility;Stairs PT Plan PT Intensity: Minimum of 1-2 x/day ,45 to 90 minutes PT Frequency: 5 out of 7 days PT Duration Estimated Length of Stay: 10 to 12 PT Treatment/Interventions: Ambulation/gait training;Community reintegration;Discharge planning;Functional mobility training;DME/adaptive equipment instruction;Neuromuscular re-education;Patient/family education;Splinting/orthotics;Stair training;UE/LE Strength taining/ROM;Wheelchair propulsion/positioning;UE/LE Coordination activities;Therapeutic Activities;Therapeutic Exercise PT Transfers Anticipated Outcome(s): mod I for transfers PT Locomotion Anticipated  Outcome(s): mod I gait, stairs and w/c mobility PT Recommendation Follow Up Recommendations: Home health PT Patient destination: Home Equipment Recommended: To be determined  Skilled Therapeutic Intervention PT evaluation completed and treatment plan  initiated. During car transfers pt c/o muscular pain over left chest during activity. O2 sat following transfer was in the mid 80s. Pt encouraged to perform purse lip breathing and O2 sat returned to 90-91%. BP 98/64 with HR 99. Pt ambulated about 18 feet with min A, O2 sat following activity was mid 80's, pt encouraged to perform purse lip breathing and O2 sat returned to 90%. Pt attempted stairs following prolonged rest break, c/o increased work of breathing, following stairs O2 sat 85%, again encouraged to perform purse lip breathing and O2 sat returned to 91%. Pt returned to room and notified nurse of decreased O2 sats with activity. Nurse to discuss with MD about O2 order. Pt returned to room. Pt ambulated about 10 feet to recliner with min A. Pt left sitting up in recliner with call bell within reach.   PT Evaluation Precautions/Restrictions Precautions Precautions: Fall Restrictions Weight Bearing Restrictions: No General Chart Reviewed: Yes Family/Caregiver Present: No  Pain No c/o pain.  Home Living/Prior Functioning Home Living Available Help at Discharge: Family;Available 24 hours/day Type of Home: House Home Access: Stairs to enter [employer] of Steps: 1 through garage Entrance Stairs-Rails: None Home Layout: Two level;Bed/bath upstairs Alternate Level Stairs-Number of Steps: 1 flight Alternate Level Stairs-Rails: Right;Left Prior Function Level of Independence: Independent with transfers;Independent with gait  Able to Take Stairs?: Yes Driving: Yes Vocation: Retired Vision/Perception  As per OT evaluation.   Cognition Overall Cognitive Status: Impaired/Different from baseline Arousal/Alertness:  Awake/alert Orientation Level:  (generally oriented) Memory: Appears intact Awareness: Appears intact Problem Solving: Impaired Behaviors: Impulsive Safety/Judgment: Impaired Sensation Sensation Light Touch: Appears Intact Coordination Gross Motor Movements are Fluid and Coordinated: Yes Motor  Motor Motor: Within Functional Limits  Mobility Bed Mobility Bed Mobility: Rolling Right;Rolling Left;Supine to Sit;Sit to Supine Rolling Right: 5: Supervision Rolling Left: 5: Supervision Supine to Sit: 4: Min assist Sit to Supine: 5: Supervision Transfers Transfers: Yes Sit to Stand: 4: Min assist Stand to Sit: 4: Min assist Stand Pivot Transfers: 4: Min assist Locomotion  Ambulation Ambulation: Yes Ambulation/Gait Assistance: 4: Min Wellsite geologist (Feet): 18 Feet Stairs / Additional Locomotion Stairs: Yes Stairs Assistance: 4: Min assist Stair Management Technique: Two rails Number of Stairs: 4 Height of Stairs: 6 Wheelchair Mobility Wheelchair Mobility: Yes Wheelchair Assistance: 4: Energy manager: Both upper extremities Distance: 30  Trunk/Postural Assessment  Cervical Assessment Cervical Assessment: Within Scientist, physiological Assessment: Within Functional Limits Lumbar Assessment Lumbar Assessment: Within Functional Limits Postural Control Postural Control: Deficits on evaluation  Balance Balance Balance Assessed: Yes Static Sitting Balance Static Sitting - Balance Support: Feet supported Static Sitting - Level of Assistance: 5: Stand by assistance Dynamic Sitting Balance Dynamic Sitting - Balance Support: Feet supported;During functional activity Dynamic Standing Balance Dynamic Standing - Balance Support: During functional activity Dynamic Standing - Level of Assistance: 4: Min assist Extremity Assessment  RUE Assessment RUE Assessment: Within Functional Limits LUE Assessment LUE Assessment:  Within Functional Limits RLE Assessment RLE Assessment: Within Functional Limits LLE Assessment LLE Assessment: Within Functional Limits   See Function Navigator for Current Functional Status.   Refer to Care Plan for Long Term Goals  Recommendations for other services: None  Discharge Criteria: Patient will be discharged from PT if patient refuses treatment 3 consecutive times without medical reason, if treatment goals not met, if there is a change in medical status, if patient makes no progress towards goals or if patient is discharged from hospital.  The above  assessment, treatment plan, treatment alternatives and goals were discussed and mutually agreed upon: by patient  Dub Amis 05/31/2015, 4:06 PM

## 2015-05-31 NOTE — Progress Notes (Signed)
1000 reported by physical therapist that pt desaturated to 88-90 % While in  Therapy room with some sob , Kept pt comfofrtable in chair with high backrest  Rested thereafter . O2 sat remained @ 92-98# no apparent distress

## 2015-05-31 NOTE — Progress Notes (Signed)
Patient arrived on floor at 1830 from 6E. Received by day RN.

## 2015-05-31 NOTE — Evaluation (Signed)
Occupational Therapy Assessment and Plan  Patient Details  Name: Julia Massey MRN: [id number] Date of Birth: [birth year]  OT Diagnosis: cognitive deficits and muscle weakness (generalized) Rehab Potential:  good  ELOS:   10-12 days  Today's Date: 05/31/2015 OT Individual Time: 1300-1415 OT Individual Time Calculation (min): 75 min     Problem List:  Patient Active Problem List   Diagnosis Date Noted  . Encephalopathy 05/30/2015  . Atypical pneumonia   . Acute pulmonary edema (HCC)   . Chronic diastolic CHF (congestive heart failure) ([place])   . Pulmonary hypertension ([place])   . HLD (hyperlipidemia)   . Hypernatremia   . Anxiety state   . Depression   . Acute encephalopathy   . Acute respiratory failure with hypoxemia ([place])   . Altered mental status   . COPD exacerbation ([place])   . Encephalopathy acute   . ARDS (adult respiratory distress syndrome) ([place])   . Encounter for orogastric (OG) tube placement   . Respiratory failure ([place])   . Acute respiratory failure with hypoxia ([place]) 05/14/2015  . Malnutrition of moderate degree 05/14/2015    Past Medical History: No past medical history on file. Past Surgical History: No past surgical history on file.  Assessment & Plan Clinical Impression: history of tobacco abuse ,COPD . Patient lives with spouse independent prior to admission. 2 level home with bedroom upstairs. Presented 05/15/2015 with productive cough low-grade fever and chills. Recently placed on a Z-Pak by her PCP. Found to be hypoxic 73% on room air. CT of the chest revealed extensive bilateral patchy infiltrates atypical pneumonia. No evidence of pulmonary emboli. White blood cell count 20,800, lactic acid 2.5. Potassium 3.2. Placed on broad-spectrum antibiotic. Patient required intubation. Echocardiogram with ejection fraction of 38% grade 1 diastolic dysfunction. Blood cultures no growth. Bouts of altered mental status. EEG negative for seizure. Cranial CT scan negative.  Extubated 05/26/2015 and follow per critical care. Subcutaneous heparin for DVT prophylaxis. Swallow study completed 05/28/2015 and diet advanced to dysphagia #2 nectar thick liquids..  Patient transferred to CIR on 05/30/2015 .    Patient currently requires mod with basic self-care skills secondary to muscle weakness, decreased cardiorespiratoy endurance and decreased problem solving and decreased safety awareness.  Prior to hospitalization, patient could complete BADL with independent .  Patient will benefit from skilled intervention to increase independence with basic self-care skills and increase level of independence with iADL prior to discharge home with care partner.  Anticipate patient will require 24 hour supervision and follow up home health.  OT - End of Session Activity Tolerance: Tolerates 10 - 20 min activity with multiple rests Endurance Deficit: Yes   Skilled Therapeutic Intervention Focus of treatment was balance, safety awareness, functional mobility, transfers, problem solving.  Pt.. Ambulated from bed to bathroom with RW and min assist.  Need cues for safety and balance.  She followed one step directions with close supervision.  Explained OT POC and ELOS.  .    OT Evaluation Precautions/Restrictions  Precautions Precautions: Fall Restrictions Weight Bearing Restrictions: No      Pain  none   Home Living/Prior Functioning Home Living Available Help at Discharge: Family, Available 24 hours/day Type of Home: House Home Access: Stairs to enter [employer] of Steps: 1 through garage Entrance Stairs-Rails: None Home Layout: Two level, Bed/bath upstairs Alternate Level Stairs-Number of Steps: 1 flight Alternate Level Stairs-Rails: Right, Left Bathroom Shower/Tub: Tub/shower unit, Door [employer] Toilet: Standard Bathroom Accessibility: Yes Additional Comments: pt showers in tub/shower with  HH hose; has garden tub  Lives With: Daughter, Spouse IADL  History Homemaking Responsibilities: Yes Meal Prep Responsibility: Primary Laundry Responsibility: Primary Cleaning Responsibility: Secondary Bill Paying/Finance Responsibility: Primary Shopping Responsibility: Primary Child Care Responsibility: Primary Current License: Yes Mode of Transportation: Car Prior Function Level of Independence: Independent with transfers, Independent with gait  Able to Take Stairs?: Yes Driving: Yes Vocation: Retired Leisure: Hobbies-yes (Comment) (gardening, cooking, grandchildren)    Vision/Perception  Vision- Assessment Vision Assessment?: No apparent visual deficits  Cognition Overall Cognitive Status: Impaired/Different from baseline Arousal/Alertness: Awake/alert Year: 2017 Month: January Day of Week: Correct Memory: Appears intact Immediate Memory Recall: Sock;Blue;Bed Memory Recall: Sock;Blue;Bed Memory Recall Sock: With Cue Memory Recall Blue: Without Cue Memory Recall Bed: With Cue Attention: Selective Selective Attention: Appears intact Awareness: Appears intact Problem Solving: Impaired Behaviors: Impulsive Safety/Judgment: Impaired Sensation Sensation Light Touch: Appears Intact Coordination Gross Motor Movements are Fluid and Coordinated: Yes Motor  Motor Motor: Within Functional Limits Mobility  Bed Mobility Bed Mobility: Rolling Right;Rolling Left;Supine to Sit;Sit to Supine Rolling Right: 5: Supervision Rolling Left: 5: Supervision Supine to Sit: 4: Min assist Sit to Supine: 5: Supervision Transfers Sit to Stand: 4: Min assist Stand to Sit: 4: Min assist  Trunk/Postural Assessment  Cervical Assessment Cervical Assessment: Within Functional Limits Thoracic Assessment Thoracic Assessment: Within Functional Limits Lumbar Assessment Lumbar Assessment: Within Functional Limits Postural Control Postural Control: Deficits on evaluation  Balance Static Sitting Balance Static Sitting - Balance Support: Feet  supported Static Sitting - Level of Assistance: 5: Stand by assistance Dynamic Standing Balance Dynamic Standing - Balance Support: During functional activity Dynamic Standing - Level of Assistance: 4: Min assist Extremity/Trunk Assessment RUE Assessment RUE Assessment: Within Functional Limits LUE Assessment LUE Assessment: Within Functional Limits   See Function Navigator for Current Functional Status.   Refer to Care Plan for Long Term Goals  Recommendations for other services: None  Discharge Criteria: Patient will be discharged from OT if patient refuses treatment 3 consecutive times without medical reason, if treatment goals not met, if there is a change in medical status, if patient makes no progress towards goals or if patient is discharged from hospital.  The above assessment, treatment plan, treatment alternatives and goals were discussed and mutually agreed upon: by patient  Lisa Roca 05/31/2015, 7:12 PM

## 2015-05-31 NOTE — Progress Notes (Signed)
Nutrition Follow-up  DOCUMENTATION CODES:  Non-severe (moderate) malnutrition in context of chronic illness    INTERVENTION:  Continue Magic cup TID with meals, each supplement provides 290 kcal and 9 grams of protein  Food preferences obtained noted below.  NEW NUTRITION DIAGNOSIS:  Predicted suboptimal nutrient intake related to dysphagia as evidenced by  (objective swallow evaluation),ongoing  GOAL:  Patient will meet greater than or equal to 90% of their needs, currently unmet  MONITOR:  PO intake, Supplement acceptance, Labs, Weight trends, I & O's    ASSESSMENT:  Pt extubated 1/16.  TF discontinued.  S/p objective swallow evaluation 1/18.  Pt with moderate pharyngeal phase dysphagia. Advanced to Dys 2, nectar thick liquid diet. Predict intake to be suboptimal.  Will add oral nutrition supplements.  Plan is for pt to transfer to IP Rehab once insurance authorizes  Pt transferred now to [facility]. RD will continue to follow given her recent  Malnutrition. Her po intake is improving slowly -100% of breakfast this morning. She is c/o the thickened liquids and wants to know when her diet will be upgraded. Food preferences obtained: she likes the (orange) flavor magic cups and would like to add chocolate puddng and cottage cheese and fruit. Discussed with nutrition services.  Labs: glucose 152 (high), WBC 13.2 elevated, BUN improved now to normal-18  Diet Order:  DIET DYS 2 Room service appropriate?: Yes; Fluid consistency:: Nectar Thick  Skin:   remains intact  Last BM:   1/20-large brown loose stool  Height:   Ht Readings from Last 1 Encounters:  05/30/15 5' (1.524 m)    Weight:   Wt Readings from Last 1 Encounters:  05/30/15 107 lb 1.6 oz (48.58 kg)  1/19-104#  Ideal Body Weight:   45.5 kg  BMI:  Body mass index is 20.92 kg/(m^2).  Estimated Nutritional Needs:   Kcal:   1400-1600 Protein: 67 gr Fluid:   1.4-1.6 liters daily  EDUCATION NEEDS: none  identified   Royann Shivers MS,RD,CSG,LDN Office: [phone] Pager: [phone]

## 2015-05-31 NOTE — Evaluation (Signed)
Speech Language Pathology Assessment and Plan  Patient Details  Name: Julia Massey MRN: [id number] Date of Birth: 04-Aug-1950  SLP Diagnosis: Cognitive Impairments;Dysphagia  Rehab Potential: Good ELOS: 10-12 days     Today's Date: 05/31/2015 SLP Individual Time: 1030-1130 SLP Individual Time Calculation (min): 60 min   Problem List:  Patient Active Problem List   Diagnosis Date Noted  . Encephalopathy 05/30/2015  . Atypical pneumonia   . Acute pulmonary edema (HCC)   . Chronic diastolic CHF (congestive heart failure) ([place])   . Pulmonary hypertension ([place])   . HLD (hyperlipidemia)   . Hypernatremia   . Anxiety state   . Depression   . Acute encephalopathy   . Acute respiratory failure with hypoxemia ([place])   . Altered mental status   . COPD exacerbation ([place])   . Encephalopathy acute   . ARDS (adult respiratory distress syndrome) ([place])   . Encounter for orogastric (OG) tube placement   . Respiratory failure ([place])   . Acute respiratory failure with hypoxia ([place]) 05/14/2015  . Malnutrition of moderate degree 05/14/2015   Past Medical History: No past medical history on file. Past Surgical History: No past surgical history on file.  Assessment / Plan / Recommendation Clinical Impression Julia Massey is a 65 y.o. right handed female with history of tobacco abuse, COPD. Patient lives with spouse and was independent prior to admission. They lived in a 2 level home with bedroom upstairs. She presented 05/15/2015 with productive cough low-grade fever and chills. Recently placed on a Z-Pak by her PCP. Found to be hypoxic 73% on room air. CT of the chest revealed extensive bilateral patchy infiltrates atypical pneumonia. No evidence of pulmonary emboli. White blood cell count 20,800, lactic acid 2.5. Potassium 3.2. Placed on broad-spectrum antibiotic. Patient required intubation. Echocardiogram with ejection fraction of 67% grade 1 diastolic dysfunction. Blood cultures no growth.  Bouts of altered mental status. EEG negative for seizure. Cranial CT scan negative. Extubated 05/26/2015 and followed by critical care. Subcutaneous heparin for DVT prophylaxis. Physical therapy evaluation completed 05/27/2015 with recommendations of physical medicine rehabilitation consult.  Patient was admitted to [facility] 05/30/15 and demonstrates moderate cognitive impairments characterized by poor sustained attention that impacts all higher level cognitive abilities as well.  This impact's the patient's overall safety with basic, functional self-care tasks. Patient also demonstrates restlessness and impulsivity.    Per objective assessment patient with a multifactor dysphagia due to reduce base of tongue strength as well as likely presbyesophagus.  Chin tuck was found to be an effective strategy to protect airway at this time.  Bedside trials resulted in throat clears with thin liquids.  Trials of puree and nectar-thick liquids remain WFL with use of safe swallow strategies with no overt s/s of aspiration.     Patient would benefit from skilled SLP intervention in order to maximize her functional independence prior to discharge. Anticipate patient will require 24 hour supervision at home and follow up SLP services.    Skilled Therapeutic Interventions          Bedside swallow and cognitive-linguistic evaluations completed with results and recommendations reviewed with patient.  Montreal Cognitive Assessment initiated; however, limited by patient not having contacts and need for Max assist verbal cue for redirection due to poor sustained attention.       SLP Assessment  Patient will need skilled Speech Lanaguage Pathology Services during CIR admission    Recommendations  SLP Diet Recommendations: Nectar Liquid Administration via: Cup;No straw Medication  Administration: Whole meds with liquid Supervision: Patient able to self feed;Full supervision/cueing for compensatory  strategies Compensations: Chin tuck;Slow rate;Small sips/bites Oral Care Recommendations: Oral care BID Patient destination: Home Follow up Recommendations: 24 hour supervision/assistance;Outpatient SLP Equipment Recommended: None recommended by SLP    SLP Frequency 3 to 5 out of 7 days   SLP Duration  SLP Intensity  SLP Treatment/Interventions 10-12 days   Minumum of 1-2 x/day, 30 to 90 minutes  Cueing hierarchy;Cognitive remediation/compensation;Dysphagia/aspiration precaution training;Environmental controls;Functional tasks;Internal/external aids;Patient/family education;Therapeutic Activities    Pain Pain Assessment Pain Assessment: No/denies pain  Prior Functioning Cognitive/Linguistic Baseline: Within functional limits (per patient report) Type of Home: House  Lives With: Daughter;Spouse Available Help at Discharge: Family;Available 24 hours/day Vocation: Retired  Function:  Eating Eating   Modified Consistency Diet: Yes Eating Assist Level: Supervision or verbal cues           Cognition Comprehension Comprehension assist level: Understands basic 90% of the time/cues < 10% of the time  Expression   Expression assist level: Expresses basic 90% of the time/requires cueing < 10% of the time.  Social Interaction Social Interaction assist level: Interacts appropriately 90% of the time - Needs monitoring or encouragement for participation or interaction.  Problem Solving Problem solving assist level: Solves basic 75 - 89% of the time/requires cueing 10 - 24% of the time  Memory Memory assist level: Recognizes or recalls 75 - 89% of the time/requires cueing 10 - 24% of the time   Short Term Goals: Week 1: SLP Short Term Goal 1 (Week 1): Pt will complete base of tongue strengthening exercise with Mod assist multimodal cues for accuracy SLP Short Term Goal 2 (Week 1): Pt will utilize chin tuck during PO intake to prevent overt s/s of aspiraiton with Min verbal cues   SLP Short Term Goal 3 (Week 1): Pt will demonstrate basic problem solving during functional and self-care tasks with Min verbal cues SLP Short Term Goal 4 (Week 1): Pt will sustain attention to tasks for 8-10 minutes with Min verbal cues for redirection SLP Short Term Goal 5 (Week 1): Pt will utilize external aids to assist with recall of new information with Min assist question cues SLP Short Term Goal 6 (Week 1): Pt will request help as needed during completion of basic tasks with Min question cues  Refer to Care Plan for Long Term Goals  Recommendations for other services: None  Discharge Criteria: Patient will be discharged from SLP if patient refuses treatment 3 consecutive times without medical reason, if treatment goals not met, if there is a change in medical status, if patient makes no progress towards goals or if patient is discharged from hospital.  The above assessment, treatment plan, treatment alternatives and goals were discussed and mutually agreed upon: by patient  Gunnar Fusi, M.A., CCC-SLP [phone]  [place] 05/31/2015, 10:07 PM

## 2015-05-31 NOTE — Progress Notes (Signed)
[place] PHYSICAL MEDICINE & REHABILITATION     PROGRESS NOTE    Subjective/Complaints: Did well last night. Chest wall feeling better. Ice really helps. Excited to start therapies today.   ROS: Pt denies fever, rash/itching, headache, blurred or double vision, nausea, vomiting, abdominal pain, diarrhea, chest pain, shortness of breath, palpitations, dysuria, dizziness,   bleeding,   or depression. Mild anxiety   Objective: Vital Signs: Blood pressure 99/67, pulse 80, temperature 98.1 F (36.7 C), temperature source Oral, resp. rate 17, height 5' (1.524 m), weight 48.58 kg (107 lb 1.6 oz), SpO2 96 %. No results found.  Recent Labs  05/30/15 0610 05/30/15 2100  WBC 13.6* 13.2*  HGB 13.7 13.2  HCT 42.8 41.3  PLT 284 245    Recent Labs  05/30/15 0610 05/30/15 2100  NA 141 138  K 4.2 4.3  CL 106 102  GLUCOSE 115* 152*  BUN 22* 18  CREATININE 0.57 0.60  CALCIUM 9.6 9.4   CBG (last 3)   Recent Labs  05/28/15 1156 05/28/15 1627  GLUCAP 138* 153*    Wt Readings from Last 3 Encounters:  05/30/15 48.58 kg (107 lb 1.6 oz)  05/29/15 47.5 kg (104 lb 11.5 oz)    Physical Exam:  Constitutional:  Frail appearing. No distress  HENT:  Head: Normocephalic and atraumatic.  Eyes: Conjunctivae and EOM are normal. Pupils are equal, round, and reactive to light.  Neck: Normal range of motion. Neck supple. No tracheal deviation present. No thyromegaly present.  Cardiovascular: Regular rhythm.  HR 80's Respiratory: She has no wheezes. She has no rales.    good inspiratory effort. Chest tender to palpation along left ribs 4-6 but improved today GI: Soft. She exhibits no distension.  Neurological: She is alert. No cranial nerve deficit. Coordination normal.  Makes good eye contact with examiner. Oriented to month and year, place. Improved awareness and insight. Answered biographical questions. No sensory deficits. MMT remains: UE 3/5 delt,bicep,wrist, tricep, 4/5  HI. LE: 3/5 HF, KE and 4/5 ADF/APF.  Skin: Skin is warm and dry.  Psychiatric:  Pleasant and cooperative.   Assessment/Plan: 1. Debilitation/ encephalopathy secondary to acute respiratory failure and multiple medical which require 3+ hours per day of interdisciplinary therapy in a comprehensive inpatient rehab setting. Physiatrist is providing close team supervision and 24 hour management of active medical problems listed below. Physiatrist and rehab team continue to assess barriers to discharge/monitor patient progress toward functional and medical goals.  Function:  Bathing Bathing position      Bathing parts      Bathing assist        Upper Body Dressing/Undressing Upper body dressing                    Upper body assist        Lower Body Dressing/Undressing Lower body dressing                                  Lower body assist        Toileting Toileting          Toileting assist     Transfers Chair/bed transfer             Locomotion Ambulation           Wheelchair          Cognition Comprehension Comprehension assist level: Understands basic 90% of the time/cues < 10%  of the time  Expression    Social Interaction Social Interaction assist level: Interacts appropriately 90% of the time - Needs monitoring or encouragement for participation or interaction.  Problem Solving Problem solving assist level: Solves basic 75 - 89% of the time/requires cueing 10 - 24% of the time  Memory Memory assist level: Recognizes or recalls 75 - 89% of the time/requires cueing 10 - 24% of the time   Medical Problem List and Plan:  1. Debilitation with encephalopathy secondary to acute respiratory failure 2. DVT Prophylaxis/Anticoagulation: Subcutaneous heparin indicated at present 3. Pain Management/musculoskeletal chest and back pain: Tylenol as needed.   4. Mood: Continue Prozac 40 mg daily 5. Neuropsych: This patient is capable of making  decisions on her own behalf. 6. Skin/Wound Care: Routine skin checks 7. Fluids/Electrolytes/Nutrition: Routine I's and O's. Encourage PO.  8. Tobacco abuse/COPD. Provide counseling. Check oxygen saturations every shift--in 90's currently 9. Dysphagia. Dysphagia #2 nectar thick liquids. Follow-up speech therapy. Monitor hydration 10. Chest wall pain:  musculoskeletal. Ice applied, analgesics, monitor for now.  -IS   LOS (Days) 1 A FACE TO FACE EVALUATION WAS PERFORMED  Lanissa Cashen T 05/31/2015 8:58 AM

## 2015-06-01 MED ORDER — ALPRAZOLAM 0.25 MG PO TABS
0.2500 mg | ORAL_TABLET | Freq: Three times a day (TID) | ORAL | Status: DC | PRN
  Administered 2015-06-01 – 2015-06-08 (×9): 0.25 mg via ORAL
  Filled 2015-06-01 (×9): qty 1

## 2015-06-01 NOTE — Progress Notes (Signed)
[facility] PHYSICAL MEDICINE & REHABILITATION     PROGRESS NOTE    Subjective/Complaints: Felt that she didn't do as well as she should have in therapy yesterday. Oxygen levels dropped---recovered when she came back to room. Admits to anxiety, poor breathing patterns   ROS: Pt denies fever, rash/itching, headache, blurred or double vision, nausea, vomiting, abdominal pain, diarrhea, chest pain, shortness of breath, palpitations, dysuria, dizziness,   bleeding,   or depression. Mild anxiety   Objective: Vital Signs: Blood pressure 110/60, pulse 87, temperature 98.3 F (36.8 C), temperature source Oral, resp. rate 17, height 5' (1.524 m), weight 47.8 kg (105 lb 6.1 oz), SpO2 99 %. No results found.  Recent Labs  05/30/15 0610 05/30/15 2100  WBC 13.6* 13.2*  HGB 13.7 13.2  HCT 42.8 41.3  PLT 284 245    Recent Labs  05/30/15 0610 05/30/15 2100  NA 141 138  K 4.2 4.3  CL 106 102  GLUCOSE 115* 152*  BUN 22* 18  CREATININE 0.57 0.60  CALCIUM 9.6 9.4   CBG (last 3)  No results for input(s): GLUCAP in the last 72 hours.  Wt Readings from Last 3 Encounters:  06/01/15 47.8 kg (105 lb 6.1 oz)  05/29/15 47.5 kg (104 lb 11.5 oz)    Physical Exam:  Constitutional:  Frail appearing. No distress  HENT:  Head: Normocephalic and atraumatic.  Eyes: Conjunctivae and EOM are normal. Pupils are equal, round, and reactive to light.  Neck: Normal range of motion. Neck supple. No tracheal deviation present. No thyromegaly present.  Cardiovascular: Regular rhythm.  HR 80's Respiratory: She has no wheezes. She has no rales. No distress. Chest less tender. Good air movement GI: Soft. She exhibits no distension.  Neurological: She is alert. No cranial nerve deficit. Coordination normal.  Makes good eye contact with examiner. Oriented to month and year, place. Improved awareness and insight. Answered biographical questions. No sensory deficits. MMT remains: UE 3/5  delt,bicep,wrist, tricep, 4/5 HI. LE: 3/5 HF, KE and 4/5 ADF/APF.  Skin: Skin is warm and dry.  Psychiatric:  Pleasant and cooperative.   Assessment/Plan: 1. Debilitation/ encephalopathy secondary to acute respiratory failure and multiple medical which require 3+ hours per day of interdisciplinary therapy in a comprehensive inpatient rehab setting. Physiatrist is providing close team supervision and 24 hour management of active medical problems listed below. Physiatrist and rehab team continue to assess barriers to discharge/monitor patient progress toward functional and medical goals.  Function:  Bathing Bathing position   Position: Shower  Bathing parts Body parts bathed by patient: Right arm, Left arm, Chest, Abdomen, Front perineal area, Right upper leg, Buttocks, Left lower leg, Right lower leg, Left upper leg Body parts bathed by helper: Back  Bathing assist Assist Level: Touching or steadying assistance(Pt > 75%)      Upper Body Dressing/Undressing Upper body dressing   What is the patient wearing?: Bra, Pull over shirt/dress Bra - Perfomed by patient: Thread/unthread right bra strap, Thread/unthread left bra strap, Hook/unhook bra (pull down sports bra)   Pull over shirt/dress - Perfomed by patient: Thread/unthread right sleeve, Thread/unthread left sleeve, Put head through opening, Pull shirt over trunk          Upper body assist Assist Level: Supervision or verbal cues      Lower Body Dressing/Undressing Lower body dressing   What is the patient wearing?: Pants, Underwear, Non-skid slipper socks Underwear - Performed by patient: Thread/unthread right underwear leg, Thread/unthread left underwear leg, Pull underwear up/down  Pants- Performed by patient: Thread/unthread right pants leg, Thread/unthread left pants leg, Pull pants up/down     Non-skid slipper socks- Performed by helper: Don/doff right sock, Don/doff left sock                  Lower body  assist Assist for lower body dressing: Touching or steadying assistance (Pt > 75%)      Toileting Toileting   Toileting steps completed by patient: Adjust clothing prior to toileting, Performs perineal hygiene, Adjust clothing after toileting   Toileting Assistive Devices: Grab bar or rail  Toileting assist Assist level: Touching or steadying assistance (Pt.75%)   Transfers Chair/bed transfer   Chair/bed transfer method: Stand pivot Chair/bed transfer assist level: Touching or steadying assistance (Pt > 75%) Chair/bed transfer assistive device: Armrests     Locomotion Ambulation     Max distance: 18 Assist level: Touching or steadying assistance (Pt > 75%)   Wheelchair   Type: Manual Max wheelchair distance: 30 Assist Level: Touching or steadying assistance (Pt > 75%)  Cognition Comprehension Comprehension assist level: Understands basic 90% of the time/cues < 10% of the time  Expression Expression assist level: Expresses basic 90% of the time/requires cueing < 10% of the time.  Social Interaction Social Interaction assist level: Interacts appropriately 90% of the time - Needs monitoring or encouragement for participation or interaction.  Problem Solving Problem solving assist level: Solves basic 75 - 89% of the time/requires cueing 10 - 24% of the time  Memory Memory assist level: Recognizes or recalls 75 - 89% of the time/requires cueing 10 - 24% of the time   Medical Problem List and Plan:  1. Debilitation with encephalopathy secondary to acute respiratory failure  -continue CIR therapies 2. DVT Prophylaxis/Anticoagulation: Subcutaneous heparin indicated at present 3. Pain Management/musculoskeletal chest and back pain: Tylenol as needed.   4. Mood: Continue Prozac 40 mg daily  -xanax prn added 5. Neuropsych: This patient is capable of making decisions on her own behalf. 6. Skin/Wound Care: Routine skin checks 7. Fluids/Electrolytes/Nutrition: Routine I's and O's.  Encourage PO.  8. Tobacco abuse  Provide counseling.   9. Dysphagia. Dysphagia #2 nectar thick liquids. Follow-up speech therapy.   -encourage fluids  -check bmet tomorrow 10. Chest wall pain/respiratory/COPD:  musculoskeletal. Ice applied, analgesics, monitor for now.  -she is using IS  -reviewed breathing techniques and discussed anxiety impact also  -oxygen as needed   LOS (Days) 2 A FACE TO FACE EVALUATION WAS PERFORMED  Shakir Petrosino T 06/01/2015 8:33 AM

## 2015-06-02 ENCOUNTER — Inpatient Hospital Stay (HOSPITAL_COMMUNITY): Payer: BLUE CROSS/BLUE SHIELD | Admitting: Occupational Therapy

## 2015-06-02 ENCOUNTER — Inpatient Hospital Stay (HOSPITAL_COMMUNITY): Payer: BLUE CROSS/BLUE SHIELD

## 2015-06-02 ENCOUNTER — Inpatient Hospital Stay (HOSPITAL_COMMUNITY): Payer: BLUE CROSS/BLUE SHIELD | Admitting: Speech Pathology

## 2015-06-02 DIAGNOSIS — R5381 Other malaise: Secondary | ICD-10-CM

## 2015-06-02 LAB — BLOOD GAS, ARTERIAL
ACID-BASE EXCESS: 15.3 mmol/L — AB (ref 0.0–2.0)
Bicarbonate: 40.3 mEq/L — ABNORMAL HIGH (ref 20.0–24.0)
DRAWN BY: 44135
FIO2: 0.4
LHR: 14 {breaths}/min
O2 SAT: 98.9 %
PCO2 ART: 59.6 mmHg — AB (ref 35.0–45.0)
PEEP/CPAP: 5 cmH2O
PO2 ART: 140 mmHg — AB (ref 80.0–100.0)
PRESSURE CONTROL: 14 cmH2O
Patient temperature: 100
TCO2: 42 mmol/L (ref 0–100)
pH, Arterial: 7.449 (ref 7.350–7.450)

## 2015-06-02 LAB — BASIC METABOLIC PANEL
ANION GAP: 11 (ref 5–15)
BUN: 16 mg/dL (ref 6–20)
CALCIUM: 9.7 mg/dL (ref 8.9–10.3)
CO2: 26 mmol/L (ref 22–32)
Chloride: 101 mmol/L (ref 101–111)
Creatinine, Ser: 0.52 mg/dL (ref 0.44–1.00)
GFR calc Af Amer: >60 mL/min (ref >60)
GFR calc non Af Amer: >60 mL/min (ref >60)
GLUCOSE: 125 mg/dL — AB (ref 65–99)
Potassium: 3.9 mmol/L (ref 3.5–5.1)
Sodium: 138 mmol/L (ref 135–145)

## 2015-06-02 LAB — CBC
HEMATOCRIT: 42.4 % (ref 36.0–46.0)
Hemoglobin: 13.6 g/dL (ref 12.0–15.0)
MCH: 29.6 pg (ref 26.0–34.0)
MCHC: 32.1 g/dL (ref 30.0–36.0)
MCV: 92.2 fL (ref 78.0–100.0)
Platelets: 257 10*3/uL (ref 150–400)
RBC: 4.6 MIL/uL (ref 3.87–5.11)
RDW: 13.9 % (ref 11.5–15.5)
WBC: 11.2 10*3/uL — AB (ref 4.0–10.5)

## 2015-06-02 MED ORDER — SALINE SPRAY 0.65 % NA SOLN
1.0000 | NASAL | Status: DC | PRN
  Filled 2015-06-02: qty 44

## 2015-06-02 NOTE — Progress Notes (Signed)
Occupational Therapy Session Note  Patient Details  Name: Julia Massey MRN: [id number] Date of Birth: May 23, 1950  Today's Date: 06/02/2015 OT Individual Time:  -   0800-0900  (60 min)      Short Term Goals: Week 1:  OT Short Term Goal 1 (Week 1): Pt. will be SBA with standing balance in shower OT Short Term Goal 2 (Week 1): Pt will bathe with SBA OT Short Term Goal 3 (Week 1): Pt. will dress with SBA OT Short Term Goal 4 (Week 1): Pt. will demonstrate SBa transfers to toilet OT Short Term Goal 5 (Week 1): Pt. will engaged in shower with SBA  Skilled Therapeutic Interventions/Progress Updates:    Pt ate breakfast.  She recalled 2 things about this OT from Friday.  Went over breathing strategies for pursed lip breathing and holding for 3 counts at end range.  Pt needed max cues to follow.  Ambulated with RW about 10 feet . O2  sats went to 81%.  Pt starts to hyper ventilate.  Go over breathing strategies and how her hyperventilating makes her readings worse.  .  Pt starts talking and anxiety goes down.  Ox = 100% with 02 on 2liters during entire session.  Pt expresses concern about family history of early death with her Mom and brother.  Have her monitor her o2 levels as she dons shoes and puts on make up.    She benefits from the visual feed back and a pulse oximeter would be helpful.  Pt remained in recliner at end of session after grooming.   Therapy Documentation Precautions:  Precautions Precautions: Fall Restrictions Weight Bearing Restrictions: No Vital Signs: Therapy Vitals Temp: 98.5 F (36.9 C) Temp Source: Oral Pulse Rate: 83 Resp: 18 BP: 117/76 mmHg Patient Position (if appropriate): Lying Oxygen Therapy SpO2: 100 % O2 Device: Nasal Cannula O2 Flow Rate (L/min): 2 L/min Pain:  none         :    See Function Navigator for Current Functional Status.   Therapy/Group: Individual Therapy  Humberto Seals 06/02/2015, 7:45 AM

## 2015-06-02 NOTE — Plan of Care (Signed)
Problem: RH PAIN MANAGEMENT Goal: RH STG PAIN MANAGED AT OR BELOW PT'S PAIN GOAL Pain less than or equal to 2  Outcome: Progressing No c/o pain     

## 2015-06-02 NOTE — Progress Notes (Signed)
Speech Language Pathology Daily Session Notes  Patient Details  Name: Julia Massey MRN: [id number] Date of Birth: August 30, 1950  Today's Date: 06/02/2015  Session 1 SLP Individual Time: 0900-0930 SLP Individual Time Calculation (min): 30 min Session 2 SLP Individual Time: 1320-1350 SLP Individual Time Calculation (min): 30 min  Short Term Goals: Week 1: SLP Short Term Goal 1 (Week 1): Pt will complete base of tongue strengthening exercise with Mod assist multimodal cues for accuracy SLP Short Term Goal 2 (Week 1): Pt will utilize chin tuck during PO intake to prevent overt s/s of aspiraiton with Min verbal cues  SLP Short Term Goal 3 (Week 1): Pt will demonstrate basic problem solving during functional and self-care tasks with Min verbal cues SLP Short Term Goal 4 (Week 1): Pt will sustain attention to tasks for 8-10 minutes with Min verbal cues for redirection SLP Short Term Goal 5 (Week 1): Pt will utilize external aids to assist with recall of new information with Min assist question cues SLP Short Term Goal 6 (Week 1): Pt will request help as needed during completion of basic tasks with Min question cues  Skilled Therapeutic Interventions: Session 1 Skilled treatment session focused on addressing dysphagia goals. SLP provided advanced texture and liquid consistencies trials.  Patient was observed with thin liquids via cup with chin tuck which resulted in no overt s/s of aspiration, when chin tuck was removed patient demonstrated throat clear or cough in 2/3 trials.  Recommend initiation of the Free Water Protocol with use of chin tuck.  Dys.3 texture trials resulted in timely oral clearance with no observed residue and Mod verbal cues to recall and carryover use of chin tuck.which resulted in no overt s/s of aspiration.  Patient consumed nectar-thick liquids via cup with Min-Mod cues to carryover use of chin tuck.  Also recommend initiation of a Dys.3 texture diet with nectar-thick liquids  and continued full supervision.   Session 2 Skilled treatment session focused on addressing recall of Free Water Protocol procedures from earlier session; patient recalled information with Mod question cues for details of procedures.  SLP then facilitated session by providing skilled observation of lunch tray of Dys.3 textures and nectar-thick liquids with Min verbal cues of use of safe swallow strategies.  Upon completion, patient requested to have water and needed Mod cues to identify why she could not have water.  SLP posted signs to assist with carryover of procedures.  Additionally, patient was able to demonstrate learning of the [place] by successful completion of the exercise with Min verbal and visual cues.  Continue with current plan of care.   Function:  Eating Eating   Modified Consistency Diet: Yes Eating Assist Level: Supervision or verbal cues           Cognition Comprehension Comprehension assist level: Understands complex 90% of the time/cues 10% of the time  Expression   Expression assist level: Expresses complex ideas: With extra time/assistive device  Social Interaction Social Interaction assist level: Interacts appropriately with others with medication or extra time (anti-anxiety, antidepressant).  Problem Solving Problem solving assist level: Solves basic 90% of the time/requires cueing < 10% of the time  Memory Memory assist level: Recognizes or recalls 90% of the time/requires cueing < 10% of the time    Pain Pain Assessment Pain Assessment: No/denies pain x2  Therapy/Group: Individual Therapy x2  Charlane Ferretti., CCC-SLP [phone]  Karolyna Bianchini 06/02/2015, 9:19 AM

## 2015-06-02 NOTE — Progress Notes (Signed)
Social Work  Social Work Assessment and Plan  Patient Details  Name: Julia Massey MRN: [id number] Date of Birth: 1950/11/20  Today's Date: 06/02/2015  Problem List:  Patient Active Problem List   Diagnosis Date Noted  . Encephalopathy 05/30/2015  . Atypical pneumonia   . Acute pulmonary edema (HCC)   . Chronic diastolic CHF (congestive heart failure) (HCC)   . Pulmonary hypertension (HCC)   . HLD (hyperlipidemia)   . Hypernatremia   . Anxiety state   . Depression   . Acute encephalopathy   . Acute respiratory failure with hypoxemia (HCC)   . Altered mental status   . COPD exacerbation (HCC)   . Encephalopathy acute   . ARDS (adult respiratory distress syndrome) (HCC)   . Encounter for orogastric (OG) tube placement   . Respiratory failure (HCC)   . Acute respiratory failure with hypoxia (HCC) 05/14/2015  . Malnutrition of moderate degree 05/14/2015   Past Medical History: No past medical history on file. Past Surgical History: No past surgical history on file. Social History:  reports that she quit smoking about 7 weeks ago. Her smoking use included Cigarettes. She has a 3.75 pack-year smoking history. She does not have any smokeless tobacco history on file. She reports that she drinks about 2.4 oz of alcohol per week. She reports that she does not use illicit drugs.  Family / Support Systems Marital Status: Married How Long?: 25 years Patient Roles: Spouse, Parent Spouse/Significant Other: Viviann Spare [phone]  Children: Melissa Franco-daughter [phone]-cell Other Supports: friends Anticipated Caregiver: melissa Ability/Limitations of Caregiver: Efraim Kaufmann is a stay at home Mom with three small children, her plan is to stay with pt and her step-sister until able to stay alone. Pt's husband is incarcerated until June 2017 Caregiver Availability: 24/7 Family Dynamics: Pt has a grown daughter and 4 yo adopted daughter-[name] whom lives with she and husband. Her husband is in  jail for multiple DUI's and plans to be out by June. She reports it is the stress that affected her health, she just kept pushing herself until got really sick. She feels husband is stupid for continuing to drink and drive and it finally caught up with him.  Social History Preferred language: English Religion: Methodist Cultural Background: No issues Education: Automotive engineer educated Read: Yes Write: Yes Employment Status: Retired Fish farm manager Issues: Her husband's legal issues but none of her own Guardian/Conservator: None-according to MD pt is capable of making her own decisions while here. Her grown daughter-Melissa is involved in her care and will help at discharge   Abuse/Neglect Physical Abuse: Denies Verbal Abuse: Denies Sexual Abuse: Denies Exploitation of patient/patient's resources: Denies Self-Neglect: Denies  Emotional Status Pt's affect, behavior adn adjustment status: Pt is motivated to focus upon herself and getting healthy again. She is usually very health orientated and very active. She feels the stress, the holidays and husband's legal issues brought her down. She has always been independent and taken care of others. She has now realized the importance of taking care of herself first. Recent Psychosocial Issues: had been healthy until this pneumonia hit her, she went to the MD and managed her COPD Pyschiatric History: No history deferred depression screen due to tired from therapies today. This worker feels she would benefit from neuro-psych seeing while here, due to the multiple stressors she is dealing with. Will make referral while here. Substance Abuse History: Tobacco plans to quit realizes how bad it is for her and has not smoked since being in  the hospital and her daughter-[name] is holding her accountable now.  Patient / Family Perceptions, Expectations & Goals Pt/Family understanding of illness & functional limitations: Pt is able to explain her condition  and realizes how serious it got and how fortunate she is to be doing so well now. She talks to the MD and feels her questions are being answered and addressed. She is focusing on herself and recovery from this. Her daughter is a Charity fundraiser and fully understands pt's condition. Premorbid pt/family roles/activities: Wife, Mother, grandmother, retiree, church member, Chief Financial Officer, etc Anticipated changes in roles/activities/participation: resume Pt/family expectations/goals: Pt states: " I plan to recover from this and focus on [name] and my happniess."  Melissa states: " I am so glad she is doing better and recovering from this. It was scarey at first."  [employer]: None Premorbid Home Care/DME Agencies: None Transportation available at discharge: Daughter-Melissa Resource referrals recommended: Neuropsychology, Support group (specify)  Discharge Planning Living Arrangements: Children Support Systems: Children, Manufacturing engineer, Psychologist, clinical community Type of Residence: Private residence Community education officer Resources: Media planner (specify) Herbalist) Financial Resources: Family Support Financial Screen Referred: No Living Expenses: Own Money Management: Patient Does the patient have any problems obtaining your medications?: No Home Management: Patient did home management Patient/Family Preliminary Plans: Return home with Mel Almond and Melissa staying with them until she is able to be alone. Efraim Kaufmann is currently taking care of Mel Almond and Melissa's husband is caring for their three children. Pt's husband once out of jail in June witll be back in the home also. Main goals are to get stronger and build up her endurance while here. Social Work Anticipated Follow Up Needs: HH/OP, Support Group  Clinical Impression Pleasant female who is doing well and very open regarding her concerns and feelings. She realizes she push herself too long trying to recover from what she thought was the flu until she  was critical. Her grown daughter-Melissa is involved and currently caring for pt's 63 yo adopted daughter-[name]. She will also assist pt once ready for discharge. Would benefit from neuro-psych seeing while here due to multiple stressors she has dealt with recently. She has a strong will and should do very well here. Will work on providing support and a safe discharge plan.   Lucy Chris 06/02/2015, 3:31 PM

## 2015-06-02 NOTE — Care Management Note (Signed)
[facility] Individual Statement of Services  Patient Name:  Julia Massey  Date:  06/02/2015  Welcome to the [facility].  Our goal is to provide you with an individualized program based on your diagnosis and situation, designed to meet your specific needs.  With this comprehensive rehabilitation program, you will be expected to participate in at least 3 hours of rehabilitation therapies Monday-Friday, with modified therapy programming on the weekends.  Your rehabilitation program will include the following services:  Physical Therapy (PT), Occupational Therapy (OT), Speech Therapy (ST), 24 hour per day rehabilitation nursing, Therapeutic Recreaction (TR), Neuropsychology, Case Management (Social Worker), Rehabilitation Medicine, Nutrition Services and Pharmacy Services  Weekly team conferences will be held on Wednesday to discuss your progress.  Your Social Worker will talk with you frequently to get your input and to update you on team discussions.  Team conferences with you and your family in attendance may also be held.  Expected length of stay: 10-12 days  Overall anticipated outcome: mod/i level  Depending on your progress and recovery, your program may change. Your Social Worker will coordinate services and will keep you informed of any changes. Your Social Worker's name and contact numbers are listed  below.  The following services may also be recommended but are not provided by the Inpatient [facility]:   Driving Evaluations  Home Health Rehabiltiation Services  Outpatient Rehabilitation Services    Arrangements will be made to provide these services after discharge if needed.  Arrangements include referral to agencies that provide these services.  Your insurance has been verified to be: BCBS Your primary doctor is:  Dr. Amada Jupiter  Pertinent information will be shared with your doctor and your insurance company.  Social  Worker:  Dossie Der, SW [phone] or [phone]  Information discussed with and copy given to patient by: Lucy Chris, 06/02/2015, 2:13 PM

## 2015-06-02 NOTE — IPOC Note (Addendum)
Overall Plan of Care [facility]) Patient Details Name: Julia Massey MRN: [id number] DOB: Feb 01, 1951  Admitting Diagnosis: debility   encephalopathy  Hospital Problems: Principal Problem:   Encephalopathy Active Problems:   COPD exacerbation (HCC)   Chronic diastolic CHF (congestive heart failure) (HCC)   Anxiety state     Functional Problem List: Nursing Endurance, Medication Management, Motor, Nutrition, Pain, Perception, Safety, Sensory, Skin Integrity  PT Balance, Endurance, Safety  OT Balance, Endurance, Motor, Safety  SLP Nutrition, Motor, Cognition  TR         Basic ADL's: OT Grooming, Bathing, Dressing, Toileting     Advanced  ADL's: OT Simple Meal Preparation, Laundry     Transfers: PT Bed Mobility, Bed to Chair, Customer service manager, Tub/Shower     Locomotion: PT Ambulation, Psychologist, prison and probation services, Stairs     Additional Impairments: OT None  SLP Swallowing, Social Cognition   Problem Solving, Memory, Attention, Awareness  TR      Anticipated Outcomes Item Anticipated Outcome  Self Feeding independent  Swallowing  Supervision    Basic self-care  mod I  Toileting  mod I   Bathroom Transfers  Mod I for toilet transfer; sup for shower  Bowel/Bladder  Remain continent of bowel and bladder.   Transfers  mod I for transfers  Locomotion  mod I gait, stairs and w/c mobility  Communication     Cognition  Supervision with basic   Pain  Pain less than or equal to 2.  Safety/Judgment  Minimal assist for transfer.   Therapy Plan: PT Intensity: Minimum of 1-2 x/day ,45 to 90 minutes PT Frequency: 5 out of 7 days PT Duration Estimated Length of Stay: 10 to 12 OT Intensity: Minimum of 1-2 x/day, 45 to 90 minutes OT Frequency: 5 out of 7 days OT Duration/Estimated Length of Stay: 10-12 days SLP Intensity: Minumum of 1-2 x/day, 30 to 90 minutes SLP Frequency: 3 to 5 out of 7 days SLP Duration/Estimated Length of Stay: 10-12 days        Team  Interventions: Nursing Interventions Patient/Family Education, Disease Management/Prevention, Pain Management, Medication Management, Skin Care/Wound Management, Cognitive Remediation/Compensation, Dysphagia/Aspiration Precaution Training, Discharge Planning  PT interventions Ambulation/gait training, Community reintegration, Discharge planning, Functional mobility training, DME/adaptive equipment instruction, Neuromuscular re-education, Patient/family education, Splinting/orthotics, Stair training, UE/LE Strength taining/ROM, Wheelchair propulsion/positioning, UE/LE Coordination activities, Therapeutic Activities, Therapeutic Exercise  OT Interventions Warden/ranger, Firefighter, Fish farm manager, Functional mobility training, Pain management, Patient/family education, Self Care/advanced ADL retraining, Therapeutic Activities, Therapeutic Exercise, UE/LE Strength taining/ROM  SLP Interventions Cueing hierarchy, Cognitive remediation/compensation, Dysphagia/aspiration precaution training, Environmental controls, Functional tasks, Internal/external aids, Patient/family education, Therapeutic Activities  TR Interventions    SW/CM Interventions  Psychsocial, Discharge Planning, Pt/Family Education    Team Discharge Planning: Destination: PT-Home ,OT- Home , SLP-Home Projected Follow-up: PT-Home health PT, OT-  Home health OT, SLP-24 hour supervision/assistance, Outpatient SLP Projected Equipment Needs: PT-To be determined, OT- To be determined, SLP-None recommended by SLP Equipment Details: PT- , OT-  Patient/family involved in discharge planning: PT- Patient,  OT-Patient, SLP-Patient  MD ELOS: 10-15d Medical Rehab Prognosis:  Excellent Assessment: 65 y.o. right handed female with history of tobacco abuse ,COPD . Patient lives with spouse independent prior to admission. 2 level home with bedroom upstairs. Presented 05/15/2015 with productive cough low-grade  fever and chills. Recently placed on a Z-Pak by her PCP. Found to be hypoxic 63% on room air. CT of the chest revealed extensive bilateral patchy infiltrates atypical pneumonia. No evidence  of pulmonary emboli. White blood cell count 20,800, lactic acid 2.5. Potassium 3.2. Placed on broad-spectrum antibiotic. Patient required intubation. Echocardiogram with ejection fraction of 60% grade 1 diastolic dysfunction. Blood cultures no growth. Bouts of altered mental status. EEG negative for seizure. Cranial CT scan negative. Extubated 05/26/2015 and follow per critical care. Subcutaneous heparin for DVT prophylaxis. Swallow study completed 05/28/2015 and diet advanced to dysphagia #2 nectar thick liquids.    Now requiring 24/7 Rehab RN,MD, as well as CIR level PT, OT and SLP.  Treatment team will focus on ADLs and mobility with goals set at [facility]  See Team Conference Notes for weekly updates to the plan of care

## 2015-06-02 NOTE — Progress Notes (Signed)
Patient information reviewed and entered into eRehab system by Esty Ahuja, RN, CRRN, PPS Coordinator.  Information including medical coding and functional independence measure will be reviewed and updated through discharge.    

## 2015-06-02 NOTE — Progress Notes (Signed)
Physical Therapy Session Note  Patient Details  Name: Julia Massey MRN: [id number] Date of Birth: [birth year]  Today's Date: 06/02/2015 PT Individual Time: 1005-1110 PT Individual Time Calculation (min): 65 min  Short Term Goals: Week 1:  PT Short Term Goal 1 (Week 1): Pt will increase bed moiblity to mod I.  PT Short Term Goal 2 (Week 1): Pt will increase transfers bed to chair, chair to bed to S.  PT Short Term Goal 3 (Week 1): Pt will ambulate with LRAD about 50 feet with S.  PT Short Term Goal 4 (Week 1): Pt will propel w/c about 50 feet with S.  PT Short Term Goal 5 (Week 1): Pt will ascend/descend 4 stairs with B rails and S.   Skilled Therapeutic Interventions/Progress Updates:  At rest in recliner, pt c/o anxiety, began talking about mother and brother who died of sudden cardiac death, and asked if she should have an EKG before starting tx. PT reassured pt about PT monitoring vital signs as needed during tx. See vitals; after cueing for pursed lips breathing, O2 sats rose to 98%.  Core strengthening in supported> unsupported sitting through reciprocal scooting, propelling w/c with bil LEs x 150' x 2.   neuromuscular re-education via forced use, manual and VCs for blocked sit><stand without use of UEs with ball between knees for core activiation; wt shifting in standing L><R focusing on = wt bearing, A-P orientation, as pt leans backwards due to fear of falling. LOB backwards when not focusing on balance.  PT educated pt on visualization for anxiety reduction, diaphragmatic breathing.   Gait with RW x 12' mod assist, before pt had to sit due to DOE. O2 sats 85%.  After 1 minute diaphragmatic breathing, O2 sats 89/90%.   Pt requested getting back to bed.  Pt left resting in bed with bed alarm set and all needs within reach.  Pt's anxiety limits her participation in tx.    Therapy Documentation Precautions:  Precautions Precautions: Fall Restrictions Weight Bearing  Restrictions: No   Vital Signs: Therapy Vitals Pulse Rate: (!) 102 Oxygen Therapy SpO2: 92 % (after ex in sitting) O2 Device: Nasal Cannula O2 Flow Rate (L/min): 2 L/min Pain: Pain Assessment Pain Assessment: No/denies pain       See Function Navigator for Current Functional Status.   Therapy/Group: Individual Therapy  Dhruti Ghuman 06/02/2015, 11:15 AM

## 2015-06-02 NOTE — Progress Notes (Signed)
[facility] PHYSICAL MEDICINE & REHABILITATION     PROGRESS NOTE    Subjective/Complaints: Patient states she has an episode  Of shortness of breath over the weekend. She was given Xanax with some improvement.she feels okay this morning.  ROS: Pt denies, nausea, vomiting, abdominal pain, diarrhea, chest pain, shortness of breath, palpitations, dysuria, dizziness,   bleeding,   or depression. Mild anxiety   Objective: Vital Signs: Blood pressure 117/76, pulse 83, temperature 98.5 F (36.9 C), temperature source Oral, resp. rate 18, height 5' (1.524 m), weight 48.9 kg (107 lb 12.9 oz), SpO2 100 %. No results found.  Recent Labs  05/30/15 2100  WBC 13.2*  HGB 13.2  HCT 41.3  PLT 245    Recent Labs  05/30/15 2100  NA 138  K 4.3  CL 102  GLUCOSE 152*  BUN 18  CREATININE 0.60  CALCIUM 9.4   CBG (last 3)  No results for input(s): GLUCAP in the last 72 hours.  Wt Readings from Last 3 Encounters:  06/02/15 48.9 kg (107 lb 12.9 oz)  05/29/15 47.5 kg (104 lb 11.5 oz)    Physical Exam:  Constitutional:  Frail appearing. No distress  HENT:  Head: Normocephalic and atraumatic.  Eyes: Conjunctivae and EOM are normal. Pupils are equal, round, and reactive to light.  Neck: Normal range of motion. Neck supple. No tracheal deviation present. No thyromegaly present.  Cardiovascular: Regular rhythm.  HR 80's Respiratory: She has no wheezes. She has no rales. No distress. Chest less tender. Good air movement GI: Soft. She exhibits no distension.  Neurological: She is alert. No cranial nerve deficit. Coordination normal.  Makes good eye contact with examiner. Oriented to month and year, place. Improved awareness and insight. Answered biographical questions. No sensory deficits. MMT remains: UE 3/5 delt,bicep,wrist, tricep, 4/5 HI. LE: 3/5 HF, KE and 4/5 ADF/APF.  Skin: Skin is warm and dry.  Psychiatric:  Pleasant and cooperative.   Assessment/Plan: 1.  Debilitation/ encephalopathy secondary to acute respiratory failure and multiple medical which require 3+ hours per day of interdisciplinary therapy in a comprehensive inpatient rehab setting. Physiatrist is providing close team supervision and 24 hour management of active medical problems listed below. Physiatrist and rehab team continue to assess barriers to discharge/monitor patient progress toward functional and medical goals.  Function:  Bathing Bathing position   Position: Shower  Bathing parts Body parts bathed by patient: Right arm, Left arm, Chest, Abdomen, Front perineal area, Right upper leg, Buttocks, Left lower leg, Right lower leg, Left upper leg Body parts bathed by helper: Back  Bathing assist Assist Level: Touching or steadying assistance(Pt > 75%)      Upper Body Dressing/Undressing Upper body dressing   What is the patient wearing?: Bra, Pull over shirt/dress Bra - Perfomed by patient: Thread/unthread right bra strap, Thread/unthread left bra strap, Hook/unhook bra (pull down sports bra)   Pull over shirt/dress - Perfomed by patient: Thread/unthread right sleeve, Thread/unthread left sleeve, Put head through opening, Pull shirt over trunk          Upper body assist Assist Level: Supervision or verbal cues      Lower Body Dressing/Undressing Lower body dressing   What is the patient wearing?: Pants, Underwear, Non-skid slipper socks Underwear - Performed by patient: Thread/unthread right underwear leg, Thread/unthread left underwear leg, Pull underwear up/down   Pants- Performed by patient: Thread/unthread right pants leg, Thread/unthread left pants leg, Pull pants up/down     Non-skid slipper socks- Performed by helper:  Don/doff right sock, Don/doff left sock                  Lower body assist Assist for lower body dressing: Touching or steadying assistance (Pt > 75%)      Toileting Toileting   Toileting steps completed by patient: Adjust clothing  prior to toileting, Performs perineal hygiene, Adjust clothing after toileting   Toileting Assistive Devices: Grab bar or rail  Toileting assist Assist level: Touching or steadying assistance (Pt.75%)   Transfers Chair/bed transfer   Chair/bed transfer method: Stand pivot Chair/bed transfer assist level: Touching or steadying assistance (Pt > 75%) Chair/bed transfer assistive device: Armrests     Locomotion Ambulation     Max distance: 18 Assist level: Touching or steadying assistance (Pt > 75%)   Wheelchair   Type: Manual Max wheelchair distance: 30 Assist Level: Touching or steadying assistance (Pt > 75%)  Cognition Comprehension Comprehension assist level: Understands complex 90% of the time/cues 10% of the time  Expression Expression assist level: Expresses basic needs/ideas: With no assist  Social Interaction Social Interaction assist level: Interacts appropriately with others - No medications needed.  Problem Solving Problem solving assist level: Solves basic problems with no assist  Memory Memory assist level: Recognizes or recalls 90% of the time/requires cueing < 10% of the time   Medical Problem List and Plan:  1. Debilitation with encephalopathy secondary to acute respiratory failure, orientation improving  -continue CIR therapies 2. DVT Prophylaxis/Anticoagulation: Subcutaneous heparin indicated at present 3. Pain Management/musculoskeletal chest and back pain: Tylenol as needed.   4. Mood: Continue Prozac 40 mg daily  -xanax prn added 5. Neuropsych: This patient is capable of making decisions on her own behalf. 6. Skin/Wound Care: Routine skin checks 7. Fluids/Electrolytes/Nutrition: Routine I's and O's. Encourage PO.  8. Tobacco abuse  Provide counseling.   9. Dysphagia. Dysphagia #2 nectar thick liquids. Follow-up speech therapy.   -encourage fluids  -check bmet tomorrow 10. Chest wall pain/respiratory/COPD:  musculoskeletal. Ice applied, analgesics,  monitor for now.  -she is using IS, also having some anxiety related shortness of breath  -reviewed breathing techniques and discussed anxiety impact also  -oxygen as needed  11. Family history of CAD, would check EKG if she has recurrent shortness of breath/chest pain LOS (Days) 3 A FACE TO FACE EVALUATION WAS PERFORMED  Erick Colace 06/02/2015 7:36 AM

## 2015-06-03 ENCOUNTER — Inpatient Hospital Stay (HOSPITAL_COMMUNITY): Payer: BLUE CROSS/BLUE SHIELD | Admitting: Speech Pathology

## 2015-06-03 ENCOUNTER — Inpatient Hospital Stay (HOSPITAL_COMMUNITY): Payer: BLUE CROSS/BLUE SHIELD | Admitting: Occupational Therapy

## 2015-06-03 ENCOUNTER — Inpatient Hospital Stay (HOSPITAL_COMMUNITY): Payer: BLUE CROSS/BLUE SHIELD | Admitting: Physical Therapy

## 2015-06-03 NOTE — Progress Notes (Addendum)
Occupational Therapy Session Note  Patient Details  Name: Julia Massey MRN: [id number] Date of Birth: Mar 25, 1951  Today's Date: 06/03/2015 OT Individual Time: 0901-1002 OT Individual Time Calculation (min): 61 min    Short Term Goals: Week 1:  OT Short Term Goal 1 (Week 1): Pt. will be SBA with standing balance in shower OT Short Term Goal 2 (Week 1): Pt will bathe with SBA OT Short Term Goal 3 (Week 1): Pt. will dress with SBA OT Short Term Goal 4 (Week 1): Pt. will demonstrate SBa transfers to toilet OT Short Term Goal 5 (Week 1): Pt. will engaged in shower with SBA  Skilled Therapeutic Interventions/Progress Updates:    Session 1:  Pt completed grooming tasks in standing as well as donning TEDs and shoes at EOB. Oxygen sats 94% on 2Ls nasal cannula during rest.  Pt with dyspnea 3/4 after standing for 1-2 mins.  Also with increased anxiety at times as well.  She was able to ambulate to the bathroom with the RW as well, but tends to pick it up off of the ground instead of rolling it or sliding it when completing sharp turns.  Educated pt on purse lip breathing while resting.  Oxygen sats decreased to 86% on 2Ls after standing during oral hygiene. Performed water protocol after oral hygiene.  Pt left in wheelchair at end of session with call button and phone within reach.    Session 2:  (1500-1530)  Rolled pt to the ADL apartment for work on [employer] with use of the RW.  Discussed correct use of RW while transporting items in the kitchen.  Also talked about use of a walker bag and possible walker tray as well.  Pt able to get up with the RW and place items back in the refrigerator and cabinets that therapist had taken out.  Mod instructional cueing for walker usage and safety as well as for pt to avoid walking backwards with the walker.  Pt returned to room via wheelchair.  Call button and phone within reach.  Oxygen sats 93% on 2Ls nasal cannula at rest.     Therapy  Documentation Precautions:  Precautions Precautions: Fall Restrictions Weight Bearing Restrictions: No  Vital Signs: Therapy Vitals Temp: 97.8 F (36.6 C) Temp Source: Oral Pulse Rate: 85 Resp: 18 BP: 100/61 mmHg Patient Position (if appropriate): Sitting Oxygen Therapy SpO2: 100 % O2 Device: Nasal Cannula O2 Flow Rate (L/min): 2 L/min Pain: Pain Assessment Pain Assessment: No/denies pain ADL: See Function Navigator for Current Functional Status.   Therapy/Group: Individual Therapy  Estiven Kohan OTR/L 06/03/2015, 4:05 PM

## 2015-06-03 NOTE — Progress Notes (Signed)
Speech Language Pathology Daily Session Note  Patient Details  Name: Julia Massey MRN: [id number] Date of Birth: Oct 26, 1950  Today's Date: 06/03/2015 SLP Individual Time: 1005-1100 SLP Individual Time Calculation (min): 55 min  Short Term Goals: Week 1: SLP Short Term Goal 1 (Week 1): Pt will complete base of tongue strengthening exercise with Mod assist multimodal cues for accuracy SLP Short Term Goal 2 (Week 1): Pt will utilize chin tuck during PO intake to prevent overt s/s of aspiraiton with Min verbal cues  SLP Short Term Goal 3 (Week 1): Pt will demonstrate basic problem solving during functional and self-care tasks with Min verbal cues SLP Short Term Goal 4 (Week 1): Pt will sustain attention to tasks for 8-10 minutes with Min verbal cues for redirection SLP Short Term Goal 5 (Week 1): Pt will utilize external aids to assist with recall of new information with Min assist question cues SLP Short Term Goal 6 (Week 1): Pt will request help as needed during completion of basic tasks with Min question cues  Skilled Therapeutic Interventions:  Pt was seen for skilled ST targeting dysphagia goals.  Pt had not eaten breakfast yet upon SLP's arrival so SLP completed skilled observations during presentations of pt's currently prescribed diet versus during trials of regular water per the water protocol.  Pt required x1 min assist verbal cue for recall and use of chin tuck during POs but was otherwise supervision for use of swallowing precautions.  No overt s/s of aspiration evident with solids or liquids.  SLP also provided skilled education during today's therapy session regarding pharyngeal strengthening exercises targeting base of tongue.  Pt was able to complete the Masako technique for 2 sets of 5 repetitions with manual assist from SLP to anchor tongue.  Pt was also able to complete 2-3 sets of 5 repetitions each of the effortful swallow maneuver and tongue protrusion against resistance with  min assist verbal cues.  Pt instructed to complete exercises in between therapy sessions.  Handout provided to maximize carryover in between therapy sessions. Pt was left in bed with bed alarm set and call bell left within reach.  Continue per current plan of care.    Function:  Eating Eating   Modified Consistency Diet: Yes Eating Assist Level: Supervision or verbal cues           Cognition Comprehension Comprehension assist level: Understands complex 90% of the time/cues 10% of the time  Expression   Expression assist level: Expresses complex ideas: With extra time/assistive device  Social Interaction Social Interaction assist level: Interacts appropriately with others with medication or extra time (anti-anxiety, antidepressant).  Problem Solving Problem solving assist level: Solves basic 90% of the time/requires cueing < 10% of the time  Memory Memory assist level: Recognizes or recalls 75 - 89% of the time/requires cueing 10 - 24% of the time    Pain Pain Assessment Pain Assessment: No/denies pain  Therapy/Group: Individual Therapy  Acelynn Dejonge, Melanee Spry 06/03/2015, 12:41 PM

## 2015-06-03 NOTE — Progress Notes (Signed)
Physical Therapy Note  Patient Details  Name: MODESTA SAMMONS MRN: [id number] Date of Birth: 23-May-1950 Today's Date: 06/03/2015    Time: 1300-1345 45 minutes  1:1 No c/o pain. Pt gait 50'x3, 61' with RW, close supervision on 2LO2. spO2 92% after gait.  Pt performed standing therex 2 x 10  with bilat LE for increasing strength and standing tolerance. Pt requires frequent seated breaks but is motivated to improve. Step ups to 4'' step with repetitions for strengthening with min A.  Pt min A with balance at end of session due to fatigue.  Pt missed 15 minutes of session due to need to eat lunch.   Nazarene Bunning 06/03/2015, 1:52 PM

## 2015-06-03 NOTE — Progress Notes (Signed)
[facility] PHYSICAL MEDICINE & REHABILITATION     PROGRESS NOTE    Subjective/Complaints: Patient has no new issues this morning. She had to switch rooms last night because of a heating issue  ROS: Pt denies, nausea, vomiting, abdominal pain, diarrhea, chest pain, shortness of breath, palpitations, dysuria, dizziness,   bleeding,   or depression. Mild anxiety   Objective: Vital Signs: Blood pressure 101/61, pulse 87, temperature 98.3 F (36.8 C), temperature source Oral, resp. rate 18, height 5' (1.524 m), weight 46.4 kg (102 lb 4.7 oz), SpO2 100 %. No results found.  Recent Labs  06/02/15 0800  WBC 11.2*  HGB 13.6  HCT 42.4  PLT 257    Recent Labs  06/02/15 0800  NA 138  K 3.9  CL 101  GLUCOSE 125*  BUN 16  CREATININE 0.52  CALCIUM 9.7   CBG (last 3)  No results for input(s): GLUCAP in the last 72 hours.  Wt Readings from Last 3 Encounters:  06/03/15 46.4 kg (102 lb 4.7 oz)  05/29/15 47.5 kg (104 lb 11.5 oz)    Physical Exam:  Constitutional:  Frail appearing. No distress  HENT:  Head: Normocephalic and atraumatic.  Eyes: Conjunctivae and EOM are normal. Pupils are equal, round, and reactive to light.  Neck: Normal range of motion. Neck supple. No tracheal deviation present. No thyromegaly present.  Cardiovascular: Regular rhythm.  HR 80's Respiratory: She has no wheezes. She has no rales. No distress. Chest less tender. Good air movement GI: Soft. She exhibits no distension.  Neurological: She is alert. No cranial nerve deficit. Coordination normal.  Makes good eye contact with examiner. Oriented to month and year, place. Improved awareness and insight. Answered biographical questions. No sensory deficits. MMT remains: UE 3/5 delt,bicep,wrist, tricep, 4/5 HI. LE: 3/5 HF, KE and 4/5 ADF/APF.  Skin: Skin is warm and dry.  Psychiatric:  Pleasant and cooperative.   Assessment/Plan: 1. Debilitation/ encephalopathy secondary to acute respiratory  failure and multiple medical which require 3+ hours per day of interdisciplinary therapy in a comprehensive inpatient rehab setting. Physiatrist is providing close team supervision and 24 hour management of active medical problems listed below. Physiatrist and rehab team continue to assess barriers to discharge/monitor patient progress toward functional and medical goals.  Function:  Bathing Bathing position   Position: Shower  Bathing parts Body parts bathed by patient: Right arm, Left arm, Chest, Abdomen, Front perineal area, Right upper leg, Buttocks, Left lower leg, Right lower leg, Left upper leg Body parts bathed by helper: Back  Bathing assist Assist Level: Touching or steadying assistance(Pt > 75%)      Upper Body Dressing/Undressing Upper body dressing   What is the patient wearing?: Bra, Pull over shirt/dress Bra - Perfomed by patient: Thread/unthread right bra strap, Thread/unthread left bra strap, Hook/unhook bra (pull down sports bra)   Pull over shirt/dress - Perfomed by patient: Thread/unthread right sleeve, Thread/unthread left sleeve, Put head through opening, Pull shirt over trunk          Upper body assist Assist Level: Supervision or verbal cues      Lower Body Dressing/Undressing Lower body dressing   What is the patient wearing?: Pants, Underwear, Non-skid slipper socks Underwear - Performed by patient: Thread/unthread right underwear leg, Thread/unthread left underwear leg, Pull underwear up/down   Pants- Performed by patient: Thread/unthread right pants leg, Thread/unthread left pants leg, Pull pants up/down     Non-skid slipper socks- Performed by helper: Don/doff right sock, Don/doff left sock  Lower body assist Assist for lower body dressing: Touching or steadying assistance (Pt > 75%)      Toileting Toileting   Toileting steps completed by patient: Adjust clothing prior to toileting, Performs perineal hygiene, Adjust  clothing after toileting   Toileting Assistive Devices: Grab bar or rail  Toileting assist Assist level: Touching or steadying assistance (Pt.75%)   Transfers Chair/bed transfer   Chair/bed transfer method: Stand pivot Chair/bed transfer assist level: Touching or steadying assistance (Pt > 75%) Chair/bed transfer assistive device: Armrests     Locomotion Ambulation     Max distance: 12 Assist level: Moderate assist (Pt 50 - 74%)   Wheelchair   Type: Manual Max wheelchair distance: 150 Assist Level: Supervision or verbal cues  Cognition Comprehension Comprehension assist level: Understands complex 90% of the time/cues 10% of the time  Expression Expression assist level: Expresses complex ideas: With extra time/assistive device  Social Interaction Social Interaction assist level: Interacts appropriately with others with medication or extra time (anti-anxiety, antidepressant).  Problem Solving Problem solving assist level: Solves basic 90% of the time/requires cueing < 10% of the time  Memory Memory assist level: Recognizes or recalls 75 - 89% of the time/requires cueing 10 - 24% of the time   Medical Problem List and Plan:  1. 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 DVT Prophylaxis/Anticoagulation: Subcutaneous heparin indicated at present 3. Pain Management/musculoskeletal chest and back pain: Tylenol as needed.   4. Mood: Continue Prozac 40 mg daily  -xanax prn added 5. Neuropsych: This patient is capable of making decisions on her own behalf. 6. Skin/Wound Care: Routine skin checks 7. Fluids/Electrolytes/Nutrition: Routine I's and O's. Encourage PO.  8. Tobacco abuse  Provide counseling.   9. Dysphagia. Dysphagia #2 nectar thick liquids. Follow-up speech therapy.   -encourage  fluids  10. Chest wall pain/respiratory/COPD:  musculoskeletal. Ice applied, analgesics, monitor for now.  -she is using IS,   -reviewed breathing techniques and discussed anxiety impact also  -oxygen as needed  11. Family history of CAD, would check EKG if she has recurrent shortness of breath/chest pain LOS (Days) 4 A FACE TO FACE EVALUATION WAS PERFORMED  KIRSTEINS,ANDREW E 06/03/2015 1:21 PM

## 2015-06-04 ENCOUNTER — Inpatient Hospital Stay (HOSPITAL_COMMUNITY): Payer: BLUE CROSS/BLUE SHIELD | Admitting: Occupational Therapy

## 2015-06-04 ENCOUNTER — Inpatient Hospital Stay (HOSPITAL_COMMUNITY): Payer: BLUE CROSS/BLUE SHIELD

## 2015-06-04 ENCOUNTER — Inpatient Hospital Stay (HOSPITAL_COMMUNITY): Payer: BLUE CROSS/BLUE SHIELD | Admitting: Speech Pathology

## 2015-06-04 NOTE — Progress Notes (Signed)
Nutrition Follow-up  DOCUMENTATION CODES:   Non-severe (moderate) malnutrition in context of chronic illness  INTERVENTION:  Continue Ensure Enlive po BID, each supplement provides 350 kcal and 20 grams of protein.  Provide Magic cup with meals.   Encourage adequate PO intake.   NUTRITION DIAGNOSIS:   Inadequate oral intake related to dysphagia as evidenced by  (varied meal completion 10-60%); ongoing  GOAL:   Patient will meet greater than or equal to 90% of their needs; progressing  MONITOR:   PO intake, Supplement acceptance, Diet advancement, Weight trends, Labs, I & O's  REASON FOR ASSESSMENT:   Malnutrition Screening Tool    ASSESSMENT:   65 y.o. right handed female with history of tobacco abuse ,COPD . Presented 05/15/2015 with productive cough low-grade fever and chills.  CT of the chest revealed extensive bilateral patchy infiltrates atypical pneumonia.  Pt extubated 1/16. TF discontinued. S/p objective swallow evaluation 1/18.  Pt reports appetite is fine. Meal completion has been varied from 10-60%. Pt currently has Ensure ordered with varied consumption. She reports she likes them and would like to continue with it. Pt was encouraged to eat her food at meals and to drink her supplements.   Diet Order:  DIET DYS 3 Room service appropriate?: Yes; Fluid consistency:: Nectar Thick  Skin:  Reviewed, no issues  Last BM:  1/24  Height:   Ht Readings from Last 1 Encounters:  05/30/15 5' (1.524 m)    Weight:   Wt Readings from Last 1 Encounters:  06/03/15 102 lb 4.7 oz (46.4 kg)    Ideal Body Weight:  45.5 kg  BMI:  Body mass index is 19.98 kg/(m^2).  Estimated Nutritional Needs:   Kcal:  1400-1600  Protein:  75-85 grams  Fluid:  >/=1.5 L/day  EDUCATION NEEDS:   No education needs identified at this time  Roslyn Smiling, MS, RD, LDN Pager # [phone] After hours/ weekend pager # [phone]

## 2015-06-04 NOTE — Patient Care Conference (Signed)
Inpatient RehabilitationTeam Conference and Plan of Care Update Date: 06/04/2015   Time: 10:30 AM    Patient Name: Julia Massey      Medical Record Number: [id number]  Date of Birth: Feb 09, 1951 Sex: Female         Room/Bed: 4W05C/4W05C-01 Payor Info: Payor: BLUE CROSS BLUE SHIELD / Plan: BCBS OTHER / Product Type: *No Product type* /    Admitting Diagnosis: debility   encephalopathy  Admit Date/Time:  05/30/2015  6:20 PM Admission Comments: No comment available   Primary Diagnosis:  Encephalopathy Principal Problem: Encephalopathy  Patient Active Problem List   Diagnosis Date Noted  . Encephalopathy 05/30/2015  . Atypical pneumonia   . Acute pulmonary edema (HCC)   . Chronic diastolic CHF (congestive heart failure) (HCC)   . Pulmonary hypertension (HCC)   . HLD (hyperlipidemia)   . Hypernatremia   . Anxiety state   . Depression   . Acute encephalopathy   . Acute respiratory failure with hypoxemia (HCC)   . Altered mental status   . COPD exacerbation (HCC)   . Encephalopathy acute   . ARDS (adult respiratory distress syndrome) (HCC)   . Encounter for orogastric (OG) tube placement   . Respiratory failure (HCC)   . Acute respiratory failure with hypoxia (HCC) 05/14/2015  . Malnutrition of moderate degree 05/14/2015    Expected Discharge Date: Expected Discharge Date: 06/13/15  Team Members Present: Physician leading conference: Dr. Claudette Laws Social Worker Present: Dossie Der, LCSW Nurse Present: Carmie End, RN PT Present: Wanda Plump, PT OT Present: Perrin Maltese, OT SLP Present: Jackalyn Lombard, SLP PPS Coordinator present : Tora Duck, RN, CRRN     Current Status/Progress Goal Weekly Team Focus  Medical   low respiratory volumes, desat off O2, and with exercise, higher level cognitive, aspiration without chin tuck  home at Mod I/ Sup  cont water protocol   Bowel/Bladder   Continent of bowel and bladder. LBM 06/01/15  Pt to remain continent of bowel and  bladder  Monitor   Swallow/Nutrition/ Hydration   Dys 3, nectar thick liquids; free water protocol   supervision with least restrictive diet   assess toleration of current diet, use of swallowing precautions, assess readiness for repeat objective swallow study    ADL's   min assist level for transfers and selfcare tasks sit to stand.  Decreased endurance, only tolerating standing tasks for 1-2 mins at the sink before needing rest.   modified indpendent to supervision  selfcare re-training, transfer training, balance training, pt/family education, energy conservation   Mobility   min/supervision transfers and gait x 30', up/down 7 low steps 2 rails, supervision w/c   modified independent  transfers and gait x 100'  activityt tolerance, strengthening, gait, balance, pt ed   Communication             Safety/Cognition/ Behavioral Observations  mild-moderate deficits   supervision   mildly complex tasks    Pain   No c/o pain  <3  Assess for nonverbal cues of pain   Skin   Sacrum, pink, blanchable  No skin breakdown  Encourage pt to reposition q 2hrs      *See Care Plan and progress notes for long and short-term goals.  Barriers to Discharge: anxiety    Possible Resolutions to Barriers:  cont breathing exercises, will not use meds that reduce respiratory drive    Discharge Planning/Teaching Needs:  Home to her home with grown daughter coming to stay with her until she  is independent, daughter also taking car eof pt's 55 yo adopted daughter.      Team Discussion:  Goals supervision-mod/i level main issues is endurance-runs out of gas. Stood 1-2 min at sink before needing a rest break. Water protocol started-on Dsy 3 with nectar thick liquids. Ques if will need home O2-begin monitoring. Anxiety limits her in therapies-becomes more anxious if can't catch her breath.   Revisions to Treatment Plan:  None   Continued Need for Acute Rehabilitation Level of Care: The patient requires daily  medical management by a physician with specialized training in physical medicine and rehabilitation for the following conditions: Daily direction of a multidisciplinary physical rehabilitation program to ensure safe treatment while eliciting the highest outcome that is of practical value to the patient.: Yes Daily medical management of patient stability for increased activity during participation in an intensive rehabilitation regime.: Yes Daily analysis of laboratory values and/or radiology reports with any subsequent need for medication adjustment of medical intervention for : Pulmonary problems;Other  Lucy Chris 06/04/2015, 12:43 PM

## 2015-06-04 NOTE — Progress Notes (Signed)
Occupational Therapy Session Note  Patient Details  Name: Julia Massey MRN: [id number] Date of Birth: 17-Apr-1951  Today's Date: 06/04/2015 OT Individual Time: 2440-1027 OT Individual Time Calculation (min): 31 min    Skilled Therapeutic Interventions/Progress Updates:    Rolled pt to the therapy gym in her wheelchair.  Had her transfer to the mat with the RW.  Pt forgot to lock the wheelchair brakes so needed cueing to correct before attempting second time.  Once brakes were locked, pt completed transfer to EOM with close supervision.  Worked on BUE therex in sitting with supervision and max demonstrational cueing for technique.  Completed 1 set each, 10 repetitions for shoulder flexion and abduction, elbow extension, and horizontal shoulder abduction.  Pt with O2 sats 97% with rest breaks in-between sets and HR increasing to 112 BPM.  Pt educated on proper breathing during exercises but demonstrates difficulty coordinating this.      Therapy Documentation Precautions:  Precautions Precautions: Fall Restrictions Weight Bearing Restrictions: No  Vital Signs: Therapy Vitals Temp: 98 F (36.7 C) Temp Source: Oral Pulse Rate: 89 Resp: 18 BP: (!) 109/59 mmHg Patient Position (if appropriate): Lying Oxygen Therapy SpO2: 98 % O2 Device: Nasal Cannula O2 Flow Rate (L/min): 2 L/min Pulse Oximetry Type: Intermittent Pain: Pain Assessment Pain Assessment: No/denies pain ADL: See Function Navigator for Current Functional Status.   Therapy/Group: Individual Therapy  Addalyne Vandehei OTR/L 06/04/2015, 4:24 PM

## 2015-06-04 NOTE — Progress Notes (Signed)
Physical Therapy Session Note  Patient Details  Name: Julia Massey MRN: [id number] Date of Birth: 1950-08-27  Today's Date: 06/04/2015 PT Individual Time: 0905-1005 PT Individual Time Calculation (min): 60 min   Short Term Goals: Week 1:  PT Short Term Goal 1 (Week 1): Pt will increase bed moiblity to mod I.  PT Short Term Goal 2 (Week 1): Pt will increase transfers bed to chair, chair to bed to S.  PT Short Term Goal 3 (Week 1): Pt will ambulate with LRAD about 50 feet with S.  PT Short Term Goal 4 (Week 1): Pt will propel w/c about 50 feet with S.  PT Short Term Goal 5 (Week 1): Pt will ascend/descend 4 stairs with B rails and S.   Skilled Therapeutic Interventions/Progress Updates:   Pt stated she was breathing more easily now that she is using saline nose drops several times per day.  Pt on 2L O2 via [place], sats >90% at rest. Pt donned shoes in sitting.  With brief standing or propelling w/c x 25', sats drop.  See vitals. Strengthening LE exs in sitting: 10 x 2 marching , 5 x 1 long arc quad ext with ankle pumps at end range. Self stretching bil heel cords and hamstrings in sitting. Gait with RW x 50' , x 75' including 2 turns, min guard / supervision assist.  O2 sat 97% after walk. Up/down (7) 3" high steps: forward step- through, backward descending step-to. sats WNLs, although pt anxious and SOB.  Mod/max cues throughout session for slow deep breathing; pt does not remember this technique without cueing.   Pt left sitting in w/c in room with all needs within reach.  Quick release belt donned. Pt drank thickened liquid safely using swallowing strategies.     Therapy Documentation Precautions:  Precautions Precautions: Fall Restrictions Weight Bearing Restrictions: No   Vital Signs: Therapy Vitals Pulse Rate: (!) 108 Patient Position (if appropriate): Sitting Oxygen Therapy SpO2: (!) 82 % (after propelling w/c with UEs x 25') O2 Device: Nasal Cannula O2 Flow Rate (L/min): 2  L/min Pain: Pain Assessment Pain Score: 5  Pain Location: Back Pain Intervention(s): Refused (pt has chronic LBP due to bulging disc)      See Function Navigator for Current Functional Status.   Therapy/Group: Individual Therapy  Allante Whitmire 06/04/2015, 11:16 AM

## 2015-06-04 NOTE — Progress Notes (Signed)
Speech Language Pathology Daily Session Note  Patient Details  Name: Julia Massey MRN: [id number] Date of Birth: [birth year]  Today's Date: 06/04/2015 SLP Individual Time: 1300-1400 SLP Individual Time Calculation (min): 60 min  Short Term Goals: Week 1: SLP Short Term Goal 1 (Week 1): Pt will complete base of tongue strengthening exercise with Mod assist multimodal cues for accuracy SLP Short Term Goal 2 (Week 1): Pt will utilize chin tuck during PO intake to prevent overt s/s of aspiraiton with Min verbal cues  SLP Short Term Goal 3 (Week 1): Pt will demonstrate basic problem solving during functional and self-care tasks with Min verbal cues SLP Short Term Goal 4 (Week 1): Pt will sustain attention to tasks for 8-10 minutes with Min verbal cues for redirection SLP Short Term Goal 5 (Week 1): Pt will utilize external aids to assist with recall of new information with Min assist question cues SLP Short Term Goal 6 (Week 1): Pt will request help as needed during completion of basic tasks with Min question cues  Skilled Therapeutic Interventions: Pt was an engaged participant in speech therapy this date targeting dysphagia and cognition. Pt able to demonstrate chin-tuck with all boluses independently. No s/s aspiration. Pt self-corrected when she began talking while eating and changed behavior. The nurse tech indicated that the patient followed all  swallow precautions without cueing this a.m. with breakfast. Pt requested to brush teeth so she could have thin water per the free water protocol. Again, no s/s aspiration noted. Pt able to demonstrate pharyngeal strengthening exercises while following handout with occasional assist of verbal description and model. Functional  math/ money counting activity initiated. Pt able to count money with 100% acc at mod I for increased time and occasional self-correction. However, pt did require mod A for verbal cueing related to attention to task due to decreased  working memory. Discussed performance with pt who recognized that her current functioning is not independent for higher level cognitive tasks. Will change meals to intermittent supervision given pt's compliance with swallow precautions.    Function:  Eating Eating   Modified Consistency Diet: Yes Eating Assist Level: Swallowing techniques: self managed           Cognition Comprehension Comprehension assist level: Follows basic conversation/direction with no assist  Expression   Expression assist level: Expresses complex ideas: With extra time/assistive device  Social Interaction Social Interaction assist level: Interacts appropriately with others with medication or extra time (anti-anxiety, antidepressant).  Problem Solving Problem solving assist level: Solves basic 90% of the time/requires cueing < 10% of the time  Memory Memory assist level: Recognizes or recalls 75 - 89% of the time/requires cueing 10 - 24% of the time    Pain Pain Assessment Pain Assessment: No/denies pain  Therapy/Group: Individual Therapy  Rocky Crafts 06/04/2015, 3:48 PM

## 2015-06-04 NOTE — Progress Notes (Signed)
Julia Massey PHYSICAL MEDICINE & REHABILITATION     PROGRESS NOTE    Subjective/Complaints: Patient still desaturating off of O2. She only gets to 1000 mL on her incentive spirometry No shortness of breath at rest. Speech therapy going over pharyngeal as well as respiratory strengthening exercises  ROS: Pt denies, nausea, vomiting, abdominal pain, diarrhea, chest pain, shortness of breath, palpitations, dysuria, dizziness,   bleeding,   or depression. Mild anxiety   Objective: Vital Signs: Blood pressure 97/60, pulse 86, temperature 98.1 F (36.7 C), temperature source Oral, resp. rate 16, height 5' (1.524 m), weight 46.4 kg (102 lb 4.7 oz), SpO2 100 %. No results found.  Recent Labs  06/02/15 0800  WBC 11.2*  HGB 13.6  HCT 42.4  PLT 257    Recent Labs  06/02/15 0800  NA 138  K 3.9  CL 101  GLUCOSE 125*  BUN 16  CREATININE 0.52  CALCIUM 9.7   CBG (last 3)  No results for input(s): GLUCAP in the last 72 hours.  Wt Readings from Last 3 Encounters:  06/03/15 46.4 kg (102 lb 4.7 oz)  05/29/15 47.5 kg (104 lb 11.5 oz)    Physical Exam:  Constitutional:  Frail appearing. No distress  HENT:  Head: Normocephalic and atraumatic.  Eyes: Conjunctivae and EOM are normal. Pupils are equal, round, and reactive to light.  Neck: Normal range of motion. Neck supple. No tracheal deviation present. No thyromegaly present.  Cardiovascular: Regular rhythm.  HR 80's Respiratory: She has no wheezes. She has no rales. No distress. Chest less tender. Good air movement GI: Soft. She exhibits no distension.  Neurological: She is alert. No cranial nerve deficit. Coordination normal.  Makes good eye contact with examiner. Oriented to month and year, place. Improved awareness and insight. Answered biographical questions. No sensory deficits. MMT remains: UE 3/5 delt,bicep,wrist, tricep, 4/5 HI. LE: 3/5 HF, KE and 4/5 ADF/APF.  Skin: Skin is warm and dry.  Psychiatric:   Pleasant and cooperative.   Assessment/Plan: 1. Debilitation/ encephalopathy secondary to acute respiratory failure and multiple medical which require 3+ hours per day of interdisciplinary therapy in a comprehensive inpatient rehab setting. Physiatrist is providing close team supervision and 24 hour management of active medical problems listed below. Physiatrist and rehab team continue to assess barriers to discharge/monitor patient progress toward functional and medical goals.  Function:  Bathing Bathing position   Position: Shower  Bathing parts Body parts bathed by patient: Right arm, Left arm, Chest, Abdomen, Front perineal area, Right upper leg, Buttocks, Left lower leg, Right lower leg, Left upper leg Body parts bathed by helper: Back  Bathing assist Assist Level: Touching or steadying assistance(Pt > 75%)      Upper Body Dressing/Undressing Upper body dressing   What is the patient wearing?: Bra, Pull over shirt/dress Bra - Perfomed by patient: Thread/unthread right bra strap, Thread/unthread left bra strap, Hook/unhook bra (pull down sports bra)   Pull over shirt/dress - Perfomed by patient: Thread/unthread right sleeve, Thread/unthread left sleeve, Put head through opening, Pull shirt over trunk          Upper body assist Assist Level: Supervision or verbal cues      Lower Body Dressing/Undressing Lower body dressing   What is the patient wearing?: Socks, Shoes, [employer]  - Performed by patient: Thread/unthread right underwear leg, Thread/unthread left underwear leg, Pull underwear up/down   Pants- Performed by patient: Thread/unthread right pants leg, Thread/unthread left pants leg, Pull pants up/down  Non-skid slipper socks- Performed by helper: Don/doff right sock, Don/doff left sock Socks - Performed by patient: Don/doff right sock, Don/doff left sock     Shoes - Performed by helper: Don/doff right shoe, Don/doff left shoe, Fasten right, Fasten  left     TED Hose - Performed by patient: Don/doff right TED hose, Don/doff left TED hose    Lower body assist Assist for lower body dressing: Supervision or verbal cues      Toileting Toileting   Toileting steps completed by patient: Adjust clothing prior to toileting, Performs perineal hygiene, Adjust clothing after toileting   Toileting Assistive Devices: Grab bar or rail  Toileting assist Assist level: Touching or steadying assistance (Pt.75%)   Transfers Chair/bed transfer   Chair/bed transfer method: Stand pivot Chair/bed transfer assist level: Touching or steadying assistance (Pt > 75%) Chair/bed transfer assistive device: Armrests     Locomotion Ambulation     Max distance: 12 Assist level: Moderate assist (Pt 50 - 74%)   Wheelchair   Type: Manual Max wheelchair distance: 150 Assist Level: Supervision or verbal cues  Cognition Comprehension Comprehension assist level: Understands complex 90% of the time/cues 10% of the time  Expression Expression assist level: Expresses complex ideas: With no assist  Social Interaction Social Interaction assist level: Interacts appropriately with others with medication or extra time (anti-anxiety, antidepressant).  Problem Solving Problem solving assist level: Solves complex 90% of the time/cues < 10% of the time  Memory Memory assist level: Recognizes or recalls 75 - 89% of the time/requires cueing 10 - 24% of the time   Medical Problem List and Plan:  1. 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 Formulized individual treatment plan based on medical history, underlying problem and comorbidities. 2. DVT  Prophylaxis/Anticoagulation: Subcutaneous heparin indicated at present 3. Pain Management/musculoskeletal chest and back pain: Tylenol as needed.   4. Mood: Continue Prozac 40 mg daily  -xanax prn added 5. Neuropsych: This patient is capable of making decisions on her own behalf. 6. Skin/Wound Care: Routine skin checks 7. Fluids/Electrolytes/Nutrition: Routine I's and O's. Encourage PO.  8. Tobacco abuse  Provide counseling.   9. Dysphagia. Dysphagia #3 nectar thick liquids. Follow-up speech therapy.   -encourage fluids   10. Chest wall pain/respiratory/COPD:  musculoskeletal. Ice applied, analgesics, monitor for now.  -she is using IS, respiratory muscle strengthening with speech therapy  -reviewed breathing techniques and discussed anxiety impact also  -oxygen as needed  11. Family history of CAD, would check EKG if she has recurrent shortness of breath/chest pain LOS (Days) 5 A FACE TO FACE EVALUATION WAS PERFORMED  Alysia Penna E 06/04/2015 8:55 AM

## 2015-06-04 NOTE — Progress Notes (Signed)
Occupational Therapy Session Note  Patient Details  Name: Julia Massey MRN: [id number] Date of Birth: 1951-03-19  Today's Date: 06/04/2015 OT Individual Time: 1000-1100 OT Individual Time Calculation (min): 60 min    Short Term Goals: Week 1:  OT Short Term Goal 1 (Week 1): Pt. will be SBA with standing balance in shower OT Short Term Goal 2 (Week 1): Pt will bathe with SBA OT Short Term Goal 3 (Week 1): Pt. will dress with SBA OT Short Term Goal 4 (Week 1): Pt. will demonstrate SBa transfers to toilet OT Short Term Goal 5 (Week 1): Pt. will engaged in shower with SBA  Skilled Therapeutic Interventions/Progress Updates:    1:1 self care retraining at shower level. Pt ambulated into bathroom with RW with close supervision and was able to manage uneven threshold and opening the door with extra time.  Pt required min cues for sequencing and task organization throughout session. Pt doffed shirt and LB clothing and got into the shower - forgetting to doff her bra until a VC before spraying herself. Pt with one LOB turning around to sit on seat requiring min A to regain balance. Pt returned to EOB and dressed with close supervision.  Pt with elevated HR throughout session - above 100 when checked 3x during session. Requested to lay down after after session.     Therapy Documentation Precautions:  Precautions Precautions: Fall Restrictions Weight Bearing Restrictions: No Pain: Pain Assessment Pain Assessment: No/denies pain  See Function Navigator for Current Functional Status.   Therapy/Group: Individual Therapy  Julia Massey [facility] 06/04/2015, 3:50 PM

## 2015-06-04 NOTE — Progress Notes (Signed)
Social Work Patient ID: Julia Massey, female   DOB: December 30, 1950, 65 y.o.   MRN: [id number] Met with pt to discuss team conference goals-supervision/mod/i level and discharge 2/3.  She is pleased with the goals and her discharge being before her [birth year] on [birth year]. She is hoping her strength will improve and she will not require home O2, she doesn't want to have to drag that around with her. Her daughter will come in and attend therapies with Pt prior to discharge. She is worried about her 39 yo dog going to the vet today for some health sisues, she doesn't want him to suffer and may have to have put down, but feels bad for her daughter. She will know more later today.

## 2015-06-05 ENCOUNTER — Inpatient Hospital Stay (HOSPITAL_COMMUNITY): Payer: BLUE CROSS/BLUE SHIELD

## 2015-06-05 ENCOUNTER — Inpatient Hospital Stay (HOSPITAL_COMMUNITY): Payer: BLUE CROSS/BLUE SHIELD | Admitting: Occupational Therapy

## 2015-06-05 ENCOUNTER — Inpatient Hospital Stay (HOSPITAL_COMMUNITY): Payer: BLUE CROSS/BLUE SHIELD | Admitting: Speech Pathology

## 2015-06-05 LAB — BASIC METABOLIC PANEL
ANION GAP: 7 (ref 5–15)
BUN: 10 mg/dL (ref 6–20)
CHLORIDE: 100 mmol/L — AB (ref 101–111)
CO2: 30 mmol/L (ref 22–32)
Calcium: 9.7 mg/dL (ref 8.9–10.3)
Creatinine, Ser: 0.52 mg/dL (ref 0.44–1.00)
Glucose, Bld: 91 mg/dL (ref 65–99)
POTASSIUM: 4.1 mmol/L (ref 3.5–5.1)
SODIUM: 137 mmol/L (ref 135–145)

## 2015-06-05 MED ORDER — HYDROCODONE-ACETAMINOPHEN 5-325 MG PO TABS
1.0000 | ORAL_TABLET | Freq: Four times a day (QID) | ORAL | Status: DC | PRN
  Administered 2015-06-05 – 2015-06-11 (×12): 1 via ORAL
  Filled 2015-06-05 (×13): qty 1

## 2015-06-05 NOTE — Progress Notes (Signed)
Julia Massey PHYSICAL MEDICINE & REHABILITATION     PROGRESS NOTE    Subjective/Complaints: Pt is aware and pleased with d/c date.  Concerned that RN "heard something" during auscultation of lungs  ROS: Pt denies, nausea, vomiting, abdominal pain, diarrhea, chest pain, shortness of breath, palpitations, dysuria,. Mild anxiety   Objective: Vital Signs: Blood pressure 109/59, pulse 89, temperature 98 F (36.7 C), temperature source Oral, resp. rate 18, height 5' (1.524 m), weight 46.72 kg (103 lb), SpO2 98 %. No results found. No results for input(s): WBC, HGB, HCT, PLT in the last 72 hours. No results for input(s): NA, K, CL, GLUCOSE, BUN, CREATININE, CALCIUM in the last 72 hours.  Invalid input(s): CO CBG (last 3)  No results for input(s): GLUCAP in the last 72 hours.  Wt Readings from Last 3 Encounters:  06/04/15 46.72 kg (103 lb)  05/29/15 47.5 kg (104 lb 11.5 oz)    Physical Exam:  Constitutional:  Frail appearing. No distress  HENT:  Head: Normocephalic and atraumatic.  Eyes: Conjunctivae and EOM are normal. Pupils are equal, round, and reactive to light.  Neck: Normal range of motion. Neck supple. No tracheal deviation present. No thyromegaly present.  Cardiovascular: Regular rhythm. Reg rate  Respiratory: She has no wheezes. She has no rales. No distress. Chest less tender. Good air movement GI: Soft. She exhibits no distension.  Neurological: She is alert. No cranial nerve deficit. Coordination normal.   Oriented to month and year, place. Improved awareness and insight. Answered biographical questions. No sensory deficits. MMT remains: UE 3/5 delt,bicep,wrist, tricep, 4/5 HI. LE: 3/5 HF, KE and 4/5 ADF/APF.  Skin: Skin is warm and dry.  Psychiatric:  Pleasant and cooperative.   Assessment/Plan: 1. Debilitation/ encephalopathy secondary to acute respiratory failure and multiple medical which require 3+ hours per day of interdisciplinary therapy in a  comprehensive inpatient rehab setting. Physiatrist is providing close team supervision and 24 hour management of active medical problems listed below. Physiatrist and rehab team continue to assess barriers to discharge/monitor patient progress toward functional and medical goals.  Function:  Bathing Bathing position   Position: Shower  Bathing parts Body parts bathed by patient: Right arm, Left arm, Chest, Abdomen, Front perineal area, Right upper leg, Buttocks, Left lower leg, Right lower leg, Left upper leg Body parts bathed by helper: Back  Bathing assist Assist Level: Touching or steadying assistance(Pt > 75%)      Upper Body Dressing/Undressing Upper body dressing   What is the patient wearing?: Bra, Pull over shirt/dress Bra - Perfomed by patient: Thread/unthread right bra strap, Thread/unthread left bra strap, Hook/unhook bra (pull down sports bra)   Pull over shirt/dress - Perfomed by patient: Thread/unthread right sleeve, Thread/unthread left sleeve, Put head through opening, Pull shirt over trunk          Upper body assist Assist Level: Supervision or verbal cues      Lower Body Dressing/Undressing Lower body dressing   What is the patient wearing?: Socks, Shoes, [employer] - Performed by patient: Thread/unthread right underwear leg, Thread/unthread left underwear leg, Pull underwear up/down   Pants- Performed by patient: Thread/unthread right pants leg, Thread/unthread left pants leg, Pull pants up/down     Non-skid slipper socks- Performed by helper: Don/doff right sock, Don/doff left sock Socks - Performed by patient: Don/doff right sock, Don/doff left sock     Shoes - Performed by helper: Don/doff right shoe, Don/doff left shoe, Fasten right, Fasten left     TED  Hose - Performed by patient: Don/doff right TED hose, Don/doff left TED hose    Lower body assist Assist for lower body dressing: Supervision or verbal cues      Toileting Toileting    Toileting steps completed by patient: Adjust clothing prior to toileting, Performs perineal hygiene, Adjust clothing after toileting   Toileting Assistive Devices: Grab bar or rail  Toileting assist Assist level: Touching or steadying assistance (Pt.75%)   Transfers Chair/bed transfer   Chair/bed transfer method: Stand pivot Chair/bed transfer assist level: Touching or steadying assistance (Pt > 75%) Chair/bed transfer assistive device: Armrests     Locomotion Ambulation     Max distance: 50 Assist level: Supervision or verbal cues   Wheelchair   Type: Manual Max wheelchair distance: 50 Assist Level: Supervision or verbal cues  Cognition Comprehension Comprehension assist level: Follows basic conversation/direction with no assist  Expression Expression assist level: Expresses complex ideas: With extra time/assistive device  Social Interaction Social Interaction assist level: Interacts appropriately with others with medication or extra time (anti-anxiety, antidepressant).  Problem Solving Problem solving assist level: Solves basic 90% of the time/requires cueing < 10% of the time  Memory Memory assist level: Recognizes or recalls 75 - 89% of the time/requires cueing 10 - 24% of the time   Medical Problem List and Plan:  1. Debilitation with encephalopathy secondary to acute respiratory failure, orientation improving, Cont rehab, proximally 1 more week as inpatient  - 2. DVT Prophylaxis/Anticoagulation: Subcutaneous heparin indicated at present 3. Pain Management/musculoskeletal chest and back pain: Tylenol as needed.   4. Mood: Continue Prozac 40 mg daily  -xanax prn added 5. Neuropsych: This patient is capable of making decisions on her own behalf. 6. Skin/Wound Care: Routine skin checks 7. Fluids/Electrolytes/Nutrition: Routine I's and O's. 25-30% meals, protein supp, fluid , recheck BMET 8. Tobacco abuse  Provide counseling.   9. Dysphagia. Dysphagia #3 nectar  thick liquids. Follow-up speech therapy.   -encourage fluids   10. Chest wall pain/respiratory/COPD:  musculoskeletal. Ice applied, analgesics, monitor for now.patient is compliant with incentive spirometry  -she is using IS, respiratory muscle strengthening with speech therapy  -reviewed breathing techniques and discussed anxiety impact also  -oxygen as needed  LOS (Days) 6 A FACE TO FACE EVALUATION WAS PERFORMED  Erick Colace 06/05/2015 8:39 AM

## 2015-06-05 NOTE — Progress Notes (Signed)
Occupational Therapy Session Note  Patient Details  Name: Julia Massey MRN: [id number] Date of Birth: [birth year]  Today's Date: 06/05/2015 OT Individual Time: 1500-1540 OT Individual Time Calculation (min): 40 min    Short Term Goals: Week 1:  OT Short Term Goal 1 (Week 1): Pt. will be SBA with standing balance in shower OT Short Term Goal 2 (Week 1): Pt will bathe with SBA OT Short Term Goal 3 (Week 1): Pt. will dress with SBA OT Short Term Goal 4 (Week 1): Pt. will demonstrate SBa transfers to toilet OT Short Term Goal 5 (Week 1): Pt. will engaged in shower with SBA  Skilled Therapeutic Interventions/Progress Updates: Upon approach for OT patient was seated EOB and began UE endurance exercises seated with supervision.   Then she voiced the need to toilet.   She was able to demonstrate safety skills to mange her Oxygen tubing and complete functional mobility via RW for EOB bed to/from toilet transfer with distant S/Stand by assist.   She completed toileting, including pericleansing and clothing balancing self with walker when fatigued with distant Supervision.   She concluded her session with more endurance activities seated EOB.    Patient was left seated safely EOB with call bell, phone and gingerale Nectar thick in place.     Therapy Documentation Precautions:  Precautions Precautions: Fall Restrictions Weight Bearing Restrictions: No  Pain:denied    See Function Navigator for Current Functional Status.   Therapy/Group: Individual Therapy  Bud Face [facility] 06/05/2015, 5:52 PM

## 2015-06-05 NOTE — Progress Notes (Signed)
Physical Therapy Session Note  Patient Details  Name: Julia Massey MRN: [id number] Date of Birth: May 10, 1951  Today's Date: 06/05/2015 PT Individual Time:0900  - 1000, 60 min    Short Term Goals: Week 1:  PT Short Term Goal 1 (Week 1): Pt will increase bed moiblity to mod I.  PT Short Term Goal 2 (Week 1): Pt will increase transfers bed to chair, chair to bed to S.  PT Short Term Goal 3 (Week 1): Pt will ambulate with LRAD about 50 feet with S.  PT Short Term Goal 4 (Week 1): Pt will propel w/c about 50 feet with S.  PT Short Term Goal 5 (Week 1): Pt will ascend/descend 4 stairs with B rails and S.     Skilled Therapeutic Interventions/Progress Updates:   PT donned TEDs, pt donned shoes in sitting.  Cues to stay on task as pt distracts herself with chatter.   Therapeutic exercise performed with LE to increase strength for functional mobility: w/c propulsion x 100' ; in sitting, 10 x 1 each marching, long arc quad knee ext with ankle pumps at end range, bil hip adduction against resistance for core activation, all without UE support. [place] exs in standing without weights: 15x 2 each R/L hip abduction, hamstring curls, calf raiises, mini squats.  Pt had difficulty with toe raises in standing due to heel cord tightness.  Mod cues to perform R/L seated heel cord stretch, R/L x 30 seconds each. Pt required multiple seated rest breaks throughout session due to DOE.   Gait with RW x 50' x 2  including 2 turns, and up/down 4 6" high steps 2 rails, close supervision.  O2 sats above 90% and HR 118-122.  Cues for pt to stop talking and breathe slowly. Pt c/o "legs feeling weak" after steps.  Pt returned to room and left resting in w/c with all needs within reach, quick release belt donned.    Therapy Documentation Precautions:  Precautions Precautions: Fall Restrictions Weight Bearing Restrictions: No   Vital Signs: Therapy Vitals Pulse Rate: 96 Patient Position (if appropriate):  Sitting Oxygen Therapy SpO2: 96 % O2 Device: Nasal Cannula O2 Flow Rate (L/min): 2 L/min Pain: Pain Assessment Pain Score: 5  Pain Location: Back Pain Orientation: Lower Pain Intervention(s): Medication (See eMAR)       See Function Navigator for Current Functional Status.   Therapy/Group: Individual Therapy  Aleister Lady 06/05/2015, 9:22 AM

## 2015-06-05 NOTE — Progress Notes (Signed)
Occupational Therapy Session Note  Patient Details  Name: Julia Massey MRN: [id number] Date of Birth: [birth year]  Today's Date: 06/05/2015 OT Individual Time: 1020-1105 OT Individual Time Calculation (min): 45 min   Short Term Goals: Week 1:  OT Short Term Goal 1 (Week 1): Pt. will be SBA with standing balance in shower OT Short Term Goal 2 (Week 1): Pt will bathe with SBA OT Short Term Goal 3 (Week 1): Pt. will dress with SBA OT Short Term Goal 4 (Week 1): Pt. will demonstrate SBa transfers to toilet OT Short Term Goal 5 (Week 1): Pt. will engaged in shower with SBA  Skilled Therapeutic Interventions/Progress Updates:  Pain Assessment: No complaints of pain, "I'm good".  Pt found seated in w/c beside bed. Pt commented that she showered yesterday and didn't need to today, but with minimal encouragement from therapist pt willing to complete shower level ADL. Pt gathered necessary items from w/c level, doffing TEDS and shoes and donning yellow gripper socks. Pt then stood with RW and ambulated into BR for toilet transfer, toileting, then shower stall transfer. Pt completed UB/LB bathing in sit to/from stand level using shower seat with back and hand held shower; grab bars used prn. After shower, pt dried and ambulated to w/c for UB/LB dressing. During functional ambulation/mobility and functional transfers using RW, pt requires min verbal cues for safety, hand placement, and sequencing. Pt sat at sink for grooming task of brushing teeth, therapist then administered small cup of water per water protocol sheet; pt able to verbalize understanding of small sips and tucking chin for each sip.   After shower, on 2L/min supplemental 02, pt's sats =97%. Doffed supplemental 02 and pt ambulated to w/c, sats on RA=93%. During activity and while on RA, sats =92%.  Patient's HR ran in high 90's to low 100's entire session.   At end of session, left patient supine in bed with all needs within reach and bed  alarm set. Donned 2 L.min supplemental 02 via [place] per patient request.   Therapy Documentation Precautions:  Precautions Precautions: Fall Restrictions Weight Bearing Restrictions: No  See Function Navigator for Current Functional Status.  Therapy/Group: Individual Therapy  Edwin Cap , MS, OTR/L, CLT Pager: [phone]  06/05/2015, 11:08 AM

## 2015-06-05 NOTE — Progress Notes (Signed)
Speech Language Pathology Daily Session Note  Patient Details  Name: Julia Massey MRN: [id number] Date of Birth: [birth year]  Today's Date: 06/05/2015 SLP Group Time: 1300-1400 SLP Group Time Calculation (min): 60 min  Short Term Goals: Week 1: SLP Short Term Goal 1 (Week 1): Pt will complete base of tongue strengthening exercise with Mod assist multimodal cues for accuracy SLP Short Term Goal 2 (Week 1): Pt will utilize chin tuck during PO intake to prevent overt s/s of aspiraiton with Min verbal cues  SLP Short Term Goal 3 (Week 1): Pt will demonstrate basic problem solving during functional and self-care tasks with Min verbal cues SLP Short Term Goal 4 (Week 1): Pt will sustain attention to tasks for 8-10 minutes with Min verbal cues for redirection SLP Short Term Goal 5 (Week 1): Pt will utilize external aids to assist with recall of new information with Min assist question cues SLP Short Term Goal 6 (Week 1): Pt will request help as needed during completion of basic tasks with Min question cues  Skilled Therapeutic Interventions:  Pt was seen for skilled group ST targeting goals for cognition and dysphagia.  SLP facilitated the session with trials of thin liquids during a functional meal to continue working towards liquids advancement.  Pt was able to utilize chin tuck with supervision cues and demonstrated x1 immediate cough which she appropriately attributed to talking while drinking and not timing chin tuck correctly.  Recommend repeat MBS at next available appointment.  SLP also facilitated the session with a card game targeting functional problem solving and sustained attention to task.  Pt required mod assist verbal cues to plan and execute a problem solving strategy during the abovementioned task and was able to sustain her attention to task for ~15 minutes with min assist verbal cues for redirection.  Pt was returned to room and left in bed with bed alarm set and call bell within  reach.  Continue per current plan of care.     Function:  Eating Eating   Modified Consistency Diet: Yes Eating Assist Level: Supervision or verbal cues           Cognition Comprehension Comprehension assist level: Follows basic conversation/direction with no assist  Expression   Expression assist level: Expresses complex ideas: With extra time/assistive device  Social Interaction Social Interaction assist level: Interacts appropriately with others with medication or extra time (anti-anxiety, antidepressant).  Problem Solving Problem solving assist level: Solves basic 90% of the time/requires cueing < 10% of the time  Memory Memory assist level: Recognizes or recalls 75 - 89% of the time/requires cueing 10 - 24% of the time    Pain Pain Assessment Pain Assessment: No/denies pain  Therapy/Group: Group Therapy  Jatasia Gundrum, Melanee Spry 06/05/2015, 6:08 PM

## 2015-06-06 ENCOUNTER — Inpatient Hospital Stay (HOSPITAL_COMMUNITY): Payer: BLUE CROSS/BLUE SHIELD

## 2015-06-06 ENCOUNTER — Inpatient Hospital Stay (HOSPITAL_COMMUNITY): Payer: BLUE CROSS/BLUE SHIELD | Admitting: Speech Pathology

## 2015-06-06 ENCOUNTER — Inpatient Hospital Stay (HOSPITAL_COMMUNITY): Payer: BLUE CROSS/BLUE SHIELD | Admitting: Physical Therapy

## 2015-06-06 ENCOUNTER — Inpatient Hospital Stay (HOSPITAL_COMMUNITY): Payer: BLUE CROSS/BLUE SHIELD | Admitting: Occupational Therapy

## 2015-06-06 NOTE — Progress Notes (Signed)
Occupational Therapy Session Note  Patient Details  Name: SAMARIA ANES MRN: [id number] Date of Birth: 1950/10/10  Today's Date: 06/06/2015 OT Individual Time: 1100-1200 OT Individual Time Calculation (min): 60 min    Short Term Goals: Week 1:  OT Short Term Goal 1 (Week 1): Pt. will be SBA with standing balance in shower OT Short Term Goal 2 (Week 1): Pt will bathe with SBA OT Short Term Goal 3 (Week 1): Pt. will dress with SBA OT Short Term Goal 4 (Week 1): Pt. will demonstrate SBa transfers to toilet OT Short Term Goal 5 (Week 1): Pt. will engaged in shower with SBA      Skilled Therapeutic Interventions/Progress Updates:    Pt seen for skilled OT to facilitate functional mobility, activity tolerance, balance, and memory skills during ADL self care training. Pt received in w/c on 2L of O2 at 100% sat rate. Pt did voice desire to wean of O2 to have more independence at home.  Discussed with pt trialing a short period of time without O2 during self care. Pt did want to try. Pt ambulated to bathroom with close S and then was able to toilet independently, after transferring to shower with S she bathed mod I. After shower, O2 sats at 96% and HR 90. Pt continued to dress and get ready for the day. Her O2 levels continued at 96% on room air and pt stated she felt very good. Discussed with pt placing her back on 1L instead of 2 to not overtax her with just room air. Pt on 1L and after 10 min of activity in standing, moving around the room with S, O2 sat rate 100%. Pt did need a few cues to lock w/c breaks prior to standing up. Pt stated she felt very relaxed at the end of the session and was satisfied with her progress. Pt in room with all needs met. Report to her RN pt now on 1L of O2.  Therapy Documentation Precautions:  Precautions Precautions: Fall Restrictions Weight Bearing Restrictions: No    Vital Signs: Oxygen Therapy SpO2: 100 % O2 Flow Rate (L/min): 1 L/min Pulse Oximetry  Type: Intermittent Pain: Pain Assessment Pain Assessment: 0-10 Pain Score: 6  Pain Type: Acute pain Pain Location: Back Pain Orientation: Lower Pain Descriptors / Indicators: Aching Pain Frequency: Intermittent Pain Onset: On-going Patients Stated Pain Goal: 2 Pain Intervention(s): Medication (See eMAR);Repositioned Multiple Pain Sites: No   ADL:  See Function Navigator for Current Functional Status.   Therapy/Group: Individual Therapy  SAGUIER,JULIA 06/06/2015, 12:20 PM

## 2015-06-06 NOTE — Progress Notes (Signed)
Speech Language Pathology Weekly Progress and Session Note  Patient Details  Name: Julia Massey MRN: [id number] Date of Birth: 02/03/1951  Beginning of progress report period: May 30, 2015   End of progress report period: June 06, 2015  Today's Date: 06/06/2015 SLP Individual Time: 2119-4174; 1449-1530 SLP Individual Time Calculation (min): 15 min; 41 min   Short Term Goals: Week 1: SLP Short Term Goal 1 (Week 1): Pt will complete base of tongue strengthening exercise with Mod assist multimodal cues for accuracy SLP Short Term Goal 1 - Progress (Week 1): Met SLP Short Term Goal 2 (Week 1): Pt will utilize chin tuck during PO intake to prevent overt s/s of aspiraiton with Min verbal cues  SLP Short Term Goal 2 - Progress (Week 1): Met SLP Short Term Goal 3 (Week 1): Pt will demonstrate basic problem solving during functional and self-care tasks with Min verbal cues SLP Short Term Goal 3 - Progress (Week 1): Met SLP Short Term Goal 4 (Week 1): Pt will sustain attention to tasks for 8-10 minutes with Min verbal cues for redirection SLP Short Term Goal 4 - Progress (Week 1): Met SLP Short Term Goal 5 (Week 1): Pt will utilize external aids to assist with recall of new information with Min assist question cues SLP Short Term Goal 5 - Progress (Week 1): Met SLP Short Term Goal 6 (Week 1): Pt will request help as needed during completion of basic tasks with Min question cues SLP Short Term Goal 6 - Progress (Week 1): Met    New Short Term Goals: Week 2: SLP Short Term Goal 1 (Week 2): Pt will consume regular textures and thin liquids with minimal overt s/s of aspiration and mod I use of swallowing precautions over 2 consecutive sessions.  SLP Short Term Goal 2 (Week 2): Pt will complete base of tongue strengthening exercises with mod I for 25 repetitions.   Weekly Progress Updates:  Pt made functional gains this reporting period and has met 6 out of 6 short term goals.  Pt's diet  has been advanced to regular textures and thin liquids and pt is utilizing swallowing precautions with min assist-supervision depending on environmental distractions.  Pt's cognition has also improved from initial evaluation and suspect that residual impulsivity and distractibility are baseline personality traits per pt's report. Pt would continue to benefit from brief ST follow up while inpatient in order to further assess toleration of diet advancement.  Pt will likely not need ST follow up at discharge.      Intensity: Minumum of 1-2 x/day, 30 to 90 minutes Frequency: 3 to 5 out of 7 days Duration/Length of Stay: 10-12 days  Treatment/Interventions: Cueing hierarchy;Cognitive remediation/compensation;Dysphagia/aspiration precaution training;Environmental controls;Functional tasks;Internal/external aids;Patient/family education;Therapeutic Activities   Daily Session  Skilled Therapeutic Interventions:  Session 1:  Pt was seen for skilled ST make up session targeting dysphagia goals.  SLP facilitated the session with skilled observations completed during presentations of pt's recent diet upgrade.  Pt utilized swallowing precautions with mod I and demonstrated no overt s/s of aspiration with solids or liquids.  Pt reports being slightly anxious about diet upgrade and will likely be very cautious when consuming both solids and liquids.  SLP discussed recommended swallowing precautions post MBS and general esophageal precautions given that pt has history of swallowing difficulty due to presbyesophagus.  Pt verbalized understanding and all questions were answered to her satisfaction at this time.  Session 2:  Pt was seen for skilled ST targeting cognitive  goals.  Pt initially impulsive with movement upon arrival and required mod verbal cues for safety when propelling wheelchair around her room.  SLP facilitated the session with a medication management task targeting functional problem solving.  Pt  recalled function of medications when named for 100% accuracy with mod I and loaded pills of varying dosages and frequencies into a pill box with mod I.   Pt reports that she is at baseline for cognition and was impulsive and distractible at baseline, even since childhood.  Pt was returned to room and left in wheelchair with call bell within reach  Goals updated on this date to reflect current progress and plan of care.     Function:   Eating Eating   Modified Consistency Diet: No Eating Assist Level: More than reasonable amount of time           Cognition Comprehension Comprehension assist level: Follows complex conversation/direction with no assist  Expression   Expression assist level: Expresses complex ideas: With no assist  Social Interaction Social Interaction assist level: Interacts appropriately with others with medication or extra time (anti-anxiety, antidepressant).  Problem Solving Problem solving assist level: Solves basic problems with no assist  Memory Memory assist level: Recognizes or recalls 75 - 89% of the time/requires cueing 10 - 24% of the time   General    Pain Pain Assessment Pain Assessment: No/denies pain  Therapy/Group: Individual Therapy  Tameaka Eichhorn, Selinda Orion 06/06/2015, 4:23 PM

## 2015-06-06 NOTE — Progress Notes (Signed)
Physical Therapy Note  Patient Details  Name: Julia Massey MRN: [id number] Date of Birth: 1951/01/06 Today's Date: 06/06/2015    Time 1: 730-745 15 minutes  1:1 No c/o pain. Pt states she does not want to have therapy this early in the morning. PT attempts to encourage pt to participate but pt states she wants to wait until after her swallow test. Pt does state need to use restroom. Pt gait to bathroom and performed transfers with RW with supervision. Supervision for standing balance to wash hands and brush teeth. Pt states she will do PT later.   Time 2: 1005-1035 30 minutes  1:1 No c/o pain. Pt willing to participate in this PT session. Pt performed gait 2 x 120' with RW with supervision with HR 95 and spO2 99% on 2LO2.  Pt performed gait without AD with steadying assist 50', 100' without LOB with spO2 99%.  Pt performed 10 x sit to stand x 2 for LE strength and endurance. Pt still continues to require frequent rest breaks with cues for breathing to decrease anxiety, but was able to gait without AD today.   Sherrian Nunnelley 06/06/2015, 10:35 AM

## 2015-06-06 NOTE — Progress Notes (Signed)
Physical Therapy Weekly Progress Note  Patient Details  Name: Julia Massey MRN: [id number] Date of Birth: 1950/06/14  Beginning of progress report period: May 31, 2015 End of progress report period: June 06, 2015   Patient has met 3 of 5 short term goals.  Pt continues to need min A for stair negotiation and w/c goals have been d/c'd due to pt being ambulatory for mobility at d/c.  Patient continues to demonstrate the following deficits: decrease activity tolerance, impaired balance and strength and therefore will continue to benefit from skilled PT intervention to enhance overall performance with activity tolerance, balance and ability to compensate for deficits.  Patient progressing toward long term goals..  Continue plan of care.  PT Short Term Goals Week 1:  PT Short Term Goal 1 (Week 1): Pt will increase bed moiblity to mod I.  PT Short Term Goal 1 - Progress (Week 1): Met PT Short Term Goal 2 (Week 1): Pt will increase transfers bed to chair, chair to bed to S.  PT Short Term Goal 2 - Progress (Week 1): Met PT Short Term Goal 3 (Week 1): Pt will ambulate with LRAD about 50 feet with S.  PT Short Term Goal 3 - Progress (Week 1): Met PT Short Term Goal 4 (Week 1): Pt will propel w/c about 50 feet with S.  PT Short Term Goal 4 - Progress (Week 1): Progressing toward goal PT Short Term Goal 5 (Week 1): Pt will ascend/descend 4 stairs with B rails and S.  PT Short Term Goal 5 - Progress (Week 1): Progressing toward goal  Skilled Therapeutic Interventions/Progress Updates:  Ambulation/gait training;Community reintegration;Discharge planning;Functional mobility training;DME/adaptive equipment instruction;Neuromuscular re-education;Patient/family education;Splinting/orthotics;Stair training;UE/LE Strength taining/ROM;Wheelchair propulsion/positioning;UE/LE Coordination activities;Therapeutic Activities;Therapeutic Exercise      See Function Navigator for Current Functional  Status.  [name] 06/06/2015, 10:41 AM

## 2015-06-06 NOTE — Progress Notes (Signed)
[facility] PHYSICAL MEDICINE & REHABILITATION     PROGRESS NOTE    Subjective/Complaints: Patient still using oxygen 24 7 Going for modified barium swallow today.  ROS: Pt denies, nausea, vomiting, abdominal pain, diarrhea, chest pain, shortness of breath, palpitations, dysuria,. Mild anxiety   Objective: Vital Signs: Blood pressure 120/75, pulse 84, temperature 98.1 F (36.7 C), temperature source Oral, resp. rate 18, height 5' (1.524 m), weight 46.2 kg (101 lb 13.6 oz), SpO2 100 %. No results found. No results for input(s): WBC, HGB, HCT, PLT in the last 72 hours.  Recent Labs  06/05/15 1105  NA 137  K 4.1  CL 100*  GLUCOSE 91  BUN 10  CREATININE 0.52  CALCIUM 9.7   CBG (last 3)  No results for input(s): GLUCAP in the last 72 hours.  Wt Readings from Last 3 Encounters:  06/06/15 46.2 kg (101 lb 13.6 oz)  05/29/15 47.5 kg (104 lb 11.5 oz)    Physical Exam:  Constitutional:  Frail appearing. No distress  HENT:  Head: Normocephalic and atraumatic.  Eyes: Conjunctivae and EOM are normal. Pupils are equal, round, and reactive to light.  Neck: Normal range of motion. Neck supple. No tracheal deviation present. No thyromegaly present.  Cardiovascular: Regular rhythm. Reg rate  Respiratory: She has no wheezes. She has no rales. No distress. Chest less tender. Good air movement GI: Soft. She exhibits no distension.  Neurological: She is alert. No cranial nerve deficit. Coordination normal.   Oriented to month and year, place. Improved awareness and insight. Answered biographical questions. No sensory deficits. MMT remains: UE 3/5 delt,bicep,wrist, tricep, 4/5 HI. LE: 3/5 HF, KE and 4/5 ADF/APF.  Skin: Skin is warm and dry.  Psychiatric:  Pleasant and cooperative.   Assessment/Plan: 1. Debilitation/ encephalopathy secondary to acute respiratory failure and multiple medical which require 3+ hours per day of interdisciplinary therapy in a comprehensive  inpatient rehab setting. Physiatrist is providing close team supervision and 24 hour management of active medical problems listed below. Physiatrist and rehab team continue to assess barriers to discharge/monitor patient progress toward functional and medical goals.  Function:  Bathing Bathing position   Position: Shower  Bathing parts Body parts bathed by patient: Right arm, Left arm, Chest, Abdomen, Front perineal area, Right upper leg, Buttocks, Left lower leg, Right lower leg, Left upper leg Body parts bathed by helper: Back  Bathing assist Assist Level: Supervision or verbal cues      Upper Body Dressing/Undressing Upper body dressing   What is the patient wearing?: Bra, Pull over shirt/dress Bra - Perfomed by patient: Thread/unthread right bra strap, Thread/unthread left bra strap, Hook/unhook bra (pull down sports bra)   Pull over shirt/dress - Perfomed by patient: Thread/unthread right sleeve, Thread/unthread left sleeve, Put head through opening, Pull shirt over trunk          Upper body assist Assist Level: Supervision or verbal cues      Lower Body Dressing/Undressing Lower body dressing   What is the patient wearing?: Socks, Shoes, [address], Underwear, Magazine features editor - Performed by patient: Thread/unthread right underwear leg, Thread/unthread left underwear leg, Pull underwear up/down   Pants- Performed by patient: Thread/unthread right pants leg, Thread/unthread left pants leg, Pull pants up/down     Non-skid slipper socks- Performed by helper: Don/doff right sock, Don/doff left sock Socks - Performed by patient: Don/doff right sock, Don/doff left sock     Shoes - Performed by helper: Don/doff right shoe, Don/doff left shoe, Fasten right,  Fasten left     TED Hose - Performed by patient: Don/doff right TED hose, Don/doff left TED hose    Lower body assist Assist for lower body dressing: Supervision or verbal cues (supervision for sit to/from stand)       Toileting Toileting   Toileting steps completed by patient: Adjust clothing prior to toileting, Performs perineal hygiene, Adjust clothing after toileting   Toileting Assistive Devices: Grab bar or rail  Toileting assist Assist level: Supervision or verbal cues   Transfers Chair/bed transfer   Chair/bed transfer method: Stand pivot Chair/bed transfer assist level: Touching or steadying assistance (Pt > 75%) Chair/bed transfer assistive device: Armrests     Locomotion Ambulation     Max distance: 50 Assist level: Supervision or verbal cues   Wheelchair   Type: Manual Max wheelchair distance: 50 Assist Level: Supervision or verbal cues  Cognition Comprehension Comprehension assist level: Follows complex conversation/direction with no assist  Expression Expression assist level: Expresses complex ideas: With no assist  Social Interaction Social Interaction assist level: Interacts appropriately with others with medication or extra time (anti-anxiety, antidepressant).  Problem Solving Problem solving assist level: Solves complex 90% of the time/cues < 10% of the time  Memory Memory assist level: Recognizes or recalls 75 - 89% of the time/requires cueing 10 - 24% of the time   Medical Problem List and Plan:  1. Debilitation with encephalopathy secondary to acute respiratory failure, orientation improving  - 2. DVT Prophylaxis/Anticoagulation: Subcutaneous heparin indicated at present 3. Pain Management/musculoskeletal chest and back pain: Tylenol as needed.  Hydrocodone when necessary more severe pain, patient states this is helpful, would not DC home with this given respiratory issues 4. Mood: Continue Prozac 40 mg daily  -xanax prn added 5. Neuropsych: This patient is capable of making decisions on her own behalf. 6. Skin/Wound Care: Routine skin checks 7. Fluids/Electrolytes/Nutrition: Routine I's and O's. 25-30% meals, protein supp, fluid , recheck BMET 8. Tobacco  abuse  Provide counseling.   9. Dysphagia. Dysphagia #3 nectar thick liquids.repeat modified barium swallow today  -encourage fluids   10. Chest wall pain/respiratory/COPD:  musculoskeletal. Ice applied, analgesics, monitor for now.patient is compliant with incentive spirometry  -she is using IS, respiratory muscle strengthening with speech therapy  -reviewed breathing techniques and discussed anxiety impact also  -oxygen as needed, anticipate need for home oxygen given desaturation with exercise less than 90  LOS (Days) 7 A FACE TO FACE EVALUATION WAS PERFORMED  Erick Colace 06/06/2015 7:58 AM

## 2015-06-06 NOTE — Progress Notes (Signed)
MBSS complete. Full report located under chart review in imaging section.  

## 2015-06-07 ENCOUNTER — Inpatient Hospital Stay (HOSPITAL_COMMUNITY): Payer: BLUE CROSS/BLUE SHIELD | Admitting: Occupational Therapy

## 2015-06-07 NOTE — Progress Notes (Signed)
Julia Massey is a 65 y.o. female 03/29/1951 [id number]  Subjective: No new complaints. No new problems. Slept well. Feeling better.  Objective: Vital signs in last 24 hours: Temp:  [98.1 F (36.7 C)-98.8 F (37.1 C)] 98.1 F (36.7 C) (01/28 0506) Pulse Rate:  [85-93] 85 (01/28 0506) Resp:  [18] 18 (01/28 0506) BP: (103-111)/(56) 103/56 mmHg (01/28 0506) SpO2:  [99 %-100 %] 99 % (01/28 0506) Weight:  [108 lb 14.5 oz (49.4 kg)] 108 lb 14.5 oz (49.4 kg) (01/28 0506) Weight change: 7 lb 0.9 oz (3.2 kg) Last BM Date: 06/06/15  Intake/Output from previous day: 01/27 0701 - 01/28 0700 In: 720 [P.O.:720] Out: -  Last cbgs: CBG (last 3)  No results for input(s): GLUCAP in the last 72 hours.   Physical Exam General: No apparent distress. Eating breakfast HEENT: not dry Lungs: Normal effort. Lungs clear to auscultation, no crackles or wheezes. Cardiovascular: Regular rate and rhythm, no edema Abdomen: S/NT/ND; BS(+) Musculoskeletal:  unchanged Neurological: No new neurological deficits Wounds: N/A    Skin: clear  Aging changes Mental state: Alert, oriented, cooperative    Lab Results: BMET    Component Value Date/Time   NA 137 06/05/2015 1105   K 4.1 06/05/2015 1105   CL 100* 06/05/2015 1105   CO2 30 06/05/2015 1105   GLUCOSE 91 06/05/2015 1105   BUN 10 06/05/2015 1105   CREATININE 0.52 06/05/2015 1105   CALCIUM 9.7 06/05/2015 1105   GFRNONAA >60 06/05/2015 1105   GFRAA >60 06/05/2015 1105   CBC    Component Value Date/Time   WBC 11.2* 06/02/2015 0800   RBC 4.60 06/02/2015 0800   HGB 13.6 06/02/2015 0800   HCT 42.4 06/02/2015 0800   PLT 257 06/02/2015 0800   MCV 92.2 06/02/2015 0800   MCH 29.6 06/02/2015 0800   MCHC 32.1 06/02/2015 0800   RDW 13.9 06/02/2015 0800   LYMPHSABS 2.7 05/30/2015 2100   MONOABS 0.8 05/30/2015 2100   EOSABS 0.7 05/30/2015 2100   BASOSABS 0.1 05/30/2015 2100    Studies/Results: Dg Swallowing Func-speech  Pathology  06/06/2015  Objective Swallowing Evaluation: Type of Study: MBS-Modified Barium Swallow Study Patient Details Name: Julia Massey MRN: [id number] Date of Birth: 1950-11-26 Today's Date: 06/06/2015 Time: SLP Start Time : 0905-SLP Stop Time: 0930 SLP Time Calculation (min): 25 min Past Medical History: No past medical history on file. Past Surgical History: No past surgical history on file. HPI: Pt admitted with acute hypoxic respiratory failure and concern for atypical pna. Intubated on 1/5 to 1/15. MBS done prior to CIR admission recommending Dys 2 nectar thick liquids with use of chin tuck.  Repeat MBS ordered today to objectively determine readiness for advancement.   Assessment / Plan / Recommendation CHL IP CLINICAL IMPRESSIONS 06/06/2015 Therapy Diagnosis Mild pharyngeal phase dysphagia Clinical Impression Pt presents with improvements in pharyngeal swallowing function in comparison to previous evaluation.  Pt presents with decreased base of tongue strength which results in premature spillage of materials into the pharynx with swallow response triggered at the vallecula.  Pharyngeal phase deficits were characterized by decreased anterior laryngeal movement and impaired epiglottic inversion resulting in decreased laryngeal closure during the swallow.  Penetration was observed in <50% of opportunities with thin liquids via straw which cleared with reflexive throat clear and extra swallows.  Pharyngeal weakness also resulted in mild vallecular residue which also cleared with extra swallows.  Recommend diet advancement to regular textures and thin liquids with intermittent supervision for use of  swallowing precautions (no straws, small bites/sips, slow rate).  SLP will continue to follow up to assess diet toleration.   Impact on safety and function Mild aspiration risk     Prognosis 06/06/2015 Prognosis for Safe Diet Advancement Good Barriers to Reach Goals -- Barriers/Prognosis Comment -- CHL IP DIET  RECOMMENDATION 06/06/2015 SLP Diet Recommendations Regular solids;Thin liquid Liquid Administration via Cup;No straw Medication Administration Crushed with puree Compensations Slow rate;Small sips/bites Postural Changes --            CHL IP ORAL PHASE 06/06/2015 Oral Phase WFL Oral - Pudding Teaspoon -- Oral - Pudding Cup -- Oral - Honey Teaspoon -- Oral - Honey Cup -- Oral - Nectar Teaspoon -- Oral - Nectar Cup -- Oral - Nectar Straw -- Oral - Thin Teaspoon -- Oral - Thin Cup -- Oral - Thin Straw -- Oral - Puree -- Oral - Mech Soft -- Oral - Regular -- Oral - Multi-Consistency -- Oral - Pill -- Oral Phase - Comment --  CHL IP PHARYNGEAL PHASE 06/06/2015 Pharyngeal Phase Impaired Pharyngeal- Pudding Teaspoon -- Pharyngeal -- Pharyngeal- Pudding Cup -- Pharyngeal -- Pharyngeal- Honey Teaspoon -- Pharyngeal -- Pharyngeal- Honey Cup -- Pharyngeal -- Pharyngeal- Nectar Teaspoon -- Pharyngeal -- Pharyngeal- Nectar Cup Delayed swallow initiation-vallecula;Reduced anterior laryngeal mobility;Reduced epiglottic inversion;Reduced airway/laryngeal closure;Reduced tongue base retraction;Pharyngeal residue - valleculae Pharyngeal -- Pharyngeal- Nectar Straw -- Pharyngeal -- Pharyngeal- Thin Teaspoon -- Pharyngeal -- Pharyngeal- Thin Cup Delayed swallow initiation-vallecula;Reduced epiglottic inversion;Reduced anterior laryngeal mobility;Reduced airway/laryngeal closure;Reduced tongue base retraction;Pharyngeal residue - valleculae Pharyngeal -- Pharyngeal- Thin Straw Delayed swallow initiation-vallecula;Reduced epiglottic inversion;Reduced anterior laryngeal mobility;Reduced airway/laryngeal closure;Reduced tongue base retraction;Pharyngeal residue - valleculae;Penetration/Aspiration during swallow Pharyngeal Material enters airway, remains ABOVE vocal cords and not ejected out Pharyngeal- Puree Delayed swallow initiation-vallecula;Reduced epiglottic inversion;Reduced anterior laryngeal mobility;Reduced tongue base  retraction;Pharyngeal residue - valleculae Pharyngeal -- Pharyngeal- Mechanical Soft -- Pharyngeal -- Pharyngeal- Regular Delayed swallow initiation-vallecula;Reduced epiglottic inversion;Reduced anterior laryngeal mobility;Reduced tongue base retraction;Pharyngeal residue - valleculae Pharyngeal -- Pharyngeal- Multi-consistency -- Pharyngeal -- Pharyngeal- Pill -- Pharyngeal -- Pharyngeal Comment --  CHL IP CERVICAL ESOPHAGEAL PHASE 05/28/2015 Cervical Esophageal Phase Impaired Pudding Teaspoon -- Pudding Cup -- Honey Teaspoon -- Honey Cup -- Nectar Teaspoon -- Nectar Cup -- Nectar Straw -- Thin Teaspoon -- Thin Cup -- Thin Straw -- Puree -- Mechanical Soft -- Regular -- Multi-consistency -- Pill -- Cervical Esophageal Comment -- No flowsheet data found. Page, Melanee Spry 06/06/2015, 11:47 AM               Medications: I have reviewed the patient's current medications.  Assessment/Plan:  1. Debilitation with encephalopathy secondary to acute respiratory failure, orientation improving  2. DVT Prophylaxis/Anticoagulation: Subcutaneous heparin indicated at present 3. Pain Management/musculoskeletal chest and back pain: Tylenol as needed. Hydrocodone when necessary more severe pain, patient states this is helpful, would not DC home with this given respiratory issues 4. Mood: Continue Prozac 40 mg daily -xanax prn added 5. Neuropsych: This patient is capable of making decisions on her own behalf. 6. Skin/Wound Care: Routine skin checks 7. Fluids/Electrolytes/Nutrition: Routine I's and O's. 25-30% meals, protein supp, fluid , recheck BMET 8. Tobacco abuse Provide counseling.  9. Dysphagia. Dysphagia #3 nectar thick liquids.repeat modified barium swallow today -encourage fluids 10. Chest wall pain/respiratory/COPD: musculoskeletal. Ice applied, analgesics, monitor for now.patient is compliant with incentive spirometry -she is using IS, respiratory  muscle strengthening with speech therapy -reviewed breathing techniques and discussed anxiety impact also -oxygen as needed, anticipate need for home  oxygen given desaturation with exercise less than 90    Length of stay, days: 8  Sonda Primes , MD 06/07/2015, 12:06 PM

## 2015-06-08 NOTE — Progress Notes (Signed)
KENNIYA WESTRICH is a 65 y.o. female June 18, 1950 [id number]  Subjective: No new complaints. Slept well. Feeling better.  Objective: Vital signs in last 24 hours: Temp:  [98.2 F (36.8 C)] 98.2 F (36.8 C) (01/29 0614) Pulse Rate:  [84-89] 84 (01/29 0614) Resp:  [18-20] 20 (01/29 0614) BP: (98-103)/(63-73) 103/63 mmHg (01/29 0614) SpO2:  [99 %-100 %] 99 % (01/29 2122) Weight change:  Last BM Date: 06/07/15  Intake/Output from previous day: 01/28 0701 - 01/29 0700 In: 960 [P.O.:960] Out: -  Last cbgs: CBG (last 3)  No results for input(s): GLUCAP in the last 72 hours.   Physical Exam General: No apparent distress. Finished eating breakfast.  HEENT: not dry Lungs: Normal effort. Lungs clear to auscultation, no crackles or wheezes. Cardiovascular: Regular rate and rhythm, no edema Abdomen: S/NT/ND; BS(+) Musculoskeletal:  unchanged Neurological: No new neurological deficits Wounds: N/A    Skin: clear  Aging changes Mental state: Alert, oriented, cooperative    Lab Results: BMET    Component Value Date/Time   NA 137 06/05/2015 1105   K 4.1 06/05/2015 1105   CL 100* 06/05/2015 1105   CO2 30 06/05/2015 1105   GLUCOSE 91 06/05/2015 1105   BUN 10 06/05/2015 1105   CREATININE 0.52 06/05/2015 1105   CALCIUM 9.7 06/05/2015 1105   GFRNONAA >60 06/05/2015 1105   GFRAA >60 06/05/2015 1105   CBC    Component Value Date/Time   WBC 11.2* 06/02/2015 0800   RBC 4.60 06/02/2015 0800   HGB 13.6 06/02/2015 0800   HCT 42.4 06/02/2015 0800   PLT 257 06/02/2015 0800   MCV 92.2 06/02/2015 0800   MCH 29.6 06/02/2015 0800   MCHC 32.1 06/02/2015 0800   RDW 13.9 06/02/2015 0800   LYMPHSABS 2.7 05/30/2015 2100   MONOABS 0.8 05/30/2015 2100   EOSABS 0.7 05/30/2015 2100   BASOSABS 0.1 05/30/2015 2100    Studies/Results: No results found.  Medications: I have reviewed the patient's current medications.  Assessment/Plan:  1. Debilitation with encephalopathy secondary to  acute respiratory failure, orientation improving  2. DVT Prophylaxis/Anticoagulation: Subcutaneous heparin indicated at present 3. Pain Management/musculoskeletal chest and back pain: Tylenol as needed. Hydrocodone when necessary more severe pain, patient states this is helpful, would not DC home with this given respiratory issues 4. Mood: Continue Prozac 40 mg daily -xanax prn added 5. Neuropsych: This patient is capable of making decisions on her own behalf. 6. Skin/Wound Care: Routine skin checks 7. Fluids/Electrolytes/Nutrition: Routine I's and O's. 25-30% meals, protein supp, fluid , recheck BMET 8. Tobacco abuse Provide counseling.  9. Dysphagia. Dysphagia #3 nectar thick liquids.repeat modified barium swallow today -encourage fluids 10. Chest wall pain/respiratory/COPD: musculoskeletal. Ice applied, analgesics, monitor for now.patient is compliant with incentive spirometry -she is using IS, respiratory muscle strengthening with speech therapy -reviewed breathing techniques and discussed anxiety impact also -oxygen as needed, anticipate need for home oxygen given desaturation with exercise less than 90    Length of stay, days: 9  Sonda Primes , MD 06/08/2015, 11:28 AM

## 2015-06-08 NOTE — Progress Notes (Signed)
O2 1 L/M via [place]. Complained of nose"dry and stopped up". Added humidification to O2. PRN xanax given at 2116 for reports of anxiety R/T issues at home. Alfredo Martinez A

## 2015-06-09 ENCOUNTER — Inpatient Hospital Stay (HOSPITAL_COMMUNITY): Payer: BLUE CROSS/BLUE SHIELD | Admitting: Speech Pathology

## 2015-06-09 ENCOUNTER — Inpatient Hospital Stay (HOSPITAL_COMMUNITY): Payer: BLUE CROSS/BLUE SHIELD | Admitting: Occupational Therapy

## 2015-06-09 ENCOUNTER — Inpatient Hospital Stay (HOSPITAL_COMMUNITY): Payer: BLUE CROSS/BLUE SHIELD

## 2015-06-09 NOTE — Progress Notes (Signed)
[facility]     PROGRESS NOTE    Subjective/Complaints: Pastor modified barium swallow. Eating regular diet and thin liquids.  ROS: Pt denies, nausea, vomiting, abdominal pain, diarrhea, chest pain, shortness of breath, palpitations, dysuria,. Mild anxiety   Objective: Vital Signs: Blood pressure 100/68, pulse 82, temperature 98.2 F (36.8 C), temperature source Oral, resp. rate 18, height 5' (1.524 m), weight 47.4 kg (104 lb 8 oz), SpO2 98 %. No results found. No results for input(s): WBC, HGB, HCT, PLT in the last 72 hours. No results for input(s): NA, K, CL, GLUCOSE, BUN, CREATININE, CALCIUM in the last 72 hours.  Invalid input(s): CO CBG (last 3)  No results for input(s): GLUCAP in the last 72 hours.  Wt Readings from Last 3 Encounters:  06/09/15 47.4 kg (104 lb 8 oz)  05/29/15 47.5 kg (104 lb 11.5 oz)    Physical Exam:  Constitutional:  Frail appearing. No distress  HENT:  Head: Normocephalic and atraumatic.  Eyes: Conjunctivae and EOM are normal. Pupils are equal, round, and reactive to light.  Neck: Normal range of motion. Neck supple. No tracheal deviation present. No thyromegaly present.  Cardiovascular: Regular rhythm. Reg rate  Respiratory: She has no wheezes. She has no rales. No distress. Chest less tender. Good air movement GI: Soft. She exhibits no distension.  Neurological: She is alert. No cranial nerve deficit. Coordination normal.   Oriented to month and year, place.  No sensory deficits. MMT remains: UE 3/5 delt,bicep,wrist, tricep, 4/5 HI. LE: 3/5 HF, KE and 4/5 ADF/APF.  Skin: Skin is warm and dry.  Psychiatric:  Pleasant and cooperative.   Assessment/Plan: 1. Debilitation/ encephalopathy secondary to acute respiratory failure and multiple medical which require 3+ hours per day of interdisciplinary therapy in a comprehensive inpatient rehab setting. Physiatrist is providing close team supervision and 24  hour management of active medical problems listed below. Physiatrist and rehab team continue to assess barriers to discharge/monitor patient progress toward functional and medical goals.  Function:  Bathing Bathing position   Position: Shower  Bathing parts Body parts bathed by patient: Right arm, Left arm, Chest, Abdomen, Front perineal area, Right upper leg, Buttocks, Left lower leg, Right lower leg, Left upper leg Body parts bathed by helper: Back  Bathing assist Assist Level: Set up   Set up : To obtain items  Upper Body Dressing/Undressing Upper body dressing   What is the patient wearing?: Bra, Pull over shirt/dress Bra - Perfomed by patient: Thread/unthread right bra strap, Thread/unthread left bra strap, Hook/unhook bra (pull down sports bra)   Pull over shirt/dress - Perfomed by patient: Thread/unthread right sleeve, Thread/unthread left sleeve, Put head through opening, Pull shirt over trunk          Upper body assist Assist Level: Supervision or verbal cues (S to gather clothing)      Lower Body Dressing/Undressing Lower body dressing   What is the patient wearing?: [employer], Underwear, Pants, Non-skid slipper socks Underwear - Performed by patient: Thread/unthread right underwear leg, Thread/unthread left underwear leg, Pull underwear up/down   Pants- Performed by patient: Thread/unthread right pants leg, Thread/unthread left pants leg, Pull pants up/down   Non-skid slipper socks- Performed by patient: Don/doff right sock, Don/doff left sock Non-skid slipper socks- Performed by helper: Don/doff right sock, Don/doff left sock Socks - Performed by patient: Don/doff right sock, Don/doff left sock     Shoes - Performed by helper: Don/doff right shoe, Don/doff left shoe, Fasten right,  Fasten left     TED Hose - Performed by patient: Don/doff right TED hose, Don/doff left TED hose    Lower body assist Assist for lower body dressing: Supervision or verbal cues       Toileting Toileting   Toileting steps completed by patient: Adjust clothing prior to toileting, Performs perineal hygiene, Adjust clothing after toileting   Toileting Assistive Devices: Grab bar or rail  Toileting assist Assist level: Supervision or verbal cues   Transfers Chair/bed transfer   Chair/bed transfer method: Stand pivot Chair/bed transfer assist level: Touching or steadying assistance (Pt > 75%) Chair/bed transfer assistive device: Armrests     Locomotion Ambulation     Max distance: 50 Assist level: Supervision or verbal cues   Wheelchair   Type: Manual Max wheelchair distance: 50 Assist Level: Supervision or verbal cues  Cognition Comprehension Comprehension assist level: Follows complex conversation/direction with no assist  Expression Expression assist level: Expresses complex ideas: With no assist  Social Interaction Social Interaction assist level: Interacts appropriately with others with medication or extra time (anti-anxiety, antidepressant).  Problem Solving Problem solving assist level: Solves basic problems with no assist  Memory Memory assist level: Recognizes or recalls 75 - 89% of the time/requires cueing 10 - 24% of the time   Medical Problem List and Plan:  1. Debilitation with encephalopathy secondary to acute respiratory failure, orientation improving  - 2. DVT Prophylaxis/Anticoagulation: Subcutaneous heparin indicated at present 3. Pain Management/musculoskeletal chest and back pain: Tylenol as needed.  Hydrocodone when necessary more severe pain, patient states this is helpful, would not DC home with this given respiratory issues 4. Mood: Continue Prozac 40 mg daily  -xanax prn added, would not go home with this 5. Neuropsych: This patient is capable of making decisions on her own behalf. 6. Skin/Wound Care: Routine skin checks 7. Fluids/Electrolytes/Nutrition: Routine I's and O's. 25-30% meals, protein supp, fluid , recheck BMET 8.  Tobacco abuse  Provide counseling.   9. Dysphagia. resolved  -encourage fluids   10. Chest wall pain/respiratory/COPD:  musculoskeletal. Ice applied, analgesics, monitor for now.patient is compliant with incentive spirometry  -she is using IS, respiratory muscle strengthening with speech therapy  -reviewed breathing techniques and discussed anxiety impact also  -oxygen as needed, anticipate need for home oxygen given desaturation with exercise less than 90  LOS (Days) 10 A FACE TO FACE EVALUATION WAS PERFORMED  Erick Colace 06/09/2015 8:36 AM

## 2015-06-09 NOTE — Progress Notes (Signed)
Physical Therapy Session Note  Patient Details  Name: Julia Massey MRN: [id number] Date of Birth: [birth year]  Today's Date: 06/09/2015 PT Individual Time: 1500-1600 PT Individual Time Calculation (min): 60 min   Short Term Goals: Week 2 STGs= LTGs due to LOS Skilled Therapeutic Interventions/Progress Updates:  Pt stated she has been off O2 in room.  With exertion of gait, O2 dropped to 90%; parameters 92% or greater, so 1L started via [place].  Gait on level tile and carpet, without AD, without LOB or difficulties, but pt stated LEs were "wobbly" and felt weak.  Up/down flight of 10 6" high steps, 1 rail, without O2, min guard assist, resting in a chair at top.  O2 sats 90% withotu O2; after 1 minute O2 at 1 L, O2 sat 96%. Sequencing self selected step- to and step- through ascending, step- to descending. Step/taps L and R onto 6" high stool, x 4 cycles without LOB, VS WNLS on 1 L. O2 removed for therapeutic activitiy of retrieving 8 items from floor, requiring seated rest break, without LOB. O2 sat > 92%, but tachycardic.  HR drops with seated rest break. Sidestepping L><R x 10' each direction while on 1L O2.  Gait without AD to return to room, room air. 93% O2 sat , 123 HRafter gait x 150' without rest. After 1 minute rest, O2 sat 96% and HR 105.  Pt left resting in w/c, without O2 on. RN informed.  All needs within reach.  Discussed falls risk with pt and recommended she continue to ask for help to [facility] for now.    Therapy Documentation Precautions:  Precautions Precautions: Fall Restrictions Weight Bearing Restrictions: No   Vital Signs: Therapy Vitals Temp: 97.9 F (36.6 C) Temp Source: Oral Pulse Rate: (!) 123 Resp: 18 BP: 100/61 mmHg Patient Position (if appropriate): Sitting Oxygen Therapy SpO2: 93 % O2 Device: Not Delivered O2 Flow Rate (L/min): 1 L/min Pain: Pain Assessment Pain Assessment: 0-10 Pain Score: 0-No pain Pain Intervention(s): Medication (See eMAR)    See  Function Navigator for Current Functional Status.   Therapy/Group: Individual Therapy  Claudia Alvizo 06/09/2015, 4:19 PM

## 2015-06-09 NOTE — Progress Notes (Signed)
Occupational Therapy Weekly Progress Note  Patient Details  Name: Julia Massey MRN: [id number] Date of Birth: Jun 03, 1950  Beginning of progress report period: May 31, 2015 End of progress report period: June 09, 2015  Today's Date: 06/09/2015 OT Individual Time: 7944-4619 OT Individual Time Calculation (min): 75 min    Patient has met 5 of 5 short term goals.  Pt has made excellent progress with her cognition of awareness and memory. She has improved a great deal with activity tolerance to be able to use only 1L of O2 during ambulation or more challenging activity. She is able to tolerate short time periods without O2 to shower and dress. Her dynamic balance has also improved to allow her to complete BADLs with S.  Patient continues to demonstrate the following deficits: decreased activity tolerance and balance to complete ADLs at a mod I level and therefore will continue to benefit from skilled OT intervention to enhance overall performance with BADL and iADL.  Patient progressing toward long term goals. Continue plan of care.  OT Short Term Goals Week 1:  OT Short Term Goal 1 (Week 1): Pt. will be SBA with standing balance in shower OT Short Term Goal 1 - Progress (Week 1): Met OT Short Term Goal 2 (Week 1): Pt will bathe with SBA OT Short Term Goal 2 - Progress (Week 1): Met OT Short Term Goal 3 (Week 1): Pt. will dress with SBA OT Short Term Goal 3 - Progress (Week 1): Met OT Short Term Goal 4 (Week 1): Pt. will demonstrate SBa transfers to toilet OT Short Term Goal 4 - Progress (Week 1): Met OT Short Term Goal 5 (Week 1): Pt. will engaged in shower with SBA OT Short Term Goal 5 - Progress (Week 1): Met Week 2:  OT Short Term Goal 1 (Week 2): STGs = LTGs of mod I  Skilled Therapeutic Interventions/Progress Updates:    Pt seen for skilled OT to facilitate functional mobility, balance, activity tolerance during ADL retraining. Pt received sitting EOB. Pt ambulated around  room to gather all supplies, walked into bathroom, toileted, then showered all with S. Pt without O2 for 20 min during this activity and O2 sats at 92% . Pt ambulated to bed to dress. Pt continued without O2 at 94% sat rate.  Pt sat EOB to put in contacts and complete grooming. Stood at sink to brush teeth, etc. O2 checked again at 97%.  Pt's HR elevated to 112 and she felt she needed to don O2.  Pt's daughter present for 2nd half of the session and education with dtr on pt's progress and LTGs.  Pt in room with all needs met.  Therapy Documentation Precautions:  Precautions Precautions: Fall Restrictions Weight Bearing Restrictions: No   Vital Signs: Oxygen Therapy SpO2: 94 % O2 Device: Not Delivered Pulse Oximetry Type: Intermittent Pain: Pain Assessment Pain Assessment: No/denies pain ADL:  See Function Navigator for Current Functional Status.   Therapy/Group: Individual Therapy  Caillou Minus 06/09/2015, 9:19 AM

## 2015-06-09 NOTE — Progress Notes (Signed)
Speech Language Pathology Discharge Summary  Patient Details  Name: Julia Massey MRN: [id number] Date of Birth: 1950/10/20  Today's Date: 06/09/2015 SLP Individual Time: 1000-1100 SLP Individual Time Calculation (min): 60 min   Skilled Therapeutic Interventions:  Pt was seen for skilled ST targeting dysphagia goals.  Pt reported good toleration of recently upgraded diet over the weekend and demonstrated no overt s/s of aspiration with thin liquids or solids at bedside.  Pt has been utilizing swallowing precautions with mod I over the last 2 consecutive therapy sessions.  She was also able to return demonstration of targeted pharyngeal strengthening exercises with mod I for 25 repetitions each with 100% accuracy.  SLP also facilitated the session with trials of thin liquids via straw to further liberalize pt's diet.   Pt demonstrated no overt s/s of aspiration with straw trials; therefore recommend discharging straw precautions.  Pt continues to endorse that she is at baseline for cognition.  Therefore, no further ST needs are indicated at this time. All questions were answered to pt's satisfaction at this time.      Patient has met 7 of 7 long term goals.  Patient to discharge at overall Supervision;Modified Independent level.  Reasons goals not met: n/a   Clinical Impression/Discharge Summary:  Pt made functional gains while inpatient and is discharging having met 7 out of 7 long term goals.  Pt is utilizing swallowing precautions to consume regular textures and thin liquids with mod I and minimal overt s/s of aspiration.  Pt is at baseline for cognition and will have supervision from family when discharging home.  Pt education is complete at this time.  As a result, no further ST needs are indicated.    Care Partner:  Caregiver Able to Provide Assistance: Yes  Type of Caregiver Assistance: Physical;Cognitive  Recommendation:  None      Equipment: none recommended by SLP    Reasons  for discharge: Treatment goals met   Patient/Family Agrees with Progress Made and Goals Achieved: Yes   Function:  Eating Eating   Modified Consistency Diet: No Eating Assist Level: Swallowing techniques: self managed           Cognition Comprehension Comprehension assist level: Follows complex conversation/direction with no assist  Expression   Expression assist level: Expresses complex ideas: With no assist  Social Interaction Social Interaction assist level: Interacts appropriately with others with medication or extra time (anti-anxiety, antidepressant).  Problem Solving Problem solving assist level: Solves basic problems with no assist  Memory Memory assist level: Recognizes or recalls 90% of the time/requires cueing < 10% of the time   Emilio Math 06/09/2015, 12:22 PM

## 2015-06-10 ENCOUNTER — Inpatient Hospital Stay (HOSPITAL_COMMUNITY): Payer: BLUE CROSS/BLUE SHIELD | Admitting: Physical Therapy

## 2015-06-10 ENCOUNTER — Inpatient Hospital Stay (HOSPITAL_COMMUNITY): Payer: BLUE CROSS/BLUE SHIELD

## 2015-06-10 ENCOUNTER — Inpatient Hospital Stay (HOSPITAL_COMMUNITY): Payer: BLUE CROSS/BLUE SHIELD | Admitting: Occupational Therapy

## 2015-06-10 NOTE — Progress Notes (Signed)
Occupational Therapy Session Note  Patient Details  Name: Julia Massey MRN: [id number] Date of Birth: 07/17/1950  Today's Date: 06/10/2015 OT Individual Time: 0830-0930 OT Individual Time Calculation (min): 60 min    Short Term Goals: Week 1:  OT Short Term Goal 1 (Week 1): Pt. will be SBA with standing balance in shower OT Short Term Goal 1 - Progress (Week 1): Met OT Short Term Goal 2 (Week 1): Pt will bathe with SBA OT Short Term Goal 2 - Progress (Week 1): Met OT Short Term Goal 3 (Week 1): Pt. will dress with SBA OT Short Term Goal 3 - Progress (Week 1): Met OT Short Term Goal 4 (Week 1): Pt. will demonstrate SBa transfers to toilet OT Short Term Goal 4 - Progress (Week 1): Met OT Short Term Goal 5 (Week 1): Pt. will engaged in shower with SBA OT Short Term Goal 5 - Progress (Week 1): Met Week 2:  OT Short Term Goal 1 (Week 2): STGs = LTGs of mod I  Skilled Therapeutic Interventions/Progress Updates:    Pt seen for skilled OT to facilitate functional mobility and activity tolerance with ADL retraining. Pt received sitting EOB with O2 on. Physician stated she should try to go without as much as possible as long as sats above 90%.  Pt ambulated to toilet and then shower. After completing shower, pt's O2 levels at 94%. Pt gathered all clothing and then sat to EOB to dress. Discussed home set up and pt's desire to sit down in tub occasionally. She has a tub with a shower seat in it. Recommended pt work on those transfers the next session.  Pt demonstrated good activity tolerance.  Pt's O2 sats 97% on room air. Pt sitting on EOB at end of session with all needs met.  Therapy Documentation Precautions:  Precautions Precautions: Fall Restrictions Weight Bearing Restrictions: No    Vital Signs: Therapy Vitals Temp: 98.8 F (37.1 C) Temp Source: Oral Pulse Rate: 88 Resp: 20 BP: 104/65 mmHg Patient Position (if appropriate): Lying Oxygen Therapy SpO2: 94 % O2 Device: Not  Delivered Pain: Pain Assessment Pain Assessment: No/denies pain ADL:  See Function Navigator for Current Functional Status.   Therapy/Group: Individual Therapy  , 06/10/2015, 9:14 AM  

## 2015-06-10 NOTE — Progress Notes (Signed)
Social Work Patient ID: Julia Massey, female   DOB: 1950/11/13, 65 y.o.   MRN: [id number] Team feels pt is reaching her goals sooner than Friday would like discharge Thursday [employer] also agrees and wants her to go sooner. Checked with MD and PA they are agreeable to this plan. Pt is pleased with and feels she has turned a corner. She plans to go without O2 tonight and have RN check her to make sure. No equipment needs. Pt has bought a pulse ox to check herself at Home. Check regarding follow up therapies. Work toward discharge Thursday.

## 2015-06-10 NOTE — Progress Notes (Signed)
Occupational Therapy Session Note  Patient Details  Name: Julia Massey MRN: [id number] Date of Birth: 1950/06/14  Today's Date: 06/10/2015 OT Individual Time: 1300-1357 OT Individual Time Calculation (min): 57 min   Short Term Goals: Week 2:  OT Short Term Goal 1 (Week 2): STGs = LTGs of mod I  Skilled Therapeutic Interventions/Progress Updates: Therapeutic activity with focus on performance of iADL to include ability to complete laundry task, simple meal prep (fried egg), soaking bath simulation, and pt education on  resources requested for mindfulness meditation.   Pt received seated in w/c, finishing her lunch.   Pt requested to toilet and groom at sink before planned activity.   Pt completed toileting unassisted and groomed standing at sink to brush her teeth.   Pt then ambulated carrying laundry bag (hospital towels only) from her room to pt laundry, loading laundry with only min vc for sequence d/t being unfamiliar with machine.   Pt then ambulated to ADL apartment and rested briefly seated as OT advised on planned task, indicating location of items in kitchen.   Pt proceeded to task, frying one egg while standing with no evidence of LOB or SOB.   From kitchen, pt proceeded to bathroom where pt/OT collaborated on method of entry to her garden tub at home.   OT advised use of suction cup grab bars, non-skid strips, and pool shoes to maintain grip/traction entering/exiting tub with supervision advised for safety.    Pt discussed additional topics to include interest in Yoga and meditation.   OT offered resources for contact with mindfulness meditation community, [facility], of [place].   Pt collected info from internet search at family room, independently with min vc for refining search.   Pt returned to her room at end of session with all needs within reach.     Therapy Documentation Precautions:  Precautions Precautions: Fall Restrictions Weight Bearing Restrictions: No  Vital  Signs: Therapy Vitals Pulse Rate: (!) 104 Resp: (!) 22 Patient Position (if appropriate): Sitting Oxygen Therapy SpO2: 97 % O2 Device: Not Delivered  Pain: Pain Assessment Pain Assessment: 0-10 Pain Score: 2  Pain Type: Acute pain Pain Location: Back Pain Orientation: Lower Pain Descriptors / Indicators: Aching Pain Frequency: Intermittent Pain Onset: On-going Pain Intervention(s): Medication (See eMAR) Multiple Pain Sites: No  See Function Navigator for Current Functional Status.   Therapy/Group: Individual Therapy  Merrin Mcvicker 06/10/2015, 2:02 PM

## 2015-06-10 NOTE — Progress Notes (Signed)
[place] PHYSICAL MEDICINE & REHABILITATION     PROGRESS NOTE    Subjective/Complaints: Using O2 for comfort able to tolerate OT without O2 maintaining sats>90  ROS: Pt denies, nausea, vomiting, abdominal pain, diarrhea, chest pain, shortness of breath, palpitations, dysuria,. Mild anxiety   Objective: Vital Signs: Blood pressure 104/65, pulse 88, temperature 98.8 F (37.1 C), temperature source Oral, resp. rate 20, height 5' (1.524 m), weight 47.1 kg (103 lb 13.4 oz), SpO2 100 %. No results found. No results for input(s): WBC, HGB, HCT, PLT in the last 72 hours. No results for input(s): NA, K, CL, GLUCOSE, BUN, CREATININE, CALCIUM in the last 72 hours.  Invalid input(s): CO CBG (last 3)  No results for input(s): GLUCAP in the last 72 hours.  Wt Readings from Last 3 Encounters:  06/10/15 47.1 kg (103 lb 13.4 oz)  05/29/15 47.5 kg (104 lb 11.5 oz)    Physical Exam:  Constitutional:  Frail appearing. No distress  HENT:  Head: Normocephalic and atraumatic.  Eyes: Conjunctivae and EOM are normal. Pupils are equal, round, and reactive to light.  Neck: Normal range of motion. Neck supple. No tracheal deviation present. No thyromegaly present.  Cardiovascular: Regular rhythm. Reg rate  Respiratory: She has no wheezes. She has no rales. No distress. Chest less tender. Good air movement GI: Soft. She exhibits no distension.  Neurological: She is alert. No cranial nerve deficit. Coordination normal.   Oriented to month and year, place.  No sensory deficits. MMT5/5 in BUE and BLE Skin: Skin is warm and dry.  Psychiatric:  Pleasant and cooperative.   Assessment/Plan: 1. Debilitation/ encephalopathy secondary to acute respiratory failure and multiple medical which require 3+ hours per day of interdisciplinary therapy in a comprehensive inpatient rehab setting. Physiatrist is providing close team supervision and 24 hour management of active medical problems listed  below. Physiatrist and rehab team continue to assess barriers to discharge/monitor patient progress toward functional and medical goals.  Function:  Bathing Bathing position   Position: Shower  Bathing parts Body parts bathed by patient: Right arm, Left arm, Chest, Abdomen, Front perineal area, Right upper leg, Buttocks, Left lower leg, Right lower leg, Left upper leg Body parts bathed by helper: Back  Bathing assist Assist Level: More than reasonable time   Set up : To obtain items  Upper Body Dressing/Undressing Upper body dressing   What is the patient wearing?: Bra, Pull over shirt/dress Bra - Perfomed by patient: Thread/unthread right bra strap, Thread/unthread left bra strap, Hook/unhook bra (pull down sports bra)   Pull over shirt/dress - Perfomed by patient: Thread/unthread right sleeve, Thread/unthread left sleeve, Put head through opening, Pull shirt over trunk          Upper body assist Assist Level: Supervision or verbal cues (S to gather clothing)      Lower Body Dressing/Undressing Lower body dressing   What is the patient wearing?: [employer], Underwear, Pants, Non-skid slipper socks Underwear - Performed by patient: Thread/unthread right underwear leg, Thread/unthread left underwear leg, Pull underwear up/down   Pants- Performed by patient: Thread/unthread right pants leg, Thread/unthread left pants leg, Pull pants up/down   Non-skid slipper socks- Performed by patient: Don/doff right sock, Don/doff left sock Non-skid slipper socks- Performed by helper: Don/doff right sock, Don/doff left sock Socks - Performed by patient: Don/doff right sock, Don/doff left sock     Shoes - Performed by helper: Don/doff right shoe, Don/doff left shoe, Fasten right, Fasten left     TED  Hose - Performed by patient: Don/doff right TED hose, Don/doff left TED hose    Lower body assist Assist for lower body dressing: Supervision or verbal cues (S to gather clothing walking around  room)      Toileting Toileting   Toileting steps completed by patient: Adjust clothing prior to toileting, Performs perineal hygiene, Adjust clothing after toileting   Toileting Assistive Devices: Grab bar or rail  Toileting assist Assist level: More than reasonable time   Transfers Chair/bed transfer   Chair/bed transfer method: Ambulatory Chair/bed transfer assist level: Supervision or verbal cues Chair/bed transfer assistive device: Armrests     Locomotion Ambulation     Max distance: 150 Assist level: Supervision or verbal cues   Wheelchair   Type: Manual Max wheelchair distance: 50 Assist Level: Supervision or verbal cues  Cognition Comprehension Comprehension assist level: Follows complex conversation/direction with no assist  Expression Expression assist level: Expresses complex ideas: With no assist  Social Interaction Social Interaction assist level: Interacts appropriately with others with medication or extra time (anti-anxiety, antidepressant).  Problem Solving Problem solving assist level: Solves basic problems with no assist  Memory Memory assist level: Recognizes or recalls 90% of the time/requires cueing < 10% of the time   Medical Problem List and Plan:  1. Debilitation with encephalopathy secondary to acute respiratory failure, orientation improving  - 2. DVT Prophylaxis/Anticoagulation: Subcutaneous heparin indicated at present 3. Pain Management/musculoskeletal chest and back pain: Tylenol as needed.  Hydrocodone to D/C 4. Mood: Continue Prozac 40 mg daily  -xanax D/Ced 5. Neuropsych: This patient is capable of making decisions on her own behalf. 6. Skin/Wound Care: Routine skin checks 7. Fluids/Electrolytes/Nutrition: Routine I's and O's. 60-100% meals, protein supp, 8. Tobacco abuse  Provide counseling.   9. Dysphagia. resolved  -encourage fluids   10.hypoxemia improving- encourage off O2  -she is using IS, respiratory muscle strengthening  with speech therapy  -reviewed breathing techniques and discussed anxiety impact also  -oxygen as needed, anticipate need for home oxygen given desaturation with exercise less than 90  LOS (Days) 11 A FACE TO FACE EVALUATION WAS PERFORMED  Julia Massey E 06/10/2015 8:47 AM

## 2015-06-10 NOTE — Progress Notes (Signed)
Nutrition Follow-up  DOCUMENTATION CODES:   Non-severe (moderate) malnutrition in context of chronic illness  INTERVENTION:  Continue Ensure Enlive po BID, each supplement provides 350 kcal and 20 grams of protein.  Provide Magic cup with meals.   Encourage adequate PO intake.   NUTRITION DIAGNOSIS:   Inadequate oral intake related to dysphagia as evidenced by  (varied meal completion 10-60%); improved  GOAL:   Patient will meet greater than or equal to 90% of their needs; met  MONITOR:   PO intake, Supplement acceptance, Diet advancement, Weight trends, Labs, I & O's  REASON FOR ASSESSMENT:   Malnutrition Screening Tool    ASSESSMENT:   65 y.o. right handed female with history of tobacco abuse ,COPD . Presented 05/15/2015 with productive cough low-grade fever and chills.  CT of the chest revealed extensive bilateral patchy infiltrates atypical pneumonia.  Pt extubated 1/16. TF discontinued. S/p objective swallow evaluation 1/18.  Meal completion has been mostly 100%. Pt currently has Ensure ordered and has been consuming most of them. RD to continue with current orders. Plans for pt to be discharged Thursday as pt has been reaching her goals sooner.   Labs and medications reviewed.   Diet Order:  Diet regular Room service appropriate?: Yes; Fluid consistency:: Thin  Skin:  Reviewed, no issues  Last BM:  1/29  Height:   Ht Readings from Last 1 Encounters:  05/30/15 5' (1.524 m)    Weight:   Wt Readings from Last 1 Encounters:  06/10/15 103 lb 13.4 oz (47.1 kg)    Ideal Body Weight:  45.5 kg  BMI:  Body mass index is 20.28 kg/(m^2).  Estimated Nutritional Needs:   Kcal:  1400-1600  Protein:  75-85 grams  Fluid:  >/=1.5 L/day  EDUCATION NEEDS:   No education needs identified at this time  Corrin Parker, MS, RD, LDN Pager # [phone] After hours/ weekend pager # [phone]

## 2015-06-10 NOTE — Progress Notes (Addendum)
Physical Therapy Note  Patient Details  Name: ADAIAH JASKOT MRN: [id number] Date of Birth: 03/13/1951 Today's Date: 06/10/2015    Time: 1045-1150 65 minutes  1:1 No c/o pain. Pt gait with close supervision throughout unit with no LOB. Pt occasional holds to wall when fatigued. Pt educated on safety and not "furniture walking", pt able to demo understanding through rest of session.  spO2 95% after gait on room air.  Stair negotiation x 12 steps in stairwell with seated rest at top of stairs. Pt used 1 railing, min A.  biodex balance training with limits of stability with pt demo'ing difficulty shifting wt to the Rt, improved with repetition.  Standing strengthening with step ups forward and laterally to 4'' step without AD for LE strength, 2 x 10 bilat.  Nu step per pt request as she hopes to join the [employer] at d/c. Pt performed level 2 with UEs and LEs 2 minutes x 3.  Stretching exercises for LE and back flexibility performed in supine. Pt given handout of exercises to perform at home. Pt states she feels good about d/c home this week.   MMT per MD request:  Hip flex 4/5 bilat Knee ext 4+/5 bilat Knee flex 4+/5 bilat Ankle PF/DF 4+/5 bilat  Lexia Vandevender 06/10/2015, 11:51 AM

## 2015-06-11 ENCOUNTER — Inpatient Hospital Stay (HOSPITAL_COMMUNITY): Payer: BLUE CROSS/BLUE SHIELD | Admitting: Occupational Therapy

## 2015-06-11 ENCOUNTER — Inpatient Hospital Stay (HOSPITAL_COMMUNITY): Payer: BLUE CROSS/BLUE SHIELD

## 2015-06-11 NOTE — Progress Notes (Signed)
Occupational Therapy Session Note  Patient Details  Name: Julia Massey MRN: [id number] Date of Birth: 1950/07/01  Today's Date: 06/11/2015 OT Individual Time: 0830-1000 OT Individual Time Calculation (min): 90 min    Short Term Goals: Week 2:  OT Short Term Goal 1 (Week 2): STGs = LTGs of mod I  Skilled Therapeutic Interventions/Progress Updates:    Pt seen for skilled OT to facilitate functional mobility and balance to enable pt to complete all self care at a mod I level. Pt discussed what she did in previous OT session yesterday and that she plans to carry out the recommendations to attend a yoga/mindfullnes center along with a [employer].  Without cuing, pt gathered all clothing and items she needed for self care ambulating around the room demonstrating good postural control and balance.  Pt transferred in shower and then completed all self care after that with mod I LB self care, I with UB self care.  Pts O2 checked 2x during session. 100% on room air. Pt ambulated to gym focusing on relaxed gait pattern and smooth breathing in through nose and out through mouth. Pt practiced floor transfers onto mat by touching fingertips down to floor and then lunging one leg back. Used same method to stand up. On mat worked on yoga postures of down dog, cat/cow, seated twist, bridges, spine twist, and childs pose. Pt ambulated back to room with all needs met. A modified independence sign placed on her door.    Therapy Documentation Precautions:  Precautions Precautions: Fall Restrictions Weight Bearing Restrictions: No    Vital Signs: Therapy Vitals Temp: 98.5 F (36.9 C) Temp Source: Oral Pulse Rate: 97 Resp: 18 BP: 109/68 mmHg Patient Position (if appropriate): Lying Oxygen Therapy SpO2: 100 % O2 Device: Not Delivered Pain: Pain Assessment Pain Assessment: No/denies pain (back pain improved from early this morning, "just stiff" per pt)  ADL: ADL ADL Comments: mod I,  supervision into shower See Function Navigator for Current Functional Status.   Therapy/Group: Individual Therapy  [place] 06/11/2015, 8:46 AM

## 2015-06-11 NOTE — Progress Notes (Signed)
Physical Therapy Discharge Summary  Patient Details  Name: Julia Massey MRN: [id number] Date of Birth: [birth year]  Today's Date: 06/11/2015 PT Individual Time: 1100-1200; 1505-1540 PT Individual Time Calculation (min): 60 min , 35 min  Patient has met 8 of 8 long term goals due to improved activity tolerance, improved balance, improved postural control, increased range of motion, ability to compensate for deficits, functional use of  right lower extremity and left lower extremity and improved awareness.  Patient to discharge at an ambulatory level Modified Independent.     Reasons goals not met: n/a  Recommendation:  Patient will benefit from ongoing skilled PT services in outpatient setting to continue to advance safe functional mobility, address ongoing impairments in activity tolerance, balance, and minimize fall risk.  Pt has chronic LBP but is eager to also improve core strength, flexibility, and reduce pain she has experienced in the past.  Equipment: No equipment provided  Reasons for discharge: treatment goals met and discharge from hospital  Patient/family agrees with progress made and goals achieved: Yes  PT Discharge  tx 1:  Floor transfer, retrieved small item from floor, simulated car transfer, gait over simulated uneven terrain, self stretching bil heel cords and hamstrings via standing runner's stretch.  Balance retraining standing on wedge, x 1 minute, x 2.  Reviewed safety for fall recovery.  Hip abductor strengthening via sidestepping; [place] exs standing: hamstring curls, mini squats, calf raises,  Alternating toe raises, because pt's tight heel cords inhibit toe raises and ankle strategy when external perturbations provided. Balance retraining via external perturbations. On room air, O2 sats drop to mid 80s during exertion such as floor transfer and standing runner's stretch. Pt is aware of need to rest and perform pursed lip breathing and relaxation; O2 increases to  >92% in less than 20 seconds.  tx 2: .dgi score 19/24.  Up/down 14 steps L rail with seated rest after 10 steps on landing. O2 sats > 90% during session.  Activity tolerance on NuStep at level 4, rated 13 on BORG x 2 minutes x 2, x 1 minute x 1. HR 120 with exertion. PT returned pt to room; she is modified independent in room now.  Pt reported she wants to join [employer] to work on Yoga for balance.  PT cautioned her to discuss with OPPT and slowly work into increased activity.  Discussed balance issues as noted by DGI score; AD is not recommended. Issued hand out of standing runner's stretch for bil heel cords and hamstrings.   Precautions/Restrictions- fall   Vital Signs Therapy Vitals Temp: 98.4 F (36.9 C) Temp Source: Oral Pulse Rate: 99 Resp: 18 BP: 114/65 mmHg Patient Position (if appropriate): Sitting Oxygen Therapy SpO2: 98 % O2 Device: Not Delivered Pain- none reported   Vision/Perception- no problems     Cognition- A and O x 4   Sensation Sensation Light Touch: Appears Intact Stereognosis: Appears Intact Hot/Cold: Appears Intact Proprioception: Appears Intact Coordination Gross Motor Movements are Fluid and Coordinated: Yes Fine Motor Movements are Fluid and Coordinated: Yes Motor  Motor Motor: Within Functional Limits  Mobility Bed Mobility Rolling Right: 7: Independent Rolling Left: 7: Independent Supine to Sit: 7: Independent Sit to Supine: 7: Independent Transfers Sit to Stand: 7: Independent Stand to Sit: 7: Independent Locomotion  Ambulation Ambulation: Yes Ambulation/Gait Assistance: 6: Modified independent (Device/Increase time) Ambulation Distance (Feet): 320 Feet Assistive device: None Gait Gait: Yes Gait Pattern: Impaired Gait Pattern: Decreased trunk rotation;Narrow base of support  Trunk/Postural Assessment  Cervical Assessment Cervical Assessment: Within Functional Limits Thoracic Assessment Thoracic Assessment: Within Functional  Limits Lumbar Assessment Lumbar Assessment: Within Functional Limits Postural Control Postural Control: Within Functional Limits  Balance Standardized Balance Assessment Standardized Balance Assessment: Dynamic Gait Index Dynamic Gait Index Level Surface: Mild Impairment Change in Gait Speed: Normal Gait with Horizontal Head Turns: Normal Gait with Vertical Head Turns: Normal Gait and Pivot Turn: Moderate Impairment Step Over Obstacle: Normal Step Around Obstacles: Normal Steps: Moderate Impairment Total Score: 19 Static Sitting Balance Static Sitting - Level of Assistance: 7: Independent Dynamic Standing Balance Dynamic Standing - Level of Assistance: 7: Independent Dynamic Standing - Balance Activities: Other (comment) Dynamic Standing - Comments: delayed ankle, hip and stepping strategies give external perturbations Extremity Assessment  RUE Assessment RUE Assessment: Within Functional Limits LUE Assessment LUE Assessment: Within Functional Limits RLE Assessment RLE Assessment: Within Functional Limits (grossly 4+/5 overall) LLE Assessment LLE Assessment: Within Functional Limits (4+/5 overall)   See Function Navigator for Current Functional Status.  Julia Massey 06/11/2015, 3:32 PM

## 2015-06-11 NOTE — Discharge Summary (Signed)
NAME:  Julia Massey, Julia Massey                   ACCOUNT NO.:  MEDICAL RECORD NO.:  [id number]  LOCATION:                                 FACILITY:  PHYSICIAN:  Erick Colace, M.D.DATE OF BIRTH:  05/21/1950  DATE OF ADMISSION:  05/28/2015 DATE OF DISCHARGE:  06/12/2015                              DISCHARGE SUMMARY   DISCHARGE DIAGNOSES: 1. Debilitation with encephalopathy secondary to acute respiratory     failure. 2. Subcutaneous heparin for DVT prophylaxis. 3. Pain management. 4. Anxiety with depression. 5. Tobacco abuse. 6. Dysphagia, resolved.  HISTORY OF PRESENT ILLNESS:  This is a 65 year old right-handed female with history of tobacco abuse.  She lives with her spouse independent prior to admission.  Presented on May 15, 2015 with productive cough, low-grade fever and chills.  Recently placed on a Z-Pak per her primary doctor, found to have hypoxia 73% on room air.  CT of the chest revealed extensive bilateral patchy infiltrates with atypical pneumonia.  No evidence of pulmonary emboli.  White blood cell count 20,800, lactic acid 2.5, potassium 3.2, placed on broad-spectrum antibiotics.  Patient required intubation.  Echocardiogram with ejection fraction of 60% and grade 1 diastolic dysfunction.  Blood cultures, no growth.  Bouts of altered mental status.  EEG negative for seizure.  Cranial CT scan negative.  Extubated on May 26, 2015 and followup per critical care. Subcutaneous heparin for DVT prophylaxis.  Swallow study completed on May 28, 2015.  Diet advanced to a dysphagia No. 2 nectar thick liquid.  Physical therapy evaluation completed.  The patient was admitted for comprehensive rehab program.  PAST MEDICAL HISTORY:  See discharge diagnoses.  SOCIAL HISTORY:  Lives with spouse, independent prior to admission. Functional status upon admission to rehab services was total assist and pivot transfers, max assist sit to stand, min to mod assist  activities of daily living.  PHYSICAL EXAMINATION:  VITAL SIGNS:  Blood pressure 113/70, pulse 92, respirations 22, temperature 98.8. GENERAL:  This was an alert female, made good eye contact with examiner, oriented to month and year, improved awareness in general. LUNGS:  Decreased breath sounds.  Clear to auscultation without wheeze. CARDIAC:  Regular rate and rhythm, no murmur. ABDOMEN:  Soft, nontender.  Good bowel sounds.  REHABILITATION HOSPITAL COURSE:  Patient was admitted to inpatient rehab services with therapies initiated on a 3-hour daily basis consisting of physical therapy, occupational therapy, speech therapy, and rehabilitation nursing.  The following issues were addressed during the patient's rehabilitation stay.  Pertaining to Ms. Spillane's debilitation related to acute respiratory failure, she continued to improve.  Oxygen saturations sustained during physical activity.  No complaints of shortness of breath.  She remained on subcutaneous heparin for DVT prophylaxis.  No bleeding episodes.  Pain management with the use of Tylenol, her hydrocodone had since been discontinued.  She did have a noted history of depression.  She remained on Prozac.  Her Xanax had been discontinued.  She was  initiating full therapies.  She did have a history of tobacco abuse.  She received full counts in regard to cessation of nicotine products.  It was questionable if she would be compliant with these  request.  Her diet was advanced to a regular consistency, which she tolerated nicely.  As patient received weekly collaborative interdisciplinary team conferences the following issues of physical therapy were addressed.  Patient occasionally fatigued, undergoing energy conservation techniques, educated on safety and furniture walking.  Stair negotiations, minimal assistance, improved repetition.  Activities of daily living and homemaking.  She can gather her belongings for activities of  daily living and homemaking, demonstrating good activity tolerance.  Full family teaching was completed and planned discharge to home.  DISCHARGE MEDICATIONS: 1. Prozac 40 mg p.o. daily. 2. Folic acid 1 mg p.o. daily. 3. Multivitamin 1 tablet daily. 4. MiraLAX 17 g daily, hold for loose stool. 5. Ocean nasal spray as needed.  DIET:  Regular.  FOLLOWUP:  The patient would follow up Dr. Claudette Laws at the outpatient rehab center as needed; Dr. Corine Shelter, medical management.  Ongoing therapies dictated per rehab services.     Mariam Dollar, P.A.   ______________________________ Erick Colace, M.D.    DA/MEDQ  D:  06/11/2015  T:  06/11/2015  Job:  [id number]  cc:   Mina Marble, M.D. Erick Colace, M.D.

## 2015-06-11 NOTE — Discharge Summary (Signed)
Discharge summary job [id number]

## 2015-06-11 NOTE — Patient Care Conference (Signed)
Inpatient RehabilitationTeam Conference and Plan of Care Update Date: 06/11/2015   Time: 10:50 AM    Patient Name: Julia Massey      Medical Record Number: [id number]  Date of Birth: 04/29/1951 Sex: Female         Room/Bed: 4W05C/4W05C-01 Payor Info: Payor: BLUE CROSS BLUE SHIELD / Plan: BCBS OTHER / Product Type: *No Product type* /    Admitting Diagnosis: debility   encephalopathy  Admit Date/Time:  05/30/2015  6:20 PM Admission Comments: No comment available   Primary Diagnosis:  Encephalopathy Principal Problem: Encephalopathy  Patient Active Problem List   Diagnosis Date Noted  . Encephalopathy 05/30/2015  . Atypical pneumonia   . Acute pulmonary edema (HCC)   . Chronic diastolic CHF (congestive heart failure) (HCC)   . Pulmonary hypertension (HCC)   . HLD (hyperlipidemia)   . Hypernatremia   . Anxiety state   . Depression   . Acute encephalopathy   . Acute respiratory failure with hypoxemia (HCC)   . Altered mental status   . COPD exacerbation (HCC)   . Encephalopathy acute   . ARDS (adult respiratory distress syndrome) (HCC)   . Encounter for orogastric (OG) tube placement   . Respiratory failure (HCC)   . Acute respiratory failure with hypoxia (HCC) 05/14/2015  . Malnutrition of moderate degree 05/14/2015    Expected Discharge Date: Expected Discharge Date: 06/12/15  Team Members Present: Physician leading conference: Dr. Claudette Laws Social Worker Present: Dossie Der, LCSW Nurse Present: Chana Bode, RN PT Present: Wanda Plump, PT OT Present: Perrin Maltese, OT SLP Present: Jackalyn Lombard, SLP PPS Coordinator present : Tora Duck, RN, CRRN     Current Status/Progress Goal Weekly Team Focus  Medical   off O2 without desats  home with Mod I  D/C planning   Bowel/Bladder   continent of bowel and bladder. LBM 06/10/15  Pt to remain continent of bowel and bladder  Monitor   Swallow/Nutrition/ Hydration   Upgraded to regular textures, thin liquids;  discharged from ST services          ADL's   supervision toilet and shower transfers, mod I bathing, toileting, and dressing  mod I except for S with tub transfers  pt/family education, ADL retraining, balance training, activity tolerance, energy conservation   Mobility   min A stairs, supervision gait and transfers without AD  modified independent  transfers and gait x 100'  wean O2, activity to[place], strength   Communication     na        Safety/Cognition/ Behavioral Observations  at baseline for mentation; no further ST needs          Pain   Occasional complaints of pain. Pt using Norco Q 6 as needed.   <3  Assess pain and treat accordingly.    Skin   Brusing to abdomen from heparin  No skin breakdown  Encourage skin breakdown prevention      *See Care Plan and progress notes for long and short-term goals.  Barriers to Discharge: none    Possible Resolutions to Barriers:  move up d/c to am    Discharge Planning/Teaching Needs:  Pt is doing well and will reach her goals sooner than Friday-target discharge Thursday, home with grown daughter for a short time. Pt is pleased with her progress here      Team Discussion:  Goals mod/i level reaching them today in therapies. Off O2 have been monitoring during the day and last night. Endurance and activity  tolerance improved. Off xanax & hydrocodone. Working on deep breathing and energy conservation. Regular diet-thin liquid  Revisions to Treatment Plan:  Moved up discharge to 2/2   Continued Need for Acute Rehabilitation Level of Care: The patient requires daily medical management by a physician with specialized training in physical medicine and rehabilitation for the following conditions: Daily direction of a multidisciplinary physical rehabilitation program to ensure safe treatment while eliciting the highest outcome that is of practical value to the patient.: Yes Daily medical management of patient stability for increased  activity during participation in an intensive rehabilitation regime.: Yes Daily analysis of laboratory values and/or radiology reports with any subsequent need for medication adjustment of medical intervention for : Pulmonary problems;Other  Jack Mineau, Lemar Livings 06/11/2015, 1:55 PM

## 2015-06-11 NOTE — Progress Notes (Addendum)
Occupational Therapy Discharge Summary  Patient Details  Name: Julia Massey MRN: [id number] Date of Birth: [birth year]   Session Note:  Worked on dynamic standing balance during session with use of the [employer] and [employer].  Worked on divided attention with multiple programs with use of [employer].  She was able to perform activity with 100% accuracy when standing on solid surface, and just concentrating on pushing the red buttons only.  Once divided attention was added in with pt using balance board and having to not push the green buttons once they came up, she had more difficulty with only 75% accuracy.  Pt only able to state 2/10 , 2 digit numbers while pushing just the red buttons.  Reaction time just over 1 second or less with all tasks.  Finished session with work on weightshifts using the [employer].  Decreased smoothness with ankle and hip strategies.  She tends to try and use her head to adjust her balance instead of the appropriate balance strategies.  Pt ambulated throughout session to and from the therapy gyms with independence.  Pt still with some SOB with activity.  Sats however maintaining 93% or greater.  Patient has met 10 of 10 long term goals due to improved activity tolerance, improved balance, improved attention and improved awareness.  Patient to discharge at overall Modified Independent level.  Patient's care partner is independent to provide the necessary physical assistance at discharge.    Reasons goals not met: n/a  Recommendation:  No further OT services required at this time.  Equipment: No equipment provided  Reasons for discharge: treatment goals met  Patient/family agrees with progress made and goals achieved: Yes  OT Discharge Vital Signs Therapy Vitals Temp: 98.5 F (36.9 C) Temp Source: Oral Pulse Rate: 97 Resp: 18 BP: 109/68 mmHg Patient Position (if appropriate): Lying Oxygen Therapy SpO2: 100 % O2 Device: Not Delivered Pain Pain  Assessment Pain Assessment: No/denies pain (back pain improved from early this morning, "just stiff" per pt) ADL ADL ADL Comments: mod I, supervision into shower Vision/Perception  Vision- History Patient Visual Report: No change from baseline Vision- Assessment Vision Assessment?: No apparent visual deficits  Cognition Overall Cognitive Status: Within Functional Limits for tasks assessed Sensation Sensation Light Touch: Appears Intact Stereognosis: Appears Intact Hot/Cold: Appears Intact Proprioception: Appears Intact Coordination Gross Motor Movements are Fluid and Coordinated: Yes Fine Motor Movements are Fluid and Coordinated: Yes Motor  Motor Motor: Within Functional Limits Mobility  Bed Mobility Rolling Right: 7: Independent Rolling Left: 7: Independent Supine to Sit: 7: Independent Sit to Supine: 7: Independent Transfers Sit to Stand: 7: Independent Stand to Sit: 7: Independent  Trunk/Postural Assessment  Cervical Assessment Cervical Assessment: Within Functional Limits Thoracic Assessment Thoracic Assessment: Within Functional Limits Lumbar Assessment Lumbar Assessment: Within Functional Limits Postural Control Postural Control: Within Functional Limits  Balance Static Sitting Balance Static Sitting - Level of Assistance: 7: Independent Dynamic Standing Balance Dynamic Standing - Level of Assistance: 7: Independent Extremity/Trunk Assessment RUE Assessment RUE Assessment: Within Functional Limits LUE Assessment LUE Assessment: Within Functional Limits   See Function Navigator for Current Functional Status.  [place] 06/11/2015, 8:45 AM

## 2015-06-11 NOTE — Progress Notes (Signed)
[facility] PHYSICAL MEDICINE & REHABILITATION     PROGRESS NOTE    Subjective/Complaints: Patient did stairs as well as ambulation and ADLs without oxygen yesterday. No desaturations. Patient denies any chest pain or shortness of breath today. Looking forward to discharge in a.m.  ROS: Pt denies, nausea, vomiting, abdominal pain, diarrhea, chest pain, shortness of breath, palpitations, dysuria,. Mild anxiety   Objective: Vital Signs: Blood pressure 109/68, pulse 97, temperature 98.5 F (36.9 C), temperature source Oral, resp. rate 18, height 5' (1.524 m), weight 45.9 kg (101 lb 3.1 oz), SpO2 97 %. No results found. No results for input(s): WBC, HGB, HCT, PLT in the last 72 hours. No results for input(s): NA, K, CL, GLUCOSE, BUN, CREATININE, CALCIUM in the last 72 hours.  Invalid input(s): CO CBG (last 3)  No results for input(s): GLUCAP in the last 72 hours.  Wt Readings from Last 3 Encounters:  06/11/15 45.9 kg (101 lb 3.1 oz)  05/29/15 47.5 kg (104 lb 11.5 oz)    Physical Exam:  Constitutional:  Frail appearing. No distress  HENT:  Head: Normocephalic and atraumatic.  Eyes: Conjunctivae and EOM are normal. Pupils are equal, round, and reactive to light.  Neck: Normal range of motion. Neck supple. No tracheal deviation present. No thyromegaly present.  Cardiovascular: Regular rhythm. Reg rate  Respiratory: She has no wheezes. She has no rales. No distress. Chest less tender. Good air movement GI: Soft. She exhibits no distension.  Neurological: She is alert. No cranial nerve deficit. Coordination normal.   Oriented to month and year, place.  No sensory deficits. MMT5/5 in BUE and BLE Standing balance is good static, fair dynamic, ambulation is with supervision L evidence of toe drag or knee instability Skin: Skin is warm and dry.  Psychiatric:  Pleasant and cooperative.   Assessment/Plan: 1. Debilitation/ encephalopathy secondary to acute respiratory failure  and multiple medical which require 3+ hours per day of interdisciplinary therapy in a comprehensive inpatient rehab setting. Physiatrist is providing close team supervision and 24 hour management of active medical problems listed below. Physiatrist and rehab team continue to assess barriers to discharge/monitor patient progress toward functional and medical goals.  Function:  Bathing Bathing position   Position: Shower  Bathing parts Body parts bathed by patient: Right arm, Left arm, Chest, Abdomen, Front perineal area, Right upper leg, Buttocks, Left lower leg, Right lower leg, Left upper leg Body parts bathed by helper: Back  Bathing assist Assist Level: More than reasonable time   Set up : To obtain items  Upper Body Dressing/Undressing Upper body dressing   What is the patient wearing?: Bra, Pull over shirt/dress Bra - Perfomed by patient: Thread/unthread right bra strap, Thread/unthread left bra strap, Hook/unhook bra (pull down sports bra)   Pull over shirt/dress - Perfomed by patient: Thread/unthread right sleeve, Thread/unthread left sleeve, Put head through opening, Pull shirt over trunk          Upper body assist Assist Level: Supervision or verbal cues (S to gather clothing)      Lower Body Dressing/Undressing Lower body dressing   What is the patient wearing?: [employer], Underwear, Pants, Non-skid slipper socks Underwear - Performed by patient: Thread/unthread right underwear leg, Thread/unthread left underwear leg, Pull underwear up/down   Pants- Performed by patient: Thread/unthread right pants leg, Thread/unthread left pants leg, Pull pants up/down   Non-skid slipper socks- Performed by patient: Don/doff right sock, Don/doff left sock Non-skid slipper socks- Performed by helper: Don/doff right sock, Don/doff  left sock Socks - Performed by patient: Don/doff right sock, Don/doff left sock   Shoes - Performed by patient: Don/doff right shoe, Don/doff left shoe, Fasten  right, Fasten left Shoes - Performed by helper: Don/doff right shoe, Don/doff left shoe, Fasten right, Fasten left     TED Hose - Performed by patient: Don/doff right TED hose, Don/doff left TED hose    Lower body assist Assist for lower body dressing: More than reasonable time      Toileting Toileting   Toileting steps completed by patient: Adjust clothing prior to toileting, Performs perineal hygiene, Adjust clothing after toileting   Toileting Assistive Devices: Grab bar or rail  Toileting assist Assist level: Supervision or verbal cues   Transfers Chair/bed transfer   Chair/bed transfer method: Ambulatory Chair/bed transfer assist level: Supervision or verbal cues Chair/bed transfer assistive device: Armrests     Locomotion Ambulation     Max distance: 150 Assist level: Supervision or verbal cues   Wheelchair   Type: Manual Max wheelchair distance: 50 Assist Level: Supervision or verbal cues  Cognition Comprehension Comprehension assist level: Follows complex conversation/direction with no assist  Expression Expression assist level: Expresses complex ideas: With no assist  Social Interaction Social Interaction assist level: Interacts appropriately with others with medication or extra time (anti-anxiety, antidepressant).  Problem Solving Problem solving assist level: Solves basic problems with no assist  Memory Memory assist level: Recognizes or recalls 90% of the time/requires cueing < 10% of the time   Medical Problem List and Plan:  1. Debilitation with encephalopathy secondary to acute respiratory failure, orientation improving-Team conference today please see physician documentation under team conference tab, met with team face-to-face to discuss problems,progress, and goals. Formulized individual treatment plan based on medical history, underlying problem and comorbidities.  -plan discharge tomorrow morning 2. DVT Prophylaxis/Anticoagulation: Subcutaneous  heparin indicated at present 3. Pain Management/musculoskeletal chest and back pain: Tylenol as needed.  4. Mood: Continue Prozac 40 mg daily  -follow-up PCP 5. Neuropsych: This patient is capable of making decisions on her own behalf. 6. Skin/Wound Care: Routine skin checks 7. Fluids/Electrolytes/Nutrition: Routine I's and O's.  8. Tobacco abuse  Provide counseling.   9. Dysphagia. resolved  -encourage fluids   10.hypoxemia improving- encourage off O2  -she is using IS, respiratory muscle strengthening with speech therapy  -reviewed breathing techniques and discussed anxiety impact also  -oxygen as needed, anticipate need for home oxygen given desaturation with exercise less than 90  LOS (Days) 12 A FACE TO FACE EVALUATION WAS PERFORMED  Charlett Blake 06/11/2015 8:39 AM

## 2015-06-11 NOTE — Progress Notes (Signed)
Social Work Patient ID: Julia Massey, female   DOB: 1951-03-12, 66 y.o.   MRN: [id number] Met with pt to discuss team conference meeting her goals for discharge tomorrow. She is very pleased with how well she has done here and not needing to go home without O2. Discussed OPPT and she was agreeable to this. Will make referral and make first appointment. Daughter to be with her at home until she is able to be alone. No equipment needs. Work toward discharge tomorrow.

## 2015-06-11 NOTE — Discharge Instructions (Signed)
Inpatient Rehab Discharge Instructions  Julia Massey Discharge date and time: No discharge date for patient encounter.   Activities/Precautions/ Functional Status: Activity: activity as tolerated Diet: regular diet Wound Care: none needed Functional status:  ___ No restrictions     ___ Walk up steps independently ___ 24/7 supervision/assistance   ___ Walk up steps with assistance ___ Intermittent supervision/assistance  ___ Bathe/dress independently ___ Walk with walker     _x__ Bathe/dress with assistance ___ Walk Independently    ___ Shower independently _x__ Walk with assistance    ___ Shower with assistance ___ No alcohol     ___ Return to work/school ________  Special Instructions:    COMMUNITY REFERRALS UPON DISCHARGE:      Outpatient: PT  Agency:[facility] Phone:[phone]   Date of Last Service:06/12/2015  Appointment Date/Time:FEBRUARY 8-Wednesday 10:30-11:45 AM  Medical Equipment/Items Ordered:NO NEEDS    My questions have been answered and I understand these instructions. I will adhere to these goals and the provided educational materials after my discharge from the hospital.  Patient/Caregiver Signature _______________________________ Date __________  Clinician Signature _______________________________________ Date __________  Please bring this form and your medication list with you to all your follow-up doctor's appointments.

## 2015-06-12 MED ORDER — POLYETHYLENE GLYCOL 3350 17 G PO PACK: 17.0000 g | PACK | Freq: Every day | ORAL | Status: AC

## 2015-06-12 MED ORDER — FOLIC ACID 1 MG PO TABS: 1.0000 mg | ORAL_TABLET | Freq: Every day | ORAL | Status: AC

## 2015-06-12 MED ORDER — FLUOXETINE HCL 40 MG PO CAPS: 40.0000 mg | ORAL_CAPSULE | Freq: Every day | ORAL | Status: AC

## 2015-06-12 NOTE — Progress Notes (Signed)
Pt. Got d/c instructions,follow up appointments and prescriptions.Pt. Ready to go home with her husband. 

## 2015-06-12 NOTE — Progress Notes (Signed)
[facility] PHYSICAL MEDICINE & REHABILITATION     PROGRESS NOTE    Subjective/Complaints: No cp or sob excited about d/c  ROS: Pt denies, nausea, vomiting, abdominal pain, diarrhea, chest pain, shortness of breath, palpitations, dysuria,. Mild anxiety   Objective: Vital Signs: Blood pressure 111/56, pulse 81, temperature 98.7 F (37.1 C), temperature source Oral, resp. rate 17, height 5' (1.524 m), weight 45.9 kg (101 lb 3.1 oz), SpO2 96 %. No results found. No results for input(s): WBC, HGB, HCT, PLT in the last 72 hours. No results for input(s): NA, K, CL, GLUCOSE, BUN, CREATININE, CALCIUM in the last 72 hours.  Invalid input(s): CO CBG (last 3)  No results for input(s): GLUCAP in the last 72 hours.  Wt Readings from Last 3 Encounters:  06/11/15 45.9 kg (101 lb 3.1 oz)  05/29/15 47.5 kg (104 lb 11.5 oz)    Physical Exam:  Constitutional:  Frail appearing. No distress  HENT:  Head: Normocephalic and atraumatic.  Eyes: Conjunctivae and EOM are normal. Pupils are equal, round, and reactive to light.  Neck: Normal range of motion. Neck supple. No tracheal deviation present. No thyromegaly present.  Cardiovascular: Regular rhythm. Reg rate  Respiratory: She has no wheezes. She has no rales. No distress. Chest less tender. Good air movement GI: Soft. She exhibits no distension.  Neurological: She is alert. No cranial nerve deficit. Coordination normal.   Oriented to month and year, place.  No sensory deficits. MMT5/5 in BUE and BLE Standing balance is good static, fair dynamic, ambulation is with supervision L evidence of toe drag or knee instability Skin: Skin is warm and dry.  Psychiatric:  Pleasant and cooperative.   Assessment/Plan: 1. Debilitation/ encephalopathy secondary to acute respiratory failure and multiple medical  Stable for D/C today F/u PCP in 1-2 weeks  See D/C summary See D/C instructions Function:  Bathing Bathing position   Position:  Shower  Bathing parts Body parts bathed by patient: Right arm, Left arm, Chest, Abdomen, Front perineal area, Right upper leg, Buttocks, Left lower leg, Right lower leg, Left upper leg Body parts bathed by helper: Back  Bathing assist Assist Level: More than reasonable time   Set up : To obtain items  Upper Body Dressing/Undressing Upper body dressing   What is the patient wearing?: Bra, Pull over shirt/dress Bra - Perfomed by patient: Thread/unthread right bra strap, Thread/unthread left bra strap, Hook/unhook bra (pull down sports bra)   Pull over shirt/dress - Perfomed by patient: Thread/unthread right sleeve, Thread/unthread left sleeve, Put head through opening, Pull shirt over trunk          Upper body assist Assist Level: No help, No cues      Lower Body Dressing/Undressing Lower body dressing   What is the patient wearing?: [employer], Underwear, Pants, Non-skid slipper socks Underwear - Performed by patient: Thread/unthread right underwear leg, Thread/unthread left underwear leg, Pull underwear up/down   Pants- Performed by patient: Thread/unthread right pants leg, Thread/unthread left pants leg, Pull pants up/down   Non-skid slipper socks- Performed by patient: Don/doff right sock, Don/doff left sock Non-skid slipper socks- Performed by helper: Don/doff right sock, Don/doff left sock Socks - Performed by patient: Don/doff right sock, Don/doff left sock   Shoes - Performed by patient: Don/doff right shoe, Don/doff left shoe, Fasten right, Fasten left Shoes - Performed by helper: Don/doff right shoe, Don/doff left shoe, Fasten right, Fasten left     TED Hose - Performed by patient: Don/doff right TED hose,  Don/doff left TED hose    Lower body assist Assist for lower body dressing: More than reasonable time      Toileting Toileting   Toileting steps completed by patient: Adjust clothing prior to toileting, Performs perineal hygiene, Adjust clothing after toileting    Toileting Assistive Devices: Grab bar or rail  Toileting assist Assist level: No help/no cues   Transfers Chair/bed transfer   Chair/bed transfer method: Ambulatory Chair/bed transfer assist level: No help, no cues Chair/bed transfer assistive device: Armrests     Locomotion Ambulation     Max distance: 150 Assist level: Supervision or verbal cues   Wheelchair   Type: Manual Max wheelchair distance: 50 Assist Level: Supervision or verbal cues  Cognition Comprehension Comprehension assist level: Follows complex conversation/direction with no assist  Expression Expression assist level: Expresses complex ideas: With no assist  Social Interaction Social Interaction assist level: Interacts appropriately with others with medication or extra time (anti-anxiety, antidepressant).  Problem Solving Problem solving assist level: Solves basic problems with no assist  Memory Memory assist level: More than reasonable amount of time   Medical Problem List and Plan:  1. Debilitation with encephalopathy secondary to acute respiratory failure, orientation improving-  2. DVT Prophylaxis/Anticoagulation: Subcutaneous heparin indicated at present 3. Pain Management/musculoskeletal chest and back pain: Tylenol as needed.  4. Mood: Continue Prozac 40 mg daily  -follow-up PCP 5. Neuropsych: This patient is capable of making decisions on her own behalf. 6. Skin/Wound Care: Routine skin checks 7. Fluids/Electrolytes/Nutrition: Routine I's and O's.  8. Tobacco abuse  Provide counseling.   9. Dysphagia. resolved  -encourage fluids   10.hypoxemia improving- encourage off O2  -she is using IS, respiratory muscle strengthening with speech therapy  -reviewed breathing techniques and discussed anxiety impact also  -oxygen as needed, anticipate need for home oxygen given desaturation with exercise less than 90  LOS (Days) 13 A FACE TO FACE EVALUATION WAS PERFORMED  Marlene Pfluger E 06/12/2015  8:51 AM

## 2015-06-12 NOTE — Progress Notes (Signed)
Social Work  Discharge Note  The overall goal for the admission was met for:   Discharge location: [place] HER 63 YO DAUGHTER-[name]  Length of Stay: Yes-13 DAYS  Discharge activity level: Yes-MOD/I LEVEL  Home/community participation: Yes  Services provided included: MD, RD, PT, OT, SLP, RN, CM, TR, Pharmacy and SW  Financial Services: Private Insurance: [employer]  Follow-up services arranged: Outpatient: [facility]-OPPT 2/8 10:30-11:45  Comments (or additional information):PT DID VERY WELL AND WAS WEANED OFF O2. ONLY RECOMMENDATION IS OPPT AND APPOINTMENT MADE AND GAVE HER DIRECTIONS TO THE CENTER.  Patient/Family verbalized understanding of follow-up arrangements: Yes  Individual responsible for coordination of the follow-up plan: SELF & MELISSA-DAUGHTER  Confirmed correct DME delivered: Elease Hashimoto 06/12/2015    Elease Hashimoto

## 2015-06-18 ENCOUNTER — Ambulatory Visit: Payer: BLUE CROSS/BLUE SHIELD | Attending: Physical Medicine & Rehabilitation | Admitting: Physical Therapy

## 2015-06-18 DIAGNOSIS — R29898 Other symptoms and signs involving the musculoskeletal system: Secondary | ICD-10-CM | POA: Insufficient documentation

## 2015-06-18 DIAGNOSIS — R262 Difficulty in walking, not elsewhere classified: Secondary | ICD-10-CM | POA: Diagnosis present

## 2015-06-18 DIAGNOSIS — R5381 Other malaise: Secondary | ICD-10-CM

## 2015-06-18 NOTE — Therapy (Signed)
[facility] [address] [place], [place], [address] Phone: [phone]   Fax:  [phone]  Physical Therapy Evaluation  Patient Details  Name: Julia Massey MRN: [id number] Date of Birth: 1950-11-08 Referring Provider: Dr Claudette Laws, MD  Encounter Date: 06/18/2015      PT End of Session - 06/18/15 1225    Visit Number 1   Number of Visits 8   Date for PT Re-Evaluation 07/16/15   PT Start Time 1100   PT Stop Time 1142   PT Time Calculation (min) 42 min   Equipment Utilized During Treatment Gait belt   Activity Tolerance Patient tolerated treatment well   Behavior During Therapy [facility] for tasks assessed/performed      No past medical history on file.  No past surgical history on file.  There were no vitals filed for this visit.  Visit Diagnosis:  Debility - Plan: PT plan of care cert/re-cert  Difficulty walking - Plan: PT plan of care cert/re-cert  Weakness of both lower extremities - Plan: PT plan of care cert/re-cert      Subjective Assessment - 06/18/15 1101    Subjective Pt is a 65 y/o female who presents to OPPT due to aspiration PNA resulting in prolonged intubation, encephalopathy and debility.  Pt hospitalized with inpatient rehab admission x 2 weeks and d/c'ed 06/12/15 from CIR.  Pt presents to OPPT with deconditioning and debility.   Pertinent History CHF, depression, HLD, pulmonary HTN   Limitations Walking;House hold activities   Patient Stated Goals improve endurance; play with grandchildren   Currently in Pain? No/denies  neck/back stiffness; will monitor but not directly address            [facility] PT Assessment - 06/18/15 1107    Assessment   Medical Diagnosis encephalopathy   Referring Provider Dr Claudette Laws, MD   Onset Date/Surgical Date 05/14/15   Next MD Visit PRN   Prior Therapy [facility]   Precautions   Precautions None   Restrictions   Weight Bearing Restrictions No   Balance Screen   Has the patient fallen in the past 6 months No   How many times? 2 trips over animals; did not fall   Has the patient had a decrease in activity level because of a fear of falling?  Yes   Is the patient reluctant to leave their home because of a fear of falling?  Yes  alone   Home Environment   Living Environment Private residence   Living Arrangements Children  49 y/o Massey during day; Julia Massey    Available Help at Discharge Family;Available 24 hours/day   Type of Home House   Home Access Stairs to enter   Entrance Stairs-Number of Steps 1   Entrance Stairs-Rails None   Home Layout Two level;Bed/bath upstairs   Alternate Level Stairs-Number of Steps 14   Alternate Level Stairs-Rails Left   Home Equipment None   Prior Function   Level of Independence Independent   Vocation Retired   Leisure play with grandchildren, gardening, reading   Cognition   Overall Cognitive Status Within Functional Limits for tasks assessed   Observation/Other Assessments   Focus on Therapeutic Outcomes (FOTO)  45 (55% limited; predicted 34% limited)   Activities of Balance Confidence Scale (ABC Scale)  17.5%   Strength   Overall Strength Comments tested in sitting   Strength Assessment Site Hip;Knee;Ankle   Right Hip Flexion 3+/5   Right Hip ABduction 4/5  Left Hip Flexion 3+/5   Left Hip ABduction 4/5   Right Knee Flexion 4/5   Right Knee Extension 5/5   Left Knee Flexion 4/5   Left Knee Extension 5/5   Right Ankle Dorsiflexion 3/5   Left Ankle Dorsiflexion 3/5   Ambulation/Gait   Assistive device None   Ambulation Surface Level;Indoor   Gait Comments pt independent with ambulation; see 6 min walk details   6 Minute Walk- Baseline   6 Minute Walk- Baseline yes   BP (mmHg) 118/78 mmHg   HR (bpm) 95   02 Sat (%RA) 94 %   Modified Borg Scale for Dyspnea 0.5- Very, very slight shortness of breath   Perceived Rate of Exertion (Borg) 7- Very, very light   6 Minute  walk- Post Test   6 Minute Walk Post Test yes   BP (mmHg) 111/77 mmHg   HR (bpm) 109   02 Sat (%RA) 90 %   Modified Borg Scale for Dyspnea 3- Moderate shortness of breath or breathing difficulty   Perceived Rate of Exertion (Borg) 14-   6 minute walk test results    Aerobic Endurance Distance Walked 1253   Standardized Balance Assessment   Standardized Balance Assessment Berg Balance Test   Berg Balance Test   Sit to Stand Able to stand without using hands and stabilize independently   Standing Unsupported Able to stand safely 2 minutes   Sitting with Back Unsupported but Feet Supported on Floor or Stool Able to sit safely and securely 2 minutes   Stand to Sit Sits safely with minimal use of hands   Transfers Able to transfer safely, minor use of hands   Standing Unsupported with Eyes Closed Able to stand 10 seconds safely   Standing Ubsupported with Feet Together Able to place feet together independently and stand 1 minute safely   From Standing, Reach Forward with Outstretched Arm Can reach confidently >25 cm (10")   From Standing Position, Pick up Object from Floor Able to pick up shoe safely and easily   From Standing Position, Turn to Look Behind Over each Shoulder Looks behind from both sides and weight shifts well   Turn 360 Degrees Able to turn 360 degrees safely in 4 seconds or less   Standing Unsupported, Alternately Place Feet on Step/Stool Able to stand independently and safely and complete 8 steps in 20 seconds   Standing Unsupported, One Foot in Front Able to place foot tandem independently and hold 30 seconds   Standing on One Leg Able to lift leg independently and hold > 10 seconds   Total Score 56                           PT Education - 06/18/15 1225    Education provided Yes   Education Details POC and goals of care   Person(s) Educated Patient   Methods Explanation   Comprehension Verbalized understanding             PT Long Term  Goals - 06/18/15 1229    PT LONG TERM GOAL #1   Title independent with HEP (07/16/15)   Time 4   Period Weeks   Status New   PT LONG TERM GOAL #2   Title improve 6 min walk test to > 1500' with RPE < 10 for improved function and endurance (07/16/15)   Time 4   Period Weeks   Status New   PT LONG TERM  GOAL #3   Title verbalize understanding of community fitness opportunities (07/16/15)   Time 4   Period Weeks   Status New   PT LONG TERM GOAL #4   Title demonstrate bil dorsiflexion strength at least 4/5 for improved function and mobility (07/16/15)   Time 4   Period Weeks   Status New               Plan - 06/18/15 1226    Clinical Impression Statement Pt is a 65 y/o female who presents to OPPT with strength and endurance/activity tolerance deficits due to acute respiratory failure and encephalopathy.  Pt demonstrated decreased endurance and strength affecting functional mobility and ability to return to prior level of function.  Pt will benefit from PT to maximize function and improve strength and endurance.   Pt will benefit from skilled therapeutic intervention in order to improve on the following deficits Cardiopulmonary status limiting activity;Decreased strength;Decreased activity tolerance   Rehab Potential Excellent   PT Frequency 2x / week   PT Duration 4 weeks   PT Treatment/Interventions ADLs/Self Care Home Management;Neuromuscular re-education;Therapeutic exercise;Therapeutic activities;Functional mobility training;Stair training;Gait training;Patient/family education;Energy conservation   PT Next Visit Plan monitor O2 sats; hip/ankle strengthening and strategies; exercises on ramp, give HEP and assist with community fitness options   Consulted and Agree with Plan of Care Patient         Problem List Patient Active Problem List   Diagnosis Date Noted  . Encephalopathy 05/30/2015  . Atypical pneumonia   . Acute pulmonary edema (HCC)   . Chronic diastolic CHF  (congestive heart failure) (HCC)   . Pulmonary hypertension (HCC)   . HLD (hyperlipidemia)   . Hypernatremia   . Anxiety state   . Depression   . Acute encephalopathy   . Acute respiratory failure with hypoxemia (HCC)   . Altered mental status   . COPD exacerbation (HCC)   . Encephalopathy acute   . ARDS (adult respiratory distress syndrome) (HCC)   . Encounter for orogastric (OG) tube placement   . Respiratory failure (HCC)   . Acute respiratory failure with hypoxia (HCC) 05/14/2015  . Malnutrition of moderate degree 05/14/2015   Clarita Crane, PT, DPT 06/18/2015 12:34 PM  [facility] [address] [place], [place], [address] Phone: [phone]   Fax:  [phone]  Name: LOTTA AUKER MRN: [id number] Date of Birth: 12-23-1950

## 2015-06-25 ENCOUNTER — Ambulatory Visit: Payer: BLUE CROSS/BLUE SHIELD | Admitting: Physical Therapy

## 2015-06-25 VITALS — BP 105/64 | HR 97

## 2015-06-25 DIAGNOSIS — R5381 Other malaise: Secondary | ICD-10-CM | POA: Diagnosis not present

## 2015-06-25 DIAGNOSIS — R262 Difficulty in walking, not elsewhere classified: Secondary | ICD-10-CM

## 2015-06-25 DIAGNOSIS — R29898 Other symptoms and signs involving the musculoskeletal system: Secondary | ICD-10-CM

## 2015-06-25 NOTE — Patient Instructions (Signed)
Bridging    Slowly raise buttocks from floor, keeping stomach tight.  Hold 3-5 seconds. Repeat __10__ times per set. Do __1__ sets per session. Do _2-3___ sessions per day.  http://orth.exer.us/1097   Copyright  VHI. All rights reserved.   Clam Shell 45 Degrees    Lying with hips and knees bent 45, one pillow between knees and ankles. Lift knee. Be sure pelvis does not roll backward. Do not arch back.  Use band around thighs. Do _10__ times, each leg, _2-3__ times per day.  http://ss.exer.us/75   Copyright  VHI. All rights reserved.   Pelvic Tilt    Flatten back by tightening stomach muscles and buttocks.  Hold 5 seconds. Repeat __10__ times per set. Do _1___ sets per session. Do __2-3__ sessions per day.  http://orth.exer.us/135   Copyright  VHI. All rights reserved.

## 2015-06-25 NOTE — Therapy (Signed)
[facility] [address] [place], [place], [address] Phone: [phone]   Fax:  [phone]  Physical Therapy Treatment  Patient Details  Name: Julia Massey MRN: [id number] Date of Birth: 11/01/1950 Referring Provider: Dr Claudette Laws, MD  Encounter Date: 06/25/2015      PT End of Session - 06/25/15 1309    Visit Number 2   Number of Visits 8   Date for PT Re-Evaluation 07/16/15   PT Start Time 1231   PT Stop Time 1312   PT Time Calculation (min) 41 min   Equipment Utilized During Treatment Gait belt   Activity Tolerance Patient tolerated treatment well   Behavior During Therapy [facility] for tasks assessed/performed      No past medical history on file.  No past surgical history on file.  Filed Vitals:   06/25/15 1236  BP: 105/64  Pulse: 97  SpO2: 96%    Visit Diagnosis:  Difficulty walking  Weakness of both lower extremities  Debility      Subjective Assessment - 06/25/15 1236    Subjective A little sore after eval; feels a little tired today.   Patient Stated Goals improve endurance; play with grandchildren   Currently in Pain? No/denies        Static standing on ramp: calf raises facing uphill 2 x 10; toe raises 2 x 10 facing downhill (increased difficulty without substitutions; trunk rotation with ball toss x 10 facing uphill on solid and compliant surface -Marching x10 bil 2#; hamstring curls x10 bil 2# (mod cues for correct technique); O2 then decreased to 86% with return to 96% with pursed lip breathing and 2 min rest break -Supine SLR x10 bil 2#; sidelying SLR hip abdct x 10 bil 2#; prone hip ext x 10 bil 2#, bridging x 10; TrA 10x5 sec with cues for breathing; sidelying hip abd/er x 10 bil with red theraband                          PT Education - 06/25/15 1308    Education provided Yes   Education Details HEP   Person(s) Educated Patient   Methods  Explanation;Demonstration;Handout   Comprehension Verbalized understanding;Returned demonstration;Need further instruction             PT Long Term Goals - 06/18/15 1229    PT LONG TERM GOAL #1   Title independent with HEP (07/16/15)   Time 4   Period Weeks   Status New   PT LONG TERM GOAL #2   Title improve 6 min walk test to > 1500' with RPE < 10 for improved function and endurance (07/16/15)   Time 4   Period Weeks   Status New   PT LONG TERM GOAL #3   Title verbalize understanding of community fitness opportunities (07/16/15)   Time 4   Period Weeks   Status New   PT LONG TERM GOAL #4   Title demonstrate bil dorsiflexion strength at least 4/5 for improved function and mobility (07/16/15)   Time 4   Period Weeks   Status New               Plan - 06/25/15 1309    Clinical Impression Statement Pt O2 sats decreased to 86% with standing exercises; quickly returned to 96% with pursed lip breathing and seated rest break.  Demonstrates some high level balance deficits on ramp and unlevel surfaces.   PT Next Visit  Plan monitor O2 sats; hip/ankle strengthening and strategies; exercises on ramp, give HEP and assist with community fitness options; review HEP        Problem List Patient Active Problem List   Diagnosis Date Noted  . Encephalopathy 05/30/2015  . Atypical pneumonia   . Acute pulmonary edema (HCC)   . Chronic diastolic CHF (congestive heart failure) (HCC)   . Pulmonary hypertension (HCC)   . HLD (hyperlipidemia)   . Hypernatremia   . Anxiety state   . Depression   . Acute encephalopathy   . Acute respiratory failure with hypoxemia (HCC)   . Altered mental status   . COPD exacerbation (HCC)   . Encephalopathy acute   . ARDS (adult respiratory distress syndrome) (HCC)   . Encounter for orogastric (OG) tube placement   . Respiratory failure (HCC)   . Acute respiratory failure with hypoxia (HCC) 05/14/2015  . Malnutrition of moderate degree 05/14/2015    Clarita Crane, PT, DPT 06/25/2015 1:12 PM  [facility] [address] [place], [place], [address] Phone: [phone]   Fax:  [phone]  Name: LEXI CONATY MRN: [id number] Date of Birth: 1950/07/19

## 2015-06-30 ENCOUNTER — Ambulatory Visit: Payer: BLUE CROSS/BLUE SHIELD | Admitting: Physical Therapy

## 2015-07-02 ENCOUNTER — Ambulatory Visit: Payer: BLUE CROSS/BLUE SHIELD | Admitting: Physical Therapy

## 2015-07-04 ENCOUNTER — Ambulatory Visit: Payer: BLUE CROSS/BLUE SHIELD | Admitting: Physical Therapy

## 2015-07-07 ENCOUNTER — Ambulatory Visit: Payer: BLUE CROSS/BLUE SHIELD | Admitting: Physical Therapy

## 2015-07-07 ENCOUNTER — Encounter: Payer: Self-pay | Admitting: Physical Therapy

## 2015-07-07 DIAGNOSIS — R5381 Other malaise: Secondary | ICD-10-CM

## 2015-07-07 DIAGNOSIS — R262 Difficulty in walking, not elsewhere classified: Secondary | ICD-10-CM

## 2015-07-07 DIAGNOSIS — R29898 Other symptoms and signs involving the musculoskeletal system: Secondary | ICD-10-CM

## 2015-07-07 NOTE — Therapy (Addendum)
[facility] [address] [place], [place], [address] Phone: [phone]   Fax:  [phone]  Physical Therapy Treatment  Patient Details  Name: Julia Massey MRN: [id number] Date of Birth: 06/21/1950 Referring Provider: Dr Alysia Penna, MD  Encounter Date: 07/07/2015      PT End of Session - 07/07/15 1249    Visit Number 3   Number of Visits 8   Date for PT Re-Evaluation 07/16/15   PT Start Time 1247  pt late due to traffic   PT Stop Time 1315   PT Time Calculation (min) 28 min   Equipment Utilized During Treatment Gait belt   Activity Tolerance Patient tolerated treatment well   Behavior During Therapy [facility] for tasks assessed/performed      History reviewed. No pertinent past medical history.  History reviewed. No pertinent past surgical history.  There were no vitals filed for this visit.  Visit Diagnosis:  Difficulty walking  Weakness of both lower extremities  Debility      Subjective Assessment - 07/07/15 1248    Subjective No new complaints. No falls to report. Some soreness in her lower back. Reports the exercises are going okay at home.   Pertinent History CHF, depression, HLD, pulmonary HTN   Limitations Walking;House hold activities   Patient Stated Goals improve endurance; play with grandchildren   Currently in Pain? No/denies   Pain Score 0-No pain            [facility] PT Assessment - 07/07/15 1251    6 Minute Walk- Baseline   6 Minute Walk- Baseline yes   BP (mmHg) 102/65 mmHg   HR (bpm) 93   02 Sat (%RA) 98 %   Modified Borg Scale for Dyspnea 0- Nothing at all   Perceived Rate of Exertion (Borg) 6-   6 Minute walk- Post Test   BP (mmHg) 118/60 mmHg   HR (bpm) 99   02 Sat (%RA) 93 %   Modified Borg Scale for Dyspnea 0.5- Very, very slight shortness of breath   Perceived Rate of Exertion (Borg) 9- very light   6 minute walk test results    Aerobic Endurance Distance Walked  1446     exercises: for ankle strengthening  On ramp Heel raises while facing up, 2 sets of 10 reps Toe raises while facing down, 2 sets of 10 reps  On blue foam beam: Standing with feet across beam - alternating forward heel taps x 10 reps - alternating backward toe taps x 10 reps Min guard to min assist with cues on posture and weight shifting  Blue beam next to countertop: intermittent UE support to counter for balance, cues on posture and ex form. Tandem walking x 3 laps each way Side stepping left<>right x 3 laps each way         PT Long Term Goals - 07/07/15 1249    PT LONG TERM GOAL #1   Title independent with HEP (07/16/15)   Time 4   Period Weeks   Status New   PT LONG TERM GOAL #2   Title improve 6 min walk test to > 1500' with RPE < 10 for improved function and endurance (07/16/15)   Time 4   Period Weeks   Status New   PT LONG TERM GOAL #3   Title verbalize understanding of community fitness opportunities (07/16/15)   Baseline 06/27/15: planning to join [employer]   Time 4   Period [place]  Status Partially Met   PT LONG TERM GOAL #4   Title demonstrate bil dorsiflexion strength at least 4/5 for improved function and mobility (07/16/15)   Baseline 07/07/15: 4/5 on righ , 5/5 on left   Time 4   Period Weeks   Status New           Plan - 07/07/15 1249    Clinical Impression Statement Pt expressing feelings that she does not feel the needed to keep coming to therapy as she feels she is doing better, therefore began checking goals. Pt with improved distance on 6 minute walk test, however not to goal distance. Pt also demo'd improved ankle strengthening on manual muscle testing today. Pt has not joined the gym as yet, was waiting to be cleared by therapy (she plans to join the [facility] near her house). Discussed with pt joining and attending/working out at least once before her next appointment on Friday to ensure she does not have any issues/questions before being  formally discharged from therapy. Pt agreed with this plan.                                                           Pt will benefit from skilled therapeutic intervention in order to improve on the following deficits Cardiopulmonary status limiting activity;Decreased strength;Decreased activity tolerance   Rehab Potential Excellent   PT Frequency 2x / week   PT Duration 4 weeks   PT Treatment/Interventions ADLs/Self Care Home Management;Neuromuscular re-education;Therapeutic exercise;Therapeutic activities;Functional mobility training;Stair training;Gait training;Patient/family education;Energy conservation   PT Next Visit Plan assess how gym program is going and determine plan for therapy as pt feels she does not need to continue at this time   Consulted and Agree with Plan of Care Patient        Problem List Patient Active Problem List   Diagnosis Date Noted  . Encephalopathy 05/30/2015  . Atypical pneumonia   . Acute pulmonary edema (HCC)   . Chronic diastolic CHF (congestive heart failure) ([place])   . Pulmonary hypertension ([place])   . HLD (hyperlipidemia)   . Hypernatremia   . Anxiety state   . Depression   . Acute encephalopathy   . Acute respiratory failure with hypoxemia ([place])   . Altered mental status   . COPD exacerbation ([place])   . Encephalopathy acute   . ARDS (adult respiratory distress syndrome) ([place])   . Encounter for orogastric (OG) tube placement   . Respiratory failure ([place])   . Acute respiratory failure with hypoxia ([place]) 05/14/2015  . Malnutrition of moderate degree 05/14/2015    Willow Ora 07/07/2015, 1:43 PM  Willow Ora, PTA, [facility] [address] [place], [place] [address] [phone] 07/07/2015, 1:43 PM   Name: Julia Massey MRN: [id number] Date of Birth: 09/08/1950        PHYSICAL THERAPY DISCHARGE SUMMARY  Visits from Start of Care: 3  Current functional level related to goals / functional  outcomes: See above   Remaining deficits: Unknown as pt did not return   Education / Equipment: HEP  Plan: Patient agrees to discharge.  Patient goals were not met. Patient is being discharged due to not returning since the last visit.  ?????    Laureen Abrahams, PT, DPT 10/01/2015 11:12 AM  [facility] [place], [place] [address]  [phone] (office) [phone] (fax)

## 2015-07-09 ENCOUNTER — Ambulatory Visit: Payer: BLUE CROSS/BLUE SHIELD | Admitting: Physical Therapy

## 2015-07-14 ENCOUNTER — Telehealth: Payer: Self-pay | Admitting: Physical Therapy

## 2015-07-14 ENCOUNTER — Ambulatory Visit: Payer: BLUE CROSS/BLUE SHIELD | Attending: Physical Medicine & Rehabilitation | Admitting: Physical Therapy

## 2015-07-14 NOTE — Telephone Encounter (Signed)
Left a message about missed visit today and the need to reschedule her next visit on Thursday 07/17/15 due to staff meeting at that time. Requested pt to call back to move appointment times on Thursday and left number for clinic.  Sallyanne Kuster, PTA, [facility] [address], [address] [place], [place] [address] [phone] 07/14/2015, 2:34 PM

## 2015-07-17 ENCOUNTER — Ambulatory Visit: Payer: BLUE CROSS/BLUE SHIELD | Admitting: Physical Therapy

## 2015-09-12 ENCOUNTER — Ambulatory Visit (HOSPITAL_COMMUNITY)
Admission: RE | Admit: 2015-09-12 | Discharge: 2015-09-12 | Disposition: A | Discharge status: Home / Self Care | Payer: BLUE CROSS/BLUE SHIELD | Source: Other Acute Inpatient Hospital | Attending: Pulmonary Disease | Admitting: Pulmonary Disease

## 2015-09-12 ENCOUNTER — Other Ambulatory Visit (HOSPITAL_COMMUNITY): Payer: Self-pay | Admitting: Pulmonary Disease

## 2015-09-12 ENCOUNTER — Ambulatory Visit (HOSPITAL_COMMUNITY)
Admission: RE | Admit: 2015-09-12 | Discharge: 2015-09-12 | Disposition: A | Discharge status: Home / Self Care | Payer: BLUE CROSS/BLUE SHIELD | Source: Ambulatory Visit | Attending: Pulmonary Disease | Admitting: Pulmonary Disease

## 2015-09-12 DIAGNOSIS — J80 Acute respiratory distress syndrome: Secondary | ICD-10-CM | POA: Diagnosis present

## 2015-09-12 DIAGNOSIS — R918 Other nonspecific abnormal finding of lung field: Secondary | ICD-10-CM | POA: Insufficient documentation

## 2015-11-26 ENCOUNTER — Other Ambulatory Visit (HOSPITAL_COMMUNITY): Payer: Self-pay | Admitting: Pulmonary Disease

## 2015-11-26 DIAGNOSIS — K219 Gastro-esophageal reflux disease without esophagitis: Secondary | ICD-10-CM

## 2015-11-26 DIAGNOSIS — R059 Cough, unspecified: Secondary | ICD-10-CM

## 2015-11-26 DIAGNOSIS — R05 Cough: Secondary | ICD-10-CM

## 2015-11-28 ENCOUNTER — Ambulatory Visit (HOSPITAL_COMMUNITY)
Admission: RE | Admit: 2015-11-28 | Discharge: 2015-11-28 | Disposition: A | Discharge status: Home / Self Care | Payer: BLUE CROSS/BLUE SHIELD | Source: Ambulatory Visit | Attending: Pulmonary Disease | Admitting: Pulmonary Disease

## 2015-11-28 DIAGNOSIS — K219 Gastro-esophageal reflux disease without esophagitis: Secondary | ICD-10-CM | POA: Diagnosis not present

## 2015-11-28 DIAGNOSIS — R05 Cough: Secondary | ICD-10-CM | POA: Diagnosis not present

## 2015-11-28 DIAGNOSIS — R933 Abnormal findings on diagnostic imaging of other parts of digestive tract: Secondary | ICD-10-CM | POA: Insufficient documentation

## 2015-11-28 DIAGNOSIS — R059 Cough, unspecified: Secondary | ICD-10-CM

## 2017-02-14 ENCOUNTER — Other Ambulatory Visit (HOSPITAL_COMMUNITY): Payer: Self-pay | Admitting: Pulmonary Disease

## 2017-02-14 DIAGNOSIS — R1310 Dysphagia, unspecified: Secondary | ICD-10-CM

## 2017-02-17 ENCOUNTER — Ambulatory Visit (HOSPITAL_COMMUNITY): Payer: BLUE CROSS/BLUE SHIELD

## 2017-05-26 ENCOUNTER — Ambulatory Visit (HOSPITAL_COMMUNITY)
Admission: RE | Admit: 2017-05-26 | Discharge: 2017-05-26 | Disposition: A | Discharge status: Home / Self Care | Payer: BLUE CROSS/BLUE SHIELD | Source: Ambulatory Visit | Attending: Pulmonary Disease | Admitting: Pulmonary Disease

## 2017-05-26 ENCOUNTER — Other Ambulatory Visit (HOSPITAL_COMMUNITY): Payer: Self-pay | Admitting: Pulmonary Disease

## 2017-05-26 DIAGNOSIS — R058 Other specified cough: Secondary | ICD-10-CM

## 2017-05-26 DIAGNOSIS — R918 Other nonspecific abnormal finding of lung field: Secondary | ICD-10-CM | POA: Insufficient documentation

## 2017-05-26 DIAGNOSIS — J849 Interstitial pulmonary disease, unspecified: Secondary | ICD-10-CM | POA: Diagnosis not present

## 2017-05-26 DIAGNOSIS — J42 Unspecified chronic bronchitis: Secondary | ICD-10-CM

## 2017-05-26 DIAGNOSIS — R05 Cough: Secondary | ICD-10-CM | POA: Insufficient documentation

## 2017-05-26 DIAGNOSIS — I517 Cardiomegaly: Secondary | ICD-10-CM | POA: Insufficient documentation

## 2017-07-21 DIAGNOSIS — M6240 Contracture of muscle, unspecified site: Secondary | ICD-10-CM | POA: Diagnosis not present

## 2017-07-21 DIAGNOSIS — K210 Gastro-esophageal reflux disease with esophagitis: Secondary | ICD-10-CM | POA: Diagnosis not present

## 2017-07-21 DIAGNOSIS — J302 Other seasonal allergic rhinitis: Secondary | ICD-10-CM | POA: Diagnosis not present

## 2017-07-21 DIAGNOSIS — R69 Illness, unspecified: Secondary | ICD-10-CM | POA: Diagnosis not present

## 2017-07-21 DIAGNOSIS — E7800 Pure hypercholesterolemia, unspecified: Secondary | ICD-10-CM | POA: Diagnosis not present

## 2017-07-21 DIAGNOSIS — R252 Cramp and spasm: Secondary | ICD-10-CM | POA: Diagnosis not present

## 2017-07-21 DIAGNOSIS — J441 Chronic obstructive pulmonary disease with (acute) exacerbation: Secondary | ICD-10-CM | POA: Diagnosis not present

## 2017-07-21 DIAGNOSIS — Z79891 Long term (current) use of opiate analgesic: Secondary | ICD-10-CM | POA: Diagnosis not present

## 2017-07-21 DIAGNOSIS — J385 Laryngeal spasm: Secondary | ICD-10-CM | POA: Diagnosis not present

## 2017-08-29 ENCOUNTER — Other Ambulatory Visit (HOSPITAL_COMMUNITY): Payer: Self-pay | Admitting: Pulmonary Disease

## 2017-08-29 ENCOUNTER — Ambulatory Visit (HOSPITAL_COMMUNITY)
Admission: RE | Admit: 2017-08-29 | Discharge: 2017-08-29 | Disposition: A | Discharge status: Home / Self Care | Payer: Medicare HMO | Source: Ambulatory Visit | Attending: Pulmonary Disease | Admitting: Pulmonary Disease

## 2017-08-29 DIAGNOSIS — R05 Cough: Secondary | ICD-10-CM | POA: Insufficient documentation

## 2017-08-29 DIAGNOSIS — Z8701 Personal history of pneumonia (recurrent): Secondary | ICD-10-CM

## 2017-08-29 DIAGNOSIS — J849 Interstitial pulmonary disease, unspecified: Secondary | ICD-10-CM | POA: Insufficient documentation

## 2017-08-29 DIAGNOSIS — R058 Other specified cough: Secondary | ICD-10-CM

## 2017-08-29 DIAGNOSIS — R0602 Shortness of breath: Secondary | ICD-10-CM | POA: Diagnosis not present

## 2017-09-04 DIAGNOSIS — I509 Heart failure, unspecified: Secondary | ICD-10-CM | POA: Diagnosis not present

## 2017-09-04 DIAGNOSIS — J441 Chronic obstructive pulmonary disease with (acute) exacerbation: Secondary | ICD-10-CM | POA: Diagnosis not present

## 2017-09-04 DIAGNOSIS — R69 Illness, unspecified: Secondary | ICD-10-CM | POA: Diagnosis not present

## 2017-09-04 DIAGNOSIS — K219 Gastro-esophageal reflux disease without esophagitis: Secondary | ICD-10-CM | POA: Diagnosis not present

## 2017-09-29 DIAGNOSIS — R252 Cramp and spasm: Secondary | ICD-10-CM | POA: Diagnosis not present

## 2017-09-29 DIAGNOSIS — R69 Illness, unspecified: Secondary | ICD-10-CM | POA: Diagnosis not present

## 2017-09-29 DIAGNOSIS — J302 Other seasonal allergic rhinitis: Secondary | ICD-10-CM | POA: Diagnosis not present

## 2017-09-29 DIAGNOSIS — Z79891 Long term (current) use of opiate analgesic: Secondary | ICD-10-CM | POA: Diagnosis not present

## 2017-09-29 DIAGNOSIS — M1990 Unspecified osteoarthritis, unspecified site: Secondary | ICD-10-CM | POA: Diagnosis not present

## 2017-09-29 DIAGNOSIS — J385 Laryngeal spasm: Secondary | ICD-10-CM | POA: Diagnosis not present

## 2017-09-29 DIAGNOSIS — E7800 Pure hypercholesterolemia, unspecified: Secondary | ICD-10-CM | POA: Diagnosis not present

## 2017-09-29 DIAGNOSIS — J441 Chronic obstructive pulmonary disease with (acute) exacerbation: Secondary | ICD-10-CM | POA: Diagnosis not present

## 2017-09-29 DIAGNOSIS — K210 Gastro-esophageal reflux disease with esophagitis: Secondary | ICD-10-CM | POA: Diagnosis not present

## 2017-11-29 DIAGNOSIS — Z79891 Long term (current) use of opiate analgesic: Secondary | ICD-10-CM | POA: Diagnosis not present

## 2017-11-29 DIAGNOSIS — J385 Laryngeal spasm: Secondary | ICD-10-CM | POA: Diagnosis not present

## 2017-11-29 DIAGNOSIS — R69 Illness, unspecified: Secondary | ICD-10-CM | POA: Diagnosis not present

## 2017-11-29 DIAGNOSIS — E7800 Pure hypercholesterolemia, unspecified: Secondary | ICD-10-CM | POA: Diagnosis not present

## 2017-11-29 DIAGNOSIS — R252 Cramp and spasm: Secondary | ICD-10-CM | POA: Diagnosis not present

## 2017-11-29 DIAGNOSIS — J441 Chronic obstructive pulmonary disease with (acute) exacerbation: Secondary | ICD-10-CM | POA: Diagnosis not present

## 2017-11-29 DIAGNOSIS — K210 Gastro-esophageal reflux disease with esophagitis: Secondary | ICD-10-CM | POA: Diagnosis not present

## 2017-11-29 DIAGNOSIS — J302 Other seasonal allergic rhinitis: Secondary | ICD-10-CM | POA: Diagnosis not present

## 2017-11-29 DIAGNOSIS — M159 Polyosteoarthritis, unspecified: Secondary | ICD-10-CM | POA: Diagnosis not present

## 2018-02-06 DIAGNOSIS — M129 Arthropathy, unspecified: Secondary | ICD-10-CM | POA: Diagnosis not present

## 2018-02-06 DIAGNOSIS — M545 Low back pain: Secondary | ICD-10-CM | POA: Diagnosis not present

## 2018-02-06 DIAGNOSIS — E559 Vitamin D deficiency, unspecified: Secondary | ICD-10-CM | POA: Diagnosis not present

## 2018-02-06 DIAGNOSIS — Z79899 Other long term (current) drug therapy: Secondary | ICD-10-CM | POA: Diagnosis not present

## 2018-02-21 DIAGNOSIS — M546 Pain in thoracic spine: Secondary | ICD-10-CM | POA: Diagnosis not present

## 2018-02-21 DIAGNOSIS — M542 Cervicalgia: Secondary | ICD-10-CM | POA: Diagnosis not present

## 2018-02-21 DIAGNOSIS — M25511 Pain in right shoulder: Secondary | ICD-10-CM | POA: Diagnosis not present

## 2018-02-21 DIAGNOSIS — M545 Low back pain: Secondary | ICD-10-CM | POA: Diagnosis not present

## 2018-02-22 DIAGNOSIS — Z79899 Other long term (current) drug therapy: Secondary | ICD-10-CM | POA: Diagnosis not present

## 2018-02-22 DIAGNOSIS — M545 Low back pain: Secondary | ICD-10-CM | POA: Diagnosis not present

## 2018-03-02 DIAGNOSIS — M159 Polyosteoarthritis, unspecified: Secondary | ICD-10-CM | POA: Diagnosis not present

## 2018-03-02 DIAGNOSIS — E7800 Pure hypercholesterolemia, unspecified: Secondary | ICD-10-CM | POA: Diagnosis not present

## 2018-03-02 DIAGNOSIS — J441 Chronic obstructive pulmonary disease with (acute) exacerbation: Secondary | ICD-10-CM | POA: Diagnosis not present

## 2018-03-02 DIAGNOSIS — R252 Cramp and spasm: Secondary | ICD-10-CM | POA: Diagnosis not present

## 2018-03-02 DIAGNOSIS — J385 Laryngeal spasm: Secondary | ICD-10-CM | POA: Diagnosis not present

## 2018-03-02 DIAGNOSIS — J302 Other seasonal allergic rhinitis: Secondary | ICD-10-CM | POA: Diagnosis not present

## 2018-03-02 DIAGNOSIS — K210 Gastro-esophageal reflux disease with esophagitis: Secondary | ICD-10-CM | POA: Diagnosis not present

## 2018-03-02 DIAGNOSIS — R41840 Attention and concentration deficit: Secondary | ICD-10-CM | POA: Diagnosis not present

## 2018-03-02 DIAGNOSIS — Z79891 Long term (current) use of opiate analgesic: Secondary | ICD-10-CM | POA: Diagnosis not present

## 2018-03-02 DIAGNOSIS — R69 Illness, unspecified: Secondary | ICD-10-CM | POA: Diagnosis not present

## 2018-03-14 DIAGNOSIS — M545 Low back pain: Secondary | ICD-10-CM | POA: Diagnosis not present

## 2018-03-14 DIAGNOSIS — M542 Cervicalgia: Secondary | ICD-10-CM | POA: Diagnosis not present

## 2018-03-14 DIAGNOSIS — Z79899 Other long term (current) drug therapy: Secondary | ICD-10-CM | POA: Diagnosis not present

## 2018-03-14 DIAGNOSIS — G8929 Other chronic pain: Secondary | ICD-10-CM | POA: Diagnosis not present

## 2018-03-20 DIAGNOSIS — R69 Illness, unspecified: Secondary | ICD-10-CM | POA: Diagnosis not present

## 2018-03-23 DIAGNOSIS — R0989 Other specified symptoms and signs involving the circulatory and respiratory systems: Secondary | ICD-10-CM | POA: Diagnosis not present

## 2018-03-30 DIAGNOSIS — M542 Cervicalgia: Secondary | ICD-10-CM | POA: Diagnosis not present

## 2018-03-30 DIAGNOSIS — M546 Pain in thoracic spine: Secondary | ICD-10-CM | POA: Diagnosis not present

## 2018-03-30 DIAGNOSIS — M545 Low back pain: Secondary | ICD-10-CM | POA: Diagnosis not present

## 2018-03-30 DIAGNOSIS — Z79899 Other long term (current) drug therapy: Secondary | ICD-10-CM | POA: Diagnosis not present

## 2018-04-20 DIAGNOSIS — R69 Illness, unspecified: Secondary | ICD-10-CM | POA: Diagnosis not present

## 2018-04-20 DIAGNOSIS — M546 Pain in thoracic spine: Secondary | ICD-10-CM | POA: Diagnosis not present

## 2018-04-20 DIAGNOSIS — M545 Low back pain: Secondary | ICD-10-CM | POA: Diagnosis not present

## 2018-04-20 DIAGNOSIS — M542 Cervicalgia: Secondary | ICD-10-CM | POA: Diagnosis not present

## 2018-04-20 DIAGNOSIS — Z79899 Other long term (current) drug therapy: Secondary | ICD-10-CM | POA: Diagnosis not present

## 2018-05-08 DIAGNOSIS — R69 Illness, unspecified: Secondary | ICD-10-CM | POA: Diagnosis not present

## 2018-05-08 DIAGNOSIS — K210 Gastro-esophageal reflux disease with esophagitis: Secondary | ICD-10-CM | POA: Diagnosis not present

## 2018-05-08 DIAGNOSIS — Z8489 Family history of other specified conditions: Secondary | ICD-10-CM | POA: Diagnosis not present

## 2018-05-08 DIAGNOSIS — J441 Chronic obstructive pulmonary disease with (acute) exacerbation: Secondary | ICD-10-CM | POA: Diagnosis not present

## 2018-05-08 DIAGNOSIS — E7800 Pure hypercholesterolemia, unspecified: Secondary | ICD-10-CM | POA: Diagnosis not present

## 2018-05-08 DIAGNOSIS — M159 Polyosteoarthritis, unspecified: Secondary | ICD-10-CM | POA: Diagnosis not present

## 2018-05-08 DIAGNOSIS — Z828 Family history of other disabilities and chronic diseases leading to disablement, not elsewhere classified: Secondary | ICD-10-CM | POA: Diagnosis not present

## 2018-05-08 DIAGNOSIS — D638 Anemia in other chronic diseases classified elsewhere: Secondary | ICD-10-CM | POA: Diagnosis not present

## 2018-05-08 DIAGNOSIS — Z79899 Other long term (current) drug therapy: Secondary | ICD-10-CM | POA: Diagnosis not present

## 2018-05-08 DIAGNOSIS — Z79891 Long term (current) use of opiate analgesic: Secondary | ICD-10-CM | POA: Diagnosis not present

## 2018-05-08 DIAGNOSIS — E559 Vitamin D deficiency, unspecified: Secondary | ICD-10-CM | POA: Diagnosis not present

## 2018-05-08 DIAGNOSIS — J302 Other seasonal allergic rhinitis: Secondary | ICD-10-CM | POA: Diagnosis not present

## 2018-05-25 DIAGNOSIS — R69 Illness, unspecified: Secondary | ICD-10-CM | POA: Diagnosis not present

## 2018-06-01 DIAGNOSIS — M545 Low back pain: Secondary | ICD-10-CM | POA: Diagnosis not present

## 2018-06-01 DIAGNOSIS — Z79899 Other long term (current) drug therapy: Secondary | ICD-10-CM | POA: Diagnosis not present

## 2018-06-21 DIAGNOSIS — R69 Illness, unspecified: Secondary | ICD-10-CM | POA: Diagnosis not present

## 2018-06-21 DIAGNOSIS — Z1339 Encounter for screening examination for other mental health and behavioral disorders: Secondary | ICD-10-CM | POA: Diagnosis not present

## 2018-06-21 DIAGNOSIS — F900 Attention-deficit hyperactivity disorder, predominantly inattentive type: Secondary | ICD-10-CM | POA: Diagnosis not present

## 2018-06-28 DIAGNOSIS — R69 Illness, unspecified: Secondary | ICD-10-CM | POA: Diagnosis not present

## 2018-07-06 DIAGNOSIS — R69 Illness, unspecified: Secondary | ICD-10-CM | POA: Diagnosis not present

## 2018-07-10 DIAGNOSIS — Z79899 Other long term (current) drug therapy: Secondary | ICD-10-CM | POA: Diagnosis not present

## 2018-07-10 DIAGNOSIS — M545 Low back pain: Secondary | ICD-10-CM | POA: Diagnosis not present

## 2018-07-11 DIAGNOSIS — R69 Illness, unspecified: Secondary | ICD-10-CM | POA: Diagnosis not present

## 2018-07-19 DIAGNOSIS — R69 Illness, unspecified: Secondary | ICD-10-CM | POA: Diagnosis not present

## 2018-07-19 DIAGNOSIS — Z1339 Encounter for screening examination for other mental health and behavioral disorders: Secondary | ICD-10-CM | POA: Diagnosis not present

## 2018-07-19 DIAGNOSIS — F900 Attention-deficit hyperactivity disorder, predominantly inattentive type: Secondary | ICD-10-CM | POA: Diagnosis not present

## 2018-08-09 DIAGNOSIS — M25511 Pain in right shoulder: Secondary | ICD-10-CM | POA: Diagnosis not present

## 2018-08-09 DIAGNOSIS — M545 Low back pain: Secondary | ICD-10-CM | POA: Diagnosis not present

## 2018-08-09 DIAGNOSIS — M542 Cervicalgia: Secondary | ICD-10-CM | POA: Diagnosis not present

## 2018-09-12 DIAGNOSIS — Z79899 Other long term (current) drug therapy: Secondary | ICD-10-CM | POA: Diagnosis not present

## 2018-09-12 DIAGNOSIS — Z9189 Other specified personal risk factors, not elsewhere classified: Secondary | ICD-10-CM | POA: Diagnosis not present

## 2018-09-12 DIAGNOSIS — M25511 Pain in right shoulder: Secondary | ICD-10-CM | POA: Diagnosis not present

## 2018-09-19 DIAGNOSIS — R69 Illness, unspecified: Secondary | ICD-10-CM | POA: Diagnosis not present

## 2018-09-20 DIAGNOSIS — R69 Illness, unspecified: Secondary | ICD-10-CM | POA: Diagnosis not present

## 2018-09-27 DIAGNOSIS — R69 Illness, unspecified: Secondary | ICD-10-CM | POA: Diagnosis not present

## 2018-10-13 DIAGNOSIS — M25511 Pain in right shoulder: Secondary | ICD-10-CM | POA: Diagnosis not present

## 2018-10-13 DIAGNOSIS — M545 Low back pain: Secondary | ICD-10-CM | POA: Diagnosis not present

## 2018-10-13 DIAGNOSIS — Z9189 Other specified personal risk factors, not elsewhere classified: Secondary | ICD-10-CM | POA: Diagnosis not present

## 2018-10-13 DIAGNOSIS — J449 Chronic obstructive pulmonary disease, unspecified: Secondary | ICD-10-CM | POA: Diagnosis not present

## 2018-11-08 DIAGNOSIS — M546 Pain in thoracic spine: Secondary | ICD-10-CM | POA: Diagnosis not present

## 2018-11-08 DIAGNOSIS — Z79899 Other long term (current) drug therapy: Secondary | ICD-10-CM | POA: Diagnosis not present

## 2018-11-23 DIAGNOSIS — R5383 Other fatigue: Secondary | ICD-10-CM | POA: Diagnosis not present

## 2018-11-23 DIAGNOSIS — E7800 Pure hypercholesterolemia, unspecified: Secondary | ICD-10-CM | POA: Diagnosis not present

## 2018-11-23 DIAGNOSIS — Z1339 Encounter for screening examination for other mental health and behavioral disorders: Secondary | ICD-10-CM | POA: Diagnosis not present

## 2018-11-23 DIAGNOSIS — Z79899 Other long term (current) drug therapy: Secondary | ICD-10-CM | POA: Diagnosis not present

## 2018-11-23 DIAGNOSIS — Z1159 Encounter for screening for other viral diseases: Secondary | ICD-10-CM | POA: Diagnosis not present

## 2018-11-23 DIAGNOSIS — Z131 Encounter for screening for diabetes mellitus: Secondary | ICD-10-CM | POA: Diagnosis not present

## 2018-12-07 DIAGNOSIS — F900 Attention-deficit hyperactivity disorder, predominantly inattentive type: Secondary | ICD-10-CM | POA: Diagnosis not present

## 2018-12-07 DIAGNOSIS — R69 Illness, unspecified: Secondary | ICD-10-CM | POA: Diagnosis not present

## 2018-12-08 DIAGNOSIS — M546 Pain in thoracic spine: Secondary | ICD-10-CM | POA: Diagnosis not present

## 2018-12-08 DIAGNOSIS — Z9189 Other specified personal risk factors, not elsewhere classified: Secondary | ICD-10-CM | POA: Diagnosis not present

## 2018-12-22 DIAGNOSIS — Z20828 Contact with and (suspected) exposure to other viral communicable diseases: Secondary | ICD-10-CM | POA: Diagnosis not present

## 2019-01-08 DIAGNOSIS — M129 Arthropathy, unspecified: Secondary | ICD-10-CM | POA: Diagnosis not present

## 2019-01-08 DIAGNOSIS — M79641 Pain in right hand: Secondary | ICD-10-CM | POA: Diagnosis not present

## 2019-01-08 DIAGNOSIS — M545 Low back pain: Secondary | ICD-10-CM | POA: Diagnosis not present

## 2019-01-08 DIAGNOSIS — M79642 Pain in left hand: Secondary | ICD-10-CM | POA: Diagnosis not present

## 2019-01-08 DIAGNOSIS — Z1159 Encounter for screening for other viral diseases: Secondary | ICD-10-CM | POA: Diagnosis not present

## 2019-01-08 DIAGNOSIS — Z79899 Other long term (current) drug therapy: Secondary | ICD-10-CM | POA: Diagnosis not present

## 2019-01-08 DIAGNOSIS — M546 Pain in thoracic spine: Secondary | ICD-10-CM | POA: Diagnosis not present

## 2019-01-25 DIAGNOSIS — Z1339 Encounter for screening examination for other mental health and behavioral disorders: Secondary | ICD-10-CM | POA: Diagnosis not present

## 2019-01-25 DIAGNOSIS — E7800 Pure hypercholesterolemia, unspecified: Secondary | ICD-10-CM | POA: Diagnosis not present

## 2019-01-25 DIAGNOSIS — M129 Arthropathy, unspecified: Secondary | ICD-10-CM | POA: Diagnosis not present

## 2019-01-25 DIAGNOSIS — R69 Illness, unspecified: Secondary | ICD-10-CM | POA: Diagnosis not present

## 2019-01-25 DIAGNOSIS — R5383 Other fatigue: Secondary | ICD-10-CM | POA: Diagnosis not present

## 2019-01-25 DIAGNOSIS — Z0000 Encounter for general adult medical examination without abnormal findings: Secondary | ICD-10-CM | POA: Diagnosis not present

## 2019-01-25 DIAGNOSIS — R0602 Shortness of breath: Secondary | ICD-10-CM | POA: Diagnosis not present

## 2019-01-25 DIAGNOSIS — Z131 Encounter for screening for diabetes mellitus: Secondary | ICD-10-CM | POA: Diagnosis not present

## 2019-01-25 DIAGNOSIS — Z1159 Encounter for screening for other viral diseases: Secondary | ICD-10-CM | POA: Diagnosis not present

## 2019-01-25 DIAGNOSIS — E559 Vitamin D deficiency, unspecified: Secondary | ICD-10-CM | POA: Diagnosis not present

## 2019-01-25 DIAGNOSIS — Z1331 Encounter for screening for depression: Secondary | ICD-10-CM | POA: Diagnosis not present

## 2019-01-25 DIAGNOSIS — Z79899 Other long term (current) drug therapy: Secondary | ICD-10-CM | POA: Diagnosis not present

## 2019-01-25 DIAGNOSIS — Z114 Encounter for screening for human immunodeficiency virus [HIV]: Secondary | ICD-10-CM | POA: Diagnosis not present

## 2019-02-13 DIAGNOSIS — J441 Chronic obstructive pulmonary disease with (acute) exacerbation: Secondary | ICD-10-CM | POA: Diagnosis not present

## 2019-02-13 DIAGNOSIS — M546 Pain in thoracic spine: Secondary | ICD-10-CM | POA: Diagnosis not present

## 2019-02-13 DIAGNOSIS — Z79899 Other long term (current) drug therapy: Secondary | ICD-10-CM | POA: Diagnosis not present

## 2019-02-18 ENCOUNTER — Emergency Department (HOSPITAL_COMMUNITY): Payer: Medicare HMO

## 2019-02-18 ENCOUNTER — Encounter (HOSPITAL_COMMUNITY): Payer: Self-pay | Admitting: Emergency Medicine

## 2019-02-18 ENCOUNTER — Inpatient Hospital Stay (HOSPITAL_COMMUNITY)
Admission: EM | Admit: 2019-02-18 | Discharge: 2019-04-10 | DRG: 208 | Disposition: E | Payer: Medicare HMO | Attending: Critical Care Medicine | Admitting: Critical Care Medicine

## 2019-02-18 ENCOUNTER — Other Ambulatory Visit: Payer: Self-pay

## 2019-02-18 DIAGNOSIS — I5033 Acute on chronic diastolic (congestive) heart failure: Secondary | ICD-10-CM | POA: Diagnosis present

## 2019-02-18 DIAGNOSIS — Z4682 Encounter for fitting and adjustment of non-vascular catheter: Secondary | ICD-10-CM | POA: Diagnosis not present

## 2019-02-18 DIAGNOSIS — E873 Alkalosis: Secondary | ICD-10-CM | POA: Diagnosis not present

## 2019-02-18 DIAGNOSIS — M25511 Pain in right shoulder: Secondary | ICD-10-CM | POA: Diagnosis not present

## 2019-02-18 DIAGNOSIS — I2510 Atherosclerotic heart disease of native coronary artery without angina pectoris: Secondary | ICD-10-CM | POA: Diagnosis not present

## 2019-02-18 DIAGNOSIS — I340 Nonrheumatic mitral (valve) insufficiency: Secondary | ICD-10-CM | POA: Diagnosis present

## 2019-02-18 DIAGNOSIS — I2781 Cor pulmonale (chronic): Secondary | ICD-10-CM | POA: Diagnosis not present

## 2019-02-18 DIAGNOSIS — F329 Major depressive disorder, single episode, unspecified: Secondary | ICD-10-CM | POA: Diagnosis present

## 2019-02-18 DIAGNOSIS — I5030 Unspecified diastolic (congestive) heart failure: Secondary | ICD-10-CM | POA: Diagnosis not present

## 2019-02-18 DIAGNOSIS — Z789 Other specified health status: Secondary | ICD-10-CM | POA: Diagnosis not present

## 2019-02-18 DIAGNOSIS — J9382 Other air leak: Secondary | ICD-10-CM | POA: Diagnosis not present

## 2019-02-18 DIAGNOSIS — D62 Acute posthemorrhagic anemia: Secondary | ICD-10-CM | POA: Diagnosis not present

## 2019-02-18 DIAGNOSIS — G8929 Other chronic pain: Secondary | ICD-10-CM | POA: Diagnosis not present

## 2019-02-18 DIAGNOSIS — I509 Heart failure, unspecified: Secondary | ICD-10-CM | POA: Diagnosis not present

## 2019-02-18 DIAGNOSIS — Z791 Long term (current) use of non-steroidal anti-inflammatories (NSAID): Secondary | ICD-10-CM | POA: Diagnosis not present

## 2019-02-18 DIAGNOSIS — I272 Pulmonary hypertension, unspecified: Secondary | ICD-10-CM | POA: Diagnosis not present

## 2019-02-18 DIAGNOSIS — J9690 Respiratory failure, unspecified, unspecified whether with hypoxia or hypercapnia: Secondary | ICD-10-CM | POA: Diagnosis not present

## 2019-02-18 DIAGNOSIS — Z515 Encounter for palliative care: Secondary | ICD-10-CM

## 2019-02-18 DIAGNOSIS — R579 Shock, unspecified: Secondary | ICD-10-CM | POA: Diagnosis not present

## 2019-02-18 DIAGNOSIS — K219 Gastro-esophageal reflux disease without esophagitis: Secondary | ICD-10-CM | POA: Diagnosis present

## 2019-02-18 DIAGNOSIS — F339 Major depressive disorder, recurrent, unspecified: Secondary | ICD-10-CM | POA: Diagnosis not present

## 2019-02-18 DIAGNOSIS — J441 Chronic obstructive pulmonary disease with (acute) exacerbation: Secondary | ICD-10-CM | POA: Diagnosis not present

## 2019-02-18 DIAGNOSIS — Z539 Procedure and treatment not carried out, unspecified reason: Secondary | ICD-10-CM | POA: Diagnosis not present

## 2019-02-18 DIAGNOSIS — Z9889 Other specified postprocedural states: Secondary | ICD-10-CM | POA: Diagnosis not present

## 2019-02-18 DIAGNOSIS — I10 Essential (primary) hypertension: Secondary | ICD-10-CM | POA: Diagnosis not present

## 2019-02-18 DIAGNOSIS — J439 Emphysema, unspecified: Secondary | ICD-10-CM | POA: Diagnosis not present

## 2019-02-18 DIAGNOSIS — R079 Chest pain, unspecified: Secondary | ICD-10-CM | POA: Diagnosis not present

## 2019-02-18 DIAGNOSIS — J9621 Acute and chronic respiratory failure with hypoxia: Secondary | ICD-10-CM | POA: Diagnosis not present

## 2019-02-18 DIAGNOSIS — Z79890 Hormone replacement therapy (postmenopausal): Secondary | ICD-10-CM | POA: Diagnosis not present

## 2019-02-18 DIAGNOSIS — I50810 Right heart failure, unspecified: Secondary | ICD-10-CM | POA: Diagnosis not present

## 2019-02-18 DIAGNOSIS — J939 Pneumothorax, unspecified: Secondary | ICD-10-CM | POA: Diagnosis not present

## 2019-02-18 DIAGNOSIS — R0902 Hypoxemia: Secondary | ICD-10-CM

## 2019-02-18 DIAGNOSIS — D751 Secondary polycythemia: Secondary | ICD-10-CM | POA: Diagnosis present

## 2019-02-18 DIAGNOSIS — R0602 Shortness of breath: Secondary | ICD-10-CM

## 2019-02-18 DIAGNOSIS — I110 Hypertensive heart disease with heart failure: Secondary | ICD-10-CM | POA: Diagnosis not present

## 2019-02-18 DIAGNOSIS — E874 Mixed disorder of acid-base balance: Secondary | ICD-10-CM | POA: Diagnosis not present

## 2019-02-18 DIAGNOSIS — R062 Wheezing: Secondary | ICD-10-CM | POA: Diagnosis not present

## 2019-02-18 DIAGNOSIS — E7800 Pure hypercholesterolemia, unspecified: Secondary | ICD-10-CM | POA: Diagnosis present

## 2019-02-18 DIAGNOSIS — J189 Pneumonia, unspecified organism: Secondary | ICD-10-CM | POA: Diagnosis not present

## 2019-02-18 DIAGNOSIS — E43 Unspecified severe protein-calorie malnutrition: Secondary | ICD-10-CM | POA: Insufficient documentation

## 2019-02-18 DIAGNOSIS — J84112 Idiopathic pulmonary fibrosis: Secondary | ICD-10-CM | POA: Diagnosis not present

## 2019-02-18 DIAGNOSIS — I2609 Other pulmonary embolism with acute cor pulmonale: Secondary | ICD-10-CM | POA: Diagnosis not present

## 2019-02-18 DIAGNOSIS — J8410 Pulmonary fibrosis, unspecified: Secondary | ICD-10-CM | POA: Diagnosis not present

## 2019-02-18 DIAGNOSIS — R0609 Other forms of dyspnea: Secondary | ICD-10-CM | POA: Diagnosis not present

## 2019-02-18 DIAGNOSIS — D649 Anemia, unspecified: Secondary | ICD-10-CM | POA: Diagnosis not present

## 2019-02-18 DIAGNOSIS — K922 Gastrointestinal hemorrhage, unspecified: Secondary | ICD-10-CM | POA: Diagnosis not present

## 2019-02-18 DIAGNOSIS — Z978 Presence of other specified devices: Secondary | ICD-10-CM | POA: Diagnosis not present

## 2019-02-18 DIAGNOSIS — J9601 Acute respiratory failure with hypoxia: Secondary | ICD-10-CM | POA: Diagnosis present

## 2019-02-18 DIAGNOSIS — M797 Fibromyalgia: Secondary | ICD-10-CM | POA: Diagnosis present

## 2019-02-18 DIAGNOSIS — Z8249 Family history of ischemic heart disease and other diseases of the circulatory system: Secondary | ICD-10-CM

## 2019-02-18 DIAGNOSIS — Z9861 Coronary angioplasty status: Secondary | ICD-10-CM | POA: Diagnosis not present

## 2019-02-18 DIAGNOSIS — E785 Hyperlipidemia, unspecified: Secondary | ICD-10-CM | POA: Diagnosis present

## 2019-02-18 DIAGNOSIS — J9622 Acute and chronic respiratory failure with hypercapnia: Secondary | ICD-10-CM | POA: Diagnosis present

## 2019-02-18 DIAGNOSIS — Z20828 Contact with and (suspected) exposure to other viral communicable diseases: Secondary | ICD-10-CM | POA: Diagnosis not present

## 2019-02-18 DIAGNOSIS — R0600 Dyspnea, unspecified: Secondary | ICD-10-CM | POA: Diagnosis not present

## 2019-02-18 DIAGNOSIS — R69 Illness, unspecified: Secondary | ICD-10-CM | POA: Diagnosis not present

## 2019-02-18 DIAGNOSIS — Z7189 Other specified counseling: Secondary | ICD-10-CM

## 2019-02-18 DIAGNOSIS — R069 Unspecified abnormalities of breathing: Secondary | ICD-10-CM | POA: Diagnosis not present

## 2019-02-18 DIAGNOSIS — R64 Cachexia: Secondary | ICD-10-CM | POA: Diagnosis not present

## 2019-02-18 DIAGNOSIS — Z79899 Other long term (current) drug therapy: Secondary | ICD-10-CM | POA: Diagnosis not present

## 2019-02-18 DIAGNOSIS — J9312 Secondary spontaneous pneumothorax: Secondary | ICD-10-CM | POA: Diagnosis not present

## 2019-02-18 DIAGNOSIS — F419 Anxiety disorder, unspecified: Secondary | ICD-10-CM | POA: Diagnosis not present

## 2019-02-18 DIAGNOSIS — I469 Cardiac arrest, cause unspecified: Secondary | ICD-10-CM | POA: Diagnosis not present

## 2019-02-18 DIAGNOSIS — R042 Hemoptysis: Secondary | ICD-10-CM | POA: Diagnosis not present

## 2019-02-18 DIAGNOSIS — Z87891 Personal history of nicotine dependence: Secondary | ICD-10-CM

## 2019-02-18 DIAGNOSIS — M549 Dorsalgia, unspecified: Secondary | ICD-10-CM | POA: Diagnosis present

## 2019-02-18 DIAGNOSIS — R451 Restlessness and agitation: Secondary | ICD-10-CM | POA: Diagnosis not present

## 2019-02-18 DIAGNOSIS — J449 Chronic obstructive pulmonary disease, unspecified: Secondary | ICD-10-CM | POA: Diagnosis not present

## 2019-02-18 DIAGNOSIS — J982 Interstitial emphysema: Secondary | ICD-10-CM | POA: Diagnosis not present

## 2019-02-18 DIAGNOSIS — R799 Abnormal finding of blood chemistry, unspecified: Secondary | ICD-10-CM | POA: Diagnosis not present

## 2019-02-18 DIAGNOSIS — A419 Sepsis, unspecified organism: Secondary | ICD-10-CM | POA: Diagnosis not present

## 2019-02-18 DIAGNOSIS — I2721 Secondary pulmonary arterial hypertension: Secondary | ICD-10-CM | POA: Diagnosis present

## 2019-02-18 DIAGNOSIS — M069 Rheumatoid arthritis, unspecified: Secondary | ICD-10-CM | POA: Diagnosis present

## 2019-02-18 DIAGNOSIS — I2729 Other secondary pulmonary hypertension: Secondary | ICD-10-CM | POA: Diagnosis not present

## 2019-02-18 DIAGNOSIS — E876 Hypokalemia: Secondary | ICD-10-CM | POA: Diagnosis present

## 2019-02-18 DIAGNOSIS — M109 Gout, unspecified: Secondary | ICD-10-CM | POA: Diagnosis present

## 2019-02-18 DIAGNOSIS — R531 Weakness: Secondary | ICD-10-CM

## 2019-02-18 DIAGNOSIS — R0789 Other chest pain: Secondary | ICD-10-CM | POA: Diagnosis not present

## 2019-02-18 DIAGNOSIS — M25512 Pain in left shoulder: Secondary | ICD-10-CM | POA: Diagnosis not present

## 2019-02-18 DIAGNOSIS — I959 Hypotension, unspecified: Secondary | ICD-10-CM | POA: Diagnosis not present

## 2019-02-18 DIAGNOSIS — F909 Attention-deficit hyperactivity disorder, unspecified type: Secondary | ICD-10-CM | POA: Diagnosis present

## 2019-02-18 DIAGNOSIS — J849 Interstitial pulmonary disease, unspecified: Secondary | ICD-10-CM

## 2019-02-18 DIAGNOSIS — R000 Tachycardia, unspecified: Secondary | ICD-10-CM | POA: Diagnosis not present

## 2019-02-18 DIAGNOSIS — T380X5A Adverse effect of glucocorticoids and synthetic analogues, initial encounter: Secondary | ICD-10-CM | POA: Diagnosis present

## 2019-02-18 DIAGNOSIS — G4733 Obstructive sleep apnea (adult) (pediatric): Secondary | ICD-10-CM | POA: Diagnosis present

## 2019-02-18 DIAGNOSIS — I361 Nonrheumatic tricuspid (valve) insufficiency: Secondary | ICD-10-CM | POA: Diagnosis not present

## 2019-02-18 DIAGNOSIS — R918 Other nonspecific abnormal finding of lung field: Secondary | ICD-10-CM

## 2019-02-18 DIAGNOSIS — R0603 Acute respiratory distress: Secondary | ICD-10-CM | POA: Diagnosis not present

## 2019-02-18 DIAGNOSIS — Z79891 Long term (current) use of opiate analgesic: Secondary | ICD-10-CM | POA: Diagnosis not present

## 2019-02-18 DIAGNOSIS — Z6821 Body mass index (BMI) 21.0-21.9, adult: Secondary | ICD-10-CM

## 2019-02-18 DIAGNOSIS — M0510 Rheumatoid lung disease with rheumatoid arthritis of unspecified site: Secondary | ICD-10-CM | POA: Diagnosis not present

## 2019-02-18 DIAGNOSIS — N200 Calculus of kidney: Secondary | ICD-10-CM | POA: Diagnosis not present

## 2019-02-18 DIAGNOSIS — Z9689 Presence of other specified functional implants: Secondary | ICD-10-CM

## 2019-02-18 HISTORY — DX: Attention-deficit hyperactivity disorder, unspecified type: F90.9

## 2019-02-18 HISTORY — DX: Chronic obstructive pulmonary disease, unspecified: J44.9

## 2019-02-18 HISTORY — DX: Heart failure, unspecified: I50.9

## 2019-02-18 HISTORY — DX: Sleep apnea, unspecified: G47.30

## 2019-02-18 HISTORY — DX: Pure hypercholesterolemia, unspecified: E78.00

## 2019-02-18 HISTORY — DX: Gastro-esophageal reflux disease without esophagitis: K21.9

## 2019-02-18 HISTORY — DX: Chronic obstructive pulmonary disease with (acute) exacerbation: J44.1

## 2019-02-18 HISTORY — DX: Other chronic pain: G89.29

## 2019-02-18 LAB — CBC WITH DIFFERENTIAL/PLATELET
Abs Immature Granulocytes: 0.29 10*3/uL — ABNORMAL HIGH (ref 0.00–0.07)
Basophils Absolute: 0.1 10*3/uL (ref 0.0–0.1)
Basophils Relative: 0 %
Eosinophils Absolute: 0.1 10*3/uL (ref 0.0–0.5)
Eosinophils Relative: 0 %
HCT: 44.3 % (ref 36.0–46.0)
Hemoglobin: 14.4 g/dL (ref 12.0–15.0)
Immature Granulocytes: 2 %
Lymphocytes Relative: 14 %
Lymphs Abs: 2.6 10*3/uL (ref 0.7–4.0)
MCH: 31.1 pg (ref 26.0–34.0)
MCHC: 32.5 g/dL (ref 30.0–36.0)
MCV: 95.7 fL (ref 80.0–100.0)
Monocytes Absolute: 1.7 10*3/uL — ABNORMAL HIGH (ref 0.1–1.0)
Monocytes Relative: 9 %
Neutro Abs: 14 10*3/uL — ABNORMAL HIGH (ref 1.7–7.7)
Neutrophils Relative %: 75 %
Platelets: 445 10*3/uL — ABNORMAL HIGH (ref 150–400)
RBC: 4.63 MIL/uL (ref 3.87–5.11)
RDW: 12.7 % (ref 11.5–15.5)
WBC: 18.8 10*3/uL — ABNORMAL HIGH (ref 4.0–10.5)
nRBC: 0 % (ref 0.0–0.2)

## 2019-02-18 LAB — TROPONIN I (HIGH SENSITIVITY)
Troponin I (High Sensitivity): 55 ng/L — ABNORMAL HIGH (ref <18)
Troponin I (High Sensitivity): 61 ng/L — ABNORMAL HIGH (ref <18)

## 2019-02-18 LAB — COMPREHENSIVE METABOLIC PANEL
ALT: 11 U/L (ref 0–44)
AST: 31 U/L (ref 15–41)
Albumin: 3.2 g/dL — ABNORMAL LOW (ref 3.5–5.0)
Alkaline Phosphatase: 110 U/L (ref 38–126)
Anion gap: 16 — ABNORMAL HIGH (ref 5–15)
BUN: 20 mg/dL (ref 8–23)
CO2: 20 mmol/L — ABNORMAL LOW (ref 22–32)
Calcium: 8.9 mg/dL (ref 8.9–10.3)
Chloride: 102 mmol/L (ref 98–111)
Creatinine, Ser: 0.83 mg/dL (ref 0.44–1.00)
GFR calc Af Amer: >60 mL/min (ref >60)
GFR calc non Af Amer: >60 mL/min (ref >60)
Glucose, Bld: 186 mg/dL — ABNORMAL HIGH (ref 70–99)
Potassium: 3.4 mmol/L — ABNORMAL LOW (ref 3.5–5.1)
Sodium: 138 mmol/L (ref 135–145)
Total Bilirubin: 1.4 mg/dL — ABNORMAL HIGH (ref 0.3–1.2)
Total Protein: 6.7 g/dL (ref 6.5–8.1)

## 2019-02-18 LAB — CBC
HCT: 46.4 % — ABNORMAL HIGH (ref 36.0–46.0)
Hemoglobin: 15.8 g/dL — ABNORMAL HIGH (ref 12.0–15.0)
MCH: 32 pg (ref 26.0–34.0)
MCHC: 34.1 g/dL (ref 30.0–36.0)
MCV: 93.9 fL (ref 80.0–100.0)
Platelets: 286 10*3/uL (ref 150–400)
RBC: 4.94 MIL/uL (ref 3.87–5.11)
RDW: 12.9 % (ref 11.5–15.5)
WBC: 13.8 10*3/uL — ABNORMAL HIGH (ref 4.0–10.5)
nRBC: 0 % (ref 0.0–0.2)

## 2019-02-18 LAB — BRAIN NATRIURETIC PEPTIDE: B Natriuretic Peptide: 1175.2 pg/mL — ABNORMAL HIGH (ref 0.0–100.0)

## 2019-02-18 LAB — POCT I-STAT 7, (LYTES, BLD GAS, ICA,H+H)
Acid-base deficit: 5 mmol/L — ABNORMAL HIGH (ref 0.0–2.0)
Bicarbonate: 18 mmol/L — ABNORMAL LOW (ref 20.0–28.0)
Calcium, Ion: 1.13 mmol/L — ABNORMAL LOW (ref 1.15–1.40)
HCT: 42 % (ref 36.0–46.0)
Hemoglobin: 14.3 g/dL (ref 12.0–15.0)
O2 Saturation: 91 %
Patient temperature: 97.7
Potassium: 3 mmol/L — ABNORMAL LOW (ref 3.5–5.1)
Sodium: 137 mmol/L (ref 135–145)
TCO2: 19 mmol/L — ABNORMAL LOW (ref 22–32)
pCO2 arterial: 26 mmHg — ABNORMAL LOW (ref 32.0–48.0)
pH, Arterial: 7.446 (ref 7.350–7.450)
pO2, Arterial: 55 mmHg — ABNORMAL LOW (ref 83.0–108.0)

## 2019-02-18 LAB — SARS CORONAVIRUS 2 BY RT PCR (HOSPITAL ORDER, PERFORMED IN ~~LOC~~ HOSPITAL LAB): SARS Coronavirus 2: NEGATIVE

## 2019-02-18 LAB — CREATININE, SERUM
Creatinine, Ser: 0.81 mg/dL (ref 0.44–1.00)
GFR calc Af Amer: >60 mL/min (ref >60)
GFR calc non Af Amer: >60 mL/min (ref >60)

## 2019-02-18 LAB — LACTIC ACID, PLASMA: Lactic Acid, Venous: 4.5 mmol/L (ref 0.5–1.9)

## 2019-02-18 MED ORDER — SODIUM CHLORIDE 0.9 % IV SOLN
500.0000 mg | INTRAVENOUS | Status: DC
  Administered 2019-02-18 – 2019-02-19 (×2): 500 mg via INTRAVENOUS
  Filled 2019-02-18 (×2): qty 500

## 2019-02-18 MED ORDER — SENNOSIDES-DOCUSATE SODIUM 8.6-50 MG PO TABS: 1.0000 | ORAL_TABLET | Freq: Every evening | ORAL | Status: DC | PRN

## 2019-02-18 MED ORDER — ALBUTEROL SULFATE (2.5 MG/3ML) 0.083% IN NEBU
2.5000 mg | INHALATION_SOLUTION | Freq: Four times a day (QID) | RESPIRATORY_TRACT | Status: DC | PRN
  Administered 2019-02-25 – 2019-03-12 (×2): 2.5 mg via RESPIRATORY_TRACT
  Filled 2019-02-18 (×2): qty 3

## 2019-02-18 MED ORDER — POTASSIUM CHLORIDE 10 MEQ/100ML IV SOLN
10.0000 meq | INTRAVENOUS | Status: AC
  Administered 2019-02-18 (×4): 10 meq via INTRAVENOUS
  Filled 2019-02-18 (×4): qty 100

## 2019-02-18 MED ORDER — ACETAMINOPHEN 650 MG RE SUPP
650.0000 mg | Freq: Four times a day (QID) | RECTAL | Status: DC | PRN
  Administered 2019-02-19: 23:00:00 650 mg via RECTAL
  Filled 2019-02-18: qty 1

## 2019-02-18 MED ORDER — ALBUTEROL SULFATE HFA 108 (90 BASE) MCG/ACT IN AERS
6.0000 | INHALATION_SPRAY | Freq: Once | RESPIRATORY_TRACT | Status: AC
  Administered 2019-02-18: 6 via RESPIRATORY_TRACT
  Filled 2019-02-18: qty 6.7

## 2019-02-18 MED ORDER — FUROSEMIDE 10 MG/ML IJ SOLN
40.0000 mg | Freq: Once | INTRAMUSCULAR | Status: AC
  Administered 2019-02-18: 40 mg via INTRAVENOUS
  Filled 2019-02-18: qty 4

## 2019-02-18 MED ORDER — SODIUM CHLORIDE 0.9 % IV SOLN
2.0000 g | INTRAVENOUS | Status: DC
  Administered 2019-02-18 – 2019-02-19 (×2): 2 g via INTRAVENOUS
  Filled 2019-02-18: qty 2
  Filled 2019-02-18 (×2): qty 20

## 2019-02-18 MED ORDER — INFLUENZA VAC A&B SA ADJ QUAD 0.5 ML IM PRSY
0.5000 mL | PREFILLED_SYRINGE | INTRAMUSCULAR | Status: DC
  Filled 2019-02-18: qty 0.5

## 2019-02-18 MED ORDER — ENOXAPARIN SODIUM 40 MG/0.4ML ~~LOC~~ SOLN
40.0000 mg | SUBCUTANEOUS | Status: DC
  Administered 2019-02-18 – 2019-02-28 (×11): 40 mg via SUBCUTANEOUS
  Filled 2019-02-18 (×11): qty 0.4

## 2019-02-18 MED ORDER — ACETAMINOPHEN 325 MG PO TABS
650.0000 mg | ORAL_TABLET | Freq: Four times a day (QID) | ORAL | Status: DC | PRN
  Administered 2019-02-24: 08:00:00 650 mg via ORAL
  Filled 2019-02-18 (×2): qty 2

## 2019-02-18 MED ORDER — IPRATROPIUM-ALBUTEROL 0.5-2.5 (3) MG/3ML IN SOLN
3.0000 mL | Freq: Four times a day (QID) | RESPIRATORY_TRACT | Status: DC
  Administered 2019-02-18 – 2019-02-19 (×2): 3 mL via RESPIRATORY_TRACT
  Filled 2019-02-18 (×2): qty 3

## 2019-02-18 NOTE — ED Provider Notes (Addendum)
[facility] EMERGENCY DEPARTMENT Provider Note   CSN: [id number] Arrival date & time: 03/02/2019  1144     History   Chief Complaint Chief Complaint  Patient presents with  . Shortness of Breath    HPI Julia Massey is a 68 y.o. female.     Patient is a 68 year old female with history of COPD presenting with a several day history of wheezing, shortness of breath and progressive difficulty breathing.  Patient was found by EMS to be hypoxic and placed on nonrebreather.  She was given steroids, then transported here.  Patient denies chest pain.  She denies fevers or chills.  She denies productive cough.  Patient intubated several years ago for what she refers to as "double pneumonia".  The history is provided by the patient.  Shortness of Breath Severity:  Moderate Onset quality:  Gradual Duration:  3 days Timing:  Constant Progression:  Worsening Chronicity:  Recurrent Context: activity   Relieved by:  Nothing Worsened by:  Nothing Associated symptoms: no cough and no fever     No past medical history on file.  Patient Active Problem List   Diagnosis Date Noted  . Encephalopathy 05/30/2015  . Atypical pneumonia   . Acute pulmonary edema (HCC)   . Chronic diastolic CHF (congestive heart failure) (HCC)   . Pulmonary hypertension (HCC)   . HLD (hyperlipidemia)   . Hypernatremia   . Anxiety state   . Depression   . Acute encephalopathy   . Acute respiratory failure with hypoxemia (HCC)   . Altered mental status   . COPD exacerbation (HCC)   . Encephalopathy acute   . ARDS (adult respiratory distress syndrome) (HCC)   . Encounter for orogastric (OG) tube placement   . Respiratory failure (HCC)   . Acute respiratory failure with hypoxia (HCC) 05/14/2015  . Malnutrition of moderate degree 05/14/2015    No past surgical history on file.   OB History   No obstetric history on file.      Home Medications    Prior to Admission medications    Medication Sig Start Date End Date Taking? Authorizing Provider  cetirizine (ZYRTEC) 10 MG tablet Take 10 mg by mouth daily as needed for allergies. Reported on 06/18/2015    [provider]  Cyanocobalamin ([employer]S B-12) 1000 MCG/15ML LIQD Take 1,000 mcg by mouth daily.     [provider]  FLUoxetine (PROZAC) 40 MG capsule Take 1 capsule (40 mg total) by mouth daily. 06/12/15   Angiulli, Mcarthur Rossetti, PA-C  folic acid (FOLVITE) 1 MG tablet Take 1 tablet (1 mg total) by mouth daily. 06/12/15   Angiulli, Mcarthur Rossetti, PA-C  Multiple Vitamin (MULTIVITAMIN) tablet Take 1 tablet by mouth daily.    [provider]  polyethylene glycol (MIRALAX / GLYCOLAX) packet Take 17 g by mouth daily. Patient not taking: Reported on 06/18/2015 06/12/15   Angiulli, Mcarthur Rossetti, PA-C    Family History No family history on file.  Social History Social History   Tobacco Use  . Smoking status: Former Smoker    Packs/day: 0.25    Years: 15.00    Pack years: 3.75    Types: Cigarettes    Quit date: 04/10/2015    Years since quitting: 3.8  Substance Use Topics  . Alcohol use: Yes    Alcohol/week: 4.0 standard drinks    Types: 2 Glasses of wine, 2 Cans of beer per week  . Drug use: No     Allergies  Patient has no allergy information on record.   Review of Systems Review of Systems  Constitutional: Negative for fever.  Respiratory: Positive for shortness of breath. Negative for cough.   All other systems reviewed and are negative.    Physical Exam Updated Vital Signs There were no vitals taken for this visit.  Physical Exam Vitals signs and nursing note reviewed.  Constitutional:      General: She is not in acute distress.    Appearance: She is well-developed. She is not diaphoretic.  HENT:     Head: Normocephalic and atraumatic.  Neck:     Musculoskeletal: Normal range of motion and neck supple.  Cardiovascular:     Rate and Rhythm: Normal rate and regular rhythm.     Heart  sounds: No murmur. No friction rub. No gallop.   Pulmonary:     Effort: Tachypnea, accessory muscle usage and respiratory distress present.     Breath sounds: Examination of the right-middle field reveals rhonchi. Examination of the left-middle field reveals rhonchi. Rhonchi present.     Comments: There are expiratory rhonchi bilaterally.  Patient is in mild respiratory distress.  She is able to speak in full sentences, but does become somewhat winded. Abdominal:     General: Bowel sounds are normal. There is no distension.     Palpations: Abdomen is soft.     Tenderness: There is no abdominal tenderness.  Musculoskeletal: Normal range of motion.     Right lower leg: She exhibits no tenderness. No edema.     Left lower leg: She exhibits no tenderness. No edema.  Skin:    General: Skin is warm and dry.  Neurological:     Mental Status: She is alert and oriented to person, place, and time.      ED Treatments / Results  Labs (all labs ordered are listed, but only abnormal results are displayed) Labs Reviewed  SARS CORONAVIRUS 2 BY RT PCR (HOSPITAL ORDER, PERFORMED IN [facility])  COMPREHENSIVE METABOLIC PANEL  BRAIN NATRIURETIC PEPTIDE  CBC WITH DIFFERENTIAL/PLATELET  BLOOD GAS, ARTERIAL  TROPONIN I (HIGH SENSITIVITY)    EKG EKG Interpretation  Date/Time:  Sunday February 18 2019 14:52:45 EDT Ventricular Rate:  107 PR Interval:    QRS Duration: 105 QT Interval:  427 QTC Calculation: 570 R Axis:   4 Text Interpretation:  Sinus tachycardia Atrial premature complex Probable left atrial enlargement Low voltage, precordial leads Nonspecific T abnormalities, diffuse leads Prolonged QT interval Confirmed by Geoffery Lyons ([id number]) on 02/09/2019 2:55:34 PM   Radiology No results found.  Procedures Procedures (including critical care time)  Medications Ordered in ED Medications  albuterol (VENTOLIN HFA) 108 (90 Base) MCG/ACT inhaler 6 puff (has no administration in  time range)     Initial Impression / Assessment and Plan / ED Course  I have reviewed the triage vital signs and the nursing notes.  Pertinent labs & imaging results that were available during my care of the patient were reviewed by me and considered in my medical decision making (see chart for details).  Patient presenting with a several day history of worsening breathing and cough.  Patient has history of COPD and prior pneumonia requiring intubation.  This was several years ago.  Patient was found this morning to be hypoxic by EMS and was placed on a nonrebreather.  She was given albuterol and steroids here in the ER, but continues with a significant oxygen requirement.  She has hypoxia on her ABG.  Laboratory studies reveal an elevated white count and elevated BNP, but are otherwise unremarkable.  Blood cultures have been obtained and patient will be given antibiotics to cover community-acquired pneumonia.  She will be admitted to the internal medicine teaching service.  CRITICAL CARE Performed by: Veryl Speak Total critical care time: 35 minutes Critical care time was exclusive of separately billable procedures and treating other patients. Critical care was necessary to treat or prevent imminent or life-threatening deterioration. Critical care was time spent personally by me on the following activities: development of treatment plan with patient and/or surrogate as well as nursing, discussions with consultants, evaluation of patient's response to treatment, examination of patient, obtaining history from patient or surrogate, ordering and performing treatments and interventions, ordering and review of laboratory studies, ordering and review of radiographic studies, pulse oximetry and re-evaluation of patient's condition.   Final Clinical Impressions(s) / ED Diagnoses   Final diagnoses:  None    ED Discharge Orders    None       Veryl Speak, MD 03/03/2019 1453    Veryl Speak, MD 02/22/2019 1456

## 2019-02-18 NOTE — ED Notes (Signed)
Respiratory therapist at bedside.

## 2019-02-18 NOTE — ED Notes (Signed)
Pt placed on bipap  

## 2019-02-18 NOTE — H&P (Signed)
Date: 02/17/2019               Patient Name:  OAKLAND COURTEMANCHE MRN: [id number]  DOB: Apr 15, 1951 Age / Sex: 68 y.o., female   PCP: Courtney Paris, NP         Medical Service: Internal Medicine Teaching Service         Attending Physician: Dr. Burns Spain, MD    First Contact: Dr. Huel Cote Pager: [phone]  Second Contact: Dr. Maryla Morrow Pager: [phone]       After Hours (After 5p/  First Contact Pager: [phone]  weekends / holidays): Second Contact Pager: [phone]   Chief Complaint: Shortness of breathe  History of Present Illness:   Ms. Davinia Laperle is a 68 y/o female with a PMHx of COPD that presents to the ED with c/o shortness of breathe.   Ms. Gehlbach reports that she began feeling myalgias and malaise approximately 2 days ago, in addition to waking up soaking wet and sweat.  She had some leftover levofloxacin, and took 1 dose at the time.  On Saturday, yesterday, she felt okay because she rested most of the day; took an additional dose of levofloxacin.  This morning around 6 AM, she woke up feeling short of breath and took her Norco, Aleve and used her albuterol inhaler.  She was able to fall back to sleep but woke up again around 10 AM with significant worsening in her SOB.  She notes some central chest pain with radiation to her back but no other radiation.  Pain is worsened by palpation.  She also endorses diarrhea for 2 days that is nonbloody.  She denies any fever, chills, cough, runny nose, sore throat, dizziness, headache, nausea, vomiting, abdominal pain.   Patient has had this is similar to her past episode of pneumonia where she required intubation for approximately 1 month, however this episode is not as severe as she came to the hospital earlier.  ED course: On arrival, patient was hypoxic and tachypneic.  Troponins were elevated at 55 and trended upwards to 61.  Leukocytosis of 18.8 with neutrophil dominance.  She was given approximately 6 duonebs and started on  antibiotics and steroids.  She became hypoxic on nasal cannula and had to be placed on nonrebreather at 15 L.  COVID was negative.  ABG showed hypoxia with O2 in the 50s.  Meds:  Current Meds  Medication Sig  . amphetamine-dextroamphetamine (ADDERALL) 30 MG tablet Take 30 mg by mouth daily.  . cetirizine (ZYRTEC) 10 MG tablet Take 10 mg by mouth daily as needed for allergies. Reported on 06/18/2015  . Cyanocobalamin (CVS B-12) 1000 MCG/15ML LIQD Take 1,000 mcg by mouth daily.   Marland Kitchen FLUoxetine (PROZAC) 40 MG capsule Take 1 capsule (40 mg total) by mouth daily.  . folic acid (FOLVITE) 1 MG tablet Take 1 tablet (1 mg total) by mouth daily.  Marland Kitchen HYDROcodone-acetaminophen (NORCO) 10-325 MG tablet Take 1 tablet by mouth every 6 (six) hours as needed for moderate pain.  Marland Kitchen ibuprofen (ADVIL) 200 MG tablet Take 400 mg by mouth every 6 (six) hours as needed for moderate pain.  Marland Kitchen levofloxacin (LEVAQUIN) 500 MG tablet Take 500 mg by mouth daily.  . naproxen sodium (ALEVE) 220 MG tablet Take 220 mg by mouth as needed (pain).   Allergies: Allergies as of 02/20/2019  . (No Known Allergies)   Past Medical History:  Diagnosis Date  . COPD (chronic obstructive pulmonary disease) (HCC)    -  Gout  - Hyperlipidemia - ? CHF - Chronic back pain - Carpal tunnel   Family History: Mother: Heart Failure Brother: Heart Failure   Social History:  Tobacco Use: Former smoker, quit in 2017, prior to that was approximately 1/2 pack per day  Alcohol Use: Denies  Drug use: Denies   Review of Systems: A complete ROS was negative except as per HPI.   Physical Exam: Blood pressure 140/84, pulse 95, temperature (!) 97.2 F (36.2 C), temperature source Axillary, resp. rate (!) 36, height 5' (1.524 m), weight 51.3 kg, SpO2 98 %.  Physical Exam Vitals signs and nursing note reviewed.  Constitutional:      General: She is in acute distress (mild).     Appearance: She is normal weight. She is not diaphoretic.      Interventions: She is not intubated. HENT:     Head: Normocephalic and atraumatic.  Cardiovascular:     Rate and Rhythm: Regular rhythm. Tachycardia present.     Heart sounds: No murmur. No gallop.   Pulmonary:     Effort: Tachypnea and accessory muscle usage present. She is not intubated.     Breath sounds: Examination of the right-middle field reveals rales. Examination of the left-middle field reveals rales. Examination of the right-lower field reveals rales. Examination of the left-lower field reveals rales. Rales present. No decreased breath sounds, wheezing or rhonchi.  Abdominal:     Palpations: Abdomen is soft.     Tenderness: There is no abdominal tenderness. There is no guarding.  Musculoskeletal:     Right lower leg: She exhibits no tenderness. No edema.     Left lower leg: She exhibits no tenderness. No edema.  Skin:    General: Skin is warm and dry.  Neurological:     General: No focal deficit present.     Mental Status: She is alert and oriented to person, place, and time.  Psychiatric:        Mood and Affect: Mood normal.        Behavior: Behavior normal.    EKG: personally reviewed my interpretation is: Rate ~100 bpm. Sinus rhythm. No ST elevation or depression. QT prolongation the precordial leads.   CXR: personally reviewed my interpretation is: Bilateral costodiaphragmatic angles blunting, likely bilateral pleural effusions. Left sided heart contour obscured by possible infiltrate.   Assessment & Plan by Problem: Active Problems:   COPD exacerbation (HCC)  # Acute hypoxic respiratory failure  # Community acquired pneumonia # COPD exacerbation  Patient presents with 1 day history of significant SOB after myalgia and malaise for 2 days. She is afebrile, however tachycardic, tachypnea and O2 saturation drops if NRB is decreased below 10 liters. Her ABG shows low O2 and CO2. Leukocytosis with left shift and chest Xray supports CAP. Received multiple Ventolin puffs  with some relief. Ceftriaxone and Azithromycin were started in the ED. Blood cultures pending.  Concern that she will have respiratory decline overnight, so rapid response was made aware of her case.   - PCCM/RR made aware of case  - Telemetry - NRB at 15 L  - Bipap PRN - Repeat ABG - Lactic Acid pending  - Duoneb q6h  - Ceftriaxone 2g q24h  - Azithromycin 500mg  q24h   # Heart Failure  Patient notes being told in the past that she has CHF. No further follow up. Last echo in 2017 showed grade 1 diastolic dysfunction with preserved EF at 55-60%. BNP on this admission was 1170. No prior BNP  on chart. Examination was positive for bilateral crackles although no lower extremity edema. Possible she has decompensated and it is contributing to her respiratory failure. Chest xray positive for possible pleural effusions. Will try IV lasix. Consider TTE when more stable.   - Telemetry - Strict I/Os  - Daily weights - Lasix 40mg  IV once   # Hypokalemia Initial potassium was 3.4 and repeat came back lower at 3.0. Plan to replenish  - BMP tomorrow - KCl 40 mEq IV    Dispo: Admit patient to Inpatient with expected length of stay greater than 2 midnights.  Signed: Dr. Jose Persia Internal Medicine PGY-1  Pager: [phone] 02/17/2019, 8:53 PM

## 2019-02-18 NOTE — ED Triage Notes (Signed)
Patient arrived from home via EMs with complaints fo increased shortness of breath X 3 days and chest tightness. EMS provided Solumedrol, albuterol and epi

## 2019-02-18 NOTE — ED Notes (Signed)
Patient transported to CT 

## 2019-02-18 NOTE — Progress Notes (Signed)
On assessment patient still remains tachypneic. Patient is alert and oriented. BBS to auscultation reveal Coarse aeration, good air movement noted. No wheezing noted. Patient does have hx of COPD presenting w/ SOB. Patient WBC's are elevated no fever noted per charting. Per patient recent Chest film reveals "Chronic interstitial lung disease with new patchy opacities in the right lung favored to represent superimposed infection" per MD Dimas Aguas. Patient is clinically stable at this time maintain her SATs at 96% on 60% on NIV.   RRT Goal for the patient is to maintain good ventilation and oxygenation via utilization of NIV w/ schedule bronchodilator medication to meet systemic and myocardial oxygen demands to assure adequate tissue perfusion.   Carreen Milius L. Jennette Kettle, RRT, RCP 02/17/2019, 2105

## 2019-02-18 NOTE — ED Notes (Signed)
Difficulty obtaining oral temperature at this time.

## 2019-02-18 NOTE — Progress Notes (Signed)
Patient MEWS was a 3 in the ED. There has not been an acute change patient is still a 3.

## 2019-02-18 NOTE — Progress Notes (Signed)
Patient transported to 1G33 w/o complications. Uneventful trip. Report given to unit RRT.

## 2019-02-18 NOTE — ED Notes (Signed)
Called RT to help with transport 

## 2019-02-18 NOTE — ED Notes (Signed)
Report attempted 

## 2019-02-19 ENCOUNTER — Other Ambulatory Visit: Payer: Self-pay

## 2019-02-19 ENCOUNTER — Inpatient Hospital Stay (HOSPITAL_COMMUNITY): Payer: Medicare HMO

## 2019-02-19 ENCOUNTER — Encounter (HOSPITAL_COMMUNITY): Payer: Self-pay | Admitting: Internal Medicine

## 2019-02-19 DIAGNOSIS — J9601 Acute respiratory failure with hypoxia: Secondary | ICD-10-CM | POA: Diagnosis not present

## 2019-02-19 DIAGNOSIS — I361 Nonrheumatic tricuspid (valve) insufficiency: Secondary | ICD-10-CM | POA: Diagnosis not present

## 2019-02-19 DIAGNOSIS — E873 Alkalosis: Secondary | ICD-10-CM | POA: Diagnosis not present

## 2019-02-19 DIAGNOSIS — I5030 Unspecified diastolic (congestive) heart failure: Secondary | ICD-10-CM | POA: Diagnosis not present

## 2019-02-19 LAB — COMPREHENSIVE METABOLIC PANEL
ALT: 11 U/L (ref 0–44)
AST: 28 U/L (ref 15–41)
Albumin: 3.2 g/dL — ABNORMAL LOW (ref 3.5–5.0)
Alkaline Phosphatase: 111 U/L (ref 38–126)
Anion gap: 15 (ref 5–15)
BUN: 18 mg/dL (ref 8–23)
CO2: 20 mmol/L — ABNORMAL LOW (ref 22–32)
Calcium: 8.8 mg/dL — ABNORMAL LOW (ref 8.9–10.3)
Chloride: 106 mmol/L (ref 98–111)
Creatinine, Ser: 0.62 mg/dL (ref 0.44–1.00)
GFR calc Af Amer: >60 mL/min (ref >60)
GFR calc non Af Amer: >60 mL/min (ref >60)
Glucose, Bld: 148 mg/dL — ABNORMAL HIGH (ref 70–99)
Potassium: 4 mmol/L (ref 3.5–5.1)
Sodium: 141 mmol/L (ref 135–145)
Total Bilirubin: 0.6 mg/dL (ref 0.3–1.2)
Total Protein: 7.2 g/dL (ref 6.5–8.1)

## 2019-02-19 LAB — BLOOD GAS, ARTERIAL
Acid-base deficit: 0.4 mmol/L (ref 0.0–2.0)
Bicarbonate: 22.8 mmol/L (ref 20.0–28.0)
Delivery systems: POSITIVE
Drawn by: 36277
Expiratory PAP: 8
FIO2: 0.5
Inspiratory PAP: 12
O2 Saturation: 95.3 %
Patient temperature: 98.3
pCO2 arterial: 31.5 mmHg — ABNORMAL LOW (ref 32.0–48.0)
pH, Arterial: 7.472 — ABNORMAL HIGH (ref 7.350–7.450)
pO2, Arterial: 75.9 mmHg — ABNORMAL LOW (ref 83.0–108.0)

## 2019-02-19 LAB — LACTIC ACID, PLASMA: Lactic Acid, Venous: 2.6 mmol/L (ref 0.5–1.9)

## 2019-02-19 LAB — RESPIRATORY PANEL BY PCR

## 2019-02-19 LAB — CBC WITH DIFFERENTIAL/PLATELET
Abs Immature Granulocytes: 0.11 10*3/uL — ABNORMAL HIGH (ref 0.00–0.07)
Basophils Absolute: 0 10*3/uL (ref 0.0–0.1)
Basophils Relative: 0 %
Eosinophils Absolute: 0 10*3/uL (ref 0.0–0.5)
Eosinophils Relative: 0 %
HCT: 43.7 % (ref 36.0–46.0)
Hemoglobin: 14.9 g/dL (ref 12.0–15.0)
Immature Granulocytes: 1 %
Lymphocytes Relative: 7 %
Lymphs Abs: 1.1 10*3/uL (ref 0.7–4.0)
MCH: 31.4 pg (ref 26.0–34.0)
MCHC: 34.1 g/dL (ref 30.0–36.0)
MCV: 92.2 fL (ref 80.0–100.0)
Monocytes Absolute: 0.9 10*3/uL (ref 0.1–1.0)
Monocytes Relative: 6 %
Neutro Abs: 13.2 10*3/uL — ABNORMAL HIGH (ref 1.7–7.7)
Neutrophils Relative %: 86 %
Platelets: 287 10*3/uL (ref 150–400)
RBC: 4.74 MIL/uL (ref 3.87–5.11)
RDW: 12.9 % (ref 11.5–15.5)
WBC: 15.3 10*3/uL — ABNORMAL HIGH (ref 4.0–10.5)
nRBC: 0 % (ref 0.0–0.2)

## 2019-02-19 LAB — HIV ANTIBODY (ROUTINE TESTING W REFLEX): HIV Screen 4th Generation wRfx: NONREACTIVE

## 2019-02-19 LAB — SEDIMENTATION RATE: Sed Rate: 60 mm/hr — ABNORMAL HIGH (ref 0–22)

## 2019-02-19 LAB — PROCALCITONIN: Procalcitonin: 0.18 ng/mL

## 2019-02-19 LAB — GLUCOSE, CAPILLARY: Glucose-Capillary: 133 mg/dL — ABNORMAL HIGH (ref 70–99)

## 2019-02-19 MED ORDER — IPRATROPIUM-ALBUTEROL 0.5-2.5 (3) MG/3ML IN SOLN
3.0000 mL | Freq: Three times a day (TID) | RESPIRATORY_TRACT | Status: DC
  Administered 2019-02-19 – 2019-03-12 (×62): 3 mL via RESPIRATORY_TRACT
  Filled 2019-02-19 (×63): qty 3

## 2019-02-19 MED ORDER — CHLORHEXIDINE GLUCONATE 0.12 % MT SOLN
15.0000 mL | Freq: Two times a day (BID) | OROMUCOSAL | Status: DC
  Administered 2019-02-19 (×2): 15 mL via OROMUCOSAL
  Filled 2019-02-19 (×2): qty 15

## 2019-02-19 MED ORDER — ORAL CARE MOUTH RINSE
15.0000 mL | Freq: Two times a day (BID) | OROMUCOSAL | Status: DC
  Administered 2019-02-19 – 2019-03-12 (×28): 15 mL via OROMUCOSAL

## 2019-02-19 MED ORDER — METHYLPREDNISOLONE SODIUM SUCC 125 MG IJ SOLR
60.0000 mg | Freq: Four times a day (QID) | INTRAMUSCULAR | Status: DC
  Administered 2019-02-19 – 2019-02-22 (×12): 60 mg via INTRAVENOUS
  Filled 2019-02-19 (×12): qty 2

## 2019-02-19 MED ORDER — FUROSEMIDE 10 MG/ML IJ SOLN
40.0000 mg | Freq: Once | INTRAMUSCULAR | Status: AC
  Administered 2019-02-19: 40 mg via INTRAVENOUS
  Filled 2019-02-19: qty 4

## 2019-02-19 MED ORDER — SODIUM CHLORIDE 0.9 % IV BOLUS
500.0000 mL | Freq: Once | INTRAVENOUS | Status: AC
  Administered 2019-02-19: 500 mL via INTRAVENOUS

## 2019-02-19 MED ORDER — IOHEXOL 350 MG/ML SOLN
75.0000 mL | Freq: Once | INTRAVENOUS | Status: AC | PRN
  Administered 2019-02-19: 09:00:00 65 mL via INTRAVENOUS

## 2019-02-19 NOTE — Progress Notes (Signed)
Pt was admitted with hypoxia and tachypnea, pt has been on bipap throughout night and presently, MD in to see pt on round, order for stat CT, RVP panel collected and on droplet precautions.

## 2019-02-19 NOTE — Progress Notes (Signed)
Pt on droplet, RVP collected and sent to lab. Pt NPO on bipap, order for stat CT, they called and I notified [facility] RT of pt going to CT. Pt says she feels fine at this time and denies needs

## 2019-02-19 NOTE — Consult Note (Signed)
NAME:  Julia Massey, MRN:  [id number], DOB:  05-Aug-1950, LOS: 1 ADMISSION DATE:  02/11/2019, CONSULTATION DATE:  02/19/19 REFERRING MD:  Daron Offer IMTS, CHIEF COMPLAINT:  Acute on chronic respiratory failure  Brief History   68 yo F Hx COPD who has been on BiPAP overnight.   History of present illness   68 yo F PMH COPD, chronic pain grade I dialstolic dysfunction who presented to ED 10/11 with CC SOB. Myalgias, malaise, nightsweats began 2 days prior to presentation. Patient began taking leftover levaquin from prior infection x 2 doses. Additionally she took [employer], as well as home albuterol. On Sunday, patient with significantly worse SOB. Denied fever, cough, sore throat, rhinitis, HA, n/v/d, dizziness, changes in appetite.   Patient presents with leukocytosos 18.8. In ED she was given duonebs ans started on abx + steroids. COVID-19 negative. Patient placed on NRB with SpO2 92% Presenting ABG: 7.446/ 26/55/18/19/91   Admitted to IMTS and started on BiPAP overnight. Remains on BiPAP this morning.  PCCM asked to see 10/12.  Past Medical History  COPD Carpal Tunnel Chronic back pain Gout HLD Diastolic heart dysfunction   Significant Hospital Events   10/11 admitted 10/12 PCCM consulted. PCCM reduced O2 on BiPAP from 100 to 60   Consults:  PCCM  Procedures:   Significant Diagnostic Tests:  10/11 CXR> Chronic interstitial lung disease with new R sided opacities.  10/12 ECHO >>> 10/12 CT angio chest> No PE. enlarged heart, no pericardial effusion. Diffuse ground-glass appearance throughout lungs. No Pleural Effusion. Emphysematous changes.   Micro Data:  10/11 SARS CoV2> neg  10/12 RVP neg  Antimicrobials:  Azithroymycin 10/11>> Rocephin 10/11>>   Interim history/subjective:  Patient on BiPAP FiO2 100%, however mask without seal on face. Patient refused to allow evaluation off of BiPAP. BiPAP mask re-adjusted for appropriate seal and FiO2 decreased from 100 % to  60%   Objective   Blood pressure (!) 164/95, pulse 81, temperature 98.7 F (37.1 C), resp. rate (!) 40, height 5' (1.524 m), weight 50.7 kg, SpO2 95 %.    FiO2 (%):  [50 %-60 %] 50 %   Intake/Output Summary (Last 24 hours) at 02/19/2019 1120 Last data filed at 02/19/2019 0451 Gross per 24 hour  Intake 859.22 ml  Output 1000 ml  Net -140.78 ml   Filed Weights   02/10/2019 1212 02/27/2019 2157 02/19/19 0109  Weight: 51.3 kg 50.7 kg 50.7 kg    Examination: General: Frail appearing older adult F, on BiPAP in NAD HENT: NCAT. Anicteric sclera. BiPAP secure. Trachea midline  Lungs: Faint nspiratory wheeze. Scattered crackles. No accessory muscle use. Symmetrical chest expansion  Cardiovascular: RRR s1s2 Cap refil < 3 seconds  Abdomen: Soft, flat, ndnt. Normoactive  Extremities: Symmetrical bulk and tone. No obvious joint deformity. No cyanosis or clubbing  Neuro: AAO x4.  Psych: Anxious mood, congruent affect and psychomotor movement  Skin: Pale clean warm dry   Resolved Hospital Problem list     Assessment & Plan:   Acute on chronic respiratory failure with hypoxia  -On BiPAP -This is likely multifactorial with possible contributing factors outlined below Hx COPD Pulm edema on imaging, with associated elevated BNP and enlarged cardiac silhouette  -given 1x dose of lasix when presented -Hx diastolic dysfunction R sided patchy opacity- possible PNA  -started on azithromycin, rocephin -PCT 0.18 -COVID-19 neg, RVP neg  P -Continue abx as above -Wean O2 support as able. I weaned BiPAP O2 from 100 to 60. Patient  can likely wean further today -- -Needs coaching to help mitigate anxiety while weaning (would not favor pharmacologic anxiolysis) -SpO2 goal 88-92%  -Repeat lasix; follow renal indices and UOP  -Pulm hygiene, CPT, IS, Flutter -NPO on BiPAP  -BDs  -ECHO is pending, follow up result    Rest per primary   Best practice:  Diet: NPO  Pain/Anxiety/Delirium protocol  (if indicated): APAP VAP protocol (if indicated): na DVT prophylaxis: lovenox GI prophylaxis: na Glucose control: na Mobility: Recommend mobilization as tolerated  Code Status: Full  Family Communication: patient updated  Disposition: Progressive   Labs   CBC: Recent Labs  Lab 02/13/2019 1203 02/23/2019 1253 02/14/2019 2317 02/19/19 0244  WBC 18.8*  --  13.8* 15.3*  NEUTROABS 14.0*  --   --  13.2*  HGB 14.4 14.3 15.8* 14.9  HCT 44.3 42.0 46.4* 43.7  MCV 95.7  --  93.9 92.2  PLT 445*  --  286 580    Basic Metabolic Panel: Recent Labs  Lab 02/17/2019 1203 02/20/2019 1253 03/07/2019 2317 02/19/19 0244  NA 138 137  --  141  K 3.4* 3.0*  --  4.0  CL 102  --   --  106  CO2 20*  --   --  20*  GLUCOSE 186*  --   --  148*  BUN 20  --   --  18  CREATININE 0.83  --  0.81 0.62  CALCIUM 8.9  --   --  8.8*   GFR: Estimated Creatinine Clearance: 49.7 mL/min (by C-G formula based on SCr of 0.62 mg/dL). Recent Labs  Lab 02/21/2019 1203 03/07/2019 2317 02/19/19 0244  PROCALCITON  --   --  0.18  WBC 18.8* 13.8* 15.3*  LATICACIDVEN  --  4.5* 2.6*    Liver Function Tests: Recent Labs  Lab 02/28/2019 1203 02/19/19 0244  AST 31 28  ALT 11 11  ALKPHOS 110 111  BILITOT 1.4* 0.6  PROT 6.7 7.2  ALBUMIN 3.2* 3.2*   No results for input(s): LIPASE, AMYLASE in the last 168 hours. No results for input(s): AMMONIA in the last 168 hours.  ABG    Component Value Date/Time   PHART 7.472 (H) 02/19/2019 0315   PCO2ART 31.5 (L) 02/19/2019 0315   PO2ART 75.9 (L) 02/19/2019 0315   HCO3 22.8 02/19/2019 0315   TCO2 19 (L) 03/02/2019 1253   ACIDBASEDEF 0.4 02/19/2019 0315   O2SAT 95.3 02/19/2019 0315     Coagulation Profile: No results for input(s): INR, PROTIME in the last 168 hours.  Cardiac Enzymes: No results for input(s): CKTOTAL, CKMB, CKMBINDEX, TROPONINI in the last 168 hours.  HbA1C: No results found for: HGBA1C  CBG: Recent Labs  Lab 02/19/19 0609  GLUCAP 133*    Review  of Systems:   As per HPI  Past Medical History  She,  has a past medical history of ADHD, CHF (congestive heart failure) ([place]), Chronic pain, COPD (chronic obstructive pulmonary disease) ([place]), COPD exacerbation ([place]), GERD (gastroesophageal reflux disease), Hypercholesterolemia, and Sleep apnea.   Surgical History   History reviewed. No pertinent surgical history.   Social History   reports that she quit smoking about 3 years ago. Her smoking use included cigarettes. She has a 3.75 pack-year smoking history. She has never used smokeless tobacco. She reports current alcohol use of about 4.0 standard drinks of alcohol per week. She reports that she does not use drugs.   Family History   Her family history is not on file.  Allergies No Known Allergies   Home Medications  Prior to Admission medications   Medication Sig Start Date End Date Taking? Authorizing Provider  amphetamine-dextroamphetamine (ADDERALL) 30 MG tablet Take 30 mg by mouth daily.   Yes [provider]  cetirizine (ZYRTEC) 10 MG tablet Take 10 mg by mouth daily as needed for allergies. Reported on 06/18/2015   Yes [provider]  Cyanocobalamin ([employer] B-12) 1000 MCG/15ML LIQD Take 1,000 mcg by mouth daily.    Yes [provider]  FLUoxetine (PROZAC) 40 MG capsule Take 1 capsule (40 mg total) by mouth daily. 06/12/15  Yes Angiulli, Mcarthur Rossetti, PA-C  folic acid (FOLVITE) 1 MG tablet Take 1 tablet (1 mg total) by mouth daily. 06/12/15  Yes Angiulli, Mcarthur Rossetti, PA-C  HYDROcodone-acetaminophen (NORCO) 10-325 MG tablet Take 1 tablet by mouth every 6 (six) hours as needed for moderate pain.   Yes [provider]  ibuprofen (ADVIL) 200 MG tablet Take 400 mg by mouth every 6 (six) hours as needed for moderate pain.   Yes [provider]  levofloxacin (LEVAQUIN) 500 MG tablet Take 500 mg by mouth daily.   Yes [provider]  naproxen sodium (ALEVE) 220 MG tablet Take 220 mg by mouth as  needed (pain).   Yes [provider]  polyethylene glycol (MIRALAX / GLYCOLAX) packet Take 17 g by mouth daily. Patient not taking: Reported on 06/18/2015 06/12/15   Charlton Amor, PA-C     Tessie Fass MSN, AGACNP-BC [facility] [phone] If no answer, [phone] 02/19/2019, 11:20 AM

## 2019-02-19 NOTE — Progress Notes (Signed)
Subjective:   Ms. Zale notes that she is doing okay this morning. She continues to have SOB but feels the bipap is helping a lot. She endorses feeling a bit of respiratory exhaustion. She notes that chest pain has resolved. Denies N/V and abdominal pain.   Objective:  Vital signs in last 24 hours: Vitals:   02/19/19 0109 02/19/19 0252 02/19/19 0300 02/19/19 0453  BP: (!) 153/93   (!) 146/104  Pulse: (!) 104  100 97  Resp: (!) 28  (!) 38 (!) 34  Temp:    97.8 F (36.6 C)  TempSrc:    Oral  SpO2: 93% 95% 94% 95%  Weight: 50.7 kg     Height:        Physical Exam Vitals signs and nursing note reviewed.  Constitutional:      Appearance: She is well-developed.  Cardiovascular:     Rate and Rhythm: Regular rhythm. Tachycardia present.     Heart sounds: No murmur.  Pulmonary:     Effort: Tachypnea and accessory muscle usage present.     Breath sounds: No decreased breath sounds, wheezing, rhonchi or rales.  Abdominal:     General: Bowel sounds are normal.     Palpations: Abdomen is soft.  Musculoskeletal:     Right lower leg: She exhibits no tenderness. No edema.     Left lower leg: She exhibits no tenderness. No edema.  Skin:    General: Skin is warm and dry.     Findings: No ecchymosis or rash.  Neurological:     General: No focal deficit present.     Mental Status: She is alert and oriented to person, place, and time.    Assessment/Plan:  Active Problems:   Acute respiratory failure with hypoxemia (HCC)  # Acute hypoxic respiratory failure  Patient continues to require Bipap and is becoming increasing tachypneic. Repeat ABG showed some improvement in her oxygenation, and continued to have low CO2 retention. Her low CO2 levels do make her COPD diagnosis questionable. Per chart review, no formal PFTs could be found. Some xrays noted evidence of COPD but last two CTs show more of a interstitial lung disease type picture. She only uses an albuterol inhaler Prn, so if  she does have COPD, likely Gold 1. Her leukocytosis is improving and she continues to be afebrile, but her initial chest xray showed area consistent with pneumonia. Initial procalcitonin was low at 0.18, and RVP is negative. There is still concern for CAP though. Due to concern for possible PE given her tachycardia and hypoxia, CTA was obtained today and showed evidence of pulmonary arterial hypertension with diffuse ground glass opacities. Previous CT also was notable for right atrial enlargement, so likely chronic and worsening. She is not currently being treated for PAH.   There is concern she will fail Bipap with her increasing tachypnea, so PCCM was consulted.   - PCCM consult - Telemetry - Bipap, wean as tolerated - Duoneb TID, per RT - Ceftriaxone 2g q24h  - Azithromycin 500mg  q24h   # Heart Failure  Last echo in 2017 showed grade 1 diastolic dysfunction with preserved EF at 55-60%. BNP on this admission was 1170.  Appears Lasix yesterday helped as crackles have resolved. PCCM ordered another dose of Lasix be given today as well. TTE was obtained today and results pending. Given PAH, concerning for right or bilateral heart failure.   - Telemetry - Strict I/Os  - Daily weights - Additional dose of Lasix per  PCCM   Dispo: Anticipated discharge pending clinical improvement.   Dr. Jose Persia Internal Medicine PGY-1  Pager: [phone] 02/19/2019, 7:58 AM

## 2019-02-19 NOTE — Progress Notes (Signed)
  Echocardiogram 2D Echocardiogram has been performed.  Julia Massey 02/19/2019, 10:52 AM

## 2019-02-19 NOTE — Progress Notes (Signed)
PT ews 4 due to HR , pt has been tachynic since admission. Pt on bipap,pt just received neb treatment and iv lasix earlier, HR up to low 100's, pt just had me call her husband who became very upset that I could not give exact length of hospitalization and wanted me to have him talk to somebody who knew something. Called Dr B with IM and advised of MEWS HR etc. No orders, she just got iv solumedrol and with stress of phone call , will monitor, frequency set and monitor, RT was just in to see pt as well

## 2019-02-19 NOTE — Progress Notes (Signed)
Date: 02/19/2019  Patient name: Julia Massey  Medical record number: [id number]  Date of birth: 18-Jun-1950   I have seen and evaluated Julia Massey and discussed their care with the Residency Team.  Julia Massey is a 68 year old community dwelling woman with a self-reported history of COPD requiring outpatient treatment with prednisone 2 times a year who presents with acute dyspnea.  2 days prior to admission, she had myalgias and malaise along with diaphoresis.  She started taking leftover levofloxacin.  On the day of admission, she woke up with dyspnea and use her albuterol inhaler and went back to sleep.  Upon waking, she had worsening dyspnea along with central chest pain.  The only other associated symptom is diarrhea for 2 days but she denies cough, abdominal pain, headache, or dizziness.  EMS treated her with Solu-Medrol, albuterol, and epinephrine.  In the ED, she remained hypoxic on a nonrebreather at 15 L and was placed on BiPAP which she has remained on overnight.  This morning, she states this started like a normal COPD flare.  She states she feels a little better today but is worried she is going to get fatigued.  Vitals:   02/19/19 0955 02/19/19 1121  BP: (!) 164/95   Pulse: 81 97  Resp: (!) 40 (!) 36  Temp: 98.7 F (37.1 C)   SpO2: 95% 96%  General lying in bed on BiPAP, severe tachypnea, increased work of breathing, increased accessory muscle usage, able to speak in few words but not sentences HRRR no murmur Lungs good airflow, no wheezing, crackles in bases Ext no edema  Pertinent labs RVP negative Covid negative ABG 7.45/26/55   >>>   7.47/32/76 BNP 1175 Lactic acid 4.5   >>>  2.6 Procalcitonin 0.18 WBC 18.8 ANC 14  I personally viewed the CXR images and confirmed my reading with the official read.  1 view AP portable semierect.  Rotated, overpenetrated.  Diffuse bilateral interstitial infiltrates.  I personally viewed the EKG and confirmed my reading with  the official read.  Sinus tach, normal axis, LAE, nonspecific T wave changes  CTA negative for PE, pulmonary artery hypertension suggested by enlarged pulmonary arteries and RV, diffuse groundglass attenuation throughout, changes consistent with chronic ILD  Assessment and Plan: I have seen and evaluated the patient as outlined above. I agree with the formulated Assessment and Plan as detailed in the residents' note, with the following changes: Julia Massey is a 68 year old community dwelling woman with a self-reported history of COPD who developed myalgias, malaise, diaphoresis, and dyspnea at home over 1 to 2 days.  On admission, she required BiPAP to prevent hypoxia but has remained dyspneic with tachypnea, accessory muscle usage, and increased work of breathing.  The team is concerned about respiratory fatigue and asked pulmonary critical care to evaluate the patient which is currently underway.  The etiology of her acute symptoms is not clear.  Her Covid test is negative and she denies cough.  Her BNP is elevated but shows no signs of volume overload either clinically or on CT scan and did not improve with diuresis.  COPD exacerbation is possible but she has no CO2 retention and never had wheezing documented on examination.  Community-acquired pneumonia is possible but her procalcitonin is only minimally elevated.  PE was considered and ruled out with a CT scan.  Her CT scan does show ILD and changes consistent with PAH but these should not have any acute worsening without an inciting factor.  Active Problems  1.  Acute hypoxic respiratory failure 2.  Chronic respiratory alkalemia 3.  Self-reported history of COPD 4.  CT changes consistent with ILD and PAD  Plan 1.  Continue supplemental oxygen via BiPAP with weaning as tolerated 2.  Continue Rocephin and azithromycin 3.  Continue duo nebs 4.  Await final pulmonary critical care recommendations 5.  Follow-up echo results  Burns Spain,  MD 10/12/20201:01 PM

## 2019-02-19 NOTE — Progress Notes (Signed)
CCMD called to inform pt  Going to CT, RT at bedside, attempted to wean off bipap but pt sats 80's on [place], pt back on bipap, pt mouth swabbed,pt to CT in bed with transporter and RT

## 2019-02-19 NOTE — Progress Notes (Signed)
Pt transferred on Bi-PAP with RT to STAT CT and back to room. No apparent complications. Trialed pt off of Bi-PAP and [place] 6L, SATS dropped to 80% within a few minutes. RT will continue to monitor pt.

## 2019-02-19 NOTE — Progress Notes (Signed)
Pt mews 3 due to RR, advised of resp panel negative, also ct results back, she said they are similar to when pt needed to be intubated 3 years ago, advised RT tried to wean to [place] but unable due to sats 77%, will review with attending

## 2019-02-19 NOTE — Progress Notes (Signed)
RT NOTES: Patient placed on 11L [place] HFNC, sats 95%. Will continue to monitor. RN at bedside.

## 2019-02-19 NOTE — Progress Notes (Signed)
Patient's Lactic Acid was critical 4.5. Paged MD and placed an order for a 500 ml bolus of NS.

## 2019-02-20 ENCOUNTER — Inpatient Hospital Stay (HOSPITAL_COMMUNITY): Payer: Medicare HMO

## 2019-02-20 ENCOUNTER — Telehealth: Payer: Self-pay | Admitting: *Deleted

## 2019-02-20 ENCOUNTER — Ambulatory Visit: Payer: Medicare HMO | Admitting: Diagnostic Neuroimaging

## 2019-02-20 DIAGNOSIS — J189 Pneumonia, unspecified organism: Secondary | ICD-10-CM

## 2019-02-20 DIAGNOSIS — I5030 Unspecified diastolic (congestive) heart failure: Secondary | ICD-10-CM | POA: Diagnosis not present

## 2019-02-20 DIAGNOSIS — Z79891 Long term (current) use of opiate analgesic: Secondary | ICD-10-CM

## 2019-02-20 DIAGNOSIS — E874 Mixed disorder of acid-base balance: Secondary | ICD-10-CM | POA: Diagnosis not present

## 2019-02-20 DIAGNOSIS — M0510 Rheumatoid lung disease with rheumatoid arthritis of unspecified site: Secondary | ICD-10-CM

## 2019-02-20 DIAGNOSIS — D62 Acute posthemorrhagic anemia: Secondary | ICD-10-CM | POA: Diagnosis not present

## 2019-02-20 DIAGNOSIS — J441 Chronic obstructive pulmonary disease with (acute) exacerbation: Secondary | ICD-10-CM | POA: Diagnosis not present

## 2019-02-20 DIAGNOSIS — J9622 Acute and chronic respiratory failure with hypercapnia: Secondary | ICD-10-CM | POA: Diagnosis not present

## 2019-02-20 DIAGNOSIS — M549 Dorsalgia, unspecified: Secondary | ICD-10-CM | POA: Diagnosis not present

## 2019-02-20 DIAGNOSIS — J84112 Idiopathic pulmonary fibrosis: Secondary | ICD-10-CM | POA: Diagnosis not present

## 2019-02-20 DIAGNOSIS — I5033 Acute on chronic diastolic (congestive) heart failure: Secondary | ICD-10-CM | POA: Diagnosis not present

## 2019-02-20 DIAGNOSIS — Z79899 Other long term (current) drug therapy: Secondary | ICD-10-CM

## 2019-02-20 DIAGNOSIS — E43 Unspecified severe protein-calorie malnutrition: Secondary | ICD-10-CM | POA: Diagnosis not present

## 2019-02-20 DIAGNOSIS — F339 Major depressive disorder, recurrent, unspecified: Secondary | ICD-10-CM

## 2019-02-20 DIAGNOSIS — J9601 Acute respiratory failure with hypoxia: Secondary | ICD-10-CM | POA: Diagnosis not present

## 2019-02-20 DIAGNOSIS — J9621 Acute and chronic respiratory failure with hypoxia: Secondary | ICD-10-CM

## 2019-02-20 DIAGNOSIS — G8929 Other chronic pain: Secondary | ICD-10-CM | POA: Diagnosis not present

## 2019-02-20 DIAGNOSIS — Z20828 Contact with and (suspected) exposure to other viral communicable diseases: Secondary | ICD-10-CM | POA: Diagnosis not present

## 2019-02-20 DIAGNOSIS — J449 Chronic obstructive pulmonary disease, unspecified: Secondary | ICD-10-CM | POA: Diagnosis not present

## 2019-02-20 LAB — CYCLIC CITRUL PEPTIDE ANTIBODY, IGG/IGA: CCP Antibodies IgG/IgA: 6 units (ref 0–19)

## 2019-02-20 LAB — BASIC METABOLIC PANEL
Anion gap: 19 — ABNORMAL HIGH (ref 5–15)
Anion gap: 16 — ABNORMAL HIGH (ref 5–15)
BUN: 34 mg/dL — ABNORMAL HIGH (ref 8–23)
BUN: 37 mg/dL — ABNORMAL HIGH (ref 8–23)
CO2: 24 mmol/L (ref 22–32)
CO2: 24 mmol/L (ref 22–32)
Calcium: 9.5 mg/dL (ref 8.9–10.3)
Calcium: 9 mg/dL (ref 8.9–10.3)
Chloride: 99 mmol/L (ref 98–111)
Chloride: 101 mmol/L (ref 98–111)
Creatinine, Ser: 0.64 mg/dL (ref 0.44–1.00)
Creatinine, Ser: 0.61 mg/dL (ref 0.44–1.00)
GFR calc Af Amer: >60 mL/min (ref >60)
GFR calc Af Amer: >60 mL/min (ref >60)
GFR calc non Af Amer: >60 mL/min (ref >60)
GFR calc non Af Amer: >60 mL/min (ref >60)
Glucose, Bld: 140 mg/dL — ABNORMAL HIGH (ref 70–99)
Glucose, Bld: 159 mg/dL — ABNORMAL HIGH (ref 70–99)
Potassium: 5.9 mmol/L — ABNORMAL HIGH (ref 3.5–5.1)
Potassium: 3.3 mmol/L — ABNORMAL LOW (ref 3.5–5.1)
Sodium: 142 mmol/L (ref 135–145)
Sodium: 141 mmol/L (ref 135–145)

## 2019-02-20 LAB — CBC WITH DIFFERENTIAL/PLATELET
Abs Immature Granulocytes: 0 10*3/uL (ref 0.00–0.07)
Basophils Absolute: 0 10*3/uL (ref 0.0–0.1)
Basophils Relative: 0 %
Eosinophils Absolute: 0 10*3/uL (ref 0.0–0.5)
Eosinophils Relative: 0 %
HCT: 49.8 % — ABNORMAL HIGH (ref 36.0–46.0)
Hemoglobin: 15.9 g/dL — ABNORMAL HIGH (ref 12.0–15.0)
Lymphocytes Relative: 4 %
Lymphs Abs: 0.5 10*3/uL — ABNORMAL LOW (ref 0.7–4.0)
MCH: 30.3 pg (ref 26.0–34.0)
MCHC: 31.9 g/dL (ref 30.0–36.0)
MCV: 95 fL (ref 80.0–100.0)
Monocytes Absolute: 0.3 10*3/uL (ref 0.1–1.0)
Monocytes Relative: 2 %
Neutro Abs: 12.3 10*3/uL — ABNORMAL HIGH (ref 1.7–7.7)
Neutrophils Relative %: 94 %
Platelets: 353 10*3/uL (ref 150–400)
RBC: 5.24 MIL/uL — ABNORMAL HIGH (ref 3.87–5.11)
RDW: 13.3 % (ref 11.5–15.5)
WBC: 13.1 10*3/uL — ABNORMAL HIGH (ref 4.0–10.5)
nRBC: 0 % (ref 0.0–0.2)
nRBC: 0 /100 WBC

## 2019-02-20 LAB — GLUCOSE, CAPILLARY: Glucose-Capillary: 138 mg/dL — ABNORMAL HIGH (ref 70–99)

## 2019-02-20 LAB — MAGNESIUM: Magnesium: 2.5 mg/dL — ABNORMAL HIGH (ref 1.7–2.4)

## 2019-02-20 LAB — SJOGRENS SYNDROME-B EXTRACTABLE NUCLEAR ANTIBODY: SSB (La) (ENA) Antibody, IgG: <0.2 AI (ref 0.0–0.9)

## 2019-02-20 LAB — SJOGRENS SYNDROME-A EXTRACTABLE NUCLEAR ANTIBODY: SSA (Ro) (ENA) Antibody, IgG: <0.2 AI (ref 0.0–0.9)

## 2019-02-20 LAB — RHEUMATOID FACTOR: Rheumatoid fact SerPl-aCnc: 36.6 IU/mL — ABNORMAL HIGH (ref 0.0–13.9)

## 2019-02-20 LAB — ANTI-JO 1 ANTIBODY, IGG: Anti JO-1: <0.2 AI (ref 0.0–0.9)

## 2019-02-20 LAB — ANA W/REFLEX IF POSITIVE: Anti Nuclear Antibody (ANA): NEGATIVE

## 2019-02-20 MED ORDER — POTASSIUM CHLORIDE CRYS ER 20 MEQ PO TBCR: 40.0000 meq | EXTENDED_RELEASE_TABLET | Freq: Once | ORAL | Status: DC

## 2019-02-20 MED ORDER — HYDROCODONE-ACETAMINOPHEN 5-325 MG PO TABS
1.0000 | ORAL_TABLET | Freq: Four times a day (QID) | ORAL | Status: DC | PRN
  Administered 2019-02-20 – 2019-03-02 (×24): 1 via ORAL
  Administered 2019-03-03: 2 via ORAL
  Administered 2019-03-03 – 2019-03-04 (×3): 1 via ORAL
  Administered 2019-03-05 (×2): 2 via ORAL
  Administered 2019-03-06 – 2019-03-07 (×4): 1 via ORAL
  Administered 2019-03-08 (×2): 2 via ORAL
  Administered 2019-03-08: 1 via ORAL
  Administered 2019-03-09 – 2019-03-10 (×5): 2 via ORAL
  Filled 2019-02-20 (×5): qty 1
  Filled 2019-02-20: qty 2
  Filled 2019-02-20 (×4): qty 1
  Filled 2019-02-20: qty 2
  Filled 2019-02-20 (×5): qty 1
  Filled 2019-02-20 (×2): qty 2
  Filled 2019-02-20: qty 1
  Filled 2019-02-20: qty 2
  Filled 2019-02-20: qty 1
  Filled 2019-02-20: qty 2
  Filled 2019-02-20 (×3): qty 1
  Filled 2019-02-20: qty 2
  Filled 2019-02-20 (×4): qty 1
  Filled 2019-02-20 (×2): qty 2
  Filled 2019-02-20 (×10): qty 1

## 2019-02-20 MED ORDER — FLUOXETINE HCL 20 MG PO CAPS
40.0000 mg | ORAL_CAPSULE | Freq: Every day | ORAL | Status: DC
  Administered 2019-02-20 – 2019-03-11 (×20): 40 mg via ORAL
  Filled 2019-02-20 (×21): qty 2

## 2019-02-20 MED ORDER — POTASSIUM CHLORIDE CRYS ER 20 MEQ PO TBCR
20.0000 meq | EXTENDED_RELEASE_TABLET | Freq: Once | ORAL | Status: AC
  Administered 2019-02-20: 10:00:00 20 meq via ORAL
  Filled 2019-02-20: qty 1

## 2019-02-20 MED ORDER — SALINE SPRAY 0.65 % NA SOLN
1.0000 | NASAL | Status: DC | PRN
  Administered 2019-02-21 – 2019-02-24 (×3): 1 via NASAL
  Filled 2019-02-20: qty 44

## 2019-02-20 MED ORDER — IBUPROFEN 200 MG PO TABS
400.0000 mg | ORAL_TABLET | Freq: Once | ORAL | Status: AC
  Administered 2019-02-20: 400 mg via ORAL
  Filled 2019-02-20: qty 2

## 2019-02-20 MED ORDER — SODIUM ZIRCONIUM CYCLOSILICATE 10 G PO PACK
10.0000 g | PACK | Freq: Once | ORAL | Status: DC
  Filled 2019-02-20: qty 1

## 2019-02-20 MED ORDER — FUROSEMIDE 20 MG PO TABS
20.0000 mg | ORAL_TABLET | Freq: Every day | ORAL | Status: AC
  Administered 2019-02-20 – 2019-02-22 (×3): 20 mg via ORAL
  Filled 2019-02-20 (×3): qty 1

## 2019-02-20 NOTE — Progress Notes (Signed)
Pt asking about charger, left message for sara volunteer to request c adapter charge for pt phone which has no battery now, she is not sure if amily will be able to bring one today

## 2019-02-20 NOTE — Telephone Encounter (Signed)
Patient was not at appointment today because she is hospitalized.

## 2019-02-20 NOTE — Progress Notes (Addendum)
NAME:  Julia Massey, MRN:  [id number], DOB:  Oct 23, 1950, LOS: 2 ADMISSION DATE:  02/22/2019, CONSULTATION DATE:  02/19/19 REFERRING MD:  Daron Offer IMTS, CHIEF COMPLAINT:  Acute on chronic respiratory failure  Brief History   68 yo F Hx COPD who has been on BiPAP overnight.   History of present illness   68 yo F PMH COPD, chronic pain grade I dialstolic dysfunction who presented to ED 10/11 with CC SOB. Myalgias, malaise, nightsweats began 2 days prior to presentation. Patient began taking leftover levaquin from prior infection x 2 doses. Additionally she took [employer], as well as home albuterol. On Sunday, patient with significantly worse SOB. Denied fever, cough, sore throat, rhinitis, HA, n/v/d, dizziness, changes in appetite.   Patient presents with leukocytosos 18.8. In ED she was given duonebs ans started on abx + steroids. COVID-19 negative. Patient placed on NRB with SpO2 92% Presenting ABG: 7.446/ 26/55/18/19/91   Admitted to IMTS and started on BiPAP overnight. Remains on BiPAP this morning.  PCCM asked to see 10/12.  Past Medical History  COPD Carpal Tunnel Chronic back pain Gout HLD Diastolic heart dysfunction   Significant Hospital Events   10/11 admitted 10/12 PCCM consulted. PCCM reduced O2 on BiPAP from 100 to 60   Consults:  PCCM  Procedures:   Significant Diagnostic Tests:  10/11 CXR> Chronic interstitial lung disease with new R sided opacities.  10/12 ECHO >>> 10/12 CT angio chest> No PE. enlarged heart, no pericardial effusion. Diffuse ground-glass appearance throughout lungs. No Pleural Effusion. Emphysematous changes.  10/13 ECHO> 1. Left ventricular ejection fraction, by visual estimation, is 55 to 60%. The left ventricle has normal function. Normal left ventricular size. There is no left ventricular hypertrophy.  2. Left ventricular diastolic Doppler parameters are consistent with impaired relaxation pattern of LV diastolic filling.  3. Global  right ventricle has severely reduced systolic function.The right ventricular size is mildly enlarged.  4. Left atrial size was mildly dilated.  5. Right atrial size was normal.  6. The mitral valve is normal in structure. Trace mitral valve regurgitation. No evidence of mitral stenosis.  7. The tricuspid valve is normal in structure. Tricuspid valve regurgitation is mild.  8. The aortic valve is tricuspid Aortic valve regurgitation was not visualized by color flow Doppler. Structurally normal aortic valve, with no evidence of sclerosis or stenosis.  9. The pulmonic valve was not well visualized. Pulmonic valve regurgitation is not visualized by color flow Doppler. 10. Moderately elevated pulmonary artery systolic pressure. 11. The inferior vena cava is dilated in size with >50% respiratory variability, suggesting right atrial pressure of 8 mmHg. 12. Normal LV systolic function; grade 1 diastolic dysfunction; mild LAE; mild RVE with severe RV dysfunction; cannot R/O thrombus in RV on apical images; suggest CTA to further assess; mild TR; moderate pulmonary hypertension.  10/13 CXR> Chronic diffuse bilateral interstitial prominence, with some interval improvement-- likely improved pulmonary edema   Micro Data:  10/11 SARS CoV2> neg  10/12 RVP neg  Antimicrobials:  Azithroymycin 10/11>> Rocephin 10/11>>  Interim history/subjective:  Patient on 15LNC with SpO2 96% I weaned patient to 11LNc with SpO2 91-95%  Patient states she is "so glad that mask of off" and is hungry.   Objective   Blood pressure (!) 165/104, pulse 92, temperature (!) 97.5 F (36.4 C), temperature source Oral, resp. rate (!) 22, height 5' (1.524 m), weight 51.6 kg, SpO2 94 %.    FiO2 (%):  [60 %] 60 %  Intake/Output Summary (Last 24 hours) at 02/20/2019 1004 Last data filed at 02/20/2019 0936 Gross per 24 hour  Intake 590 ml  Output 2450 ml  Net -1860 ml   Filed Weights   02/17/2019 2157 02/19/19 0109 02/20/19  0430  Weight: 50.7 kg 50.7 kg 51.6 kg    Examination: General: Frail appearing older adult F, supine in bed NAD on 11L HFNC  HENT: NCAT, pink mmm, trachea midline, patent nares with Moore in place  Lungs: Scattered bilateral rhonchi. Symmetrical chest expansion no accessory muscle use   Cardiovascular: RRR s1s2 cap refill < 3sec  Abdomen: flat, soft, ndnt normoactive  Extremities: Symmetrical bulk and tone no edema no obvious deformity   Neuro:AAOx4 following commands  Psych: Calm, pleasant, cooperative  Skin: pale, clean, dry, warm   Resolved Hospital Problem list     Assessment & Plan:   Acute on chronic respiratory failure with hypoxia  -This is likely multifactorial with possible contributing factors outlined below COPD Chronic pulmonary interstitial prominence  Pulmonary HTN  Pulmonary edema  with associated elevated BNP and enlarged cardiac silhouette  -given 1x dose of lasix when presented, repeat lasix 69/62 -Hx diastolic dysfunction, pulm HTN -ECHO 10/12> LVEF 55-60%. Impaired diastolic filling. RV systolic dysfunction  R sided patchy opacity- possible PNA  -started on azithromycin, rocephin  -PCT 0.18 -COVID-19 neg, RVP neg  P -will dc abx. This does not seem to be CAP. -Would continue diuresis, will schedule 20mg  Lasix qD  -Follow renal indices, electrolytes-- replace PRN  -Continue to wean O2 support as able. Weaned from 15L to 11L with SpO2 remaining 92%  -SpO2 goal 88-92%   Continue pulm hygiene, IS, Flutter, CPT  -Recommend mobilization of patient -BDs  -Solumedrol -Follow up serologies-- at present, some concern for Rheumatoid Arthritis  -Will need outpatient pulm follow up as well as Rheum     Rest per primary   Best practice:  Diet: advancing to liquids per primary.  Pain/Anxiety/Delirium protocol (if indicated): APAP VAP protocol (if indicated): na DVT prophylaxis: lovenox GI prophylaxis: na  Glucose control: na Mobility: Recommend mobilization  as tolerated  Code Status: Full  Family Communication: patient updated  Disposition: Progressive   Labs   CBC: Recent Labs  Lab 02/24/2019 1203 02/20/2019 1253 02/12/2019 2317 02/19/19 0244 02/20/19 0437  WBC 18.8*  --  13.8* 15.3* 13.1*  NEUTROABS 14.0*  --   --  13.2* 12.3*  HGB 14.4 14.3 15.8* 14.9 15.9*  HCT 44.3 42.0 46.4* 43.7 49.8*  MCV 95.7  --  93.9 92.2 95.0  PLT 445*  --  286 287 952    Basic Metabolic Panel: Recent Labs  Lab 02/14/2019 1203 02/21/2019 1253 02/25/2019 2317 02/19/19 0244 02/20/19 0437 02/20/19 0812  NA 138 137  --  141 142 141  K 3.4* 3.0*  --  4.0 5.9* 3.3*  CL 102  --   --  106 99 101  CO2 20*  --   --  20* 24 24  GLUCOSE 186*  --   --  148* 140* 159*  BUN 20  --   --  18 34* 37*  CREATININE 0.83  --  0.81 0.62 0.64 0.61  CALCIUM 8.9  --   --  8.8* 9.5 9.0  MG  --   --   --   --  2.5*  --    GFR: Estimated Creatinine Clearance: 49.7 mL/min (by C-G formula based on SCr of 0.61 mg/dL). Recent Labs  Lab 02/26/2019 1203  Feb 26, 2019 2317 02/19/19 0244 02/20/19 0437  PROCALCITON  --   --  0.18  --   WBC 18.8* 13.8* 15.3* 13.1*  LATICACIDVEN  --  4.5* 2.6*  --     Liver Function Tests: Recent Labs  Lab 2019/02/26 1203 02/19/19 0244  AST 31 28  ALT 11 11  ALKPHOS 110 111  BILITOT 1.4* 0.6  PROT 6.7 7.2  ALBUMIN 3.2* 3.2*   No results for input(s): LIPASE, AMYLASE in the last 168 hours. No results for input(s): AMMONIA in the last 168 hours.  ABG    Component Value Date/Time   PHART 7.472 (H) 02/19/2019 0315   PCO2ART 31.5 (L) 02/19/2019 0315   PO2ART 75.9 (L) 02/19/2019 0315   HCO3 22.8 02/19/2019 0315   TCO2 19 (L) 02-26-19 1253   ACIDBASEDEF 0.4 02/19/2019 0315   O2SAT 95.3 02/19/2019 0315     Coagulation Profile: No results for input(s): INR, PROTIME in the last 168 hours.  Cardiac Enzymes: No results for input(s): CKTOTAL, CKMB, CKMBINDEX, TROPONINI in the last 168 hours.  HbA1C: No results found for: HGBA1C  CBG:  Recent Labs  Lab 02/19/19 0609 02/20/19 0521  GLUCAP 133* 138*      Tessie Fass MSN, AGACNP-BC [facility] [phone] If no answer, [phone] 02/20/2019, 10:04 AM

## 2019-02-20 NOTE — TOC Initial Note (Addendum)
Transition of Care [facility]) - Initial/Assessment Note    Patient Details  Name: Julia Massey MRN: [id number] Date of Birth: 08/27/1950  Transition of Care [facility]) CM/SW Contact:    Alberteen Sam, [place] Phone Number: [phone] 02/20/2019, 2:57 PM  Clinical Narrative:                  CSW met with patient at bedside to discuss discharge planning and Home Health PT recommendation. Patient reports she is agreeable to this with no preference of Home Health agency and gave CSW permission to fax out referrals. Patient reports she was not on oxygen at home and hopeful she will not need it at home at time of discharge. CSW informed patient that if she does need it CSW can set up for home oxygen needs.   Patient accepted by [facility] for PT services.   Expected Discharge Plan: [place] Barriers to Discharge: Continued Medical Work up   Patient Goals and CMS Choice Patient states their goals for this hospitalization and ongoing recovery are:: to go home CMS Medicare.gov Compare Post Acute Care list provided to:: Patient Choice offered to / list presented to : Patient  Expected Discharge Plan and Services Expected Discharge Plan: [place]       Living arrangements for the past 2 months: Single Family Home                           HH Arranged: PT          Prior Living Arrangements/Services Living arrangements for the past 2 months: Single Family Home Lives with:: Self Patient language and need for interpreter reviewed:: Yes Do you feel safe going back to the place where you live?: Yes      Need for Family Participation in Patient Care: No (Comment) Care giver support system in place?: No (comment)   Criminal Activity/Legal Involvement Pertinent to Current Situation/Hospitalization: No - Comment as needed  Activities of Daily Living Home Assistive Devices/Equipment: None ADL Screening (condition at time of  admission) Patient's cognitive ability adequate to safely complete daily activities?: Yes Is the patient deaf or have difficulty hearing?: No Does the patient have difficulty seeing, even when wearing glasses/contacts?: No Does the patient have difficulty concentrating, remembering, or making decisions?: No Patient able to express need for assistance with ADLs?: Yes Does the patient have difficulty dressing or bathing?: No Independently performs ADLs?: Yes (appropriate for developmental age) Does the patient have difficulty walking or climbing stairs?: No Weakness of Legs: None Weakness of Arms/Hands: None  Permission Sought/Granted Permission sought to share information with : Case Manager, Customer service manager, Family Supports Permission granted to share information with : Yes, Verbal Permission Granted     Permission granted to share info w AGENCY: Home health        Emotional Assessment Appearance:: Appears stated age Attitude/Demeanor/Rapport: Gracious Affect (typically observed): Calm Orientation: : Oriented to Self, Oriented to Place, Oriented to  Time, Oriented to Situation Alcohol / Substance Use: Not Applicable Psych Involvement: No (comment)  Admission diagnosis:  Hypoxia [R09.02] Community acquired pneumonia, unspecified laterality [J18.9] Patient Active Problem List   Diagnosis Date Noted  . Encephalopathy 05/30/2015  . Atypical pneumonia   . Acute pulmonary edema (HCC)   . Chronic diastolic CHF (congestive heart failure) ([place])   . Pulmonary hypertension ([place])   . HLD (hyperlipidemia)   . Hypernatremia   .  Anxiety state   . Depression   . Acute encephalopathy   . Acute respiratory failure with hypoxemia ([place] )   . Altered mental status   . COPD exacerbation ([place])   . Encephalopathy acute   . ARDS (adult respiratory distress syndrome) ([place])   . Encounter for orogastric (OG) tube placement   . Respiratory failure ([place])   . Acute respiratory  failure with hypoxia ([place]) 05/14/2015  . Malnutrition of moderate degree 05/14/2015   PCP:  Simona Huh, NP Pharmacy:   [facility] [address], [place] [address] Phone: [phone] Fax: [phone]     Social Determinants of Health (SDOH) Interventions    Readmission Risk Interventions No flowsheet data found.

## 2019-02-20 NOTE — Telephone Encounter (Signed)
Patient was no show for new patient appointment today. 

## 2019-02-20 NOTE — Progress Notes (Signed)
Late entry, pt was asking about her home meds like prozac, pt is on high flow o2 and asking if she can have ice, paged MD and he will look at meds ,prefers pt to have moistirizer only at this time due ot risk of aspriration if need back on bipap

## 2019-02-20 NOTE — Progress Notes (Addendum)
Patient BP 155/109. NP prns ordered.  RN paged imts.

## 2019-02-20 NOTE — Progress Notes (Signed)
Physical Therapy Evaluation Patient Details Name: Julia Massey MRN: [id number] DOB: 1951-03-02 Today's Date: 02/20/2019   History of Present Illness  Pt is a 68 y/o female admitted secondary to acute hypoxic respiratory failure likely secondary to acute pneumonitis. Pt placed on BiPap which has since been removed. PMH includes COPD and gout.   Clinical Impression  Pt admitted secondary to problem above with deficits below. Pt limited this session secondary to SOB and decreased oxygen sats. Pt only able to tolerate sitting EOB this session. Oxygen fluctuating between 83-88% on 11L of HFNC. Cues for pursed lip breathing to ensure adequate oxygenation. Oxygen sats returned to 88-90% on 11L upon return to supine. Feel pt will progress well once breathing improves. Will continue to follow acutely to maximize functional mobility independence and safety.     Follow Up Recommendations Home health PT(pending progress)    Equipment Recommendations  Other (comment)(TBD)    Recommendations for Other Services       Precautions / Restrictions Precautions Precautions: Fall;Other (comment) Precaution Comments: Watch O2 sats Restrictions Weight Bearing Restrictions: No      Mobility  Bed Mobility Overal bed mobility: Needs Assistance Bed Mobility: Supine to Sit;Sit to Supine     Supine to sit: Min assist Sit to supine: Supervision   General bed mobility comments: Min A for assist with trunk elevation. Pt with increased SOB and oxygen sats fluctuating between 83-88% on 11L of HFNC. Educated about pursed lip breathing. Further mobility deferred. Required supervision to return to supine. Oxygen sats returned to 88-90% on 11L.   Transfers                 General transfer comment: Deferred  Ambulation/Gait                Stairs            Wheelchair Mobility    Modified Rankin (Stroke Patients Only)       Balance Overall balance assessment: Needs  assistance Sitting-balance support: No upper extremity supported;Feet supported Sitting balance-Leahy Scale: Fair                                       Pertinent Vitals/Pain Pain Assessment: No/denies pain    Home Living Family/patient expects to be discharged to:: Private residence Living Arrangements: Spouse/significant other;Children Available Help at Discharge: Family Type of Home: House Home Access: Stairs to enter Entrance Stairs-Rails: None Technical brewer of Steps: 2 Home Layout: Two level Home Equipment: None      Prior Function Level of Independence: Independent               Hand Dominance        Extremity/Trunk Assessment   Upper Extremity Assessment Upper Extremity Assessment: Overall WFL for tasks assessed    Lower Extremity Assessment Lower Extremity Assessment: Generalized weakness    Cervical / Trunk Assessment Cervical / Trunk Assessment: Normal  Communication   Communication: No difficulties(easily winded when talking )  Cognition Arousal/Alertness: Awake/alert Behavior During Therapy: WFL for tasks assessed/performed Overall Cognitive Status: Within Functional Limits for tasks assessed                                        General Comments      Exercises     Assessment/Plan  PT Assessment Patient needs continued PT services  PT Problem List Cardiopulmonary status limiting activity;Decreased strength;Decreased mobility;Decreased activity tolerance;Decreased knowledge of use of DME       PT Treatment Interventions Gait training;Stair training;Functional mobility training;Therapeutic activities;Therapeutic exercise;DME instruction;Balance training;Patient/family education    PT Goals (Current goals can be found in the Care Plan section)  Acute Rehab PT Goals Patient Stated Goal: to be able to breathe better PT Goal Formulation: With patient Time For Goal Achievement: 02/25/2019 Potential  to Achieve Goals: Good    Frequency Min 3X/week   Barriers to discharge        Co-evaluation               AM-PAC PT "6 Clicks" Mobility  Outcome Measure Help needed turning from your back to your side while in a flat bed without using bedrails?: A Little Help needed moving from lying on your back to sitting on the side of a flat bed without using bedrails?: A Little Help needed moving to and from a bed to a chair (including a wheelchair)?: A Little Help needed standing up from a chair using your arms (e.g., wheelchair or bedside chair)?: A Little Help needed to walk in hospital room?: A Lot Help needed climbing 3-5 steps with a railing? : A Lot 6 Click Score: 16    End of Session Equipment Utilized During Treatment: Gait belt Activity Tolerance: Treatment limited secondary to medical complications (Comment) Patient left: in bed;with call bell/phone within reach Nurse Communication: Mobility status;Other (comment)(fluctuating oxygen sats) PT Visit Diagnosis: Muscle weakness (generalized) (M62.81);Difficulty in walking, not elsewhere classified (R26.2)    Time: 6712-4580 PT Time Calculation (min) (ACUTE ONLY): 15 min   Charges:   PT Evaluation $PT Eval Moderate Complexity: 1 Mod          Gladys Damme, PT, DPT  Acute Rehabilitation Services  Pager: [phone] Office: [phone]   Julia Massey 02/20/2019, 1:22 PM

## 2019-02-20 NOTE — Progress Notes (Addendum)
Subjective:   Julia Massey feels her breathing is doing much better with the HFNC and that she is feeling less SOB. She does not a couple concerns though. Her back pain has worsened significantly since admission, and she does take Norco 10s outpatient daily. In addition, she takes Prozac daily and feels that since it was held, her legs have become restless and she feels "shakey." She was wondering if we could restart her home medications.   Julia Massey notes she has been told in the past she has possible RA. She started follow up with a new doctor, Dr. Malen Gauze, since her prior PCP had a heart attack and retired. This doctor obtained bilateral hand xrays due to her hand pain and stiffness in the morning. She was told by him that it appeared unlike typical gout and more like possible RA.   We discussed what family members we need to update. She prefers we update a family friend instead of her husband, but notes that if her husband calls the nurse asking to speak to the MD, we can give him an update.   We discussed the plan moving forward and her lab/imaging results. All questions and concerns addressed.   Objective:  Vital signs in last 24 hours: Vitals:   02/20/19 0430 02/20/19 0724 02/20/19 0740 02/20/19 0846  BP:   (!) 165/104   Pulse:  89 96 92  Resp:  18 (!) 22   Temp:   (!) 97.5 F (36.4 C)   TempSrc:   Oral   SpO2:  96% 96% 94%  Weight: 51.6 kg     Height:        Physical Exam Vitals signs and nursing note reviewed.  Constitutional:      General: She is not in acute distress.    Appearance: She is well-developed. She is not toxic-appearing.  Cardiovascular:     Rate and Rhythm: Regular rhythm. Tachycardia present.     Heart sounds: No murmur.  Pulmonary:     Effort: Tachypnea present. No accessory muscle usage.     Breath sounds: Examination of the right-lower field reveals rales. Examination of the left-lower field reveals rales. Rales present. No decreased breath sounds,  wheezing or rhonchi.  Chest:     Chest wall: Tenderness (lower left chest tenderness, near epigastric abdominal region) present. No deformity or crepitus.  Abdominal:     General: Bowel sounds are normal.     Palpations: Abdomen is soft.     Tenderness: There is abdominal tenderness (epigastric only). There is no guarding.  Musculoskeletal:     Right lower leg: She exhibits no tenderness. No edema.     Left lower leg: She exhibits no tenderness. No edema.  Skin:    General: Skin is warm and dry.     Findings: No ecchymosis or rash.  Neurological:     General: No focal deficit present.     Mental Status: She is alert and oriented to person, place, and time.  Psychiatric:        Mood and Affect: Mood normal.        Behavior: Behavior normal.    Assessment/Plan:  Active Problems:   Acute respiratory failure with hypoxemia (HCC)  # Acute hypoxic respiratory failure  Significant improvement since yesterday, at which time she was switched to HFNC, currently on 10L and satting well when not talking a lot. Her CTA yesterday did not show a PE but was notable for bibasilar honeycombing, diffuse ground glass opacities,  enlarged right ventricle and pulmonary arteries consistent with pulmonary artery hypertension and chronic changes consistent with interstitial lung disease. PCCM was consulted yesterday and notes this could be ILD, which is supported by her CT findings. Rheumatologic workup notable for elevated sed rate and positive RF.  Given her history of possible RA appearing bony changes in the hands (will obtain xray to confirm), it is possible a history of untreated and progressive RA lead to a worsening in ILD. Likely ILD sequela includes PAH and right heart failure.   Continue supportive care with Duonebs, oxygen supplementation, in addition to steroids started yesterday.   Will follow up PCCM recommendations regarding antibiotics since CAP is low on the differential at this point. She  continues to be afebrile and WBC is normalized despite starting steroids.   - PCCM consult - Telemetry - HFNC @ 10L - Duoneb TID, per RT - Ceftriaxone 2g q24h, Azithromycin 500mg  q24h (D/C if PCCM is ok with that) - Solumedrol 60mg  q6h  # Heart Failure  Last echo in 2017 showed grade 1 diastolic dysfunction with preserved EF at 55-60%. BNP on this admission was 1170.  She received Lasix yesterday and did diuresis well but concerned she will dry out since she is NPO. Starting clear diet to day so hopefully will avoid that.   - Telemetry - Strict I/Os  - Daily weights  # Chronic Back Pain:  Restarted home Norco, however order placed for 1-2 tablets (5-10mg ) based on pain scale, as we do not want to over do it. At home she takes 10mg , at least twice per day.   - Norco 5-325mg , 1-2 tablets q6h PRN  # Hx of Depression: Restarted home Prozac   Dispo: Anticipated discharge pending clinical improvement.   Dr. Jose Persia Internal Medicine PGY-1  Pager: [phone] 02/20/2019, 9:17 AM

## 2019-02-20 NOTE — Progress Notes (Signed)
  Date: 02/20/2019  Patient name: Julia Massey  Medical record number: [id number]  Date of birth: May 03, 1951        I have seen and evaluated this patient and I have discussed the plan of care with the house staff. Please see their note for complete details. I concur with their findings with the following additions/corrections: [name] was seen this morning on team rounds.  She is now on high flow oxygen and looks much improved from yesterday.  Her tachypnea, work of breathing, and accessory muscle usage are all decreased.  She has good airflow but she does have crackles in the bases.  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  I agree with stopping antibiotics.  Pulmonary is continuing Solu-Medrol and diuresis.  We are obtaining records from her outpatient physician.  Bartholomew Crews, MD 02/20/2019, 4:16 PM

## 2019-02-21 ENCOUNTER — Encounter: Payer: Self-pay | Admitting: Diagnostic Neuroimaging

## 2019-02-21 DIAGNOSIS — I5030 Unspecified diastolic (congestive) heart failure: Secondary | ICD-10-CM | POA: Diagnosis not present

## 2019-02-21 DIAGNOSIS — M549 Dorsalgia, unspecified: Secondary | ICD-10-CM | POA: Diagnosis not present

## 2019-02-21 DIAGNOSIS — J9601 Acute respiratory failure with hypoxia: Secondary | ICD-10-CM | POA: Diagnosis not present

## 2019-02-21 DIAGNOSIS — J9621 Acute and chronic respiratory failure with hypoxia: Secondary | ICD-10-CM | POA: Diagnosis not present

## 2019-02-21 DIAGNOSIS — G8929 Other chronic pain: Secondary | ICD-10-CM | POA: Diagnosis not present

## 2019-02-21 LAB — BASIC METABOLIC PANEL
Anion gap: 16 — ABNORMAL HIGH (ref 5–15)
BUN: 27 mg/dL — ABNORMAL HIGH (ref 8–23)
CO2: 23 mmol/L (ref 22–32)
Calcium: 9.5 mg/dL (ref 8.9–10.3)
Chloride: 100 mmol/L (ref 98–111)
Creatinine, Ser: 0.41 mg/dL — ABNORMAL LOW (ref 0.44–1.00)
GFR calc Af Amer: >60 mL/min (ref >60)
GFR calc non Af Amer: >60 mL/min (ref >60)
Glucose, Bld: 127 mg/dL — ABNORMAL HIGH (ref 70–99)
Potassium: 4.1 mmol/L (ref 3.5–5.1)
Sodium: 139 mmol/L (ref 135–145)

## 2019-02-21 LAB — CBC WITH DIFFERENTIAL/PLATELET
Abs Immature Granulocytes: 0.12 10*3/uL — ABNORMAL HIGH (ref 0.00–0.07)
Basophils Absolute: 0 10*3/uL (ref 0.0–0.1)
Basophils Relative: 0 %
Eosinophils Absolute: 0 10*3/uL (ref 0.0–0.5)
Eosinophils Relative: 0 %
HCT: 48.8 % — ABNORMAL HIGH (ref 36.0–46.0)
Hemoglobin: 16.3 g/dL — ABNORMAL HIGH (ref 12.0–15.0)
Immature Granulocytes: 1 %
Lymphocytes Relative: 7 %
Lymphs Abs: 1.1 10*3/uL (ref 0.7–4.0)
MCH: 31.1 pg (ref 26.0–34.0)
MCHC: 33.4 g/dL (ref 30.0–36.0)
MCV: 93.1 fL (ref 80.0–100.0)
Monocytes Absolute: 0.6 10*3/uL (ref 0.1–1.0)
Monocytes Relative: 4 %
Neutro Abs: 13.8 10*3/uL — ABNORMAL HIGH (ref 1.7–7.7)
Neutrophils Relative %: 88 %
Platelets: 341 10*3/uL (ref 150–400)
RBC: 5.24 MIL/uL — ABNORMAL HIGH (ref 3.87–5.11)
RDW: 12.8 % (ref 11.5–15.5)
WBC: 15.6 10*3/uL — ABNORMAL HIGH (ref 4.0–10.5)
nRBC: 0 % (ref 0.0–0.2)

## 2019-02-21 LAB — GLUCOSE, CAPILLARY
Glucose-Capillary: 128 mg/dL — ABNORMAL HIGH (ref 70–99)
Glucose-Capillary: 132 mg/dL — ABNORMAL HIGH (ref 70–99)
Glucose-Capillary: 163 mg/dL — ABNORMAL HIGH (ref 70–99)

## 2019-02-21 LAB — MAGNESIUM: Magnesium: 2.2 mg/dL (ref 1.7–2.4)

## 2019-02-21 LAB — PROCALCITONIN: Procalcitonin: <0.1 ng/mL

## 2019-02-21 LAB — ANCA TITERS
Atypical P-ANCA titer: <1:20 {titer}
C-ANCA: <1:20 {titer}
P-ANCA: <1:20 {titer}

## 2019-02-21 NOTE — Progress Notes (Signed)
Subjective:    Julia Massey was seen and evaluated at bedside on morning rounds. No acute events overnight. She is doing much better, and mentions that she slept a lot last night.   Plan discussed with patient and her questions were addressed. She is open and agreeable to see the rheumatologist after discharge.   Objective:  Vital signs in last 24 hours: Vitals:   02/21/19 0234 02/21/19 0419 02/21/19 0427 02/21/19 0600  BP:  (!) 133/99    Pulse:  87    Resp:  19 (!) 22   Temp:  97.8 F (36.6 C)    TempSrc:  Oral    SpO2: 91% 98%    Weight:    51 kg  Height:        Physical Exam Vitals signs and nursing note reviewed.  Constitutional:      General: She is not in acute distress.    Appearance: She is well-developed. She is not toxic-appearing.  Cardiovascular:     Rate and Rhythm: Normal rate and regular rhythm.     Heart sounds: No murmur.  Pulmonary:     Effort: Tachypnea present. No accessory muscle usage.     Breath sounds: Examination of the right-lower field reveals rales. Examination of the left-lower field reveals rales. Rales (improving since yesterday though) present. No decreased breath sounds, wheezing or rhonchi.  Abdominal:     General: Bowel sounds are normal.     Palpations: Abdomen is soft.  Skin:    General: Skin is warm and dry.     Findings: No ecchymosis or rash.  Neurological:     General: No focal deficit present.     Mental Status: She is alert and oriented to person, place, and time.  Psychiatric:        Mood and Affect: Mood normal.        Behavior: Behavior normal.    Assessment/Plan:  Active Problems:   Acute respiratory failure with hypoxemia (HCC)  # Acute hypoxic respiratory failure  CTA on 10/12 was notable for bibasilar honeycombing, diffuse ground glass opacities, enlarged right ventricle and pulmonary arteries consistent with pulmonary artery hypertension and chronic changes consistent with interstitial lung disease. PCCM was  consulted and notes this could be ILD. Rheumatologic workup notable for elevated sed rate and positive RF, rest of results were negative. Given her history of possible RA appearing bony changes in the hands (will obtain xray to confirm, pending still), it is possible a history of untreated and progressive RA lead to a worsening in ILD. Likely ILD sequela includes PAH and right heart failure.   She has been doing well with HFNC around 10-12L. Slight bump in WBC, likely from steroid use. Continue supportive care.   - PCCM on board and we appreciate their recommendations - Telemetry - HFNC @ 10L - Duoneb TID, per RT - Solumedrol 60mg  q6h  # Heart Failure  Last echo in 2017 showed grade 1 diastolic dysfunction with preserved EF at 55-60%. BNP on this admission was 1170. TTE on 101/12 showed right ventricle has severely reduced systolic function with moderate pulmonary hypertension. Will need to be careful regarding volume depletion given dependence on preload.   - Telemetry - Strict I/Os  - Daily weights - Lasix 20mg  daily  # Chronic Back Pain:  Restarted home Norco.   - Norco 5-325mg , 1-2 tablets q6h PRN  # Hx of Depression: Continue Prozac   Dispo: Anticipated discharge pending clinical improvement.   Dr. Jose Persia  Internal Medicine PGY-1  Pager: [phone] 02/21/2019, 6:43 AM

## 2019-02-21 NOTE — TOC Progression Note (Signed)
Transition of Care [facility]) - Progression Note    Patient Details  Name: Julia Massey MRN: [id number] Date of Birth: [birth year]  Transition of Care [facility]) CM/SW [place], [place] Phone Number: [phone] 02/21/2019, 2:01 PM  Clinical Narrative:     CSW informed resident that [facility] requesting home health PT orders to prepare for patient when medically stable to dc, resident reports she will not input home health PT orders until tomorrow. CSW has informed [facility] of this.   Expected Discharge Plan: [place] Barriers to Discharge: Continued Medical Work up  Expected Discharge Plan and Services Expected Discharge Plan: [place] arrangements for the past 2 months: Single Family Home                           HH Arranged: PT           Social Determinants of Health (SDOH) Interventions    Readmission Risk Interventions No flowsheet data found.

## 2019-02-21 NOTE — Care Management Important Message (Signed)
Important Message  Patient Details  Name: Julia Massey MRN: [id number] Date of Birth: September 01, 1950   Medicare Important Message Given:  Yes     [name] 02/21/2019, 2:24 PM

## 2019-02-21 NOTE — Progress Notes (Signed)
PT refused CPT. 

## 2019-02-21 NOTE — Progress Notes (Signed)
  Date: 02/21/2019  Patient name: Julia Massey  Medical record number: [id number]  Date of birth: [birth year]        I have seen and evaluated this patient and I have discussed the plan of care with the house staff. Please see their note for complete details. I concur with their findings with the following additions/corrections: [name] was seen this morning on team rounds.  She continues to improve and her respiratory status with decreased tachypnea, work of breathing, and accessory muscle usage.  She is still requiring supplemental oxygen and is currently at 10 L high flow nasal cannula with O2 sats in the 90s.  She has fewer rales in the bases today.  Pulmonology is continuing furosemide at 20 orally daily.  They are also managing her steroids.  She is working with PT who has recommended home health PT.  Bartholomew Crews, MD 02/21/2019, 2:42 PM

## 2019-02-21 NOTE — Progress Notes (Signed)
NAME:  Julia Massey, MRN:  [id number], DOB:  [birth year], LOS: 3 ADMISSION DATE:  02/28/2019, CONSULTATION DATE:  02/19/19 REFERRING MD:  Valli Glance IMTS, CHIEF COMPLAINT:  Acute on chronic respiratory failure  Brief History   68 yo F Hx COPD  Respiratory failure  History of present illness   68 yo F PMH COPD, chronic pain grade I dialstolic dysfunction who presented to ED 10/11 with CC SOB. Myalgias, malaise, nightsweats began 2 days prior to presentation. Patient began taking leftover levaquin from prior infection x 2 doses. Additionally she took [employer], as well as home albuterol. On Sunday, patient with significantly worse SOB. Denied fever, cough, sore throat, rhinitis, HA, n/v/d, dizziness, changes in appetite.   Patient presents with leukocytosos 18.8. In ED she was given duonebs ans started on abx + steroids. COVID-19 negative. Patient placed on NRB with SpO2 92% Presenting ABG: 7.446/ 26/55/18/19/91   Admitted to IMTS and started on BiPAP overnight. Remains on BiPAP this morning.  PCCM asked to see 10/12.  Past Medical History  COPD Carpal Tunnel Chronic back pain Gout HLD Diastolic heart dysfunction   Significant Hospital Events   10/11 admitted 10/12 PCCM consulted. PCCM reduced O2 on BiPAP from 100 to 60   Consults:  PCCM  Procedures:   Significant Diagnostic Tests:  10/11 CXR> Chronic interstitial lung disease with new R sided opacities.  10/12 ECHO >>> 10/12 CT angio chest> No PE. enlarged heart, no pericardial effusion. Diffuse ground-glass appearance throughout lungs. No Pleural Effusion. Emphysematous changes.  10/13 ECHO> 1. Left ventricular ejection fraction, by visual estimation, is 55 to 60%. The left ventricle has normal function. Normal left ventricular size. There is no left ventricular hypertrophy.  2. Left ventricular diastolic Doppler parameters are consistent with impaired relaxation pattern of LV diastolic filling.  3. Global right ventricle  has severely reduced systolic function.The right ventricular size is mildly enlarged.  4. Left atrial size was mildly dilated.  5. Right atrial size was normal.  6. The mitral valve is normal in structure. Trace mitral valve regurgitation. No evidence of mitral stenosis.  7. The tricuspid valve is normal in structure. Tricuspid valve regurgitation is mild.  8. The aortic valve is tricuspid Aortic valve regurgitation was not visualized by color flow Doppler. Structurally normal aortic valve, with no evidence of sclerosis or stenosis.  9. The pulmonic valve was not well visualized. Pulmonic valve regurgitation is not visualized by color flow Doppler. 10. Moderately elevated pulmonary artery systolic pressure. 11. The inferior vena cava is dilated in size with >50% respiratory variability, suggesting right atrial pressure of 8 mmHg. 12. Normal LV systolic function; grade 1 diastolic dysfunction; mild LAE; mild RVE with severe RV dysfunction; cannot R/O thrombus in RV on apical images; suggest CTA to further assess; mild TR; moderate pulmonary hypertension.  10/13 CXR> Chronic diffuse bilateral interstitial prominence, with some interval improvement-- likely improved pulmonary edema   Micro Data:  10/11 SARS CoV2> neg  10/12 RVP neg  Antimicrobials:  Azithroymycin 10/11>> Rocephin 10/11>>  Interim history/subjective:  On oxygen supplementation Supplementation being weaned uncomfortable Improving  Objective   Blood pressure (!) 159/107, pulse 86, temperature 97.8 F (36.6 C), temperature source Oral, resp. rate 18, height 5' (1.524 m), weight 51 kg, SpO2 96 %.        Intake/Output Summary (Last 24 hours) at 02/21/2019 1522 Last data filed at 02/21/2019 1108 Gross per 24 hour  Intake 582 ml  Output 750 ml  Net -168 ml  Filed Weights   02/19/19 0109 02/20/19 0430 02/21/19 0600  Weight: 50.7 kg 51.6 kg 51 kg    Examination: General: Frail, does not appear to be in distress  HENT: Moist oral mucosa Lungs: Rhonchi bilaterally Cardiovascular: S1-S2 appreciated Abdomen: Soft, bowel sounds appreciated Extremities: No edema, no clubbing Neuro:AAOx4 following commands  Psych: Calm, pleasant, cooperative   Resolved Hospital Problem list     Assessment & Plan:   Marland Kitchen  Acute on chronic respiratory failure with hypoxemia -Continue to wean as tolerated -Decrease oxygen supplementation to maintain saturations greater than 90% -May be kept off BiPAP  Chronic obstructive pulmonary disease with exacerbation -We will continue bronchodilators -Continue steroids  Pulmonary hypertension -Moderate pulmonary hypertension on echocardiogram -Repeat echocardiogram when more stable -Does have underlying diastolic heart failure -Chronic obstructive lung disease may also be contributing  Pulmonary edema -Improving with diuresis -Secondary to diastolic heart failure with decompensation  Possible interstitial lung disease -Follow serologies  -Follow renal parameters -Avoid nephrotoxic's -Taper down steroids -  Once stable enough will need follow-up with rheumatology and pulmonology as outpatient  Rest per primary   Best practice:  Diet: advancing to liquids per primary.  Pain/Anxiety/Delirium protocol (if indicated): APAP VAP protocol (if indicated): na DVT prophylaxis: lovenox GI prophylaxis: na  Glucose control: na Mobility: Recommend mobilization as tolerated  Code Status: Full  Family Communication: patient updated  Disposition: Progressive  ABG    Component Value Date/Time   PHART 7.472 (H) 02/19/2019 0315   PCO2ART 31.5 (L) 02/19/2019 0315   PO2ART 75.9 (L) 02/19/2019 0315   HCO3 22.8 02/19/2019 0315   TCO2 19 (L) 03/04/2019 1253   ACIDBASEDEF 0.4 02/19/2019 0315   O2SAT 95.3 02/19/2019 0315    Virl Diamond, MD [phone]

## 2019-02-21 NOTE — Progress Notes (Signed)
Physical Therapy Treatment Patient Details Name: Julia Massey MRN: [id number] DOB: 1950/10/23 Today's Date: 02/21/2019    History of Present Illness Pt is a 68 y/o female admitted secondary to acute hypoxic respiratory failure likely secondary to acute pneumonitis. Pt placed on BiPap which has since been removed. PMH includes COPD and gout.     PT Comments    Pt with slow progression towards goals. Pt's RN requesting to defer OOB mobility given fluctuating saturations, so session focused on LE HEP. Initially sats fluctuating between 85-90% on 11L HFNC. However, had pt incorporate pursed lip breathing when performing HEP, and sats increasing to 90%-93% on 11L HFNC. Instructed pt about performing HEP throughout the day with incorporation of breathing tasks. Anticipate pt will progress well once oxygen sats stable. Will continue to follow acutely to maximize functional mobility independence and safety.     Follow Up Recommendations  Home health PT(pending progress )     Equipment Recommendations  Other (comment)(TBD)    Recommendations for Other Services       Precautions / Restrictions Precautions Precautions: Fall;Other (comment) Precaution Comments: Watch O2 sats Restrictions Weight Bearing Restrictions: No    Mobility  Bed Mobility               General bed mobility comments: Pt's RN requesting to defer OOB mobility and work on supine HEP this session.   Transfers                    Ambulation/Gait                 Stairs             Wheelchair Mobility    Modified Rankin (Stroke Patients Only)       Balance                                            Cognition Arousal/Alertness: Awake/alert Behavior During Therapy: WFL for tasks assessed/performed Overall Cognitive Status: Within Functional Limits for tasks assessed                                        Exercises General Exercises - Upper  Extremity Shoulder Flexion: AROM;Both;10 reps;Supine(incorporated pursed lip breathing with exercise) General Exercises - Lower Extremity Ankle Circles/Pumps: AROM;Both;20 reps;Supine Quad Sets: AROM;Both;10 reps;Supine Heel Slides: AROM;Both;10 reps;Supine Hip ABduction/ADduction: AROM;Both;10 reps;Supine    General Comments General comments (skin integrity, edema, etc.): Pt's oxygen sats fluctuating between 85-93% on 11L HFNC during exercises. Sats improved when incorporating breathing tasks within exercises.       Pertinent Vitals/Pain Pain Assessment: No/denies pain    Home Living                      Prior Function            PT Goals (current goals can now be found in the care plan section) Acute Rehab PT Goals Patient Stated Goal: to be able to breathe better PT Goal Formulation: With patient Time For Goal Achievement: 02/16/2019 Potential to Achieve Goals: Good Progress towards PT goals: Progressing toward goals(slowly)    Frequency    Min 3X/week      PT Plan Current plan remains appropriate    Co-evaluation  AM-PAC PT "6 Clicks" Mobility   Outcome Measure  Help needed turning from your back to your side while in a flat bed without using bedrails?: A Little Help needed moving from lying on your back to sitting on the side of a flat bed without using bedrails?: A Little Help needed moving to and from a bed to a chair (including a wheelchair)?: A Little Help needed standing up from a chair using your arms (e.g., wheelchair or bedside chair)?: A Little Help needed to walk in hospital room?: A Little Help needed climbing 3-5 steps with a railing? : A Lot 6 Click Score: 17    End of Session Equipment Utilized During Treatment: Gait belt Activity Tolerance: Treatment limited secondary to medical complications (Comment) Patient left: in bed;with call bell/phone within reach Nurse Communication: Mobility status PT Visit Diagnosis:  Muscle weakness (generalized) (M62.81);Difficulty in walking, not elsewhere classified (R26.2)     Time: 5732-2025 PT Time Calculation (min) (ACUTE ONLY): 14 min  Charges:  $Therapeutic Exercise: 8-22 mins                     Leighton Ruff, PT, DPT  Acute Rehabilitation Services  Pager: [phone] Office: [phone]    Rudean Hitt 02/21/2019, 6:02 PM

## 2019-02-22 DIAGNOSIS — J9601 Acute respiratory failure with hypoxia: Secondary | ICD-10-CM | POA: Diagnosis not present

## 2019-02-22 DIAGNOSIS — G8929 Other chronic pain: Secondary | ICD-10-CM | POA: Diagnosis not present

## 2019-02-22 DIAGNOSIS — I2729 Other secondary pulmonary hypertension: Secondary | ICD-10-CM | POA: Diagnosis not present

## 2019-02-22 DIAGNOSIS — M549 Dorsalgia, unspecified: Secondary | ICD-10-CM | POA: Diagnosis not present

## 2019-02-22 DIAGNOSIS — I5030 Unspecified diastolic (congestive) heart failure: Secondary | ICD-10-CM | POA: Diagnosis not present

## 2019-02-22 DIAGNOSIS — D751 Secondary polycythemia: Secondary | ICD-10-CM | POA: Diagnosis not present

## 2019-02-22 DIAGNOSIS — I272 Pulmonary hypertension, unspecified: Secondary | ICD-10-CM | POA: Diagnosis not present

## 2019-02-22 DIAGNOSIS — J849 Interstitial pulmonary disease, unspecified: Secondary | ICD-10-CM

## 2019-02-22 DIAGNOSIS — M069 Rheumatoid arthritis, unspecified: Secondary | ICD-10-CM | POA: Diagnosis not present

## 2019-02-22 DIAGNOSIS — R69 Illness, unspecified: Secondary | ICD-10-CM | POA: Diagnosis not present

## 2019-02-22 DIAGNOSIS — I110 Hypertensive heart disease with heart failure: Secondary | ICD-10-CM | POA: Diagnosis not present

## 2019-02-22 LAB — BASIC METABOLIC PANEL
Anion gap: 12 (ref 5–15)
BUN: 24 mg/dL — ABNORMAL HIGH (ref 8–23)
CO2: 26 mmol/L (ref 22–32)
Calcium: 9.4 mg/dL (ref 8.9–10.3)
Chloride: 98 mmol/L (ref 98–111)
Creatinine, Ser: 0.51 mg/dL (ref 0.44–1.00)
GFR calc Af Amer: >60 mL/min (ref >60)
GFR calc non Af Amer: >60 mL/min (ref >60)
Glucose, Bld: 129 mg/dL — ABNORMAL HIGH (ref 70–99)
Potassium: 4 mmol/L (ref 3.5–5.1)
Sodium: 136 mmol/L (ref 135–145)

## 2019-02-22 LAB — CBC WITH DIFFERENTIAL/PLATELET
Abs Immature Granulocytes: 0.08 10*3/uL — ABNORMAL HIGH (ref 0.00–0.07)
Basophils Absolute: 0 10*3/uL (ref 0.0–0.1)
Basophils Relative: 0 %
Eosinophils Absolute: 0 10*3/uL (ref 0.0–0.5)
Eosinophils Relative: 0 %
HCT: 50.2 % — ABNORMAL HIGH (ref 36.0–46.0)
Hemoglobin: 16.3 g/dL — ABNORMAL HIGH (ref 12.0–15.0)
Immature Granulocytes: 1 %
Lymphocytes Relative: 7 %
Lymphs Abs: 1 10*3/uL (ref 0.7–4.0)
MCH: 30.3 pg (ref 26.0–34.0)
MCHC: 32.5 g/dL (ref 30.0–36.0)
MCV: 93.3 fL (ref 80.0–100.0)
Monocytes Absolute: 1 10*3/uL (ref 0.1–1.0)
Monocytes Relative: 8 %
Neutro Abs: 11.3 10*3/uL — ABNORMAL HIGH (ref 1.7–7.7)
Neutrophils Relative %: 84 %
Platelets: 360 10*3/uL (ref 150–400)
RBC: 5.38 MIL/uL — ABNORMAL HIGH (ref 3.87–5.11)
RDW: 12.5 % (ref 11.5–15.5)
WBC: 13.4 10*3/uL — ABNORMAL HIGH (ref 4.0–10.5)
nRBC: 0 % (ref 0.0–0.2)

## 2019-02-22 LAB — GLUCOSE, CAPILLARY
Glucose-Capillary: 120 mg/dL — ABNORMAL HIGH (ref 70–99)
Glucose-Capillary: 140 mg/dL — ABNORMAL HIGH (ref 70–99)

## 2019-02-22 MED ORDER — PREDNISONE 20 MG PO TABS
40.0000 mg | ORAL_TABLET | Freq: Every day | ORAL | Status: DC
  Administered 2019-02-22 – 2019-02-26 (×5): 40 mg via ORAL
  Filled 2019-02-22 (×5): qty 2

## 2019-02-22 NOTE — Progress Notes (Addendum)
Patient DBP running in upper 90s to low 100s.  imts notified.   imts returned page-stated to continue monitoring patient.  They are aware of patient BP

## 2019-02-22 NOTE — Progress Notes (Signed)
Subjective:    Julia Massey states she is continuing to do well today. She is resting well but does feel a bit sleepy today. She denies worsening in SOB. She thinks her elevated BP may be due to the steroids.   Updated family friend Lynden Ang today as requested by Mrs. Fountain.   Objective:  Vital signs in last 24 hours: Vitals:   02/22/19 0053 02/22/19 0327 02/22/19 0331 02/22/19 0739  BP: (!) 147/99  (!) 154/100 (!) 141/99  Pulse:   85 89  Resp:   (!) 24 14  Temp:   98.1 F (36.7 C) 98.3 F (36.8 C)  TempSrc:   Oral Oral  SpO2:   90% 92%  Weight:  51.5 kg    Height:        Physical Exam Vitals signs and nursing note reviewed.  Constitutional:      General: She is not in acute distress.    Appearance: She is well-developed. She is not toxic-appearing.  Cardiovascular:     Rate and Rhythm: Normal rate and regular rhythm.     Heart sounds: No murmur.  Pulmonary:     Effort: Tachypnea present. No accessory muscle usage or respiratory distress.     Breath sounds: Examination of the right-middle field reveals rales. Examination of the left-middle field reveals rales. Examination of the right-lower field reveals rales. Examination of the left-lower field reveals rales. Rales (worsened since yesterday) present. No decreased breath sounds, wheezing or rhonchi.  Abdominal:     General: Bowel sounds are normal.     Palpations: Abdomen is soft.  Skin:    General: Skin is warm and dry.     Findings: No ecchymosis or rash.  Neurological:     General: No focal deficit present.     Mental Status: She is alert and oriented to person, place, and time.  Psychiatric:        Mood and Affect: Mood normal.        Behavior: Behavior normal.    Assessment/Plan:  Active Problems:   Acute respiratory failure with hypoxemia (HCC)  # Acute hypoxic respiratory failure  # Pulmonary Hypertension # Interstitial Lung Disease 2/2 RA CTA on 10/12 was notable for bibasilar honeycombing, diffuse  ground glass opacities, enlarged right ventricle and pulmonary arteries consistent with pulmonary artery hypertension and chronic changes consistent with interstitial lung disease. PCCM was consulted and notes this could be ILD. Rheumatologic workup notable for elevated sed rate and positive RF, rest of serology were negative. Given her history of possible RA appearing bony changes in the hands (will obtain xray to confirm, pending still), it is possible a history of untreated and progressive RA lead to a worsening in ILD. Likely ILD sequela includes PAH and right heart failure.   Continues to do well with HFNC around 10L. No weaning of oxygen planned for today. WBC has come down a bit, but expect it will stay on the higher end while we continue high dose steroids. PCCM have not yet seen patient, so will continue with current dose of Solumedrol, although would consider tapering down soon.   - PCCM on board and we appreciate their recommendations - Telemetry - HFNC @ 10L - Duoneb TID, per RT - Solumedrol 60mg  q6h  # Heart Failure  Last echo in 2017 showed grade 1 diastolic dysfunction with preserved EF at 55-60%. BNP on this admission was 1170. TTE on 10/12 showed right ventricle has severely reduced systolic function with moderate pulmonary hypertension. Will  need to be careful regarding volume depletion given dependence on preload. PCCM started Lasix 20mg  for 3 days with the last day being today, will look into whether this needs to be continued. She does have a worsening in crackles today, but she did receive her Lasix dose. Could be due to improved po intake.   - Telemetry - Strict I/Os  - Daily weights  #Elevated BP:  BP over the past day has begun to be elevated compared to her BP on admission, with systolic around 672-094 and diastolic around 709. Possibly secondary to steroids. For now, will just monitor closely, no medication intervention indicated. Although, I am sure the Lasix is  helping some.   # Secondary Polycythemia  Likely secondary to chronic hypoxia with hx of chronic lung disease. Hemoglobin trends around 16. Will continue to monitor.   # Chronic Back Pain:  Restarted home Norco.   - Norco 5-325mg , 1-2 tablets q6h PRN  # Hx of Depression: Continue Prozac   Dispo: Anticipated discharge pending clinical improvement.   Dr. Jose Persia Internal Medicine PGY-1  Pager: [phone] 02/22/2019, 9:48 AM

## 2019-02-22 NOTE — Progress Notes (Signed)
  Date: 02/22/2019  Patient name: Julia Massey  Medical record number: [id number]  Date of birth: 28-May-1950        I have seen and evaluated this patient and I have discussed the plan of care with the house staff. Please see their note for complete details. I concur with their findings with the following additions/corrections: [name] was seen this morning on team rounds.  Compared to yesterday, she has slightly increased respiratory distress with increased tachypnea, work of breathing, and rails.  She is also up 0.5 kg compared to yesterday despite Lasix 20 mg IV daily.  She has not been able to be weaned off the oxygen.  We may need more aggressive diuresis tomorrow despite being preload dependent.  Will discuss steroid tapering with pulmonology.  Bartholomew Crews, MD 02/22/2019, 3:16 PM

## 2019-02-22 NOTE — Progress Notes (Signed)
Physical Therapy Treatment Patient Details Name: Julia Massey MRN: [id number] DOB: Jan 29, 1951 Today's Date: 02/22/2019    History of Present Illness Pt is a 68 y/o female admitted secondary to acute hypoxic respiratory failure likely secondary to acute pneumonitis. Pt placed on BiPap which has since been removed. PMH includes COPD and gout.     PT Comments    Pt progressing towards goals, however, remains limited secondary to SOB and fluctuating oxygen sats. Was able to stand X3 and performed marching tasks. Oxygen sats fluctuating between 85-90% on 10L of HFNC. Anticipate pt will continue to progress as breathing and saturations improve. Will continue to follow acutely to maximize functional mobility independence and safety.     Follow Up Recommendations  Home health PT;Other (comment)(pending progress)     Equipment Recommendations  Rolling walker with 5" wheels    Recommendations for Other Services       Precautions / Restrictions Precautions Precautions: Fall;Other (comment) Precaution Comments: Watch O2 sats Restrictions Weight Bearing Restrictions: No    Mobility  Bed Mobility Overal bed mobility: Needs Assistance Bed Mobility: Supine to Sit;Sit to Supine     Supine to sit: Supervision Sit to supine: Supervision   General bed mobility comments: Supervision for safety. required seated rest upon sitting to slow breathing.   Transfers Overall transfer level: Needs assistance Equipment used: Rolling walker (2 wheeled) Transfers: Sit to/from Stand Sit to Stand: Min assist;Min guard         General transfer comment: Performed sit<>stand X 3 this session. Initially requiring min A, however, decreased to min guard for safety with practice. Practiced marching tasks for pre-gait in standing. Oxygen sats fluctuating between 85-90% on 10L of HFNC.   Ambulation/Gait                 Stairs             Wheelchair Mobility    Modified Rankin (Stroke  Patients Only)       Balance Overall balance assessment: Needs assistance Sitting-balance support: No upper extremity supported;Feet supported Sitting balance-Leahy Scale: Fair     Standing balance support: Bilateral upper extremity supported;During functional activity Standing balance-Leahy Scale: Poor Standing balance comment: Reliant on BUE support                             Cognition Arousal/Alertness: Awake/alert Behavior During Therapy: WFL for tasks assessed/performed Overall Cognitive Status: Within Functional Limits for tasks assessed                                        Exercises General Exercises - Lower Extremity Hip Flexion/Marching: AROM;Both;20 reps;Standing(10 X 2)    General Comments        Pertinent Vitals/Pain Pain Assessment: No/denies pain    Home Living                      Prior Function            PT Goals (current goals can now be found in the care plan section) Acute Rehab PT Goals Patient Stated Goal: to be able to breathe better PT Goal Formulation: With patient Time For Goal Achievement: 03/02/2019 Potential to Achieve Goals: Good Progress towards PT goals: Progressing toward goals    Frequency    Min 3X/week      PT  Plan Equipment recommendations need to be updated    Co-evaluation              AM-PAC PT "6 Clicks" Mobility   Outcome Measure  Help needed turning from your back to your side while in a flat bed without using bedrails?: A Little Help needed moving from lying on your back to sitting on the side of a flat bed without using bedrails?: A Little Help needed moving to and from a bed to a chair (including a wheelchair)?: A Little Help needed standing up from a chair using your arms (e.g., wheelchair or bedside chair)?: A Little Help needed to walk in hospital room?: A Little Help needed climbing 3-5 steps with a railing? : A Lot 6 Click Score: 17    End of Session  Equipment Utilized During Treatment: Gait belt Activity Tolerance: Treatment limited secondary to medical complications (Comment) Patient left: in bed;with call bell/phone within reach Nurse Communication: Mobility status PT Visit Diagnosis: Muscle weakness (generalized) (M62.81);Difficulty in walking, not elsewhere classified (R26.2)     Time: 2831-5176 PT Time Calculation (min) (ACUTE ONLY): 21 min  Charges:  $Therapeutic Activity: 8-22 mins                     Leighton Ruff, PT, DPT  Acute Rehabilitation Services  Pager: [phone] Office: [phone]    Rudean Hitt 02/22/2019, 4:22 PM

## 2019-02-22 NOTE — Progress Notes (Signed)
NAME:  Julia Massey, MRN:  [id number], DOB:  1951/02/25, LOS: 4 ADMISSION DATE:  03/16/19, CONSULTATION DATE:  02/19/19 REFERRING MD:  Daron Offer IMTS, CHIEF COMPLAINT:  Acute on chronic respiratory failure  Brief History   68 yo F Hx COPD  Respiratory failure  History of present illness   68 yo F PMH COPD, chronic pain grade I dialstolic dysfunction who presented to ED 10/11 with CC SOB. Myalgias, malaise, nightsweats began 2 days prior to presentation. Patient began taking leftover levaquin from prior infection x 2 doses. Additionally she took Cisco, as well as home albuterol. On Sunday, patient with significantly worse SOB. Denied fever, cough, sore throat, rhinitis, HA, n/v/d, dizziness, changes in appetite.   Patient presents with leukocytosos 18.8. In ED she was given duonebs ans started on abx + steroids. COVID-19 negative. Patient placed on NRB with SpO2 92% Presenting ABG: 7.446/ 26/55/18/19/91   Admitted to IMTS and started on BiPAP. PCCM asked to see 10/12.  Past Medical History  COPD Carpal Tunnel Chronic back pain Gout HLD Diastolic heart dysfunction   Significant Hospital Events   10/11 admitted 10/12 PCCM consulted.  Consults:  PCCM  Procedures:   Significant Diagnostic Tests:  10/11 CXR> Chronic interstitial lung disease with new R sided opacities.  10/12 ECHO >>> 10/12 CT angio chest> No PE. enlarged heart, no pericardial effusion. Diffuse ground-glass appearance throughout lungs. No Pleural Effusion. Emphysematous changes.  10/13 ECHO> 1. Left ventricular ejection fraction, by visual estimation, is 55 to 60%. The left ventricle has normal function. Normal left ventricular size. There is no left ventricular hypertrophy.  2. Left ventricular diastolic Doppler parameters are consistent with impaired relaxation pattern of LV diastolic filling.  3. Global right ventricle has severely reduced systolic function.The right ventricular size is mildly  enlarged.  4. Left atrial size was mildly dilated.  5. Right atrial size was normal.  6. The mitral valve is normal in structure. Trace mitral valve regurgitation. No evidence of mitral stenosis.  7. The tricuspid valve is normal in structure. Tricuspid valve regurgitation is mild.  8. The aortic valve is tricuspid Aortic valve regurgitation was not visualized by color flow Doppler. Structurally normal aortic valve, with no evidence of sclerosis or stenosis.  9. The pulmonic valve was not well visualized. Pulmonic valve regurgitation is not visualized by color flow Doppler. 10. Moderately elevated pulmonary artery systolic pressure. 11. The inferior vena cava is dilated in size with >50% respiratory variability, suggesting right atrial pressure of 8 mmHg. 12. Normal LV systolic function; grade 1 diastolic dysfunction; mild LAE; mild RVE with severe RV dysfunction; cannot R/O thrombus in RV on apical images; suggest CTA to further assess; mild TR; moderate pulmonary hypertension.  10/13 CXR> Chronic diffuse bilateral interstitial prominence, with some interval improvement-- likely improved pulmonary edema   Micro Data:  10/11 SARS CoV2> neg  10/12 RVP neg  Antimicrobials:  Azithroymycin 10/11-10/13 Rocephin 10/11-10/13  Interim history/subjective:  On oxygen supplementation Supplementation being weaned uncomfortable Improving  Objective   Blood pressure (!) 142/84, pulse 85, temperature 98.3 F (36.8 C), temperature source Oral, resp. rate 14, height 5' (1.524 m), weight 51.5 kg, SpO2 97 %.        Intake/Output Summary (Last 24 hours) at 02/22/2019 1619 Last data filed at 02/22/2019 0845 Gross per 24 hour  Intake 462 ml  Output 925 ml  Net -463 ml   Filed Weights   02/20/19 0430 02/21/19 0600 02/22/19 0327  Weight: 51.6 kg 51  kg 51.5 kg    Examination: General: Frail, appears comfortable HENT: Moist oral mucosa Lungs: Fair air entry bilaterally anteriorly  Cardiovascular: S1-S2 appreciated Abdomen: Bowel sounds appreciated Extremities: No clubbing Neuro:AAOx4 following commands   Resolved Hospital Problem list     Assessment & Plan:   Marland Kitchen  Acute on chronic respiratory failure with hypoxemia -Continue oxygen segmentation -Discontinue BiPAP   Chronic obstructive pulmonary disease with -Continue bronchodilator -Continue steroids -Switch to oral steroids  Pulmonary hypertension -Moderate pulmonary hypertension on echocardiogram -Diastolic heart failure -Underlying obstructive lung disease -PFT when more stable  Possible interstitial lung disease -Follow serologies -Pulmonary follow-up -Rheumatologic follow-up as outpatient  Follow renal parameters  Rest per primary  Best practice:  Diet: advancing to liquids per primary.  Pain/Anxiety/Delirium protocol (if indicated): APAP VAP protocol (if indicated): na DVT prophylaxis: lovenox GI prophylaxis: na  Glucose control: na Mobility: Recommend mobilization as tolerated  Code Status: Full  Family Communication: patient updated  Disposition: Progressive  ABG    Component Value Date/Time   PHART 7.472 (H) 02/19/2019 0315   PCO2ART 31.5 (L) 02/19/2019 0315   PO2ART 75.9 (L) 02/19/2019 0315   HCO3 22.8 02/19/2019 0315   TCO2 19 (L) 02/23/2019 1253   ACIDBASEDEF 0.4 02/19/2019 0315   O2SAT 95.3 02/19/2019 0315    Sherrilyn Rist, MD [phone]

## 2019-02-23 DIAGNOSIS — J849 Interstitial pulmonary disease, unspecified: Secondary | ICD-10-CM | POA: Diagnosis not present

## 2019-02-23 DIAGNOSIS — I2729 Other secondary pulmonary hypertension: Secondary | ICD-10-CM | POA: Diagnosis not present

## 2019-02-23 DIAGNOSIS — J9601 Acute respiratory failure with hypoxia: Secondary | ICD-10-CM | POA: Diagnosis not present

## 2019-02-23 LAB — CBC WITH DIFFERENTIAL/PLATELET
Abs Immature Granulocytes: 0.12 10*3/uL — ABNORMAL HIGH (ref 0.00–0.07)
Basophils Absolute: 0 10*3/uL (ref 0.0–0.1)
Basophils Relative: 0 %
Eosinophils Absolute: 0 10*3/uL (ref 0.0–0.5)
Eosinophils Relative: 0 %
HCT: 48.9 % — ABNORMAL HIGH (ref 36.0–46.0)
Hemoglobin: 16.4 g/dL — ABNORMAL HIGH (ref 12.0–15.0)
Immature Granulocytes: 1 %
Lymphocytes Relative: 7 %
Lymphs Abs: 1 10*3/uL (ref 0.7–4.0)
MCH: 30.9 pg (ref 26.0–34.0)
MCHC: 33.5 g/dL (ref 30.0–36.0)
MCV: 92.3 fL (ref 80.0–100.0)
Monocytes Absolute: 1.7 10*3/uL — ABNORMAL HIGH (ref 0.1–1.0)
Monocytes Relative: 12 %
Neutro Abs: 12.1 10*3/uL — ABNORMAL HIGH (ref 1.7–7.7)
Neutrophils Relative %: 80 %
Platelets: 322 10*3/uL (ref 150–400)
RBC: 5.3 MIL/uL — ABNORMAL HIGH (ref 3.87–5.11)
RDW: 12.3 % (ref 11.5–15.5)
WBC: 15 10*3/uL — ABNORMAL HIGH (ref 4.0–10.5)
nRBC: 0 % (ref 0.0–0.2)

## 2019-02-23 LAB — BASIC METABOLIC PANEL
Anion gap: 14 (ref 5–15)
BUN: 18 mg/dL (ref 8–23)
CO2: 25 mmol/L (ref 22–32)
Calcium: 8.9 mg/dL (ref 8.9–10.3)
Chloride: 95 mmol/L — ABNORMAL LOW (ref 98–111)
Creatinine, Ser: 0.46 mg/dL (ref 0.44–1.00)
GFR calc Af Amer: >60 mL/min (ref >60)
GFR calc non Af Amer: >60 mL/min (ref >60)
Glucose, Bld: 111 mg/dL — ABNORMAL HIGH (ref 70–99)
Potassium: 3.7 mmol/L (ref 3.5–5.1)
Sodium: 134 mmol/L — ABNORMAL LOW (ref 135–145)

## 2019-02-23 LAB — CULTURE, BLOOD (ROUTINE X 2)
Culture: NO GROWTH
Culture: NO GROWTH
Special Requests: ADEQUATE
Special Requests: ADEQUATE

## 2019-02-23 LAB — GLUCOSE, CAPILLARY
Glucose-Capillary: 106 mg/dL — ABNORMAL HIGH (ref 70–99)
Glucose-Capillary: 108 mg/dL — ABNORMAL HIGH (ref 70–99)
Glucose-Capillary: 182 mg/dL — ABNORMAL HIGH (ref 70–99)

## 2019-02-23 MED ORDER — HYDRALAZINE HCL 25 MG PO TABS: 25.0000 mg | ORAL_TABLET | Freq: Once | ORAL | Status: DC | PRN

## 2019-02-23 NOTE — Progress Notes (Signed)
NAME:  Julia Massey, MRN:  [id number], DOB:  November 26, 1950, LOS: 5 ADMISSION DATE:  02-Mar-2019, CONSULTATION DATE:  02/19/19 REFERRING MD:  Daron Offer IMTS, CHIEF COMPLAINT:  Acute on chronic respiratory failure  Brief History   68 yo F Hx COPD  Respiratory failure  History of present illness   68 yo F PMH COPD, chronic pain grade I dialstolic dysfunction who presented to ED 10/11 with CC SOB. Myalgias, malaise, nightsweats began 2 days prior to presentation. Patient began taking leftover levaquin from prior infection x 2 doses. Additionally she took Cisco, as well as home albuterol. On Sunday, patient with significantly worse SOB. Denied fever, cough, sore throat, rhinitis, HA, n/v/d, dizziness, changes in appetite.   Patient presents with leukocytosos 18.8. In ED she was given duonebs ans started on abx + steroids. COVID-19 negative. Patient placed on NRB with SpO2 92% Presenting ABG: 7.446/ 26/55/18/19/91   Admitted to IMTS and started on BiPAP. PCCM asked to see 10/12.  Past Medical History  COPD Carpal Tunnel Chronic back pain Gout HLD Diastolic heart dysfunction   Significant Hospital Events   10/11 admitted 10/12 PCCM consulted.  Consults:  PCCM  Procedures:   Significant Diagnostic Tests:  10/11 CXR> Chronic interstitial lung disease with new R sided opacities.  10/12 ECHO >>> 10/12 CT angio chest> No PE. enlarged heart, no pericardial effusion. Diffuse ground-glass appearance throughout lungs. No Pleural Effusion. Emphysematous changes.  10/13 ECHO> 1. Left ventricular ejection fraction, by visual estimation, is 55 to 60%. The left ventricle has normal function. Normal left ventricular size. There is no left ventricular hypertrophy.  2. Left ventricular diastolic Doppler parameters are consistent with impaired relaxation pattern of LV diastolic filling.  3. Global right ventricle has severely reduced systolic function.The right ventricular size is mildly  enlarged.  4. Left atrial size was mildly dilated.  5. Right atrial size was normal.  6. The mitral valve is normal in structure. Trace mitral valve regurgitation. No evidence of mitral stenosis.  7. The tricuspid valve is normal in structure. Tricuspid valve regurgitation is mild.  8. The aortic valve is tricuspid Aortic valve regurgitation was not visualized by color flow Doppler. Structurally normal aortic valve, with no evidence of sclerosis or stenosis.  9. The pulmonic valve was not well visualized. Pulmonic valve regurgitation is not visualized by color flow Doppler. 10. Moderately elevated pulmonary artery systolic pressure. 11. The inferior vena cava is dilated in size with >50% respiratory variability, suggesting right atrial pressure of 8 mmHg. 12. Normal LV systolic function; grade 1 diastolic dysfunction; mild LAE; mild RVE with severe RV dysfunction; cannot R/O thrombus in RV on apical images; suggest CTA to further assess; mild TR; moderate pulmonary hypertension.  10/13 CXR> Chronic diffuse bilateral interstitial prominence, with some interval improvement-- likely improved pulmonary edema   Micro Data:  10/11 SARS CoV2> neg  10/12 RVP neg  Antimicrobials:  Azithroymycin 10/11-10/13 Rocephin 10/11-10/13  Interim history/subjective:  On oxygen supplementation Had a difficult night Concerned about her blood pressure  Objective   Blood pressure (!) 155/95, pulse (!) 50, temperature 98.5 F (36.9 C), temperature source Oral, resp. rate 17, height 5' (1.524 m), weight 47.6 kg, SpO2 99 %.        Intake/Output Summary (Last 24 hours) at 02/23/2019 0757 Last data filed at 02/22/2019 2155 Gross per 24 hour  Intake 720 ml  Output 700 ml  Net 20 ml   Filed Weights   02/22/19 0327 02/23/19 0500 02/23/19 0549  Weight: 51.5 kg 47.6 kg 47.6 kg    Examination: General: Frail, comfortable HENT: Moist oral mucosa Lungs: Fair air entry bilaterally, Velcro rales at the bases  Cardiovascular: S1-S2 appreciated Abdomen: Bowel sounds appreciated Extremities: No clubbing Neuro: Alert and oriented x3  Resolved Hospital Problem list     Assessment & Plan:  .  Interstitial lung disease .  Rheumatoid factor positive -Rheumatology follow-up -Further work-up of ILD will include high-resolution CT, will delay high-resolution CT till she is adequately diuresed and CT scan picture will not be clouded by groundglass changes which may be secondary to decompensated heart failure -She will also require pulmonary function testing when more stable  .  Acute on chronic respiratory failure with hypoxemia -Continue oxygen supplementation -disontinue BiPAP  .  Chronic obstructive pulmonary disease -Bronchodilators -Continue steroids -Wean down oral steroids -Short course of tapering steroids-does not need to extend for more than 5 to 7 days   Pulmonary hypertension -Moderate pulmonary hypertension on echocardiogram -Diastolic heart failure with recent decompensation -Underlying obstructive lung disease -Repeat echo when more stable -We will need a right heart catheterization if degree of pulmonary hypertension is felt to be out of contact with underlying heart and lung disease  Rest per primary -Discussed with primary service on rounds today  Discharge arrangements may start -She will require some rehab -Rheumatology follow-up -Pulmonary follow-up  Transient hypertension may be related to being on steroids  Best practice:  Diet: Per primary Pain/Anxiety/Delirium protocol (if indicated): APAP VAP protocol (if indicated): na DVT prophylaxis: lovenox Mobility: Recommend mobilization as tolerated  Code Status: Full  Family Communication: patient updated  Disposition: Progressive    Sherrilyn Rist, MD [phone]

## 2019-02-23 NOTE — Progress Notes (Signed)
RN notified Dr. Marva Panda via Shea Evans, concerning Pt recent blood pressure.  RN will continue to monitor.  No PRN medication for blood pressure during this time.  Pt denies any headache.  PRN pain medication given, for generalized pain.

## 2019-02-23 NOTE — Progress Notes (Addendum)
Subjective:    Julia Massey reports she is not feeling as good today as she was concerned about her BP being elevated. She denies having HTN at home and has never required anti-hypertensives. She notes that her breathing is slowly starting to improve and she has been able to get more work done with PT.   We discussed her BP and that although elevated, it is not currently requiring medication intervention, especially considering she was normotensive at home. Likely secondary to steroids and she states she was aware of that side effect. Informed that we watch her vitals regularly and are always aware of how her BP is doing. She felt better knowing that. We also discussed her TTE results and that her right heart failure could improve as we treat her lung disease and RA.   Objective:  Vital signs in last 24 hours: Vitals:   02/23/19 0105 02/23/19 0500 02/23/19 0549 02/23/19 0553  BP: (!) 170/80   (!) 161/101  Pulse:    71  Resp:    20  Temp:    98.1 F (36.7 C)  TempSrc:    Oral  SpO2:      Weight:  47.6 kg 47.6 kg   Height:        Physical Exam Vitals signs and nursing note reviewed.  Constitutional:      General: She is not in acute distress.    Appearance: She is well-developed. She is not toxic-appearing.  Cardiovascular:     Rate and Rhythm: Normal rate and regular rhythm.     Heart sounds: No murmur.  Pulmonary:     Effort: Tachypnea present. No accessory muscle usage or respiratory distress.     Breath sounds: Examination of the right-middle field reveals rales. Examination of the left-middle field reveals rales. Examination of the right-lower field reveals rales. Examination of the left-lower field reveals rales. Rales (dry, improving) present. No decreased breath sounds, wheezing or rhonchi.  Abdominal:     General: Bowel sounds are normal.     Palpations: Abdomen is soft.  Skin:    General: Skin is warm and dry.     Findings: No ecchymosis or rash.  Neurological:   General: No focal deficit present.     Mental Status: She is alert and oriented to person, place, and time.  Psychiatric:        Mood and Affect: Mood normal.        Behavior: Behavior normal.    Assessment/Plan:  Active Problems:   Acute respiratory failure with hypoxemia (HCC)  # Acute hypoxic respiratory failure  # Pulmonary Hypertension # Interstitial Lung Disease 2/2 RA CTA on 10/12 was notable for bibasilar honeycombing, diffuse ground glass opacities, enlarged right ventricle and pulmonary arteries consistent with pulmonary artery hypertension and chronic changes consistent with interstitial lung disease. PCCM was consulted and notes this could be ILD. Rheumatologic workup notable for elevated sed rate and positive RF, rest of serology were negative. Given her history of possible RA appearing bony changes in the hands (will obtain xray to confirm, pending still), it is possible a history of untreated and progressive RA lead to a worsening in ILD. Likely ILD sequela includes PAH and right heart failure.   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 Will need close follow up with rheumatology on discharge. She has been switched to Prednisone 40mg  and will taper in the next couple days to 30mg , then 20mg . PCCM recommends  a short course overall. She continues to do well with HFNC and we will wean as tolerated.   High resolution CT recommended by PCCM once pulmonary edema from HF resolves. She is down on weight today and her crackles have become more dry in nature. She may be able to have this sooner than later.   - PCCM on board and we appreciate their recommendations - Telemetry - HFNC @ 10L - Wean as tolerated - Duoneb TID, per RT - Prednisone 40mg  QD  # Heart Failure  Last echo in 2017 showed grade 1 diastolic dysfunction with preserved EF at 55-60%. BNP on this admission was 1170. TTE on 10/12 showed right ventricle  has severely reduced systolic function with moderate pulmonary hypertension. Will need to be careful regarding volume depletion given dependence on preload. Will need to repeat TTE when tachypnea improves, per PCCM recommendations. Today, her weight is down, so will continue holding Lasix.   - Telemetry - Strict I/Os  - Daily weights  #Elevated BP:  BP continues to elevated to the systolic 170s on average. Likely secondary to steroids. Added hydralazine PRN once, but will not likely need long-term anti-hypertensives, as we are tapering her steroids.  - Hydralazine 25mg  PRN once for BP >180/110 on at least 2 readings, inform MD before administration   # Secondary Polycythemia  Likely secondary to chronic hypoxia with hx of chronic lung disease. Hemoglobin trends around 16. Will continue to monitor.   # Chronic Back Pain:  Restarted home Norco.   - Norco 5-325mg , 1-2 tablets q6h PRN  # Hx of Depression: Continue Prozac   Dispo: Anticipated discharge pending clinical improvement.   Dr. 12/12 Internal Medicine PGY-1  Pager: [phone] 02/23/2019, 7:12 AM

## 2019-02-23 NOTE — Progress Notes (Signed)
PT Cancellation Note  Patient Details Name: Julia Massey MRN: [id number] DOB: 01/02/1951   Cancelled Treatment:    Reason Eval/Treat Not Completed: Medical issues which prohibited therapy Pt reporting increased nausea and requesting to hold PT. Will follow up as schedule allows.   Leighton Ruff, PT, DPT  Acute Rehabilitation Services  Pager: [phone] Office: [phone]    Rudean Hitt 02/23/2019, 11:52 AM

## 2019-02-24 ENCOUNTER — Telehealth: Payer: Self-pay | Admitting: Pulmonary Disease

## 2019-02-24 LAB — BASIC METABOLIC PANEL
Anion gap: 12 (ref 5–15)
BUN: 17 mg/dL (ref 8–23)
CO2: 28 mmol/L (ref 22–32)
Calcium: 9.3 mg/dL (ref 8.9–10.3)
Chloride: 94 mmol/L — ABNORMAL LOW (ref 98–111)
Creatinine, Ser: 0.48 mg/dL (ref 0.44–1.00)
GFR calc Af Amer: >60 mL/min (ref >60)
GFR calc non Af Amer: >60 mL/min (ref >60)
Glucose, Bld: 98 mg/dL (ref 70–99)
Potassium: 4.3 mmol/L (ref 3.5–5.1)
Sodium: 134 mmol/L — ABNORMAL LOW (ref 135–145)

## 2019-02-24 LAB — CBC WITH DIFFERENTIAL/PLATELET
Abs Immature Granulocytes: 0.18 10*3/uL — ABNORMAL HIGH (ref 0.00–0.07)
Basophils Absolute: 0 10*3/uL (ref 0.0–0.1)
Basophils Relative: 0 %
Eosinophils Absolute: 0.2 10*3/uL (ref 0.0–0.5)
Eosinophils Relative: 1 %
HCT: 53.2 % — ABNORMAL HIGH (ref 36.0–46.0)
Hemoglobin: 17.9 g/dL — ABNORMAL HIGH (ref 12.0–15.0)
Immature Granulocytes: 1 %
Lymphocytes Relative: 14 %
Lymphs Abs: 2.2 10*3/uL (ref 0.7–4.0)
MCH: 31.2 pg (ref 26.0–34.0)
MCHC: 33.6 g/dL (ref 30.0–36.0)
MCV: 92.7 fL (ref 80.0–100.0)
Monocytes Absolute: 1.2 10*3/uL — ABNORMAL HIGH (ref 0.1–1.0)
Monocytes Relative: 7 %
Neutro Abs: 12.7 10*3/uL — ABNORMAL HIGH (ref 1.7–7.7)
Neutrophils Relative %: 77 %
Platelets: 324 10*3/uL (ref 150–400)
RBC: 5.74 MIL/uL — ABNORMAL HIGH (ref 3.87–5.11)
RDW: 12.2 % (ref 11.5–15.5)
WBC: 16.6 10*3/uL — ABNORMAL HIGH (ref 4.0–10.5)
nRBC: 0 % (ref 0.0–0.2)

## 2019-02-24 LAB — GLUCOSE, CAPILLARY
Glucose-Capillary: 97 mg/dL (ref 70–99)
Glucose-Capillary: 142 mg/dL — ABNORMAL HIGH (ref 70–99)

## 2019-02-24 NOTE — Progress Notes (Signed)
NAME:  LATESHA CHESNEY, MRN:  [id number], DOB:  1950/10/26, LOS: 6 ADMISSION DATE:  02/22/2019, CONSULTATION DATE:  02/19/19 REFERRING MD:  Daron Offer IMTS, CHIEF COMPLAINT:  Acute on chronic respiratory failure  Brief History   68 yo F Hx COPD  Respiratory failure  History of present illness   68 yo F PMH COPD, chronic pain grade I dialstolic dysfunction who presented to ED 10/11 with CC SOB. Myalgias, malaise, nightsweats began 2 days prior to presentation. Patient began taking leftover levaquin from prior infection x 2 doses. Additionally she took [employer], as well as home albuterol. On Sunday, patient with significantly worse SOB. Denied fever, cough, sore throat, rhinitis, HA, n/v/d, dizziness, changes in appetite.   Patient presents with leukocytosos 18.8. In ED she was given duonebs ans started on abx + steroids. COVID-19 negative. Patient placed on NRB with SpO2 92% Presenting ABG: 7.446/ 26/55/18/19/91   Admitted to IMTS and started on BiPAP. PCCM asked to see 10/12.  Past Medical History  COPD Carpal Tunnel Chronic back pain Gout HLD Diastolic heart dysfunction   Significant Hospital Events   10/11 admitted 10/12 PCCM consulted.  Consults:  PCCM  Procedures:   Significant Diagnostic Tests:  10/11 CXR> Chronic interstitial lung disease with new R sided opacities.  10/12 ECHO >>> 10/12 CT angio chest> No PE. enlarged heart, no pericardial effusion. Diffuse ground-glass appearance throughout lungs. No Pleural Effusion. Emphysematous changes.  10/13 ECHO> 1. Left ventricular ejection fraction, by visual estimation, is 55 to 60%. The left ventricle has normal function. Normal left ventricular size. There is no left ventricular hypertrophy.  2. Left ventricular diastolic Doppler parameters are consistent with impaired relaxation pattern of LV diastolic filling.  3. Global right ventricle has severely reduced systolic function.The right ventricular size is mildly  enlarged.  4. Left atrial size was mildly dilated.  5. Right atrial size was normal.  6. The mitral valve is normal in structure. Trace mitral valve regurgitation. No evidence of mitral stenosis.  7. The tricuspid valve is normal in structure. Tricuspid valve regurgitation is mild.  8. The aortic valve is tricuspid Aortic valve regurgitation was not visualized by color flow Doppler. Structurally normal aortic valve, with no evidence of sclerosis or stenosis.  9. The pulmonic valve was not well visualized. Pulmonic valve regurgitation is not visualized by color flow Doppler. 10. Moderately elevated pulmonary artery systolic pressure. 11. The inferior vena cava is dilated in size with >50% respiratory variability, suggesting right atrial pressure of 8 mmHg. 12. Normal LV systolic function; grade 1 diastolic dysfunction; mild LAE; mild RVE with severe RV dysfunction; cannot R/O thrombus in RV on apical images; suggest CTA to further assess; mild TR; moderate pulmonary hypertension.  10/13 CXR> Chronic diffuse bilateral interstitial prominence, with some interval improvement-- likely improved pulmonary edema   Micro Data:  10/11 SARS CoV2> neg  10/12 RVP neg  Antimicrobials:  Azithroymycin 10/11-10/13 Rocephin 10/11-10/13  Interim history/subjective:  On oxygen supplementation but reports improved work of breathing. Reports being tired.    Objective   Blood pressure 131/82, pulse 91, temperature 98.2 F (36.8 C), temperature source Oral, resp. rate 18, height 5' (1.524 m), weight 48 kg, SpO2 95 %.    FiO2 (%):  [60 %] 60 %   Intake/Output Summary (Last 24 hours) at 02/24/2019 1032 Last data filed at 02/24/2019 0811 Gross per 24 hour  Intake 1080 ml  Output 1200 ml  Net -120 ml   Filed Weights   02/23/19  0500 02/23/19 0549 02/24/19 0601  Weight: 47.6 kg 47.6 kg 48 kg    Examination: General: Frail, comfortable HENT: Moist oral mucosa Lungs: Fair air entry bilaterally, Velcro  rales at the bases Cardiovascular: S1-S2 appreciated Abdomen: Bowel sounds appreciated Extremities: No clubbing Neuro: Alert and oriented x3  Resolved Hospital Problem list     Assessment & Plan:  .  Interstitial lung disease .  Rheumatoid factor positive -Rheumatology follow-up -Further work-up of ILD will include high-resolution CT, will delay high-resolution CT till she is adequately diuresed and CT scan picture will not be clouded by groundglass changes which may be secondary to decompensated heart failure -She will also require pulmonary function testing when more stable  .  Acute on chronic respiratory failure with hypoxemia -Continue oxygen supplementation - BiPAP on standby  .  Chronic obstructive pulmonary disease -Bronchodilators -Continue steroids -Wean down oral steroids -Short course of tapering steroids-does not need to extend for more than 5 to 7 days   Pulmonary hypertension -Moderate pulmonary hypertension on echocardiogram -Diastolic heart failure with recent decompensation -Underlying obstructive lung disease -Repeat echo when more stable -We will need a right heart catheterization if degree of pulmonary hypertension is felt to be out of contact with underlying heart and lung disease  Rest per primary -Discussed with primary service on rounds today  Discharge arrangements may start -She will require some rehab -Rheumatology follow-up -Pulmonary follow-up  Transient hypertension may be related to being on steroids  Best practice:  Diet: Per primary Pain/Anxiety/Delirium protocol (if indicated): APAP VAP protocol (if indicated): na DVT prophylaxis: lovenox Mobility: Recommend mobilization as tolerated  Code Status: Full  Family Communication: patient updated  Disposition: Progressive   Will continue to follow every few days  Bonna Gains, MD PhD 02/24/19 10:33 AM

## 2019-02-24 NOTE — Telephone Encounter (Signed)
Please arrange for patient to be seen in 2weeks to 1 month by Dr. Ginger Organ, NP-C [place] Pgr: [phone] or if no answer [phone] 02/24/2019, 10:35 AM

## 2019-02-24 NOTE — Plan of Care (Signed)
  Problem: Nutrition: Goal: Adequate nutrition will be maintained Outcome: Completed/Met   Problem: Elimination: Goal: Will not experience complications related to bowel motility Outcome: Completed/Met Goal: Will not experience complications related to urinary retention Outcome: Completed/Met

## 2019-02-24 NOTE — Progress Notes (Signed)
   Subjective: Patient was seen and evaluated at bedside. She had some headache but it is better now.  Objective:  Vital signs in last 24 hours: Vitals:   02/24/19 0051 02/24/19 0414 02/24/19 0601 02/24/19 0811  BP: (!) 134/95  (!) 140/109 131/82  Pulse: (!) 102 99 (!) 106 91  Resp: 18 18 20 18   Temp: 98.1 F (36.7 C)  98 F (36.7 C) 98.2 F (36.8 C)  TempSrc:   Oral Oral  SpO2: 93% 96% (!) 88% 99%  Weight:   48 kg   Height:       Physical Exam  Constitutional:  No acute distress.  Cardiovascular: RRR, nl S1S2, no murmur,  no LEE Respiratory: On [place], normal effort, no wheezing today, has crackles GI: Soft. Bowel sounds are normal. No distension. There is no tenderness.  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 Patient had  leukocytosos 18.8. In ED she was given duonebs ans started on abx + steroids. COVID-19 negative. Patient placed on NRB with SpO2 92% Presenting ABG: 7.446/ 26/55/18/19/91 required BiPAP on fist night. Initially treated for COPD exacerbation. Imaging was more suggestive of ILD, she has + RF.PCCM has been on board.  ow on [place] 10 li/m S/P IV steroid. Also received Lasix. Now on PO Prednisone Symptoms improved.  -PCCM has been on board and kindly following. Appreciete recommendation. -Will delay high-resolution CT till she is adequately diuresed and CT scan picture will not be clouded by groundglass changes which may be secondary to decompensated heart failure -She will also require pulmonary function testing when more stable -Wean O2 as tolerates (goal to keep O2 sat>85%) -Repeat Echo when stable -Continue bronchodilators  -Continue Po prednisone -Will need f/u with rheum out patient -May consider low dose lasix  if develops more crackles.   New onset HTN: Likely 2/2 steroid Today BP is better at 130/80 -Will monitor  Dispo: Anticipated discharge in approximately 3-4 days  Dewayne Hatch, MD 02/24/2019, 8:17 AM Pager: [phone]

## 2019-02-25 DIAGNOSIS — I10 Essential (primary) hypertension: Secondary | ICD-10-CM | POA: Diagnosis not present

## 2019-02-25 DIAGNOSIS — I110 Hypertensive heart disease with heart failure: Secondary | ICD-10-CM | POA: Diagnosis not present

## 2019-02-25 DIAGNOSIS — J9601 Acute respiratory failure with hypoxia: Secondary | ICD-10-CM | POA: Diagnosis not present

## 2019-02-25 DIAGNOSIS — I5030 Unspecified diastolic (congestive) heart failure: Secondary | ICD-10-CM | POA: Diagnosis not present

## 2019-02-25 DIAGNOSIS — J449 Chronic obstructive pulmonary disease, unspecified: Secondary | ICD-10-CM

## 2019-02-25 DIAGNOSIS — J849 Interstitial pulmonary disease, unspecified: Secondary | ICD-10-CM | POA: Diagnosis not present

## 2019-02-25 DIAGNOSIS — J939 Pneumothorax, unspecified: Secondary | ICD-10-CM | POA: Diagnosis not present

## 2019-02-25 DIAGNOSIS — I272 Pulmonary hypertension, unspecified: Secondary | ICD-10-CM

## 2019-02-25 DIAGNOSIS — M069 Rheumatoid arthritis, unspecified: Secondary | ICD-10-CM | POA: Diagnosis not present

## 2019-02-25 DIAGNOSIS — G8929 Other chronic pain: Secondary | ICD-10-CM | POA: Diagnosis not present

## 2019-02-25 LAB — GLUCOSE, CAPILLARY
Glucose-Capillary: 118 mg/dL — ABNORMAL HIGH (ref 70–99)
Glucose-Capillary: 120 mg/dL — ABNORMAL HIGH (ref 70–99)
Glucose-Capillary: 124 mg/dL — ABNORMAL HIGH (ref 70–99)

## 2019-02-25 LAB — CREATININE, SERUM
Creatinine, Ser: 0.66 mg/dL (ref 0.44–1.00)
GFR calc Af Amer: >60 mL/min (ref >60)
GFR calc non Af Amer: >60 mL/min (ref >60)

## 2019-02-25 MED ORDER — CALCIUM CARBONATE ANTACID 500 MG PO CHEW
1.0000 | CHEWABLE_TABLET | Freq: Three times a day (TID) | ORAL | Status: DC | PRN
  Administered 2019-02-25 – 2019-03-05 (×6): 200 mg via ORAL
  Filled 2019-02-25 (×6): qty 1

## 2019-02-25 NOTE — Progress Notes (Signed)
Internal Medicine Teaching Service Attending:   I saw and examined the patient. I reviewed the resident's note and I agree with the resident's findings and plan as documented in the resident's note.  Active Problems:   Acute respiratory failure with hypoxemia [facility])  Hospital day #7 for this 68 year old woman admitted with acute hypoxic respiratory failure due to ILD. She is on 15 LPM of [place] this morning, she has no LE edema, no JVD, and dry crackles throughout her exam. She is tachypneic and speaks in 4-5 word sentences. ILD is being medically managed with prednisone. Team has been working on weaning supplemental oxygen but it has been a slow process, mostly oweing to significant burden of disease.  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 Would try an earlobe reading which may give a better waveform. She has an acapella valve, but not much sputum or chest congestion. Please bring an incentive spirometer as she needs better pulmonary rehab. Needs more activity out of bed, please take meals in the chair. Likely this will be a slow process, but she will need to be able to tolerate 4-6 LPM of supplemental oxygen with activity before she will be safe to discharge to home or SNF.  Lalla Brothers, MD FACP

## 2019-02-25 NOTE — Progress Notes (Signed)
   Subjective: Patient was seen and evaluated at bedside. She mentions that she had a hard night and had some difficulty breathing. She is able to answers our questions well. No other complaint.   Objective:  Vital signs in last 24 hours: Vitals:   02/25/19 0814 02/25/19 0832 02/25/19 1157 02/25/19 1509  BP: (!) 130/95  118/89   Pulse: 96  95   Resp: 15  (!) 22   Temp: 97.9 F (36.6 C)  98 F (36.7 C)   TempSrc: Oral  Oral   SpO2: 95% 97% 90% 91%  Weight:      Height:       Physical Exam  Constitutional: Appears uncomfortable and anxious but in no acute distress.  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 Patient had  leukocytosos 18.8. In ED she was given duonebs ans started on abx + steroids. COVID-19 negative. Patient placed on NRB with SpO2 92% Presenting ABG: 7.446/ 26/55/18/19/91 required BiPAP on fist night. Initially treated for COPD exacerbation. Imaging was more suggestive of ILD, she has + RF.PCCM has been on board.  O2 sat 77-90 during the encounter. She had increased O2 requirement last nigh and is now on [place] 15 li/m. The pulse oxymetry does not show good waves and her fingers are cold. We suspect this is not an accurate reading. -I talked to RN and asked to switch the pulse ox detector tape to ear lobe.   No incentive spirometry in room. Reordered. patient's nurse to provide one and educate the patient to use it.  -Incentive spirometry -Continue Po prednisone 40 mgQD -Continue bronchodilators -No lasix at this point -Will  monitor patient's sympoms -PCCM has been on board and following every few days now. Appreciete recommendation. -Will delay high-resolution CT till she is adequately diuresed and CT scan picture will not be clouded by groundglass changes which may be secondary to decompensated heart failure -She will also require pulmonary function testing when more stable -Wean O2 as tolerates (goal to keep O2 sat>85%) -Repeat Echo when stable -Will need f/u with rheum out patient  New onset HTN: Likely 2/2 steroid resolved -Will monitor  Dispo: Anticipated discharge in approximately 3-4 days  Dewayne Hatch, MD 02/25/2019, 4:01 PM Pager: [phone]

## 2019-02-25 NOTE — Plan of Care (Signed)
  Problem: Skin Integrity: Goal: Risk for impaired skin integrity will decrease Outcome: Completed/Met

## 2019-02-26 ENCOUNTER — Inpatient Hospital Stay (HOSPITAL_COMMUNITY): Payer: Medicare HMO

## 2019-02-26 DIAGNOSIS — I10 Essential (primary) hypertension: Secondary | ICD-10-CM | POA: Diagnosis not present

## 2019-02-26 DIAGNOSIS — M069 Rheumatoid arthritis, unspecified: Secondary | ICD-10-CM | POA: Diagnosis not present

## 2019-02-26 DIAGNOSIS — G8929 Other chronic pain: Secondary | ICD-10-CM | POA: Diagnosis not present

## 2019-02-26 DIAGNOSIS — M549 Dorsalgia, unspecified: Secondary | ICD-10-CM | POA: Diagnosis not present

## 2019-02-26 DIAGNOSIS — J9601 Acute respiratory failure with hypoxia: Secondary | ICD-10-CM | POA: Diagnosis not present

## 2019-02-26 DIAGNOSIS — R69 Illness, unspecified: Secondary | ICD-10-CM | POA: Diagnosis not present

## 2019-02-26 DIAGNOSIS — J849 Interstitial pulmonary disease, unspecified: Secondary | ICD-10-CM | POA: Diagnosis not present

## 2019-02-26 DIAGNOSIS — J8410 Pulmonary fibrosis, unspecified: Secondary | ICD-10-CM | POA: Diagnosis not present

## 2019-02-26 DIAGNOSIS — I50810 Right heart failure, unspecified: Secondary | ICD-10-CM | POA: Diagnosis not present

## 2019-02-26 DIAGNOSIS — D751 Secondary polycythemia: Secondary | ICD-10-CM | POA: Diagnosis not present

## 2019-02-26 DIAGNOSIS — I272 Pulmonary hypertension, unspecified: Secondary | ICD-10-CM

## 2019-02-26 LAB — CBC
HCT: 51.5 % — ABNORMAL HIGH (ref 36.0–46.0)
Hemoglobin: 17.1 g/dL — ABNORMAL HIGH (ref 12.0–15.0)
MCH: 30.5 pg (ref 26.0–34.0)
MCHC: 33.2 g/dL (ref 30.0–36.0)
MCV: 92 fL (ref 80.0–100.0)
Platelets: 391 10*3/uL (ref 150–400)
RBC: 5.6 MIL/uL — ABNORMAL HIGH (ref 3.87–5.11)
RDW: 12 % (ref 11.5–15.5)
WBC: 18.7 10*3/uL — ABNORMAL HIGH (ref 4.0–10.5)
nRBC: 0 % (ref 0.0–0.2)

## 2019-02-26 LAB — BASIC METABOLIC PANEL
Anion gap: 13 (ref 5–15)
BUN: 15 mg/dL (ref 8–23)
CO2: 24 mmol/L (ref 22–32)
Calcium: 9.3 mg/dL (ref 8.9–10.3)
Chloride: 97 mmol/L — ABNORMAL LOW (ref 98–111)
Creatinine, Ser: 0.54 mg/dL (ref 0.44–1.00)
GFR calc Af Amer: >60 mL/min (ref >60)
GFR calc non Af Amer: >60 mL/min (ref >60)
Glucose, Bld: 111 mg/dL — ABNORMAL HIGH (ref 70–99)
Potassium: 4.2 mmol/L (ref 3.5–5.1)
Sodium: 134 mmol/L — ABNORMAL LOW (ref 135–145)

## 2019-02-26 LAB — ECHOCARDIOGRAM LIMITED
Height: 60 in
Weight: 1700.19 oz

## 2019-02-26 LAB — GLUCOSE, CAPILLARY
Glucose-Capillary: 113 mg/dL — ABNORMAL HIGH (ref 70–99)
Glucose-Capillary: 125 mg/dL — ABNORMAL HIGH (ref 70–99)

## 2019-02-26 MED ORDER — POTASSIUM CHLORIDE CRYS ER 20 MEQ PO TBCR
20.0000 meq | EXTENDED_RELEASE_TABLET | Freq: Once | ORAL | Status: AC
  Administered 2019-02-26: 14:00:00 20 meq via ORAL
  Filled 2019-02-26: qty 1

## 2019-02-26 MED ORDER — PREDNISONE 20 MG PO TABS
30.0000 mg | ORAL_TABLET | Freq: Every day | ORAL | Status: DC
  Administered 2019-02-27 – 2019-03-01 (×3): 30 mg via ORAL
  Filled 2019-02-26 (×3): qty 1

## 2019-02-26 MED ORDER — FUROSEMIDE 10 MG/ML IJ SOLN
40.0000 mg | Freq: Once | INTRAMUSCULAR | Status: AC
  Administered 2019-02-26: 14:00:00 40 mg via INTRAVENOUS
  Filled 2019-02-26: qty 4

## 2019-02-26 MED ORDER — PERFLUTREN LIPID MICROSPHERE
INTRAVENOUS | Status: AC
  Administered 2019-02-26: 4 mL via INTRAVENOUS
  Filled 2019-02-26: qty 10

## 2019-02-26 NOTE — Progress Notes (Signed)
  Echocardiogram 2D Echocardiogram has been performed.  Julia Massey 02/26/2019, 12:14 PM

## 2019-02-26 NOTE — Progress Notes (Signed)
Internal Medicine Attending:   I saw and examined the patient. I reviewed the resident's note and I agree with the resident's findings and plan as documented in the resident's note.  Patient states that her shortness of breath is improving and that she feels better today.  Patient initially was admitted to the hospital with acute hypoxic respiratory failure secondary to interstitial lung disease.  Repeat CT chest done today showed findings compatible with UIP.  This is possibly secondary to underlying RA versus IPF per pulmonary.  Continue with steroids for now.  Repeat 2D echo done today but was unable to assess pulmonary care pressure.  May need to consider right heart cath for assessment of pulmonary arterial hypertension.  Patient does continue to desat with minimal movement.  Continue with high flow nasal cannula as needed.  Continue duo nebs 3 times daily.  No further work-up at this time.  We will continue to monitor closely.  Patient will need outpatient follow-up with rheumatology for this.  I also think the patient would benefit from palliative care consult to address goals of care with the patient.  Will discuss this with the patient tomorrow.

## 2019-02-26 NOTE — Progress Notes (Signed)
NAME:  Julia Massey, MRN:  [id number], DOB:  10-Nov-1950, LOS: 34 ADMISSION DATE:  02/12/2019, CONSULTATION DATE:  02/19/19 REFERRING MD:  Valli Glance IMTS, CHIEF COMPLAINT:  Acute on chronic respiratory failure  Brief History   68 yo F Hx COPD  Respiratory failure  History of present illness   68 yo F PMH COPD, chronic pain grade I dialstolic dysfunction who presented to ED 10/11 with CC SOB. Myalgias, malaise, nightsweats began 2 days prior to presentation. Patient began taking leftover levaquin from prior infection x 2 doses. Additionally she took [employer], as well as home albuterol. On Sunday, patient with significantly worse SOB. Denied fever, cough, sore throat, rhinitis, HA, n/v/d, dizziness, changes in appetite.   Patient presents with leukocytosos 18.8. In ED she was given duonebs ans started on abx + steroids. COVID-19 negative. Patient placed on NRB with SpO2 92% Presenting ABG: 7.446/ 26/55/18/19/91   Admitted to IMTS and started on BiPAP. PCCM asked to see 10/12.  Past Medical History  COPD Carpal Tunnel Chronic back pain Gout HLD Diastolic heart dysfunction   Significant Hospital Events   10/11 admitted 10/12 PCCM consulted.  Consults:  PCCM  Procedures:   Significant Diagnostic Tests:  10/11 CXR> Chronic interstitial lung disease with new R sided opacities.  10/12 ECHO >>> 10/12 CT angio chest> No PE. enlarged heart, no pericardial effusion. Diffuse ground-glass appearance throughout lungs. No Pleural Effusion. Emphysematous changes.  10/13 ECHO> 1. Left ventricular ejection fraction, by visual estimation, is 55 to 60%. The left ventricle has normal function. Normal left ventricular size. There is no left ventricular hypertrophy.  2. Left ventricular diastolic Doppler parameters are consistent with impaired relaxation pattern of LV diastolic filling.  3. Global right ventricle has severely reduced systolic function.The right ventricular size is mildly  enlarged.  4. Left atrial size was mildly dilated.  5. Right atrial size was normal.  6. The mitral valve is normal in structure. Trace mitral valve regurgitation. No evidence of mitral stenosis.  7. The tricuspid valve is normal in structure. Tricuspid valve regurgitation is mild.  8. The aortic valve is tricuspid Aortic valve regurgitation was not visualized by color flow Doppler. Structurally normal aortic valve, with no evidence of sclerosis or stenosis.  9. The pulmonic valve was not well visualized. Pulmonic valve regurgitation is not visualized by color flow Doppler. 10. Moderately elevated pulmonary artery systolic pressure. 11. The inferior vena cava is dilated in size with >50% respiratory variability, suggesting right atrial pressure of 8 mmHg. 12. Normal LV systolic function; grade 1 diastolic dysfunction; mild LAE; mild RVE with severe RV dysfunction; cannot R/O thrombus in RV on apical images; suggest CTA to further assess; mild TR; moderate pulmonary hypertension.  10/13 CXR> Chronic diffuse bilateral interstitial prominence, with some interval improvement-- likely improved pulmonary edema  Echo 10/19:  Left Ventricle: Left ventricular ejection fraction, by visual estimation, is 50 to 55%. The left ventricle has mildly decreased function. There is no left ventricular hypertrophy. Normal left ventricular size. Spectral Doppler shows Left ventricular  diastolic Doppler parameters are consistent with impaired relaxation pattern of LV diastolic filling. Markedly abnormal septal motion. Right Ventricle: The right ventricular size is mildly enlarged. No increase in right ventricular wall thickness. Global RV systolic function is has mildly reduced systolic function.  Micro Data:  10/11 SARS CoV2> neg  10/12 RVP neg  Antimicrobials:  Azithroymycin 10/11-10/13 Rocephin 10/11-10/13  Interim history/subjective:    Objective   Blood pressure (Abnormal) 142/78, pulse 91,  temperature  97.6 F (36.4 C), resp. rate 18, height 5' (1.524 m), weight 48.2 kg, SpO2 99 %.        Intake/Output Summary (Last 24 hours) at 02/26/2019 1100 Last data filed at 02/26/2019 1000 Gross per 24 hour  Intake 920 ml  Output 1075 ml  Net -155 ml   Filed Weights   02/24/19 0601 02/25/19 0500 02/26/19 0500  Weight: 48 kg 46.4 kg 48.2 kg    Examination:  General frail 68 year old white female. No distress.  HENT NCAT no JVD Pulm crackles bases. No accessory use  Card RRR  abd not tender + bowel sounds Neuro intact  Ext no edema brisk CR   Resolved Hospital Problem list     Assessment & Plan:   Acute on chronic respiratory failure with hypoxemia Interstitial lung disease: Rheumatoid factor positive Chronic obstructive pulmonary disease Pulmonary hypertension: -Moderate pulmonary hypertension on echocardiogram -Diastolic heart failure with recent decompensation & pulmonary edema  Transient hypertension   Discussion Multifactorial acute on chronic hypoxic respiratory failure in what appears to be Pulmonary edema from acute diastolic heart failure, acute pneumonitis 2/2 ILD flare (has RA) +/- AECOPD  CT chest personally reviewed still has diffuse patchy GG changes and traction btx.  -changed to pred 40mg  on 10/15 -still on high flow oxygen but this is improving.  -I&O balance -3.4 liters  Plan/rec Cont steroids Cont lasix as tolerated Needs rheum consult We have made f/u in our office   11/15 ACNP-BC [facility] Pulmonary/Critical Care Pager # [phone] OR # [phone] if no answer

## 2019-02-26 NOTE — Progress Notes (Signed)
Physical Therapy Treatment Patient Details Name: Julia Massey MRN: [id number] DOB: Nov 20, 1950 Today's Date: 02/26/2019    History of Present Illness Pt is a 68 y/o female admitted secondary to acute hypoxic respiratory failure likely secondary to acute pneumonitis. Pt placed on BiPap which has since been removed. PMH includes COPD and gout.     PT Comments    Pt with slow progression towards goals, however, was able to perform short distance gait this session. Pt continues to be limited by increased SOB and requires prolonged seated rests for recovery. Oxygen sats fluctuating between 80-92% on 13L of HFNC, however, unsure of accuracy as waveform not good. Encouraged pt to use [facility] for toileting to encourage more OOB mobility; notified RN as well. Will continue to follow acutely to maximize functional mobility independence and safety.    Follow Up Recommendations  Home health PT;Other (comment)(pending progress)     Equipment Recommendations  Rolling walker with 5" wheels(may need to consider WC if unable to progress gait)    Recommendations for Other Services       Precautions / Restrictions Precautions Precautions: Fall;Other (comment) Precaution Comments: Watch O2 sats Restrictions Weight Bearing Restrictions: No    Mobility  Bed Mobility Overal bed mobility: Modified Independent             General bed mobility comments: Increased SOB noted, and required seated rest prior to performing standing/gait tasks.   Transfers Overall transfer level: Needs assistance Equipment used: Rolling walker (2 wheeled) Transfers: Sit to/from Stand Sit to Stand: Min guard         General transfer comment: Min guard for safety. Cues for safe hand placement.   Ambulation/Gait Ambulation/Gait assistance: Min guard Gait Distance (Feet): 5 Feet Assistive device: Rolling walker (2 wheeled) Gait Pattern/deviations: Step-through pattern;Decreased stride length Gait velocity:  Decreased   General Gait Details: SLow, cautious gait. only able to perform very short distance, prior to requiring seated rest secondary to SOB. Oxygen sat reading fluctuating betweeing 80-92% on 13L HFNC, however, unsure of accuracy, as did not have a good waveform. Notified RN.    Stairs             Wheelchair Mobility    Modified Rankin (Stroke Patients Only)       Balance Overall balance assessment: Needs assistance Sitting-balance support: No upper extremity supported;Feet supported Sitting balance-Leahy Scale: Fair     Standing balance support: Bilateral upper extremity supported;During functional activity Standing balance-Leahy Scale: Poor Standing balance comment: Reliant on BUE support                             Cognition Arousal/Alertness: Awake/alert Behavior During Therapy: WFL for tasks assessed/performed Overall Cognitive Status: Within Functional Limits for tasks assessed                                        Exercises      General Comments        Pertinent Vitals/Pain Pain Assessment: No/denies pain    Home Living                      Prior Function            PT Goals (current goals can now be found in the care plan section) Acute Rehab PT Goals Patient Stated Goal: to be  able to breathe better PT Goal Formulation: With patient Time For Goal Achievement: 03/20/19 Potential to Achieve Goals: Good Progress towards PT goals: Progressing toward goals(slowly)    Frequency    Min 3X/week      PT Plan Current plan remains appropriate    Co-evaluation              AM-PAC PT "6 Clicks" Mobility   Outcome Measure  Help needed turning from your back to your side while in a flat bed without using bedrails?: None Help needed moving from lying on your back to sitting on the side of a flat bed without using bedrails?: None Help needed moving to and from a bed to a chair (including a  wheelchair)?: A Little Help needed standing up from a chair using your arms (e.g., wheelchair or bedside chair)?: A Little Help needed to walk in hospital room?: A Little Help needed climbing 3-5 steps with a railing? : A Lot 6 Click Score: 19    End of Session Equipment Utilized During Treatment: Gait belt Activity Tolerance: Treatment limited secondary to medical complications (Comment)(increased SOB ) Patient left: in bed;with call bell/phone within reach Nurse Communication: Mobility status(oxygen sats) PT Visit Diagnosis: Muscle weakness (generalized) (M62.81);Difficulty in walking, not elsewhere classified (R26.2)     Time: 8657-8469 PT Time Calculation (min) (ACUTE ONLY): 23 min  Charges:  $Therapeutic Activity: 23-37 mins                     Gladys Damme, PT, DPT  Acute Rehabilitation Services  Pager: [phone] Office: [phone]    Julia Massey 02/26/2019, 5:53 PM

## 2019-02-26 NOTE — Progress Notes (Signed)
Subjective:    Julia Massey reports she is feeling better today compared to yesterday. She felt very tired yesterday. She is looking forward to working with PT today.  We discussed the plan for additional imaging now that she is more stable. She is in agreement.   Objective:  Vital signs in last 24 hours: Vitals:   02/25/19 2002 02/25/19 2025 02/26/19 0452 02/26/19 0500  BP: 122/89  (!) 142/78   Pulse: 99  91   Resp: 20  20   Temp: 98.1 F (36.7 C)  97.6 F (36.4 C)   TempSrc: Oral     SpO2: 92% 96% 95%   Weight:    48.2 kg  Height:        Physical Exam Vitals signs and nursing note reviewed.  Constitutional:      General: She is not in acute distress.    Appearance: She is well-developed. She is not toxic-appearing.  Cardiovascular:     Rate and Rhythm: Regular rhythm. Tachycardia present.     Heart sounds: No murmur.  Pulmonary:     Effort: Tachypnea and accessory muscle usage present. No respiratory distress.     Breath sounds: Examination of the right-middle field reveals rales. Examination of the left-middle field reveals rales. Examination of the right-lower field reveals rales. Examination of the left-lower field reveals rales. Rales (improving) present. No decreased breath sounds, wheezing or rhonchi.  Abdominal:     General: Bowel sounds are normal.     Palpations: Abdomen is soft.  Skin:    General: Skin is warm and dry.     Findings: No ecchymosis or rash.  Neurological:     General: No focal deficit present.     Mental Status: She is alert and oriented to person, place, and time.  Psychiatric:        Mood and Affect: Mood normal.        Behavior: Behavior normal.    Assessment/Plan:  Active Problems:   Acute respiratory failure with hypoxemia (HCC)  # Acute hypoxic respiratory failure  # Interstitial Lung Disease 2/2 RA CTA on 10/12 was notable for bibasilar honeycombing, diffuse ground glass opacities, enlarged right ventricle and pulmonary  arteries consistent with pulmonary artery hypertension and chronic changes consistent with interstitial lung disease. PCCM was consulted and notes this appears to be ILD. Rheumatologic workup notable for elevated sed rate and positive RF, rest of serology were negative.   She was initially on Solumedrol but has been taper to Prednisone, PO, 4 days ago. Over the weekend, her O2 requirement increased from HFNC 10L to 15L. Additionally, she desats to the low 80s with minimal movement. Asked her nurse to move the o2 sat monitor from her finger to her earlobe due to concern for Raynauds. She was doing better with increased dose of Prednisone and concerned about tapering too early. I see her taper was initiated to begin tomorrow by a 10mg  decrease in dose, will discuss with PCCM.   High-resolution CT today was obtained as patient has been dry on examination. With right sided only heart failure, I would not expect to see pulmonary edema. However, there continued to be significant ground glass opacities on imaging, which could be ILD flare vs edema. PCCM gave one time dose of Lasix today. Will monitor carefully for volume depletion.   - PCCM on board and we appreciate their recommendations - Telemetry - HFNC - Wean as tolerated - Duoneb TID, per RT - Prednisone 30mg  QD (per PCCM)  #  Pulmonary Hypertension # Right Heart Failure  Last echo in 2017 showed grade 1 diastolic dysfunction with preserved EF at 55-60%. BNP on this admission was 1170. TTE on 10/12 showed right ventricle has severely reduced systolic function with moderate pulmonary hypertension. Repeat TTE (as recommended by PCCM when patient more stable) today showed continued right heart failure but not as severe as prior. In addition, marked septal abnormality was noted. Pulm HTN was unable to be assessed. Will need to be careful regarding volume depletion given dependence on preload. One time dose of Lasix given by PCCM.   - Telemetry - Strict  I/Os  - Daily weights - Lasix per PCCM  #Elevated BP:  Improved significant. Continue to monitor.   # Secondary Polycythemia  Likely secondary to chronic hypoxia with hx of chronic lung disease. Hemoglobin trends around 16-17. Will continue to monitor.   # Chronic Back Pain:  Restarted home Norco.   - Norco 5-325mg , 1-2 tablets q6h PRN  # Hx of Depression: Continue Prozac   Dispo: Anticipated discharge pending clinical improvement.   Dr. Verdene Lennert Internal Medicine PGY-1  Pager: [phone] 02/26/2019, 6:46 AM

## 2019-02-26 NOTE — Care Management Important Message (Signed)
Important Message  Patient Details  Name: Julia Massey MRN: [id number] Date of Birth: Feb 12, 1951   Medicare Important Message Given:  Yes     [name] 02/26/2019, 1:19 PM

## 2019-02-26 NOTE — Progress Notes (Signed)
Patient was transported to CT scan of lungs with Probation officer accompanying transportation. Patient tolerated well, Also Probe has been changed to band-aid 02 sensor to ear lobe with accurate wave forms sating 100% on 13L of HF [place].

## 2019-02-26 NOTE — Progress Notes (Signed)
NAME:  Julia Massey, MRN:  [id number], DOB:  [birth year], LOS: 8 ADMISSION DATE:  02/10/2019, CONSULTATION DATE:  02/19/19 REFERRING MD:  Daron Offer IMTS, CHIEF COMPLAINT:  Acute on chronic respiratory failure  Brief History   68 yo F Hx COPD  Respiratory failure  History of present illness   68 yo F PMH COPD, chronic pain grade I dialstolic dysfunction who presented to ED 10/11 with CC SOB. Myalgias, malaise, nightsweats began 2 days prior to presentation. Patient began taking leftover levaquin from prior infection x 2 doses. Additionally she took [employer], as well as home albuterol. On Sunday, patient with significantly worse SOB. Denied fever, cough, sore throat, rhinitis, HA, n/v/d, dizziness, changes in appetite.   Patient presents with leukocytosos 18.8. In ED she was given duonebs ans started on abx + steroids. COVID-19 negative. Patient placed on NRB with SpO2 92% Presenting ABG: 7.446/ 26/55/18/19/91   Admitted to IMTS and started on BiPAP. PCCM asked to see 10/12.  Past Medical History  COPD Carpal Tunnel Chronic back pain Gout HLD Diastolic heart dysfunction   Significant Hospital Events   10/11 admitted 10/12 PCCM consulted.  Consults:  PCCM  Procedures:   Significant Diagnostic Tests:  10/11 CXR> Chronic interstitial lung disease with new R sided opacities.  10/12 ECHO >>> 10/12 CT angio chest> No PE. enlarged heart, no pericardial effusion. Diffuse ground-glass appearance throughout lungs. No Pleural Effusion. Emphysematous changes.  10/13 ECHO> EF 55-60%, severely reduced RV systolic function, mildly dilated LA 10/13 CXR> Chronic diffuse bilateral interstitial prominence, with some interval improvement-- likely improved pulmonary edema  Echo 10/19 >   Micro Data:  10/11 SARS CoV2> neg  10/12 RVP neg  Antimicrobials:  Azithroymycin 10/11-10/13 Rocephin 10/11-10/13  Interim history/subjective:  Feels she is a bit better.  Down to 13L HFNC with SpO2  97%.  Objective   Blood pressure (!) 142/78, pulse 91, temperature 97.6 F (36.4 C), resp. rate 18, height 5' (1.524 m), weight 48.2 kg, SpO2 99 %.        Intake/Output Summary (Last 24 hours) at 02/26/2019 1131 Last data filed at 02/26/2019 1000 Gross per 24 hour  Intake 920 ml  Output 1075 ml  Net -155 ml   Filed Weights   02/24/19 0601 02/25/19 0500 02/26/19 0500  Weight: 48 kg 46.4 kg 48.2 kg    Examination: General: Frail female, resting comfortably HENT: Moist oral mucosa, EOMI Lungs: Faint crackles in bases Cardiovascular: RRR Abdomen: Bowel sounds appreciated Extremities: No clubbing Neuro: Alert and oriented x3   Assessment & Plan:  Interstitial lung disease - ? Rheumatoid given positive RF. - Rheumatology follow-up as outpatient. - Further work-up of ILD will include high-resolution CT, will delay high-resolution CT till she is adequately diuresed and CT scan picture will not be clouded by groundglass changes which may be secondary to decompensated heart failure. - She will also require pulmonary function testing when more stable. - Out of bed / mobilize as able. - Outpatient pulmonary follow up being arranged.  Pulmonary edema.  - 1 dose lasix today and assess response.  Acute on chronic respiratory failure with hypoxemia - multifactorial 2/2 above. - Continue oxygen supplementation.  Chronic obstructive pulmonary disease - Continue Bronchodilators, steroids (start taper today, 30mg  today x 3 days then 20mg  x 3 days then 10mg  x 3 days then off).  Pulmonary hypertension - Moderate pulmonary hypertension on echocardiogram - F/u on repeat echo 10/19. - We will need a right heart catheterization if degree  of pulmonary hypertension is felt to be out of contact with underlying heart and lung disease.  Rest per primary team.   Best practice:  Diet: Per primary Pain/Anxiety/Delirium protocol (if indicated): APAP VAP protocol (if indicated): na DVT  prophylaxis: lovenox Mobility: Recommend mobilization as tolerated  Code Status: Full  Family Communication: patient updated  Disposition: Progressive    Montey Hora, [place] [facility] Pager: [phone].  If no answer, [phone] 02/26/2019, 11:42 AM

## 2019-02-27 DIAGNOSIS — G8929 Other chronic pain: Secondary | ICD-10-CM | POA: Diagnosis not present

## 2019-02-27 DIAGNOSIS — M069 Rheumatoid arthritis, unspecified: Secondary | ICD-10-CM | POA: Diagnosis not present

## 2019-02-27 DIAGNOSIS — J9601 Acute respiratory failure with hypoxia: Secondary | ICD-10-CM | POA: Diagnosis not present

## 2019-02-27 DIAGNOSIS — I272 Pulmonary hypertension, unspecified: Secondary | ICD-10-CM | POA: Diagnosis not present

## 2019-02-27 DIAGNOSIS — M549 Dorsalgia, unspecified: Secondary | ICD-10-CM | POA: Diagnosis not present

## 2019-02-27 DIAGNOSIS — I10 Essential (primary) hypertension: Secondary | ICD-10-CM | POA: Diagnosis not present

## 2019-02-27 DIAGNOSIS — I50810 Right heart failure, unspecified: Secondary | ICD-10-CM | POA: Diagnosis not present

## 2019-02-27 DIAGNOSIS — J849 Interstitial pulmonary disease, unspecified: Secondary | ICD-10-CM | POA: Diagnosis not present

## 2019-02-27 DIAGNOSIS — D751 Secondary polycythemia: Secondary | ICD-10-CM | POA: Diagnosis not present

## 2019-02-27 DIAGNOSIS — R69 Illness, unspecified: Secondary | ICD-10-CM | POA: Diagnosis not present

## 2019-02-27 LAB — CBC WITH DIFFERENTIAL/PLATELET
Abs Immature Granulocytes: 0.29 10*3/uL — ABNORMAL HIGH (ref 0.00–0.07)
Basophils Absolute: 0 10*3/uL (ref 0.0–0.1)
Basophils Relative: 0 %
Eosinophils Absolute: 0.8 10*3/uL — ABNORMAL HIGH (ref 0.0–0.5)
Eosinophils Relative: 4 %
HCT: 52.2 % — ABNORMAL HIGH (ref 36.0–46.0)
Hemoglobin: 18.1 g/dL — ABNORMAL HIGH (ref 12.0–15.0)
Immature Granulocytes: 2 %
Lymphocytes Relative: 15 %
Lymphs Abs: 2.6 10*3/uL (ref 0.7–4.0)
MCH: 31.4 pg (ref 26.0–34.0)
MCHC: 34.7 g/dL (ref 30.0–36.0)
MCV: 90.5 fL (ref 80.0–100.0)
Monocytes Absolute: 1.3 10*3/uL — ABNORMAL HIGH (ref 0.1–1.0)
Monocytes Relative: 7 %
Neutro Abs: 12.9 10*3/uL — ABNORMAL HIGH (ref 1.7–7.7)
Neutrophils Relative %: 72 %
Platelets: 447 10*3/uL — ABNORMAL HIGH (ref 150–400)
RBC: 5.77 MIL/uL — ABNORMAL HIGH (ref 3.87–5.11)
RDW: 12 % (ref 11.5–15.5)
WBC: 17.9 10*3/uL — ABNORMAL HIGH (ref 4.0–10.5)
nRBC: 0 % (ref 0.0–0.2)

## 2019-02-27 LAB — BASIC METABOLIC PANEL
Anion gap: 14 (ref 5–15)
BUN: 22 mg/dL (ref 8–23)
CO2: 26 mmol/L (ref 22–32)
Calcium: 10.1 mg/dL (ref 8.9–10.3)
Chloride: 92 mmol/L — ABNORMAL LOW (ref 98–111)
Creatinine, Ser: 0.59 mg/dL (ref 0.44–1.00)
GFR calc Af Amer: >60 mL/min (ref >60)
GFR calc non Af Amer: >60 mL/min (ref >60)
Glucose, Bld: 113 mg/dL — ABNORMAL HIGH (ref 70–99)
Potassium: 4.3 mmol/L (ref 3.5–5.1)
Sodium: 132 mmol/L — ABNORMAL LOW (ref 135–145)

## 2019-02-27 LAB — GLUCOSE, CAPILLARY
Glucose-Capillary: 136 mg/dL — ABNORMAL HIGH (ref 70–99)
Glucose-Capillary: 154 mg/dL — ABNORMAL HIGH (ref 70–99)
Glucose-Capillary: 141 mg/dL — ABNORMAL HIGH (ref 70–99)

## 2019-02-27 LAB — CYCLIC CITRUL PEPTIDE ANTIBODY, IGG/IGA: CCP Antibodies IgG/IgA: 4 units (ref 0–19)

## 2019-02-27 NOTE — Progress Notes (Signed)
Pt had 13bts SVT. Upon assessment, pt has been coughing that eventually stops. denies chest pain and dizziness. Will monitor.

## 2019-02-27 NOTE — Progress Notes (Signed)
Physical Therapy Treatment Patient Details Name: Julia Massey MRN: [id number] DOB: 03/12/1951 Today's Date: 02/27/2019    History of Present Illness Pt is a 68 y/o female admitted secondary to acute hypoxic respiratory failure likely secondary to acute pneumonitis. Pt placed on BiPap which has since been removed. PMH includes COPD and gout.     PT Comments    Patient seen for mobility progression. Pt is in bed upon arrival and eager to participate in therapy. Pt continues to be limited by SOB with mobility. Pt on 10L HFNC upon arrival with SpO2 90% at rest. SpO2 desat and increased SOB with all mobility so O2 increased up to 13L HFNC to increase SpO2 to 89% until pt recovered. Pt on 10L HFNC end of session. Pt requires supervision for OOB transfers with RW. Continue to progress as tolerated.   Unable to get accurate reading of SpO2 while mobilizing due to poor waveform.     Follow Up Recommendations  Home health PT;Other (comment)(pending progress)     Equipment Recommendations  Rolling walker with 5" wheels(may need to consider WC if unable to progress gait)    Recommendations for Other Services       Precautions / Restrictions Precautions Precautions: Fall;Other (comment) Precaution Comments: Watch O2 sats Restrictions Weight Bearing Restrictions: No    Mobility  Bed Mobility Overal bed mobility: Modified Independent             General bed mobility comments: increased effort due to SOB; rest needed in sitting prior to OOB mobility  Transfers Overall transfer level: Needs assistance Equipment used: Rolling walker (2 wheeled) Transfers: Sit to/from [employer] Sit to Stand: Supervision Stand pivot transfers: Supervision       General transfer comment: supervision for safety  Ambulation/Gait             General Gait Details: unable to progress gait after stand pivot transfer due to SOB   Stairs             Wheelchair  Mobility    Modified Rankin (Stroke Patients Only)       Balance Overall balance assessment: Needs assistance Sitting-balance support: No upper extremity supported;Feet supported Sitting balance-Leahy Scale: Fair     Standing balance support: Bilateral upper extremity supported;During functional activity Standing balance-Leahy Scale: Poor                              Cognition Arousal/Alertness: Awake/alert Behavior During Therapy: WFL for tasks assessed/performed Overall Cognitive Status: Within Functional Limits for tasks assessed                                        Exercises      General Comments General comments (skin integrity, edema, etc.): pt on 10L HFNC upon arrival and pt in supine with SpO2 90%; with mobility SpO2 desat to 70% (poor waveform so not sure of accuracy) and pt very SOB so increased to 13L HFNC for OOB mobility; pt back on 10L end of session when at rest and SpO2 >89%      Pertinent Vitals/Pain Pain Assessment: No/denies pain    Home Living                      Prior Function            PT  Goals (current goals can now be found in the care plan section) Acute Rehab PT Goals Patient Stated Goal: to be able to breathe better Progress towards PT goals: Progressing toward goals    Frequency    Min 3X/week      PT Plan Current plan remains appropriate    Co-evaluation              AM-PAC PT "6 Clicks" Mobility   Outcome Measure  Help needed turning from your back to your side while in a flat bed without using bedrails?: None Help needed moving from lying on your back to sitting on the side of a flat bed without using bedrails?: None Help needed moving to and from a bed to a chair (including a wheelchair)?: A Little Help needed standing up from a chair using your arms (e.g., wheelchair or bedside chair)?: A Little Help needed to walk in hospital room?: A Little Help needed climbing 3-5 steps  with a railing? : A Lot 6 Click Score: 19    End of Session Equipment Utilized During Treatment: Gait belt Activity Tolerance: Treatment limited secondary to medical complications (Comment)(increased SOB ) Patient left: with call bell/phone within reach;in chair Nurse Communication: Mobility status(oxygen sats) PT Visit Diagnosis: Muscle weakness (generalized) (M62.81);Difficulty in walking, not elsewhere classified (R26.2)     Time: 3846-6599 PT Time Calculation (min) (ACUTE ONLY): 29 min  Charges:  $Gait Training: 8-22 mins $Therapeutic Activity: 8-22 mins                     Erline Levine, PTA Acute Rehabilitation Services Pager: [phone] Office: [phone]     Carolynne Edouard 02/27/2019, 11:34 AM

## 2019-02-27 NOTE — Progress Notes (Signed)
Internal Medicine Attending:   I saw and examined the patient. I reviewed the resident's note and I agree with the resident's findings and plan as documented in the resident's note.  Patient states that she feels a little better today.  She has had no fevers and she has been titrated down to 10 L high flow nasal cannula from 15.  Patient initially mated to the hospital with hypoxic respiratory failure and was found to have interstitial lung disease.  High-resolution CT from yesterday showed UIP possibly secondary to IPF versus possible ILD from RA (this seems less likely).  Pulmonary follow-up and recommendations appreciated.  We will continue with prednisone 30 mg daily per pulmonology.  Discussed with the patient about addressing her goals of care and she is agreeable to meeting with palliative care about this.  Palliative care consult placed.  We will continue to wean her off oxygen as tolerated.  Continue with DuoNeb 3 times daily.  No further work-up at this time.  We will continue to monitor closely.  Of note, patient's repeat echo was unable to clarify pulmonary hypertension.  I suspect that she may have pulmonary hypertension from her underlying ILD.  This may need to be worked up further once her pulmonary status stabilizes.

## 2019-02-27 NOTE — Progress Notes (Signed)
Subjective:    Julia Massey reports she is doing better today. She was able to work with PT some yesterday and has a goal of making it to the recliner today.   We discussed her CT results that show fibrosis that may not be due to RA as we initially suspected. We discussed that it will be important to try and prevent progression of the fibrosis. Initiated a goals of care conversation and patient is okay with a palliative consult to discuss this more.   Objective:  Vital signs in last 24 hours: Vitals:   02/26/19 1708 02/26/19 1918 02/26/19 2001 02/27/19 0400  BP: (!) 127/104 (!) 138/94  (!) 167/117  Pulse: 93 94 84 97  Resp: (!) 22 20 16 20   Temp: (!) 97.2 F (36.2 C) 97.7 F (36.5 C)  98.1 F (36.7 C)  TempSrc: Oral Oral  Oral  SpO2: 97% 94% 95% 96%  Weight:    46.4 kg  Height:        Physical Exam Vitals signs and nursing note reviewed.  Constitutional:      General: She is not in acute distress.    Appearance: She is well-developed. She is not toxic-appearing.  Cardiovascular:     Rate and Rhythm: Regular rhythm. Tachycardia present.     Heart sounds: No murmur.  Pulmonary:     Effort: Tachypnea present. No accessory muscle usage or respiratory distress.     Breath sounds: Examination of the right-middle field reveals rales. Examination of the left-middle field reveals rales. Examination of the right-lower field reveals rales. Examination of the left-lower field reveals rales. Rales (dry) present. No decreased breath sounds, wheezing or rhonchi.  Abdominal:     General: Bowel sounds are normal.     Palpations: Abdomen is soft.  Skin:    General: Skin is warm and dry.     Findings: No ecchymosis or rash.  Neurological:     General: No focal deficit present.     Mental Status: She is alert and oriented to person, place, and time.  Psychiatric:        Mood and Affect: Mood normal.        Behavior: Behavior normal.    Assessment/Plan:  Active Problems:   Acute  respiratory failure with hypoxemia (HCC)  # Acute hypoxic respiratory failure  CTA on 10/12 was notable for bibasilar honeycombing, diffuse ground glass opacities, enlarged right ventricle and pulmonary arteries consistent with pulmonary artery hypertension and chronic changes consistent with interstitial lung disease. PCCM was consulted and noted possible ILD. Rheumatologic workup notable for elevated sed rate and positive RF, rest of serology were negative including CCP. High-resolution CT on 10/19 showed significant ground glass opacities and findings consistent with UIP with possible overlying edema vs UIP flare. Without any left heart failure, I feel it is unlikely she has pulmonary edema, so most likely UIP flare. 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 Despite the lack of mortality benefit and possible harm, patient will require steroids to maintain her pulmonary function. No further taper planned.   Given poor prognosis with IPF, palliative has been consulted to have a goals of care discussion with the patient. She was okay with this plan.   - PCCM on board and we appreciate their recommendations - Palliative has been consulted  - Telemetry - HFNC - Wean as tolerated - Duoneb TID  - Prednisone 30mg  QD  # Pulmonary Hypertension # Right Heart Failure  Last echo in 2017 showed  grade 1 diastolic dysfunction with preserved EF at 55-60%. BNP on this admission was 1170. TTE on 10/12 showed right ventricle has severely reduced systolic function with moderate pulmonary hypertension. Repeat TTE (as recommended by PCCM when patient more stable) today showed continued right heart failure but not as severe as prior. In addition, marked septal abnormality was noted. Pulm HTN was unable to be assessed. Will need to be careful regarding volume depletion given dependence on preload.    - Telemetry - Strict I/Os  - Daily weights  #Elevated BP:  Improved significant. Continue  to monitor.   # Secondary Polycythemia  Likely secondary to chronic hypoxia with hx of chronic lung disease. Hemoglobin stable around 16-17. Will continue to monitor.    # Chronic Back Pain:  Restarted home Norco.   - Norco 5-325mg , 1-2 tablets q6h PRN  # Hx of Depression: Continue Prozac   Dispo: Anticipated discharge pending clinical improvement.   Dr. Jose Persia Internal Medicine PGY-1  Pager: [phone] 02/27/2019, 7:19 AM

## 2019-02-28 DIAGNOSIS — R69 Illness, unspecified: Secondary | ICD-10-CM | POA: Diagnosis not present

## 2019-02-28 DIAGNOSIS — R0609 Other forms of dyspnea: Secondary | ICD-10-CM | POA: Diagnosis not present

## 2019-02-28 DIAGNOSIS — M069 Rheumatoid arthritis, unspecified: Secondary | ICD-10-CM | POA: Diagnosis not present

## 2019-02-28 DIAGNOSIS — I50810 Right heart failure, unspecified: Secondary | ICD-10-CM | POA: Diagnosis not present

## 2019-02-28 DIAGNOSIS — I10 Essential (primary) hypertension: Secondary | ICD-10-CM | POA: Diagnosis not present

## 2019-02-28 DIAGNOSIS — I2510 Atherosclerotic heart disease of native coronary artery without angina pectoris: Secondary | ICD-10-CM | POA: Diagnosis not present

## 2019-02-28 DIAGNOSIS — Z7189 Other specified counseling: Secondary | ICD-10-CM | POA: Diagnosis not present

## 2019-02-28 DIAGNOSIS — Z515 Encounter for palliative care: Secondary | ICD-10-CM | POA: Diagnosis not present

## 2019-02-28 DIAGNOSIS — R531 Weakness: Secondary | ICD-10-CM

## 2019-02-28 DIAGNOSIS — J849 Interstitial pulmonary disease, unspecified: Secondary | ICD-10-CM | POA: Diagnosis not present

## 2019-02-28 DIAGNOSIS — R0603 Acute respiratory distress: Secondary | ICD-10-CM | POA: Diagnosis not present

## 2019-02-28 DIAGNOSIS — G8929 Other chronic pain: Secondary | ICD-10-CM | POA: Diagnosis not present

## 2019-02-28 DIAGNOSIS — R0789 Other chest pain: Secondary | ICD-10-CM | POA: Diagnosis not present

## 2019-02-28 DIAGNOSIS — J9601 Acute respiratory failure with hypoxia: Secondary | ICD-10-CM | POA: Diagnosis not present

## 2019-02-28 DIAGNOSIS — M549 Dorsalgia, unspecified: Secondary | ICD-10-CM | POA: Diagnosis not present

## 2019-02-28 DIAGNOSIS — R0600 Dyspnea, unspecified: Secondary | ICD-10-CM | POA: Diagnosis not present

## 2019-02-28 DIAGNOSIS — I272 Pulmonary hypertension, unspecified: Secondary | ICD-10-CM | POA: Diagnosis not present

## 2019-02-28 DIAGNOSIS — I2609 Other pulmonary embolism with acute cor pulmonale: Secondary | ICD-10-CM | POA: Diagnosis not present

## 2019-02-28 DIAGNOSIS — D751 Secondary polycythemia: Secondary | ICD-10-CM | POA: Diagnosis not present

## 2019-02-28 LAB — CBC WITH DIFFERENTIAL/PLATELET
Abs Immature Granulocytes: 0.21 10*3/uL — ABNORMAL HIGH (ref 0.00–0.07)
Basophils Absolute: 0.1 10*3/uL (ref 0.0–0.1)
Basophils Relative: 0 %
Eosinophils Absolute: 0.2 10*3/uL (ref 0.0–0.5)
Eosinophils Relative: 1 %
HCT: 51.5 % — ABNORMAL HIGH (ref 36.0–46.0)
Hemoglobin: 17.4 g/dL — ABNORMAL HIGH (ref 12.0–15.0)
Immature Granulocytes: 1 %
Lymphocytes Relative: 16 %
Lymphs Abs: 2.6 10*3/uL (ref 0.7–4.0)
MCH: 30.7 pg (ref 26.0–34.0)
MCHC: 33.8 g/dL (ref 30.0–36.0)
MCV: 91 fL (ref 80.0–100.0)
Monocytes Absolute: 1.4 10*3/uL — ABNORMAL HIGH (ref 0.1–1.0)
Monocytes Relative: 8 %
Neutro Abs: 11.9 10*3/uL — ABNORMAL HIGH (ref 1.7–7.7)
Neutrophils Relative %: 74 %
Platelets: 486 10*3/uL — ABNORMAL HIGH (ref 150–400)
RBC: 5.66 MIL/uL — ABNORMAL HIGH (ref 3.87–5.11)
RDW: 12 % (ref 11.5–15.5)
WBC: 16.4 10*3/uL — ABNORMAL HIGH (ref 4.0–10.5)
nRBC: 0 % (ref 0.0–0.2)

## 2019-02-28 LAB — BASIC METABOLIC PANEL
Anion gap: 13 (ref 5–15)
BUN: 20 mg/dL (ref 8–23)
CO2: 25 mmol/L (ref 22–32)
Calcium: 9.4 mg/dL (ref 8.9–10.3)
Chloride: 94 mmol/L — ABNORMAL LOW (ref 98–111)
Creatinine, Ser: 0.55 mg/dL (ref 0.44–1.00)
GFR calc Af Amer: >60 mL/min (ref >60)
GFR calc non Af Amer: >60 mL/min (ref >60)
Glucose, Bld: 109 mg/dL — ABNORMAL HIGH (ref 70–99)
Potassium: 3.8 mmol/L (ref 3.5–5.1)
Sodium: 132 mmol/L — ABNORMAL LOW (ref 135–145)

## 2019-02-28 LAB — GLUCOSE, CAPILLARY: Glucose-Capillary: 135 mg/dL — ABNORMAL HIGH (ref 70–99)

## 2019-02-28 NOTE — Consult Note (Signed)
Consultation Note Date: 02/28/2019   Patient Name: Julia Massey  DOB: Apr 23, 1951  MRN: [id number]  Age / Sex: 68 y.o., female  PCP: Courtney Paris, NP Referring Physician: Earl Lagos, MD  Reason for Consultation: Establishing goals of care and Psychosocial/spiritual support  HPI/Patient Profile: 68 y.o. female  admitted on 03/05/2019 with  past medical history significant for acute on chronic cor pulmonale/echo evidence of PAH and severe RV dysfunction, acute on chronic hypoxic respiratory failure/underlying COPD with prolonged intubation in 2017, polycythemia, chronic back pain, depression.  Admitted through the emergency room with increased muscle aches and fatigue, soaking night sweats.   Today is day 10 of her hospitalization, here for treatment and stabilization.  For cardiac cath tomorrow   Patient faces treatment option decisions, advanced directive decisions and anticipatory care needs.  Clinical Assessment and Goals of Care:  This NP Lorinda Creed reviewed medical records, received report from team, assessed the patient and then meet at the patient's bedside   to discuss diagnosis, prognosis, GOC, disposition and options.  Concept of Palliative Care was discussed  A  discussion was had today regarding advanced directives.  Concepts specific to code status, artifical feeding and hydration, continued IV antibiotics and rehospitalization was had.  The difference between a aggressive medical intervention path  and a palliative comfort care path for this patient at this time was had.  Values and goals of care important to patient and family were attempted to be elicited.   Questions and concerns addressed.   Family encouraged to call with questions or concerns.    PMT will continue to support holistically.    No documented healthcare power of attorney   NEXT OF KIN    SUMMARY OF  RECOMMENDATIONS    Code Status/Advance Care Planning:  Full code  Discussed with patient the importance of advanced care planning.  Discussed with patient the importance of continued conversation with her family and the  medical providers regarding overall plan of care and treatment options,  ensuring decisions are within the context of the patients values and GOCs moving into the future   Additional Recommendations (Limitations, Scope, Preferences):  Full Scope Treatment   Patient is open to all offered and available medical interventions to prolong life.  "I have a lot more living to do"  Psycho-social/Spiritual:   Desire for further Chaplaincy support: no   Additional Recommendations: Created space and opportunity for patient to explore her thoughts and feelings regarding her current medical situation.  Although she had been "very sick in 2017 and needed to be on a ventilator" this rapid decline in discussion regarding the seriousness of her current medical situation is "very surprising" to her  Prognosis:   Unable to determine  Discharge Planning: To Be Determined      Primary Diagnoses: Present on Admission: . Acute respiratory failure with hypoxemia (HCC)   I have reviewed the medical record, interviewed the patient and family, and examined the patient. The following aspects are pertinent.  Past Medical History:  Diagnosis Date  .  ADHD   . CHF (congestive heart failure) ([place])   . Chronic pain   . COPD (chronic obstructive pulmonary disease) ([place])   . COPD exacerbation ([place])   . GERD (gastroesophageal reflux disease)   . Hypercholesterolemia   . Sleep apnea    Social History   Socioeconomic History  . Marital status: Married    Spouse name: Not on file  . Number of children: Not on file  . Years of education: Not on file  . Highest education level: Not on file  Occupational History  . Not on file  Social Needs  . Financial resource strain: Not on file   . Food insecurity    Worry: Not on file    Inability: Not on file  . Transportation needs    Medical: Not on file    Non-medical: Not on file  Tobacco Use  . Smoking status: Former Smoker    Packs/day: 0.25    Years: 15.00    Pack years: 3.75    Types: Cigarettes    Quit date: 04/10/2015    Years since quitting: 3.8  . Smokeless tobacco: Never Used  Substance and Sexual Activity  . Alcohol use: Yes    Alcohol/week: 4.0 standard drinks    Types: 2 Glasses of wine, 2 Cans of beer per week  . Drug use: No  . Sexual activity: Not on file  Lifestyle  . Physical activity    Days per week: Not on file    Minutes per session: Not on file  . Stress: Not on file  Relationships  . Social Herbalist on phone: Not on file    Gets together: Not on file    Attends religious service: Not on file    Active member of club or organization: Not on file    Attends meetings of clubs or organizations: Not on file    Relationship status: Not on file  Other Topics Concern  . Not on file  Social History Narrative  . Not on file   History reviewed. No pertinent family history. Scheduled Meds: . enoxaparin (LOVENOX) injection  40 mg Subcutaneous Q24H  . FLUoxetine  40 mg Oral Daily  . influenza vaccine adjuvanted  0.5 mL Intramuscular Tomorrow-1000  . ipratropium-albuterol  3 mL Nebulization TID  . mouth rinse  15 mL Mouth Rinse q12n4p  . predniSONE  30 mg Oral Daily   Continuous Infusions: PRN Meds:.acetaminophen **OR** acetaminophen, albuterol, calcium carbonate, hydrALAZINE, HYDROcodone-acetaminophen, senna-docusate, sodium chloride Medications Prior to Admission:  Prior to Admission medications   Medication Sig Start Date End Date Taking? Authorizing Provider  amphetamine-dextroamphetamine (ADDERALL) 30 MG tablet Take 30 mg by mouth daily.   Yes [provider]  cetirizine (ZYRTEC) 10 MG tablet Take 10 mg by mouth daily as needed for allergies. Reported on 06/18/2015    Yes [provider]  Cyanocobalamin ([employer] B-12) 1000 MCG/15ML LIQD Take 1,000 mcg by mouth daily.    Yes [provider]  FLUoxetine (PROZAC) 40 MG capsule Take 1 capsule (40 mg total) by mouth daily. 06/12/15  Yes Angiulli, Lavon Paganini, PA-C  folic acid (FOLVITE) 1 MG tablet Take 1 tablet (1 mg total) by mouth daily. 06/12/15  Yes Angiulli, Lavon Paganini, PA-C  HYDROcodone-acetaminophen (NORCO) 10-325 MG tablet Take 1 tablet by mouth every 6 (six) hours as needed for moderate pain.   Yes [provider]  ibuprofen (ADVIL) 200 MG tablet Take 400 mg by mouth every 6 (six) hours  as needed for moderate pain.   Yes [provider]  levofloxacin (LEVAQUIN) 500 MG tablet Take 500 mg by mouth daily.   Yes [provider]  naproxen sodium (ALEVE) 220 MG tablet Take 220 mg by mouth as needed (pain).   Yes [provider]  polyethylene glycol (MIRALAX / GLYCOLAX) packet Take 17 g by mouth daily. Patient not taking: Reported on 06/18/2015 06/12/15   Charlton Amor, PA-C   No Known Allergies Review of Systems  Constitutional: Positive for fatigue.  Respiratory: Positive for shortness of breath.   Neurological: Positive for weakness.    Physical Exam Constitutional:      Appearance: She is underweight.     Interventions: Nasal cannula in place.  Cardiovascular:     Rate and Rhythm: Normal rate.  Pulmonary:     Breath sounds: Normal breath sounds.  Skin:    General: Skin is warm and dry.  Neurological:     Mental Status: She is alert.     Vital Signs: BP 128/76 (BP Location: Right Arm)   Pulse 98   Temp 97.9 F (36.6 C) (Oral)   Resp 20   Ht 5' (1.524 m)   Wt 45 kg Comment: scale b  SpO2 91%   BMI 19.39 kg/m  Pain Scale: 0-10   Pain Score: 0-No pain   SpO2: SpO2: 91 % O2 Device:SpO2: 91 % O2 Flow Rate: .O2 Flow Rate (L/min): 13 L/min  IO: Intake/output summary:   Intake/Output Summary (Last 24 hours) at 02/28/2019 1231 Last data filed  at 02/28/2019 0700 Gross per 24 hour  Intake 600 ml  Output 400 ml  Net 200 ml    LBM: Last BM Date: 02/19/19(Per patient) Baseline Weight: Weight: 51.3 kg Most recent weight: Weight: 45 kg(scale b)     Palliative Assessment/Data:     Time In: 1100 Time Out: 1215 Time Total: 75 minutes Greater than 50%  of this time was spent counseling and coordinating care related to the above assessment and plan.  Signed by: Lorinda Creed, NP   Please contact Palliative Medicine Team phone at [phone] for questions and concerns.  For individual provider: See Loretha Stapler

## 2019-02-28 NOTE — Progress Notes (Signed)
NAME:  Julia Massey, MRN:  [id number], DOB:  05/17/1950, LOS: 10 ADMISSION DATE:  03/03/2019, CONSULTATION DATE:  02/19/19 REFERRING MD:  Daron Offer IMTS, CHIEF COMPLAINT:  Acute on chronic respiratory failure  Brief History   68 yo F Hx COPD  Respiratory failure  History of present illness   68 yo F PMH COPD, chronic pain grade I dialstolic dysfunction who presented to ED 10/11 with CC SOB. Myalgias, malaise, nightsweats began 2 days prior to presentation. Patient began taking leftover levaquin from prior infection x 2 doses. Additionally she took [employer], as well as home albuterol. On Sunday, patient with significantly worse SOB. Denied fever, cough, sore throat, rhinitis, HA, n/v/d, dizziness, changes in appetite.   Patient presents with leukocytosos 18.8. In ED she was given duonebs ans started on abx + steroids. COVID-19 negative. Patient placed on NRB with SpO2 92% Presenting ABG: 7.446/ 26/55/18/19/91   Admitted to IMTS and started on BiPAP. PCCM asked to see 10/12.  Past Medical History  COPD Carpal Tunnel Chronic back pain Gout HLD Diastolic heart dysfunction   Results for KELAHNI, VATTER" (MRN [id number]) as of 02/28/2019 16:47  Ref. Range 05/15/2015 14:25 05/15/2015 16:46 02/19/2019 14:19 02/27/2019 03:03  Anti Nuclear Antibody (ANA) Latest Ref Range: Negative    Negative   ANA Ab, IFA Unknown Negative     ANCA Proteinase 3 Latest Ref Range: 0.0 - 3.5 U/mL <3.5     Anti JO-1 Latest Ref Range: 0.0 - 0.9 AI   <0.2   CCP Antibodies IgG/IgA Latest Ref Range: 0 - 19 units   6 4  Myeloperoxidase Abs Latest Ref Range: 0.0 - 9.0 U/mL <9.0     RA Latex Turbid. Latest Ref Range: 0.0 - 13.9 IU/mL  14.1 (H) 36.6 (H)   Cytoplasmic (C-ANCA) Latest Ref Range: Neg:<1:20 titer   <1:20   P-ANCA Latest Ref Range: Neg:<1:20 titer   <1:20   Atypical P-ANCA titer Latest Ref Range: Neg:<1:20 titer   <1:20   C3 Complement Latest Ref Range: 82 - 167 mg/dL  166    Complement C4,  Body Fluid Latest Ref Range: 14 - 44 mg/dL  30    SSA (Ro) (ENA) Antibody, IgG Latest Ref Range: 0.0 - 0.9 AI   <0.2   SSB (La) (ENA) Antibody, IgG Latest Ref Range: 0.0 - 0.9 AI   <0.2     Significant Hospital Events   10/11 admitted 10/12 PCCM consulted.  Consults:  PCCM  Procedures:   Significant Diagnostic Tests:  10/11 CXR> Chronic interstitial lung disease with new R sided opacities.  10/12 ECHO >>> 10/12 CT angio chest> No PE. enlarged heart, no pericardial effusion. Diffuse ground-glass appearance throughout lungs. No Pleural Effusion. Emphysematous changes.  10/13 ECHO> 1. Left ventricular ejection fraction, by visual estimation, is 55 to 60%. The left ventricle has normal function. Normal left ventricular size. There is no left ventricular hypertrophy.  2. Left ventricular diastolic Doppler parameters are consistent with impaired relaxation pattern of LV diastolic filling.  3. Global right ventricle has severely reduced systolic function.The right ventricular size is mildly enlarged.  4. Left atrial size was mildly dilated.  5. Right atrial size was normal.  6. The mitral valve is normal in structure. Trace mitral valve regurgitation. No evidence of mitral stenosis.  7. The tricuspid valve is normal in structure. Tricuspid valve regurgitation is mild.  8. The aortic valve is tricuspid Aortic valve regurgitation was not visualized by color flow Doppler. Structurally  normal aortic valve, with no evidence of sclerosis or stenosis.  9. The pulmonic valve was not well visualized. Pulmonic valve regurgitation is not visualized by color flow Doppler. 10. Moderately elevated pulmonary artery systolic pressure. 11. The inferior vena cava is dilated in size with >50% respiratory variability, suggesting right atrial pressure of 8 mmHg. 12. Normal LV systolic function; grade 1 diastolic dysfunction; mild LAE; mild RVE with severe RV dysfunction; cannot R/O thrombus in RV on apical images;  suggest CTA to further assess; mild TR; moderate pulmonary hypertension.  10/13 CXR> Chronic diffuse bilateral interstitial prominence, with some interval improvement-- likely improved pulmonary edema \  Echo 10/19:  Left Ventricle: Left ventricular ejection fraction, by visual estimation, is 50 to 55%. The left ventricle has mildly decreased function. There is no left ventricular hypertrophy. Normal left ventricular size. Spectral Doppler shows Left ventricular  diastolic Doppler parameters are consistent with impaired relaxation pattern of LV diastolic filling. Markedly abnormal septal motion. Right Ventricle: The right ventricular size is mildly enlarged. No increase in right ventricular wall thickness. Global RV systolic function is has mildly reduced systolic function.    Micro Data:  10/11 SARS CoV2> neg  10/12 RVP neg  Antimicrobials:  Azithroymycin 10/11-10/13 Rocephin 10/11-10/13  Interim history/subjective:    02/28/2019  - says not better. On 10LNC at rest and as she talks pulse ox is 88%. Says pre-admit was not on o2 but doing well. Says brother in [place] who is 6 years older Brooke Bonito(Mike Lowe) also diagnosed with pulmonary fibrosis in feb 2020. She wants me to call him. Chart review shows RV strain + but she does not have dema.  Chart review shows essentially negative serology  (RF is < 2x ULN). HRCT shows UIP wiuth GGO - pretty c/w UIP flare  Objective   Blood pressure 128/76, pulse 98, temperature 97.9 F (36.6 C), temperature source Oral, resp. rate 20, height 5' (1.524 m), weight 45 kg, SpO2 90 %.        Intake/Output Summary (Last 24 hours) at 02/28/2019 1645 Last data filed at 02/28/2019 1300 Gross per 24 hour  Intake 480 ml  Output 600 ml  Net -120 ml   Filed Weights   02/26/19 0500 02/27/19 0400 02/28/19 0544  Weight: 48.2 kg 46.4 kg 45 kg    General Appearance:  Looks stable but desatruates as she talks Head:  Normocephalic, without obvious abnormality,  atraumatic Eyes:  PERRL - yes, conjunctiva/corneas - clear     Ears:  Normal external ear canals, both ears Nose:  G tube - [place] 10L Throat:  ETT TUBE - no , OG tube - no Neck:  Supple,  No enlargement/tenderness/nodules Lungs:  No distress. Crackles Heart:  S1 and S2 normal, no murmur, CVP - no.  Pressors - no Abdomen:  Soft, no masses, no organomegaly Genitalia / Rectal:  Not done Extremities:  Extremities- intact , dry Skin:  ntact in exposed areas . Sacral area - not examined Neurologic:  Sedation - none -> RASS - +1 . Moves all 4s - yes. CAM-ICU - neg . Orientation - x3+      LABS    PULMONARY No results for input(s): PHART, PCO2ART, PO2ART, HCO3, TCO2, O2SAT in the last 168 hours.  Invalid input(s): PCO2, PO2  CBC Recent Labs  Lab 02/26/19 0529 02/27/19 0303 02/28/19 0452  HGB 17.1* 18.1* 17.4*  HCT 51.5* 52.2* 51.5*  WBC 18.7* 17.9* 16.4*  PLT 391 447* 486*    COAGULATION No results for  input(s): INR in the last 168 hours.  CARDIAC  No results for input(s): TROPONINI in the last 168 hours. No results for input(s): PROBNP in the last 168 hours.   CHEMISTRY Recent Labs  Lab 02/23/19 0408 02/24/19 0503 02/25/19 0718 02/26/19 0529 02/27/19 0303 02/28/19 0452  NA 134* 134*  --  134* 132* 132*  K 3.7 4.3  --  4.2 4.3 3.8  CL 95* 94*  --  97* 92* 94*  CO2 25 28  --  24 26 25   GLUCOSE 111* 98  --  111* 113* 109*  BUN 18 17  --  15 22 20   CREATININE 0.46 0.48 0.66 0.54 0.59 0.55  CALCIUM 8.9 9.3  --  9.3 10.1 9.4   Estimated Creatinine Clearance: 49.1 mL/min (by C-G formula based on SCr of 0.55 mg/dL).   LIVER No results for input(s): AST, ALT, ALKPHOS, BILITOT, PROT, ALBUMIN, INR in the last 168 hours.   INFECTIOUS No results for input(s): LATICACIDVEN, PROCALCITON in the last 168 hours.   ENDOCRINE CBG (last 3)  Recent Labs    02/27/19 0740 02/27/19 1607 02/28/19 1101  GLUCAP 154* 141* 135*      CLINICAL DATA:  Inpatient.  Evaluate  for interstitial lung disease.  EXAM: CT CHEST WITHOUT CONTRAST personally visualzied this cT and agree  TECHNIQUE: Multidetector CT imaging of the chest was performed following the standard protocol without intravenous contrast. High resolution imaging of the lungs, as well as inspiratory and expiratory imaging, was performed.  COMPARISON:  02/19/2019 chest CT angiogram. 02/20/2019 chest radiograph.  FINDINGS: Cardiovascular: Mild cardiomegaly, stable. No significant pericardial effusion/thickening. Left anterior descending coronary atherosclerosis. Atherosclerotic nonaneurysmal thoracic aorta. Stable dilated main pulmonary artery (3.6 cm diameter).  Mediastinum/Nodes: No discrete thyroid nodules. Unremarkable esophagus. No axillary adenopathy. Enlarged 1.3 cm right paratracheal node (series 5/image 63), stable from 02/19/2019 CT. No new pathologically enlarged mediastinal nodes. No discrete hilar adenopathy on this noncontrast scan.  Lungs/Pleura: No pneumothorax. No pleural effusion. No acute consolidative airspace disease or lung masses. Apical left upper lobe 5 mm solid pulmonary nodule (series 6/image 27), stable since 02/19/2019, obscured by streak artifact on 05/15/2015 CT. No additional significant pulmonary nodules. There is extensive patchy confluent ground-glass opacity and reticulation throughout the peripheral lungs bilaterally with associated marked traction bronchiectasis, volume loss and distortion. These findings have a strong basilar predominance. There is moderate honeycombing at both lung bases. Findings have substantially progressed since 05/15/2015 chest CT. No significant lobular air trapping or evidence of tracheobronchomalacia on the expiration sequence.  Upper abdomen: Small hiatal hernia.  Musculoskeletal: No aggressive appearing focal osseous lesions. Moderate thoracic spondylosis.  IMPRESSION: 1. Spectrum of findings compatible with  basilar predominant fibrotic interstitial lung disease with moderate honeycombing. 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 Significant ground-glass component may represent superimposed pulmonary edema or UIP flare. Findings are consistent with UIP per consensus guidelines: Diagnosis of Idiopathic Pulmonary Fibrosis: An Official ATS/ERS/JRS/ALAT Clinical Practice Guideline. Am 07/13/2015 Crit Care Med Vol 198, Iss 5, [phone], Jan 08 2017. 2. Cardiomegaly.  One vessel coronary atherosclerosis. 3. Dilated main pulmonary artery, suggesting pulmonary arterial hypertension. 4. Nonspecific mild mediastinal lymphadenopathy, most likely reactive. 5. Solitary 5 mm solid apical left upper lobe pulmonary nodule. No follow-up needed if patient is low-risk. Non-contrast chest CT can be considered in 12 months if patient is high-risk. This recommendation follows the consensus statement: Guidelines for Management of Incidental Pulmonary Nodules Detected on CT Images:From the Fleischner Society 2017; published online  before print (10.1148/radiol.9030092330). 6. Small hiatal hernia.  Aortic Atherosclerosis (ICD10-I70.0).   Electronically Signed   By: Ilona Sorrel M.D.   On: 02/26/2019 10:33  Swallow eval 2017 IMPRESSION: 1. Moderately degraded exam, as detailed above. 2. Penetration and trace aspiration with single swallows. Please see speech pathology evaluation. 3. Esophageal dysmotility, likely presbyesophagus. 4. No high-grade stricture or other anatomic cause for patient's symptoms within the thoracic esophagus.   Electronically Signed   By: Abigail Miyamoto M.D.   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 (age, family hx - to be confirmed, CT with UIP negative serology - RF elevation is not significant) - very  Likely currently she  has idiopathic flare (not fitting in is the similar presentation in 2017 and long gap after that)  - ither ddx is things like acute eos pneumonia but too sick to bronch or biopsy  - she has evidence of RV strain  Plan  - cards called for Right heart cath  - prognosticate and see if room to diurese - continue prednisone for now -  - will talk to her brother -If ILD confirmed -then raises suspicion for IPF flare even more - if IPF flare up - prognosis is poor (> 90% 3 month mortality -t his is yet to be disclosed )      SIGNATURE    Dr. Brand Males, M.D., F.C.C.P,  Pulmonary and Critical Care Medicine Staff Physician, [place] Director - Interstitial Lung Disease  Program  P[facility] [place] [facility] [place], [place], [address]  Pager: [phone], If no answer or between  15:00h - 7:00h: call [phone] Telephone: [phone]  5:03 PM 02/28/2019

## 2019-02-28 NOTE — Progress Notes (Signed)
Internal Medicine Attending:   I saw and examined the patient. I reviewed the resident's note and I agree with the resident's findings and plan as documented in the resident's note.  Patient states that she feels okay but complains of fatigue.  She stated that she was up in the chair for 2 hours yesterday and work with PT as well.  Patient was initially made to the hospital with acute hypoxic respiratory failure and was found to have ILD (possibly IPF versus ILD secondary to RA).  Patient continues to require a significant amount of oxygen even at rest.  Currently patient is on 10 L high flow nasal cannula with O2 sats in the 90s but desaturates with mild movements.  We will continue prednisone 30 mg daily for now.  PCCM follow-up recommendations appreciated.  Continue DuoNebs 3 times daily.  We will continue to try to wean patient's oxygen down as tolerated.  Palliative care to follow-up for goals of care discussion with patient.

## 2019-02-28 NOTE — Plan of Care (Signed)
  Problem: Education: Goal: Knowledge of General Education information will improve Description Including pain rating scale, medication(s)/side effects and non-pharmacologic comfort measures Outcome: Progressing   Problem: Health Behavior/Discharge Planning: Goal: Ability to manage health-related needs will improve Outcome: Progressing   Problem: Clinical Measurements: Goal: Ability to maintain clinical measurements within normal limits will improve Outcome: Progressing Goal: Will remain free from infection Outcome: Progressing Goal: Diagnostic test results will improve Outcome: Progressing Goal: Respiratory complications will improve Outcome: Progressing Goal: Cardiovascular complication will be avoided Outcome: Progressing   Problem: Activity: Goal: Risk for activity intolerance will decrease Outcome: Progressing   Problem: Coping: Goal: Level of anxiety will decrease Outcome: Progressing   Problem: Pain Managment: Goal: General experience of comfort will improve Outcome: Progressing   Problem: Safety: Goal: Ability to remain free from injury will improve Outcome: Progressing   

## 2019-02-28 NOTE — Progress Notes (Signed)
Subjective:    Julia Massey states she is feeling okay today but tired. She was able to rest last night though. She worked with PT yesterday and was able to sit in the recliner for 2 hours without too much difficulty. She denies any other acute complaints at this time.   We discussed the plan to continue steroids until outpatient follow up with pulm and no taper is scheduled. All questions and concerns addressed.   Objective:  Vital signs in last 24 hours: Vitals:   02/27/19 1653 02/27/19 1928 02/27/19 1933 02/28/19 0544  BP: 129/89 (!) 143/97  117/87  Pulse: 92 93  99  Resp: (!) 24 20  20   Temp: 97.7 F (36.5 C) 97.8 F (36.6 C)  98.8 F (37.1 C)  TempSrc: Oral Oral    SpO2: 98% 93% 96% 97%  Weight:    45 kg  Height:        Physical Exam Vitals signs and nursing note reviewed.  Constitutional:      General: She is not in acute distress.    Appearance: She is well-developed. She is not toxic-appearing.  Cardiovascular:     Rate and Rhythm: Regular rhythm. Tachycardia present.     Heart sounds: No murmur.  Pulmonary:     Effort: Tachypnea present. No accessory muscle usage or respiratory distress.     Breath sounds: Examination of the right-middle field reveals rales. Examination of the left-middle field reveals rales. Examination of the right-lower field reveals rales. Examination of the left-lower field reveals rales. Rales (dry. unchanged from yesterday) present. No decreased breath sounds, wheezing or rhonchi.  Abdominal:     General: Bowel sounds are normal.     Palpations: Abdomen is soft.  Skin:    General: Skin is warm and dry.     Findings: No ecchymosis or rash.  Neurological:     General: No focal deficit present.     Mental Status: She is alert and oriented to person, place, and time.  Psychiatric:        Mood and Affect: Mood normal.        Behavior: Behavior normal.    Assessment/Plan:  Active Problems:   Acute respiratory failure with hypoxemia  (HCC)  # Acute hypoxic respiratory failure  CTA on 10/12 was notable for bibasilar honeycombing, diffuse ground glass opacities, enlarged right ventricle and pulmonary arteries consistent with pulmonary artery hypertension and chronic changes consistent with interstitial lung disease. PCCM was consulted and noted possible ILD. Rheumatologic workup notable for elevated sed rate and positive RF, rest of serology were negative including CCP. High-resolution CT on 10/19 showed significant ground glass opacities and findings consistent with UIP with possible overlying edema vs UIP flare. Without any left heart failure, I feel it is unlikely she has pulmonary edema, so most likely UIP flare. 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 Despite the lack of mortality benefit and possible harm, patient will require steroids to maintain her pulmonary function. No further taper planned.   Continues on 10L HFNC. Given her continued desaturation with mild movements, unsure if she can be weaned any today but will ask nursing to try 9L and assess if it can be tolerated. Her weaning process will likely take a significant amount of time. Palliative still has not seen patient but   - PCCM on board and we appreciate their recommendations - Palliative has been consulted  - Telemetry - HFNC - Wean as tolerated - Duoneb TID  - Prednisone  30mg  QD  # Pulmonary Hypertension # Right Heart Failure  Last echo in 2017 showed grade 1 diastolic dysfunction with preserved EF at 55-60%. BNP on admission was 1170. TTE on 10/12 showed right ventricle has severely reduced systolic function with moderate pulmonary hypertension. Repeat TTE showed continued right heart failure but not as severe as on admission. In addition, marked septal abnormality was noted. Pulm HTN was unable to be assessed. Will need to be careful regarding volume depletion given dependence on preload.    - Telemetry - Strict I/Os  - Daily  weights  #Elevated BP:  Improved significantly. Continue to monitor. No interventions required.   # Secondary Polycythemia  Secondary to chronic hypoxia with hx of chronic lung disease. Hemoglobin stable around 16-17. Will continue to monitor.    # Chronic Back Pain:  Continue home Norco.   - Norco 5-325mg , 1-2 tablets q6h PRN  # Hx of Depression: Continue Prozac   Dispo: Anticipated discharge pending clinical improvement.   Dr. 12/12 Internal Medicine PGY-1  Pager: [phone] 02/28/2019, 6:37 AM

## 2019-02-28 NOTE — Consult Note (Signed)
Advanced Heart Failure Team Consult Note   Primary Physician: Courtney Paris, NP PCP-Cardiologist:  No primary care provider on file.   Referring: Marchelle Gearing   Reason for Consultation: Pulmonary HTN, RV failure  HPI:    Julia Massey is seen today for evaluation of Pulmonary HTN, RV failure at the request of Dr. Marchelle Gearing.   68 y/o woman with h/o COPD and fibromyalgia. Admitted in 2017 for respiratory failure and had prolonged course with VDRF. Subsequently recovered well and was able to be quite active and independent. Gave up smoking.   Admitted with about 1 week of progressive SOB, chest tightness and weakness. Found to have hypoxic respiratory failure. CT negative for PE but suggestive of PAH. Echo EF 60% with severe RV dilation and dysfunction. Hi-res chest CT c/w UIP. Started on steroids.   Remains hypoxic. Now up to 10L [place]. SOB at rest. No edema, syncope. Has chest tightness going up steps or with significant exertion   Review of Systems: [y] = yes, [ ]  = no   . General: Weight gain [ ] ; Weight loss [ ] ; Anorexia [ ] ; Fatigue [ y]; Fever [ ] ; Chills [ ] ; Weakness [ y]  . Cardiac: Chest pain/pressure ]; Resting SOB [ y]; Exertional SOB [ y]; Orthopnea [ ] ; Pedal Edema [ ] ; Palpitations [ ] ; Syncope [ ] ; Presyncope [ ] ; Paroxysmal nocturnal dyspnea[ ]   . Pulmonary: Cough ]; Wheezing[ ] ; Hemoptysis[ ] ; Sputum [ ] ; Snoring [ ]   . GI: Vomiting[ ] ; Dysphagia[ ] ; Melena[ ] ; Hematochezia [ ] ; Heartburn[ ] ; Abdominal pain [ ] ; Constipation [ ] ; Diarrhea [ ] ; BRBPR [ ]   . GU: Hematuria[ ] ; Dysuria [ ] ; Nocturia[ ]   . Vascular: Pain in legs with walking [ ] ; Pain in feet with lying flat [ ] ; Non-healing sores [ ] ; Stroke [ ] ; TIA [ ] ; Slurred speech [ ] ;  . Neuro: Headaches[ y]; Vertigo[ ] ; Seizures[ ] ; Paresthesias[ ] ;Blurred vision [ ] ; Diplopia [ ] ; Vision changes [ ]   . Ortho/Skin: Arthritis [ y]; Joint pain ]; Muscle pain ]; Joint swelling ]; Back Pain [ ] ; Rash [ ]    . Psych: Depression[ ] ; Anxiety[ ]   . Heme: Bleeding problems [ ] ; Clotting disorders [ ] ; Anemia [ ]   . Endocrine: Diabetes [ ] ; Thyroid dysfunction[ ]   Home Medications Prior to Admission medications   Medication Sig Start Date End Date Taking? Authorizing Provider  amphetamine-dextroamphetamine (ADDERALL) 30 MG tablet Take 30 mg by mouth daily.   Yes [provider]  cetirizine (ZYRTEC) 10 MG tablet Take 10 mg by mouth daily as needed for allergies. Reported on 06/18/2015   Yes [provider]  Cyanocobalamin ([employer] B-12) 1000 MCG/15ML LIQD Take 1,000 mcg by mouth daily.    Yes [provider]  FLUoxetine (PROZAC) 40 MG capsule Take 1 capsule (40 mg total) by mouth daily. 06/12/15  Yes Angiulli, , PA-C  folic acid (FOLVITE) 1 MG tablet Take 1 tablet (1 mg total) by mouth daily. 06/12/15  Yes Angiulli, , PA-C  HYDROcodone-acetaminophen (NORCO) 10-325 MG tablet Take 1 tablet by mouth every 6 (six) hours as needed for moderate pain.   Yes [provider]  ibuprofen (ADVIL) 200 MG tablet Take 400 mg by mouth every 6 (six) hours as needed for moderate pain.   Yes [provider]  levofloxacin (LEVAQUIN) 500 MG tablet Take 500 mg by mouth daily.   Yes [provider]  naproxen sodium (ALEVE) 220 MG tablet Take 220 mg by mouth as needed (pain).   Yes [provider]  polyethylene glycol (MIRALAX / GLYCOLAX) packet Take 17 g by mouth daily. Patient not taking: Reported on 06/18/2015 06/12/15   Angiulli, Mcarthur Rossetti, PA-C    Past Medical History: Past Medical History:  Diagnosis Date  . ADHD   . CHF (congestive heart failure) (HCC)   . Chronic pain   . COPD (chronic obstructive pulmonary disease) (HCC)   . COPD exacerbation (HCC)   . GERD (gastroesophageal reflux disease)   . Hypercholesterolemia   . Sleep apnea     Past Surgical History: History reviewed. No pertinent surgical history.  Family History: History reviewed.  No pertinent family history.  Social History: Social History   Socioeconomic History  . Marital status: Married    Spouse name: Not on file  . Number of children: Not on file  . Years of education: Not on file  . Highest education level: Not on file  Occupational History  . Not on file  Social Needs  . Financial resource strain: Not on file  . Food insecurity    Worry: Not on file    Inability: Not on file  . Transportation needs    Medical: Not on file    Non-medical: Not on file  Tobacco Use  . Smoking status: Former Smoker    Packs/day: 0.25    Years: 15.00    Pack years: 3.75    Types: Cigarettes    Quit date: 04/10/2015    Years since quitting: 3.8  . Smokeless tobacco: Never Used  Substance and Sexual Activity  . Alcohol use: Yes    Alcohol/week: 4.0 standard drinks    Types: 2 Glasses of wine, 2 Cans of beer per week  . Drug use: No  . Sexual activity: Not on file  Lifestyle  . Physical activity    Days per week: Not on file    Minutes per session: Not on file  . Stress: Not on file  Relationships  . Social Musician on phone: Not on file    Gets together: Not on file    Attends religious service: Not on file    Active member of club or organization: Not on file    Attends meetings of clubs or organizations: Not on file    Relationship status: Not on file  Other Topics Concern  . Not on file  Social History Narrative  . Not on file    Allergies:  No Known Allergies  Objective:    Vital Signs:   Temp:  [97.5 F (36.4 C)-98.8 F (37.1 C)] 97.9 F (36.6 C) (10/21 1147) Pulse Rate:  [93-100] 98 (10/21 1147) Resp:  [20] 20 (10/21 1147) BP: (117-143)/(76-97) 128/76 (10/21 1147) SpO2:  [90 %-97 %] 90 % (10/21 1517) Weight:  [45 kg] 45 kg (10/21 0544) Last BM Date: 02/19/19(Per patient)  Weight change: Filed Weights   02/26/19 0500 02/27/19 0400 02/28/19 0544  Weight: 48.2 kg 46.4 kg 45 kg    Intake/Output:   Intake/Output  Summary (Last 24 hours) at 02/28/2019 1835 Last data filed at 02/28/2019 1300 Gross per 24 hour  Intake 480 ml  Output 500 ml  Net -20 ml      Physical Exam    General:  Elderly thin mildly SOB with talking HEENT: normal Neck: supple. JVP 9-10 . Carotids 2+ bilat; no bruits. No lymphadenopathy or thyromegaly appreciated. Cor: PMI  nondisplaced. Regular rate & rhythm. + RV lift Lungs: + crackles decrease air movement throguhout no wheeze Abdomen: soft, nontender, nondistended. No hepatosplenomegaly. No bruits or masses. Good bowel sounds. Extremities: no cyanosis, clubbing, rash, edema Neuro: alert & orientedx3, cranial nerves grossly intact. moves all 4 extremities w/o difficulty. Affect pleasant   Telemetry   NSR 90s Personally reviewed   EKG    Sinus rhythm 88. + LVH possible Poor RWP. Anterior TWI. Personally reviewed   Labs   Basic Metabolic Panel: Recent Labs  Lab 02/23/19 0408 02/24/19 0503 02/25/19 9470 02/26/19 0529 02/27/19 0303 02/28/19 0452  NA 134* 134*  --  134* 132* 132*  K 3.7 4.3  --  4.2 4.3 3.8  CL 95* 94*  --  97* 92* 94*  CO2 25 28  --  24 26 25   GLUCOSE 111* 98  --  111* 113* 109*  BUN 18 17  --  15 22 20   CREATININE 0.46 0.48 0.66 0.54 0.59 0.55  CALCIUM 8.9 9.3  --  9.3 10.1 9.4    Liver Function Tests: No results for input(s): AST, ALT, ALKPHOS, BILITOT, PROT, ALBUMIN in the last 168 hours. No results for input(s): LIPASE, AMYLASE in the last 168 hours. No results for input(s): AMMONIA in the last 168 hours.  CBC: Recent Labs  Lab 02/22/19 0459 02/23/19 0408 02/24/19 0503 02/26/19 0529 02/27/19 0303 02/28/19 0452  WBC 13.4* 15.0* 16.6* 18.7* 17.9* 16.4*  NEUTROABS 11.3* 12.1* 12.7*  --  12.9* 11.9*  HGB 16.3* 16.4* 17.9* 17.1* 18.1* 17.4*  HCT 50.2* 48.9* 53.2* 51.5* 52.2* 51.5*  MCV 93.3 92.3 92.7 92.0 90.5 91.0  PLT 360 322 324 391 447* 486*    Cardiac Enzymes: No results for input(s): CKTOTAL, CKMB, CKMBINDEX,  TROPONINI in the last 168 hours.  BNP: BNP (last 3 results) Recent Labs    02/14/2019 1203  BNP 1,175.2*    ProBNP (last 3 results) No results for input(s): PROBNP in the last 8760 hours.   CBG: Recent Labs  Lab 02/26/19 0545 02/27/19 0152 02/27/19 0740 02/27/19 1607 02/28/19 1101  GLUCAP 125* 136* 154* 141* 135*    Coagulation Studies: No results for input(s): LABPROT, INR in the last 72 hours.   Imaging    No results found.   Medications:     Current Medications: . enoxaparin (LOVENOX) injection  40 mg Subcutaneous Q24H  . FLUoxetine  40 mg Oral Daily  . influenza vaccine adjuvanted  0.5 mL Intramuscular Tomorrow-1000  . ipratropium-albuterol  3 mL Nebulization TID  . mouth rinse  15 mL Mouth Rinse q12n4p  . predniSONE  30 mg Oral Daily       Assessment/Plan   1. Acute on chronic cor pulmonale  - echo with evidence of PAH and severe RV dysfunction - Suspect significant pulmonary HTN in setting of advanced lung disease (WHO Group III) - Will plan R/L cath tomorrow - Continue aggressive pulmonary toilet and keep sats >= 88% - d/w Dr. Chase Caller personally.   2. Acute on chronic hypoxic respiratory failure - has underlying COPD with prolonged intubation several years ago but gut back to a very functional baseline - w/u now most c/w UIP and PH - CT negative for PE - Pulmonary managing - continue prednisone and nebs  3. Exertional chest tightness - suspect due to [facility] but has multiple CRFs. Will do coronary angio at time of [place]   Length of Stay: [place], MD  02/28/2019, 6:35 PM  Advanced Heart Failure Team Pager [phone] (M-F; [place])  Please contact [facility] for night-coverage after hours (4p -7a ) and weekends on [email or web address]

## 2019-03-01 ENCOUNTER — Encounter (HOSPITAL_COMMUNITY): Admission: EM | Disposition: E | Payer: Self-pay | Source: Home / Self Care | Attending: Internal Medicine

## 2019-03-01 ENCOUNTER — Encounter (HOSPITAL_COMMUNITY): Payer: Self-pay | Admitting: Internal Medicine

## 2019-03-01 DIAGNOSIS — Z515 Encounter for palliative care: Secondary | ICD-10-CM

## 2019-03-01 DIAGNOSIS — I2510 Atherosclerotic heart disease of native coronary artery without angina pectoris: Secondary | ICD-10-CM

## 2019-03-01 DIAGNOSIS — R531 Weakness: Secondary | ICD-10-CM

## 2019-03-01 DIAGNOSIS — I50810 Right heart failure, unspecified: Secondary | ICD-10-CM | POA: Diagnosis not present

## 2019-03-01 DIAGNOSIS — R69 Illness, unspecified: Secondary | ICD-10-CM | POA: Diagnosis not present

## 2019-03-01 DIAGNOSIS — I272 Pulmonary hypertension, unspecified: Secondary | ICD-10-CM | POA: Diagnosis not present

## 2019-03-01 DIAGNOSIS — M549 Dorsalgia, unspecified: Secondary | ICD-10-CM | POA: Diagnosis not present

## 2019-03-01 DIAGNOSIS — J9601 Acute respiratory failure with hypoxia: Secondary | ICD-10-CM | POA: Diagnosis not present

## 2019-03-01 DIAGNOSIS — M069 Rheumatoid arthritis, unspecified: Secondary | ICD-10-CM | POA: Diagnosis not present

## 2019-03-01 DIAGNOSIS — I10 Essential (primary) hypertension: Secondary | ICD-10-CM | POA: Diagnosis not present

## 2019-03-01 DIAGNOSIS — J849 Interstitial pulmonary disease, unspecified: Secondary | ICD-10-CM | POA: Diagnosis not present

## 2019-03-01 DIAGNOSIS — I2609 Other pulmonary embolism with acute cor pulmonale: Secondary | ICD-10-CM | POA: Diagnosis not present

## 2019-03-01 DIAGNOSIS — G8929 Other chronic pain: Secondary | ICD-10-CM | POA: Diagnosis not present

## 2019-03-01 DIAGNOSIS — R0789 Other chest pain: Secondary | ICD-10-CM | POA: Diagnosis not present

## 2019-03-01 DIAGNOSIS — D751 Secondary polycythemia: Secondary | ICD-10-CM | POA: Diagnosis not present

## 2019-03-01 HISTORY — PX: RIGHT/LEFT HEART CATH AND CORONARY ANGIOGRAPHY: CATH118266

## 2019-03-01 LAB — CBC WITH DIFFERENTIAL/PLATELET
Abs Immature Granulocytes: 0.34 10*3/uL — ABNORMAL HIGH (ref 0.00–0.07)
Basophils Absolute: 0.1 10*3/uL (ref 0.0–0.1)
Basophils Relative: 0 %
Eosinophils Absolute: 1.1 10*3/uL — ABNORMAL HIGH (ref 0.0–0.5)
Eosinophils Relative: 7 %
HCT: 50.6 % — ABNORMAL HIGH (ref 36.0–46.0)
Hemoglobin: 16.7 g/dL — ABNORMAL HIGH (ref 12.0–15.0)
Immature Granulocytes: 2 %
Lymphocytes Relative: 16 %
Lymphs Abs: 2.7 10*3/uL (ref 0.7–4.0)
MCH: 30.3 pg (ref 26.0–34.0)
MCHC: 33 g/dL (ref 30.0–36.0)
MCV: 91.7 fL (ref 80.0–100.0)
Monocytes Absolute: 1.5 10*3/uL — ABNORMAL HIGH (ref 0.1–1.0)
Monocytes Relative: 9 %
Neutro Abs: 11 10*3/uL — ABNORMAL HIGH (ref 1.7–7.7)
Neutrophils Relative %: 66 %
Platelets: 517 10*3/uL — ABNORMAL HIGH (ref 150–400)
RBC: 5.52 MIL/uL — ABNORMAL HIGH (ref 3.87–5.11)
RDW: 11.9 % (ref 11.5–15.5)
WBC: 16.7 10*3/uL — ABNORMAL HIGH (ref 4.0–10.5)
nRBC: 0 % (ref 0.0–0.2)

## 2019-03-01 LAB — POCT I-STAT 7, (LYTES, BLD GAS, ICA,H+H)
Acid-Base Excess: 2 mmol/L (ref 0.0–2.0)
Bicarbonate: 26.1 mmol/L (ref 20.0–28.0)
Calcium, Ion: 1.15 mmol/L (ref 1.15–1.40)
HCT: 50 % — ABNORMAL HIGH (ref 36.0–46.0)
Hemoglobin: 17 g/dL — ABNORMAL HIGH (ref 12.0–15.0)
O2 Saturation: 100 %
Potassium: 4.6 mmol/L (ref 3.5–5.1)
Sodium: 132 mmol/L — ABNORMAL LOW (ref 135–145)
TCO2: 27 mmol/L (ref 22–32)
pCO2 arterial: 37.3 mmHg (ref 32.0–48.0)
pH, Arterial: 7.452 — ABNORMAL HIGH (ref 7.350–7.450)
pO2, Arterial: 270 mmHg — ABNORMAL HIGH (ref 83.0–108.0)

## 2019-03-01 LAB — POCT I-STAT EG7
Acid-Base Excess: 2 mmol/L (ref 0.0–2.0)
Acid-Base Excess: 4 mmol/L — ABNORMAL HIGH (ref 0.0–2.0)
Bicarbonate: 25.3 mmol/L (ref 20.0–28.0)
Bicarbonate: 27.6 mmol/L (ref 20.0–28.0)
Bicarbonate: 29 mmol/L — ABNORMAL HIGH (ref 20.0–28.0)
Calcium, Ion: 0.93 mmol/L — ABNORMAL LOW (ref 1.15–1.40)
Calcium, Ion: 1.08 mmol/L — ABNORMAL LOW (ref 1.15–1.40)
Calcium, Ion: 1.15 mmol/L (ref 1.15–1.40)
HCT: 45 % (ref 36.0–46.0)
HCT: 46 % (ref 36.0–46.0)
HCT: 50 % — ABNORMAL HIGH (ref 36.0–46.0)
Hemoglobin: 15.3 g/dL — ABNORMAL HIGH (ref 12.0–15.0)
Hemoglobin: 15.6 g/dL — ABNORMAL HIGH (ref 12.0–15.0)
Hemoglobin: 17 g/dL — ABNORMAL HIGH (ref 12.0–15.0)
O2 Saturation: 70 %
O2 Saturation: 70 %
O2 Saturation: 70 %
Potassium: 3.8 mmol/L (ref 3.5–5.1)
Potassium: 4.2 mmol/L (ref 3.5–5.1)
Potassium: 4.6 mmol/L (ref 3.5–5.1)
Sodium: 134 mmol/L — ABNORMAL LOW (ref 135–145)
Sodium: 134 mmol/L — ABNORMAL LOW (ref 135–145)
Sodium: 138 mmol/L (ref 135–145)
TCO2: 26 mmol/L (ref 22–32)
TCO2: 29 mmol/L (ref 22–32)
TCO2: 30 mmol/L (ref 22–32)
pCO2, Ven: 40.9 mmHg — ABNORMAL LOW (ref 44.0–60.0)
pCO2, Ven: 45 mmHg (ref 44.0–60.0)
pCO2, Ven: 45.4 mmHg (ref 44.0–60.0)
pH, Ven: 7.392 (ref 7.250–7.430)
pH, Ven: 7.398 (ref 7.250–7.430)
pH, Ven: 7.418 (ref 7.250–7.430)
pO2, Ven: 36 mmHg (ref 32.0–45.0)
pO2, Ven: 36 mmHg (ref 32.0–45.0)
pO2, Ven: 37 mmHg (ref 32.0–45.0)

## 2019-03-01 LAB — BASIC METABOLIC PANEL
Anion gap: 13 (ref 5–15)
BUN: 18 mg/dL (ref 8–23)
CO2: 24 mmol/L (ref 22–32)
Calcium: 9.3 mg/dL (ref 8.9–10.3)
Chloride: 97 mmol/L — ABNORMAL LOW (ref 98–111)
Creatinine, Ser: 0.59 mg/dL (ref 0.44–1.00)
GFR calc Af Amer: >60 mL/min (ref >60)
GFR calc non Af Amer: >60 mL/min (ref >60)
Glucose, Bld: 103 mg/dL — ABNORMAL HIGH (ref 70–99)
Potassium: 3.6 mmol/L (ref 3.5–5.1)
Sodium: 134 mmol/L — ABNORMAL LOW (ref 135–145)

## 2019-03-01 LAB — GLUCOSE, CAPILLARY
Glucose-Capillary: 116 mg/dL — ABNORMAL HIGH (ref 70–99)
Glucose-Capillary: 117 mg/dL — ABNORMAL HIGH (ref 70–99)
Glucose-Capillary: 226 mg/dL — ABNORMAL HIGH (ref 70–99)

## 2019-03-01 LAB — PROCALCITONIN: Procalcitonin: <0.1 ng/mL

## 2019-03-01 SURGERY — RIGHT/LEFT HEART CATH AND CORONARY ANGIOGRAPHY
Anesthesia: LOCAL

## 2019-03-01 MED ORDER — SODIUM CHLORIDE 0.9 % IV SOLN: 250.0000 mL | INTRAVENOUS | Status: DC | PRN

## 2019-03-01 MED ORDER — ASPIRIN 81 MG PO CHEW
81.0000 mg | CHEWABLE_TABLET | Freq: Every day | ORAL | Status: DC
  Administered 2019-03-01 – 2019-03-06 (×6): 81 mg via ORAL
  Filled 2019-03-01 (×7): qty 1

## 2019-03-01 MED ORDER — SODIUM CHLORIDE 0.9 % IV SOLN: INTRAVENOUS | Status: AC

## 2019-03-01 MED ORDER — FENTANYL CITRATE (PF) 100 MCG/2ML IJ SOLN
INTRAMUSCULAR | Status: DC | PRN
  Administered 2019-03-01: 25 ug via INTRAVENOUS

## 2019-03-01 MED ORDER — HEPARIN (PORCINE) IN NACL 1000-0.9 UT/500ML-% IV SOLN
INTRAVENOUS | Status: DC | PRN
  Administered 2019-03-01: 500 mL

## 2019-03-01 MED ORDER — LIDOCAINE HCL (PF) 1 % IJ SOLN
INTRAMUSCULAR | Status: AC
  Filled 2019-03-01: qty 30

## 2019-03-01 MED ORDER — ENOXAPARIN SODIUM 40 MG/0.4ML ~~LOC~~ SOLN
40.0000 mg | SUBCUTANEOUS | Status: DC
  Administered 2019-03-02 – 2019-03-05 (×4): 40 mg via SUBCUTANEOUS
  Filled 2019-03-01 (×5): qty 0.4

## 2019-03-01 MED ORDER — POTASSIUM CHLORIDE CRYS ER 20 MEQ PO TBCR
40.0000 meq | EXTENDED_RELEASE_TABLET | Freq: Once | ORAL | Status: AC
  Administered 2019-03-01: 40 meq via ORAL
  Filled 2019-03-01: qty 4

## 2019-03-01 MED ORDER — HEPARIN (PORCINE) IN NACL 1000-0.9 UT/500ML-% IV SOLN
INTRAVENOUS | Status: AC
  Filled 2019-03-01: qty 1000

## 2019-03-01 MED ORDER — ONDANSETRON HCL 4 MG/2ML IJ SOLN: 4.0000 mg | Freq: Four times a day (QID) | INTRAMUSCULAR | Status: DC | PRN

## 2019-03-01 MED ORDER — METHYLPREDNISOLONE SODIUM SUCC 125 MG IJ SOLR
125.0000 mg | Freq: Four times a day (QID) | INTRAMUSCULAR | Status: AC
  Administered 2019-03-01 – 2019-03-07 (×24): 125 mg via INTRAVENOUS
  Filled 2019-03-01 (×24): qty 2

## 2019-03-01 MED ORDER — SODIUM CHLORIDE 0.9 % IV SOLN
INTRAVENOUS | Status: DC
  Administered 2019-03-01: 07:00:00 via INTRAVENOUS

## 2019-03-01 MED ORDER — ASPIRIN 81 MG PO CHEW
81.0000 mg | CHEWABLE_TABLET | ORAL | Status: AC
  Administered 2019-03-01: 81 mg via ORAL
  Filled 2019-03-01: qty 1

## 2019-03-01 MED ORDER — SODIUM CHLORIDE 0.9% FLUSH
3.0000 mL | INTRAVENOUS | Status: DC | PRN
  Administered 2019-03-02: 3 mL via INTRAVENOUS
  Filled 2019-03-01: qty 3

## 2019-03-01 MED ORDER — SODIUM CHLORIDE 0.9% FLUSH: 3.0000 mL | Freq: Two times a day (BID) | INTRAVENOUS | Status: DC

## 2019-03-01 MED ORDER — ACETAMINOPHEN 325 MG PO TABS: 650.0000 mg | ORAL_TABLET | ORAL | Status: DC | PRN

## 2019-03-01 MED ORDER — LIDOCAINE HCL (PF) 1 % IJ SOLN
INTRAMUSCULAR | Status: DC | PRN
  Administered 2019-03-01: 10 mL
  Administered 2019-03-01: 1 mL
  Administered 2019-03-01: 2 mL

## 2019-03-01 MED ORDER — CLOPIDOGREL BISULFATE 300 MG PO TABS
ORAL_TABLET | ORAL | Status: DC | PRN
  Administered 2019-03-01: 300 mg via ORAL

## 2019-03-01 MED ORDER — HYDRALAZINE HCL 20 MG/ML IJ SOLN: 10.0000 mg | INTRAMUSCULAR | Status: AC | PRN

## 2019-03-01 MED ORDER — SODIUM CHLORIDE 0.9% FLUSH
3.0000 mL | Freq: Two times a day (BID) | INTRAVENOUS | Status: DC
  Administered 2019-03-01 – 2019-03-12 (×15): 3 mL via INTRAVENOUS

## 2019-03-01 MED ORDER — VERAPAMIL HCL 2.5 MG/ML IV SOLN
INTRAVENOUS | Status: AC
  Filled 2019-03-01: qty 2

## 2019-03-01 MED ORDER — IOHEXOL 350 MG/ML SOLN
INTRAVENOUS | Status: DC | PRN
  Administered 2019-03-01: 40 mL

## 2019-03-01 MED ORDER — LABETALOL HCL 5 MG/ML IV SOLN: 10.0000 mg | INTRAVENOUS | Status: AC | PRN

## 2019-03-01 MED ORDER — FENTANYL CITRATE (PF) 100 MCG/2ML IJ SOLN
INTRAMUSCULAR | Status: AC
  Filled 2019-03-01: qty 2

## 2019-03-01 MED ORDER — CLOPIDOGREL BISULFATE 300 MG PO TABS
ORAL_TABLET | ORAL | Status: AC
  Filled 2019-03-01: qty 1

## 2019-03-01 MED ORDER — CLOPIDOGREL BISULFATE 75 MG PO TABS
75.0000 mg | ORAL_TABLET | Freq: Every day | ORAL | Status: DC
  Administered 2019-03-02 – 2019-03-06 (×5): 75 mg via ORAL
  Filled 2019-03-01 (×5): qty 1

## 2019-03-01 MED ORDER — SODIUM CHLORIDE 0.9% FLUSH: 3.0000 mL | INTRAVENOUS | Status: DC | PRN

## 2019-03-01 MED ORDER — HEPARIN SODIUM (PORCINE) 1000 UNIT/ML IJ SOLN
INTRAMUSCULAR | Status: AC
  Filled 2019-03-01: qty 1

## 2019-03-01 SURGICAL SUPPLY — 10 items
CATH BALLN WEDGE 5F 110CM (CATHETERS) ×1 IMPLANT
CATH DXT MULTI JL4 JR4 ANG PIG (CATHETERS) ×1 IMPLANT
GLIDESHEATH SLEND SS 6F .021 (SHEATH) IMPLANT
GUIDEWIRE .025 260CM (WIRE) ×1 IMPLANT
PACK CARDIAC CATHETERIZATION (CUSTOM PROCEDURE TRAY) ×2 IMPLANT
SHEATH GLIDE SLENDER 4/5FR (SHEATH) ×1 IMPLANT
SHEATH PINNACLE 5F 10CM (SHEATH) ×1 IMPLANT
SHEATH PROBE COVER 6X72 (BAG) ×1 IMPLANT
TRANSDUCER W/STOPCOCK (MISCELLANEOUS) ×2 IMPLANT
WIRE EMERALD 3MM-J .035X150CM (WIRE) ×1 IMPLANT

## 2019-03-01 NOTE — H&P (View-Only) (Signed)
Advanced Heart Failure Rounding Note  PCP-Cardiologist: No primary care provider on file.   Subjective:    Admitted with progressive SOB. Plan for RHC/LHC today.  Remains SOB with exertion. Denies chest pain.    Objective:   Weight Range: 67 kg Body mass index is 23.84 kg/m.   Vital Signs:   Temp:  [97.5 F (36.4 C)-99.1 F (37.3 C)] 97.6 F (36.4 C) (10/22 0459) Pulse Rate:  [88-100] 90 (10/22 0459) Resp:  [18-20] 20 (10/22 0459) BP: (110-158)/(76-95) 110/78 (10/22 0459) SpO2:  [90 %-96 %] 91 % (10/22 0459) Weight:  [67 kg-68.9 kg] 67 kg (10/22 0459) Last BM Date: 02/19/19(Per patient)  Weight change: Filed Weights   02/28/19 0544 02/28/19 1846 02/25/2019 0459  Weight: 45 kg 68.9 kg 67 kg    Intake/Output:   Intake/Output Summary (Last 24 hours) at 02/28/2019 0724 Last data filed at 02/26/2019 0002 Gross per 24 hour  Intake 120 ml  Output 500 ml  Net -380 ml      Physical Exam    General:   Elderly. Frail No resp difficulty HEENT: Normal Neck: Supple. JVP flat . Carotids 2+ bilat; no bruits. No lymphadenopathy or thyromegaly appreciated. Cor: PMI nondisplaced. Regular rate & rhythm. No rubs, gallops or murmurs. Lungs: +crackles  on 10 liter HFNC  No wheeze Abdomen: Soft, nontender, nondistended. No hepatosplenomegaly. No bruits or masses. Good bowel sounds. Extremities: no cyanosis, clubbing, rash, edema Neuro: alert & oriented x 3, cranial nerves grossly intact. moves all 4 extremities w/o difficulty. Affect pleasant   Telemetry  NSR 90s personally reviewed.   EKG    N/A   Labs    CBC Recent Labs    02/28/19 0452 02/22/2019 0445  WBC 16.4* 16.7*  NEUTROABS 11.9* 11.0*  HGB 17.4* 16.7*  HCT 51.5* 50.6*  MCV 91.0 91.7  PLT 486* 254*   Basic Metabolic Panel Recent Labs    02/28/19 0452 02/28/2019 0445  NA 132* 134*  K 3.8 3.6  CL 94* 97*  CO2 25 24  GLUCOSE 109* 103*  BUN 20 18  CREATININE 0.55 0.59  CALCIUM 9.4 9.3   Liver  Function Tests No results for input(s): AST, ALT, ALKPHOS, BILITOT, PROT, ALBUMIN in the last 72 hours. No results for input(s): LIPASE, AMYLASE in the last 72 hours. Cardiac Enzymes No results for input(s): CKTOTAL, CKMB, CKMBINDEX, TROPONINI in the last 72 hours.  BNP: BNP (last 3 results) Recent Labs    03/09/2019 1203  BNP 1,175.2*    ProBNP (last 3 results) No results for input(s): PROBNP in the last 8760 hours.   D-Dimer No results for input(s): DDIMER in the last 72 hours. Hemoglobin A1C No results for input(s): HGBA1C in the last 72 hours. Fasting Lipid Panel No results for input(s): CHOL, HDL, LDLCALC, TRIG, CHOLHDL, LDLDIRECT in the last 72 hours. Thyroid Function Tests No results for input(s): TSH, T4TOTAL, T3FREE, THYROIDAB in the last 72 hours.  Invalid input(s): FREET3  Other results:   Imaging     No results found.   Medications:     Scheduled Medications: . enoxaparin (LOVENOX) injection  40 mg Subcutaneous Q24H  . FLUoxetine  40 mg Oral Daily  . influenza vaccine adjuvanted  0.5 mL Intramuscular Tomorrow-1000  . ipratropium-albuterol  3 mL Nebulization TID  . mouth rinse  15 mL Mouth Rinse q12n4p  . predniSONE  30 mg Oral Daily  . sodium chloride flush  3 mL Intravenous Q12H     Infusions: .  sodium chloride    . sodium chloride 10 mL/hr at 03/07/2019 0650     PRN Medications:  sodium chloride, acetaminophen **OR** acetaminophen, albuterol, calcium carbonate, hydrALAZINE, HYDROcodone-acetaminophen, senna-docusate, sodium chloride, sodium chloride flush   Assessment/Plan    1. Acute on chronic cor pulmonale  - echo with evidence of PAH and severe RV dysfunction - Suspect significant pulmonary HTN in setting of advanced lung disease (WHO Group III) - Will plan R/L cath today - Continue aggressive pulmonary toilet and keep sats >= 88%. Sats on 10 liters HFNC stable.   2. Acute on chronic hypoxic respiratory failure - has underlying  COPD with prolonged intubation several years ago but gut back to a very functional baseline - w/u now most c/w UIP and PH - CT negative for PE - Pulmonary managing - continue prednisone and nebs  3. Exertional chest tightness - suspect due to [facility] but has multiple CRFs. Will do coronary angio at time of RHC  4. ID  Elevated WBC . On prednisone. Afebrile.   Length of Stay: 11  Amy Clegg, NP  02/15/2019, 7:24 AM  Advanced Heart Failure Team Pager [phone] (M-F; 7a - 4p)  Please contact [facility] for night-coverage after hours (4p -7a ) and weekends on [email or web address]  Patient seen and examined with the above-signed Advanced Practice Provider and/or Housestaff. I personally reviewed laboratory data, imaging studies and relevant notes. I independently examined the patient and formulated the important aspects of the plan. I have edited the note to reflect any of my changes or salient points. I have personally discussed the plan with the patient and/or family.  Respiratory status remains tenuous though some improvement with steroids. RV severely dysfunctional on echo. Chest tightness improved with improvement in respiratory status.   Planning R/L HC today to more fully evaluate for [facility] and CAD. D/w Dr. Marchelle Gearing personally this am.   Arvilla Meres, MD  8:19 AM

## 2019-03-01 NOTE — Progress Notes (Signed)
Site area: Right groin a 5 french arterial sheath was removed by Theora Gianotti RN  Site Prior to Removal:  Level 0  Pressure Applied For 20 MINUTES    Bedrest Beginning at 1145am  Manual:   Yes.    Patient Status During Pull:  stable  Post Pull Groin Site:  Level 0  Post Pull Instructions Given:  Yes.    Post Pull Pulses Present:  Yes.    Dressing Applied:  Yes.    Comments:  Vs remain stable

## 2019-03-01 NOTE — Progress Notes (Addendum)
Advanced Heart Failure Rounding Note  PCP-Cardiologist: No primary care provider on file.   Subjective:    Admitted with progressive SOB. Plan for RHC/LHC today.  Remains SOB with exertion. Denies chest pain.    Objective:   Weight Range: 67 kg Body mass index is 23.84 kg/m.   Vital Signs:   Temp:  [97.5 F (36.4 C)-99.1 F (37.3 C)] 97.6 F (36.4 C) (10/22 0459) Pulse Rate:  [88-100] 90 (10/22 0459) Resp:  [18-20] 20 (10/22 0459) BP: (110-158)/(76-95) 110/78 (10/22 0459) SpO2:  [90 %-96 %] 91 % (10/22 0459) Weight:  [67 kg-68.9 kg] 67 kg (10/22 0459) Last BM Date: 02/19/19(Per patient)  Weight change: Filed Weights   02/28/19 0544 02/28/19 1846 02/25/2019 0459  Weight: 45 kg 68.9 kg 67 kg    Intake/Output:   Intake/Output Summary (Last 24 hours) at 02/28/2019 0724 Last data filed at 02/26/2019 0002 Gross per 24 hour  Intake 120 ml  Output 500 ml  Net -380 ml      Physical Exam    General:   Elderly. Frail No resp difficulty HEENT: Normal Neck: Supple. JVP flat . Carotids 2+ bilat; no bruits. No lymphadenopathy or thyromegaly appreciated. Cor: PMI nondisplaced. Regular rate & rhythm. No rubs, gallops or murmurs. Lungs: +crackles  on 10 liter HFNC  No wheeze Abdomen: Soft, nontender, nondistended. No hepatosplenomegaly. No bruits or masses. Good bowel sounds. Extremities: no cyanosis, clubbing, rash, edema Neuro: alert & oriented x 3, cranial nerves grossly intact. moves all 4 extremities w/o difficulty. Affect pleasant   Telemetry  NSR 90s personally reviewed.   EKG    N/A   Labs    CBC Recent Labs    02/28/19 0452 02/22/2019 0445  WBC 16.4* 16.7*  NEUTROABS 11.9* 11.0*  HGB 17.4* 16.7*  HCT 51.5* 50.6*  MCV 91.0 91.7  PLT 486* 254*   Basic Metabolic Panel Recent Labs    02/28/19 0452 02/28/2019 0445  NA 132* 134*  K 3.8 3.6  CL 94* 97*  CO2 25 24  GLUCOSE 109* 103*  BUN 20 18  CREATININE 0.55 0.59  CALCIUM 9.4 9.3   Liver  Function Tests No results for input(s): AST, ALT, ALKPHOS, BILITOT, PROT, ALBUMIN in the last 72 hours. No results for input(s): LIPASE, AMYLASE in the last 72 hours. Cardiac Enzymes No results for input(s): CKTOTAL, CKMB, CKMBINDEX, TROPONINI in the last 72 hours.  BNP: BNP (last 3 results) Recent Labs    03/09/2019 1203  BNP 1,175.2*    ProBNP (last 3 results) No results for input(s): PROBNP in the last 8760 hours.   D-Dimer No results for input(s): DDIMER in the last 72 hours. Hemoglobin A1C No results for input(s): HGBA1C in the last 72 hours. Fasting Lipid Panel No results for input(s): CHOL, HDL, LDLCALC, TRIG, CHOLHDL, LDLDIRECT in the last 72 hours. Thyroid Function Tests No results for input(s): TSH, T4TOTAL, T3FREE, THYROIDAB in the last 72 hours.  Invalid input(s): FREET3  Other results:   Imaging     No results found.   Medications:     Scheduled Medications: . enoxaparin (LOVENOX) injection  40 mg Subcutaneous Q24H  . FLUoxetine  40 mg Oral Daily  . influenza vaccine adjuvanted  0.5 mL Intramuscular Tomorrow-1000  . ipratropium-albuterol  3 mL Nebulization TID  . mouth rinse  15 mL Mouth Rinse q12n4p  . predniSONE  30 mg Oral Daily  . sodium chloride flush  3 mL Intravenous Q12H     Infusions: .  sodium chloride    . sodium chloride 10 mL/hr at 03/02/2019 0650     PRN Medications:  sodium chloride, acetaminophen **OR** acetaminophen, albuterol, calcium carbonate, hydrALAZINE, HYDROcodone-acetaminophen, senna-docusate, sodium chloride, sodium chloride flush   Assessment/Plan    1. Acute on chronic cor pulmonale  - echo with evidence of PAH and severe RV dysfunction - Suspect significant pulmonary HTN in setting of advanced lung disease (WHO Group III) - Will plan R/L cath today - Continue aggressive pulmonary toilet and keep sats >= 88%. Sats on 10 liters HFNC stable.   2. Acute on chronic hypoxic respiratory failure - has underlying  COPD with prolonged intubation several years ago but gut back to a very functional baseline - w/u now most c/w UIP and PH - CT negative for PE - Pulmonary managing - continue prednisone and nebs  3. Exertional chest tightness - suspect due to [facility] but has multiple CRFs. Will do coronary angio at time of RHC  4. ID  Elevated WBC . On prednisone. Afebrile.   Length of Stay: 11  Amy Clegg, NP  02/18/2019, 7:24 AM  Advanced Heart Failure Team Pager [phone] (M-F; 7a - 4p)  Please contact [facility] for night-coverage after hours (4p -7a ) and weekends on [email or web address]  Patient seen and examined with the above-signed Advanced Practice Provider and/or Housestaff. I personally reviewed laboratory data, imaging studies and relevant notes. I independently examined the patient and formulated the important aspects of the plan. I have edited the note to reflect any of my changes or salient points. I have personally discussed the plan with the patient and/or family.  Respiratory status remains tenuous though some improvement with steroids. RV severely dysfunctional on echo. Chest tightness improved with improvement in respiratory status.   Planning R/L HC today to more fully evaluate for [facility] and CAD. D/w Dr. Marchelle Gearing personally this am.   Arvilla Meres, MD  8:19 AM

## 2019-03-01 NOTE — Progress Notes (Signed)
Physical Therapy Treatment Patient Details Name: Julia Massey MRN: [id number] DOB: June 04, 1950 Today's Date: 02/18/2019    History of Present Illness Pt is a 68 y/o female admitted secondary to acute hypoxic respiratory failure likely secondary to acute pneumonitis. Pt placed on BiPap which has since been removed. PMH includes COPD and gout. Pt now s/p heart cath on 10/22    PT Comments    Pt limited by fatigue during session, declining functional mobility but agreeable to therapeutic exercise. Pt performing HEP with good form, taking rest breaks mid-set due to SOB, but recovers quickly. Pt demonstrates good strength and LE power, yet continues to be limited by pulmonary function. Pt will continue to benefit from PT POC to improve activity tolerance and independence. PT continues to recommend home health PT and a RW pending pt progress with activity tolerance.   Follow Up Recommendations  Home health PT(HHPT pending progress with activity tolerance)     Equipment Recommendations  Rolling walker with 5" wheels(TBD, may benefit from rollator pending mobility progress)    Recommendations for Other Services       Precautions / Restrictions Precautions Precautions: Fall Precaution Comments: Watch O2 sats Restrictions Weight Bearing Restrictions: No    Mobility  Bed Mobility Overal bed mobility: Modified Independent             General bed mobility comments: pulls up in bed with bed rails to get to top of bed, declines further bed mobility 2/2 fatigue  Transfers                    Ambulation/Gait                 Stairs             Wheelchair Mobility    Modified Rankin (Stroke Patients Only)       Balance                                            Cognition Arousal/Alertness: Awake/alert Behavior During Therapy: WFL for tasks assessed/performed Overall Cognitive Status: Within Functional Limits for tasks assessed                                         Exercises General Exercises - Lower Extremity Hip ABduction/ADduction: AROM;Both;10 reps Straight Leg Raises: AROM;Both;10 reps(RLE hip flexion limited 2/2 recent heart cath) Other Exercises Other Exercises: Bridges- 10 reps regular and 10 reps with R foot extended placing increased work load on LLE Other Exercises: Clam shells on left side- 2 reps for technique    General Comments General comments (skin integrity, edema, etc.): pt on 15L HFNC, SpO2 ranging from 88-95%. Pt with increased work of breathing at times but recovers quickly with cessation of exercise.      Pertinent Vitals/Pain Pain Assessment: No/denies pain    Home Living                      Prior Function            PT Goals (current goals can now be found in the care plan section) Acute Rehab PT Goals Patient Stated Goal: to be able to breathe better Progress towards PT goals: Progressing toward goals    Frequency  Min 3X/week      PT Plan Current plan remains appropriate    Co-evaluation              AM-PAC PT "6 Clicks" Mobility   Outcome Measure  Help needed turning from your back to your side while in a flat bed without using bedrails?: None Help needed moving from lying on your back to sitting on the side of a flat bed without using bedrails?: None Help needed moving to and from a bed to a chair (including a wheelchair)?: A Little Help needed standing up from a chair using your arms (e.g., wheelchair or bedside chair)?: A Little Help needed to walk in hospital room?: A Little Help needed climbing 3-5 steps with a railing? : A Lot 6 Click Score: 19    End of Session Equipment Utilized During Treatment: Oxygen Activity Tolerance: Patient limited by fatigue Patient left: in bed;with call bell/phone within reach Nurse Communication: Mobility status PT Visit Diagnosis: Muscle weakness (generalized) (M62.81);Difficulty in  walking, not elsewhere classified (R26.2)     Time: 7371-0626 PT Time Calculation (min) (ACUTE ONLY): 9 min  Charges:  $Therapeutic Exercise: 8-22 mins                     Zenaida Niece, PT, DPT Acute Rehabilitation Pager: [phone]    Zenaida Niece 17-Mar-2019, 4:49 PM

## 2019-03-01 NOTE — Progress Notes (Signed)
D/w resident who gave me the [place] and [facility] report  1/ CCM will round again 03/02/19  2.Outlook for resp failure is uncertain. 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 [place] etiologies are lower. Plan: wil do solumedrol at higher dose  3. If patient can lie flat - she should go for stent instead of waiting for hypoxemia to improve     SIGNATURE    Dr. Brand Males, M.D., F.C.C.P,  Pulmonary and Critical Care Medicine Staff Physician, [place] Director - Interstitial Lung Disease  Program  Pulmonary [place] [facility] [place], [place], [address]  Pager: [phone], If no answer or between  15:00h - 7:00h: call [phone] Telephone: [phone]  6:24 PM 03/30/2019

## 2019-03-01 NOTE — Progress Notes (Signed)
Spoke with patient's brother this afternoon, Julia Massey, who reports that approximately 1 month ago, renal CT incidentally found evidence of interstitial fibrosis on his lungs. His PCP referred him to pulmonologist Dr. Kathrynn Ducking at [facility] in [place], [place], who performed a BAL with biopsy that was negative for fibrosis. Repeat CT afterwards was negative as well. He has scheduled PFTs for further evaluation. Mr. Julia Massey is unsure of any other pulmonary disease in the family. Informed we can call back if we have any other questions.   Brother: Julia Massey [phone]

## 2019-03-01 NOTE — Progress Notes (Signed)
Subjective:    [name] reports she is doing okay this morning. She is looking forward to getting more information about how her heart is doing with the cath planned this morning. No other acute complaints today.   We discussed her brother's pulmonary disease today and she states that he said he has the same thing as her. He apparently had a bronchoscopy to confirm the diagnosis. She states that lung disease runs in her family. She is unsure if PCCM has spoken with her brother directly yet though.   Objective:  Vital signs in last 24 hours: Vitals:   02/28/19 1517 02/28/19 1846 02/28/19 1959 03/10/2019 0459  BP:  (!) 158/77 (!) 138/95 110/78  Pulse:  88 92 90  Resp:  18 20 20   Temp:  99.1 F (37.3 C) 98 F (36.7 C) 97.6 F (36.4 C)  TempSrc:  Oral Oral Oral  SpO2: 90% 92% 95% 91%  Weight:  68.9 kg  67 kg  Height:  5\' 6"  (1.676 m)      Physical Exam Vitals signs and nursing note reviewed.  Constitutional:      General: She is not in acute distress.    Appearance: She is well-developed. She is not toxic-appearing.  Cardiovascular:     Rate and Rhythm: Normal rate and regular rhythm.     Heart sounds: No murmur.  Pulmonary:     Effort: No tachypnea, accessory muscle usage or respiratory distress.     Breath sounds: Rales (bilateral in all lung fields, more so in the lower lobes though. Dry in nature, unchanged from previous examination) present. No decreased breath sounds, wheezing or rhonchi.  Abdominal:     General: Bowel sounds are normal.     Palpations: Abdomen is soft.  Skin:    General: Skin is warm and dry.     Findings: No ecchymosis or rash.  Neurological:     General: No focal deficit present.     Mental Status: She is alert and oriented to person, place, and time.  Psychiatric:        Mood and Affect: Mood normal.        Behavior: Behavior normal.    Assessment/Plan:  Active Problems:   Acute respiratory failure with hypoxemia (HCC)  # Acute hypoxic  respiratory failure  CTA on 10/12 was notable for bibasilar honeycombing, diffuse ground glass opacities, enlarged right ventricle and pulmonary arteries consistent with pulmonary artery hypertension and chronic changes consistent with interstitial lung disease. PCCM was consulted and noted possible ILD. Rheumatologic workup notable for elevated sed rate and positive RF, rest of serology were negative including CCP. High-resolution CT on 10/19 showed significant ground glass opacities and findings consistent with UIP with possible overlying edema vs UIP flare. Without any left heart failure, I feel it is unlikely she has pulmonary edema, so most likely UIP flare. 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 Despite the lack of mortality benefit and possible harm, patient will require steroids to maintain her pulmonary function. No further taper planned.   Continues to be on HFNC @ 10L but our medical student noted that she is not desatting as much with movement. Unfortunately, per PCCM note, if this is a true IPF flare there is a high mortality rate. We have not discussed this explicitly yet and will hold off until diagnosis is more concrete. She did meet with palliative, but per her brother's recommendations, she does not want to be DNI/DNR.   - PCCM on  board and we appreciate their recommendations - Telemetry - HFNC @ 10L - Wean as tolerated - Duoneb TID  - Prednisone 30mg  QD  # Pulmonary Hypertension # Right Heart Failure  Last echo in 2017 showed grade 1 diastolic dysfunction with preserved EF at 55-60%. BNP on this admission was 1170. TTE on 10/12 showed right ventricle has severely reduced systolic function with moderate pulmonary hypertension. Repeat TTE (as recommended by PCCM when patient more stable) today showed continued right heart failure but not as severe as prior. In addition, marked septal abnormality was noted. Pulm HTN was unable to be assessed. Will need to be  careful regarding volume depletion given dependence on preload.  PCCM has consulted cards for heart cath and coronary angio scheduled for today. Will follow up results.   - Telemetry - Strict I/Os  - Daily weights - Heart cath (L/R) with angio today  #Elevated BP:  Resolved. Continue to monitor.   # Secondary Polycythemia  Likely secondary to chronic hypoxia with hx of chronic lung disease. Hemoglobin stable around 16-17. Will continue to monitor.    # Chronic Back Pain:  Restarted home Norco.   - Norco 5-325mg , 1-2 tablets q6h PRN  # Hx of Depression: Continue Prozac   Dispo: Anticipated discharge pending clinical improvement.   Dr. 12/12 Internal Medicine PGY-1  Pager: [phone] 03-08-19, 6:51 AM

## 2019-03-01 NOTE — Progress Notes (Signed)
Internal Medicine Attending:   I saw and examined the patient. I reviewed the resident's note and I agree with the resident's findings and plan as documented in the resident's note.  Patient states that she feels okay this morning but still has some dyspnea on exertion as well as while sitting up.  Patient initially made to the hospital with acute hypoxic respiratory failure and was found to have likely ILD.  The etiology of her ILD remains uncertain at this time possibly secondary to IPF.  PCCM follow-up and recommendations appreciated.  We will continue with steroids at current dose (prednisone 30 mg daily).  Cardiology follow-up and recommendations appreciated.  Patient is now status post left and right heart cath which showed 80% stenosis in the LAD as well as low RV pressure consistent with preserved RV function.  Patient to get a stent for her LAD once respiratory status improves.  We will also follow-up cardiac MRI for further evaluation of RV function.  No further work-up at this time.  We will continue to monitor closely.

## 2019-03-01 NOTE — Progress Notes (Signed)
Patient received back alert,oriented and talking appropriately she is asking for a pain pill. She conts with dyspenea at times with any exertion or prolonged talking she desats. She informed me would be going for a stent due to blockage she has at a later time. Appetite good asking for food and drink new diet orders noted. Vascular site right groin with dressing clean dry and intact.

## 2019-03-01 NOTE — Interval H&P Note (Signed)
History and Physical Interval Note:  02/21/2019 9:29 AM  Julia Massey  has presented today for surgery, with the diagnosis of heart failure.  The various methods of treatment have been discussed with the patient and family. After consideration of risks, benefits and other options for treatment, the patient has consented to  Procedure(s): RIGHT/LEFT HEART CATH AND CORONARY ANGIOGRAPHY (N/A) and possible coronary angioplasty as a surgical intervention.  The patient's history has been reviewed, patient examined, no change in status, stable for surgery.  I have reviewed the patient's chart and labs.  Questions were answered to the patient's satisfaction.     Daniel Bensimhon

## 2019-03-02 ENCOUNTER — Inpatient Hospital Stay (HOSPITAL_COMMUNITY): Payer: Medicare HMO

## 2019-03-02 DIAGNOSIS — R079 Chest pain, unspecified: Secondary | ICD-10-CM

## 2019-03-02 DIAGNOSIS — J9621 Acute and chronic respiratory failure with hypoxia: Secondary | ICD-10-CM | POA: Diagnosis not present

## 2019-03-02 DIAGNOSIS — I50810 Right heart failure, unspecified: Secondary | ICD-10-CM | POA: Diagnosis not present

## 2019-03-02 DIAGNOSIS — M069 Rheumatoid arthritis, unspecified: Secondary | ICD-10-CM | POA: Diagnosis not present

## 2019-03-02 DIAGNOSIS — I10 Essential (primary) hypertension: Secondary | ICD-10-CM | POA: Diagnosis not present

## 2019-03-02 DIAGNOSIS — D751 Secondary polycythemia: Secondary | ICD-10-CM | POA: Diagnosis not present

## 2019-03-02 DIAGNOSIS — I2781 Cor pulmonale (chronic): Secondary | ICD-10-CM

## 2019-03-02 DIAGNOSIS — J9601 Acute respiratory failure with hypoxia: Secondary | ICD-10-CM | POA: Diagnosis not present

## 2019-03-02 DIAGNOSIS — I2510 Atherosclerotic heart disease of native coronary artery without angina pectoris: Secondary | ICD-10-CM | POA: Diagnosis not present

## 2019-03-02 DIAGNOSIS — I272 Pulmonary hypertension, unspecified: Secondary | ICD-10-CM | POA: Diagnosis not present

## 2019-03-02 DIAGNOSIS — R0600 Dyspnea, unspecified: Secondary | ICD-10-CM | POA: Diagnosis not present

## 2019-03-02 DIAGNOSIS — G8929 Other chronic pain: Secondary | ICD-10-CM | POA: Diagnosis not present

## 2019-03-02 DIAGNOSIS — M549 Dorsalgia, unspecified: Secondary | ICD-10-CM | POA: Diagnosis not present

## 2019-03-02 DIAGNOSIS — J849 Interstitial pulmonary disease, unspecified: Secondary | ICD-10-CM | POA: Diagnosis not present

## 2019-03-02 LAB — CBC WITH DIFFERENTIAL/PLATELET
Abs Immature Granulocytes: 0.16 10*3/uL — ABNORMAL HIGH (ref 0.00–0.07)
Basophils Absolute: 0 10*3/uL (ref 0.0–0.1)
Basophils Relative: 0 %
Eosinophils Absolute: 0 10*3/uL (ref 0.0–0.5)
Eosinophils Relative: 0 %
HCT: 53.1 % — ABNORMAL HIGH (ref 36.0–46.0)
Hemoglobin: 17.8 g/dL — ABNORMAL HIGH (ref 12.0–15.0)
Immature Granulocytes: 1 %
Lymphocytes Relative: 4 %
Lymphs Abs: 0.6 10*3/uL — ABNORMAL LOW (ref 0.7–4.0)
MCH: 30.7 pg (ref 26.0–34.0)
MCHC: 33.5 g/dL (ref 30.0–36.0)
MCV: 91.7 fL (ref 80.0–100.0)
Monocytes Absolute: 0.2 10*3/uL (ref 0.1–1.0)
Monocytes Relative: 1 %
Neutro Abs: 14.8 10*3/uL — ABNORMAL HIGH (ref 1.7–7.7)
Neutrophils Relative %: 94 %
Platelets: 424 10*3/uL — ABNORMAL HIGH (ref 150–400)
RBC: 5.79 MIL/uL — ABNORMAL HIGH (ref 3.87–5.11)
RDW: 12 % (ref 11.5–15.5)
WBC: 15.8 10*3/uL — ABNORMAL HIGH (ref 4.0–10.5)
nRBC: 0 % (ref 0.0–0.2)

## 2019-03-02 LAB — BASIC METABOLIC PANEL
Anion gap: 14 (ref 5–15)
BUN: 17 mg/dL (ref 8–23)
CO2: 23 mmol/L (ref 22–32)
Calcium: 9.4 mg/dL (ref 8.9–10.3)
Chloride: 97 mmol/L — ABNORMAL LOW (ref 98–111)
Creatinine, Ser: 0.46 mg/dL (ref 0.44–1.00)
GFR calc Af Amer: >60 mL/min (ref >60)
GFR calc non Af Amer: >60 mL/min (ref >60)
Glucose, Bld: 165 mg/dL — ABNORMAL HIGH (ref 70–99)
Potassium: 4.5 mmol/L (ref 3.5–5.1)
Sodium: 134 mmol/L — ABNORMAL LOW (ref 135–145)

## 2019-03-02 LAB — GLUCOSE, CAPILLARY
Glucose-Capillary: 148 mg/dL — ABNORMAL HIGH (ref 70–99)
Glucose-Capillary: 219 mg/dL — ABNORMAL HIGH (ref 70–99)

## 2019-03-02 LAB — PROCALCITONIN: Procalcitonin: <0.1 ng/mL

## 2019-03-02 MED ORDER — GADOBUTROL 1 MMOL/ML IV SOLN
7.0000 mL | Freq: Once | INTRAVENOUS | Status: AC | PRN
  Administered 2019-03-02: 7 mL via INTRAVENOUS

## 2019-03-02 MED ORDER — ATORVASTATIN CALCIUM 80 MG PO TABS
80.0000 mg | ORAL_TABLET | Freq: Every day | ORAL | Status: DC
  Administered 2019-03-02 – 2019-03-12 (×11): 80 mg via ORAL
  Filled 2019-03-02 (×11): qty 1

## 2019-03-02 MED FILL — Verapamil HCl IV Soln 2.5 MG/ML: INTRAVENOUS | Qty: 2 | Status: AC

## 2019-03-02 NOTE — Care Management Important Message (Signed)
Important Message  Patient Details  Name: ANASHA PERFECTO MRN: [id number] Date of Birth: 1950/08/14   Medicare Important Message Given:  Yes     [name] 03/02/2019, 11:24 AM

## 2019-03-02 NOTE — Progress Notes (Addendum)
CSW received consult for: "Wants to vote absentee and early - please facilitate"  Under [facility] law, no [employer] staff can witness or otherwise handle a patient's ballot. CSW has reached out to [employer] with [facility] to inquire if patient can be given a blank [employer].   CSW approved to provide patient with Elmer Picker, patient understands that family will need to pick up from hospital and take to [place] before Tuesday October 27 at [facility] [place]

## 2019-03-02 NOTE — Progress Notes (Signed)
Advanced Heart Failure Rounding Note  PCP-Cardiologist: No primary care provider on file.   Subjective:    Admitted with progressive SOB. Plan for RHC/LHC today.  Remains SOB with exeriton. On remain 9 liters oxygen.    RHC/LHC 03/07/2019  Prox LAD lesion is 30% stenosed.  Mid LAD lesion is 80% stenosed.   Findings:  Ao = 149/93 (115) LV = 150/9 RA = 5 RV = 39/3 PA = 27/6 (14) PCW = 13 Fick cardiac output/index = 3.5/2.0 PVR = < 1.0 WU FA sat = 100% PA sat = 71%, 71% High SVC sat = 71%  Assessment:  1. Significant 1v CAD with 80% LAD lesion 2. No significant PAH 3. RA pressure is low suggesting normal RV function 4. Moderately reduced CO 5. No clear evidence of intracardiac shunting  Objective:   Weight Range: 66 kg Body mass index is 23.48 kg/m.   Vital Signs:   Temp:  [97.4 F (36.3 C)-98 F (36.7 C)] 97.9 F (36.6 C) (10/23 1231) Pulse Rate:  [86-97] 88 (10/23 1231) Resp:  [18-22] 19 (10/23 1231) BP: (125-150)/(72-99) 125/86 (10/23 1231) SpO2:  [89 %-100 %] 97 % (10/23 1231) Weight:  [66 kg] 66 kg (10/23 0610) Last BM Date: 02/19/19(Per patient)  Weight change: Filed Weights   02/28/19 1846 03/07/2019 0459 03/02/19 0610  Weight: 68.9 kg 67 kg 66 kg    Intake/Output:   Intake/Output Summary (Last 24 hours) at 03/02/2019 1522 Last data filed at 03/02/2019 1336 Gross per 24 hour  Intake 1269 ml  Output 1450 ml  Net -181 ml      Physical Exam    General:  Frail  No resp difficulty HEENT: normal Neck: supple. no JVD. Carotids 2+ bilat; no bruits. No lymphadenopathy or thryomegaly appreciated. Cor: PMI nondisplaced. Regular rate & rhythm. No rubs, gallops or murmurs. Lungs: crackles in the bases. 9 liters HFNC  Abdomen: soft, nontender, nondistended. No hepatosplenomegaly. No bruits or masses. Good bowel sounds. Extremities: no cyanosis, clubbing, rash, edema Neuro: alert & orientedx3, cranial nerves grossly intact. moves all 4  extremities w/o difficulty. Affect pleasant   Telemetry  NSr 90 personally reviewed.    EKG    N/A   Labs    CBC Recent Labs    02/08/2019 0445  02/11/2019 1006 03/02/19 0509  WBC 16.7*  --   --  15.8*  NEUTROABS 11.0*  --   --  14.8*  HGB 16.7*   < > 15.6* 17.8*  HCT 50.6*   < > 46.0 53.1*  MCV 91.7  --   --  91.7  PLT 517*  --   --  424*   < > = values in this interval not displayed.   Basic Metabolic Panel Recent Labs    09/73/53 0445  02/22/2019 1006 03/02/19 0509  NA 134*   < > 134* 134*  K 3.6   < > 4.2 4.5  CL 97*  --   --  97*  CO2 24  --   --  23  GLUCOSE 103*  --   --  165*  BUN 18  --   --  17  CREATININE 0.59  --   --  0.46  CALCIUM 9.3  --   --  9.4   < > = values in this interval not displayed.   Liver Function Tests No results for input(s): AST, ALT, ALKPHOS, BILITOT, PROT, ALBUMIN in the last 72 hours. No results for input(s): LIPASE, AMYLASE in  the last 72 hours. Cardiac Enzymes No results for input(s): CKTOTAL, CKMB, CKMBINDEX, TROPONINI in the last 72 hours.  BNP: BNP (last 3 results) Recent Labs    02/13/2019 1203  BNP 1,175.2*    ProBNP (last 3 results) No results for input(s): PROBNP in the last 8760 hours.   D-Dimer No results for input(s): DDIMER in the last 72 hours. Hemoglobin A1C No results for input(s): HGBA1C in the last 72 hours. Fasting Lipid Panel No results for input(s): CHOL, HDL, LDLCALC, TRIG, CHOLHDL, LDLDIRECT in the last 72 hours. Thyroid Function Tests No results for input(s): TSH, T4TOTAL, T3FREE, THYROIDAB in the last 72 hours.  Invalid input(s): FREET3  Other results:   Imaging    Mr Cardiac Morphology W Wo Contrast  Result Date: 03/02/2019 CLINICAL DATA:  Dyspnea?  RV Function EXAM: CARDIAC MRI TECHNIQUE: The patient was scanned on a 1.5 Tesla Siemens magnet. A dedicated cardiac coil was used. Functional imaging was done using Fiesta sequences. 2,3, and 4 chamber views were done to assess for RWMA's.  Modified Simpson's rule using a short axis stack was used to calculate an ejection fraction on a dedicated work Conservation officer, nature. The patient received gadavist After 10 minutes inversion recovery sequences were used to assess for infiltration and scar tissue. CONTRAST:  Gadavist FINDINGS: Normal RA/LA. No ASD/PFO. Normal aortic root 3.5 cm. No pericardial effusion. Normal MV, AV and TV. Normal LV size and function with abnormal septal motion. Quantitative EF 60% (EDV 46 cc ESV 14 cc SV 32 cc) Delayed gadolinium images with no uptake in RV/LV or pericardium. No evidence of infarct or infiltration. RV is elongated with unusual morphology. Heavy trabeculations and prominent moderator band there is hypokinesis at the base and a dyskinetic segment at the apex. Moderate overall reduction in RVEF 39% (EDV 71 cc ESV 43 cc SV 27 cc) Note images provided the stroke volumes of both LV and RV appear quite low RV not dilated with basal dimension 32 mm but elongated at 75 mm with abnormal morphology described above IMPRESSION: 1.  Normal LV size and function small LV cavity EF 69% 2. RV elongated 75 mm with hypokinesis of the RV base and dyskinetic segment at apex RVEF 39% 3.  No delayed gadolinium uptake in RV/LV or pericardium 4.  Normal AV,MV, TV 5.  No pericardial effusion 6.  Normal aortic root 3.5 cm Jenkins Rouge Electronically Signed   By: Jenkins Rouge M.D.   On: 03/02/2019 12:25     Medications:     Scheduled Medications: . aspirin  81 mg Oral Daily  . atorvastatin  80 mg Oral q1800  . clopidogrel  75 mg Oral Q breakfast  . enoxaparin (LOVENOX) injection  40 mg Subcutaneous Q24H  . FLUoxetine  40 mg Oral Daily  . influenza vaccine adjuvanted  0.5 mL Intramuscular Tomorrow-1000  . ipratropium-albuterol  3 mL Nebulization TID  . mouth rinse  15 mL Mouth Rinse q12n4p  . methylPREDNISolone (SOLU-MEDROL) injection  125 mg Intravenous Q6H  . sodium chloride flush  3 mL Intravenous Q12H     Infusions: . sodium chloride      PRN Medications: sodium chloride, [DISCONTINUED] acetaminophen **OR** acetaminophen, acetaminophen, albuterol, calcium carbonate, hydrALAZINE, HYDROcodone-acetaminophen, ondansetron (ZOFRAN) IV, senna-docusate, sodium chloride, sodium chloride flush   Assessment/Plan    1. Acute on chronic cor pulmonale  - echo with evidence of PAH and severe RV dysfunction - Suspect significant pulmonary HTN in setting of advanced lung disease (WHO Group III) -  Preserved RV funciton.  - Continue aggressive pulmonary toilet and keep sats >= 88%. Sats on 9  liters HFNC stable.   2. Acute on chronic hypoxic respiratory failure - has underlying COPD with prolonged intubation several years ago but gut back to a very functional baseline - w/u now most c/w UIP and PH - CT negative for PE - Pulmonary managing - continue prednisone and nebs  3. Exertional chest tightness - suspect due to [facility] but has multiple CRFs. Will do coronary angio at time of RHC/LHC with 80% mid LAD , no significant PAH, and perserved RV funciton.   4. ID  Elevated WBC . On prednisone. Afebrile.   5. CAD NO chedst pain.   Prox LAD lesion is 30% stenosed.  Mid LAD lesion is 80% stenosed.  Add statin today.    Length of Stay: 12  Tonye Becket, NP  03/02/2019, 3:22 PM  Advanced Heart Failure Team Pager [phone] (M-F; 7a - 4p)  Please contact [facility] for night-coverage after hours (4p -7a ) and weekends on [email or web address]

## 2019-03-02 NOTE — Progress Notes (Signed)
Advanced Heart Failure Rounding Note  PCP-Cardiologist: No primary care provider on file.   Subjective:    Still SOB but getting better. Down to 8L O2.  Cath yesterday with no significant PAH. 80% mLAD lesion  cMRI today 1.  Normal LV size and function small LV cavity EF 69% 2. RV elongated 75 mm with hypokinesis of the RV base and dyskinetic segment at apex RVEF 39% with unusual morphology 3.  No delayed gadolinium uptake in RV/LV or pericardium 4.  Normal AV,MV, TV 5.  No pericardial effusion 6.  Normal aortic root 3.5 cm  Objective:   Weight Range: 66 kg Body mass index is 23.48 kg/m.   Vital Signs:   Temp:  [97.4 F (36.3 C)-98 F (36.7 C)] 97.9 F (36.6 C) (10/23 1231) Pulse Rate:  [86-97] 88 (10/23 1231) Resp:  [18-22] 19 (10/23 1231) BP: (125-150)/(72-99) 125/86 (10/23 1231) SpO2:  [89 %-100 %] 97 % (10/23 1231) Weight:  [66 kg] 66 kg (10/23 0610) Last BM Date: 02/19/19(Per patient)  Weight change: Filed Weights   02/28/19 1846 2019-02-01 0459 03/02/19 0610  Weight: 68.9 kg 67 kg 66 kg    Intake/Output:   Intake/Output Summary (Last 24 hours) at 03/02/2019 1745 Last data filed at 03/02/2019 1336 Gross per 24 hour  Intake 789 ml  Output 550 ml  Net 239 ml      Physical Exam    General:  Elderly frail No resp difficulty HEENT: normal Neck: supple. no JVD. Carotids 2+ bilat; no bruits. No lymphadenopathy or thryomegaly appreciated. Cor: PMI nondisplaced. Regular rate & rhythm. No rubs, gallops or murmurs. Lungs: + crackles Abdomen: soft, nontender, nondistended. No hepatosplenomegaly. No bruits or masses. Good bowel sounds. Extremities: no cyanosis, clubbing, rash, edema Neuro: alert & orientedx3, cranial nerves grossly intact. moves all 4 extremities w/o difficulty. Affect pleasant    Telemetry  NSR 80-90s personally reviewed.   EKG    N/A   Labs    CBC Recent Labs    2019-02-01 0445  2019-02-01 1006 03/02/19 0509  WBC 16.7*  --    --  15.8*  NEUTROABS 11.0*  --   --  14.8*  HGB 16.7*   < > 15.6* 17.8*  HCT 50.6*   < > 46.0 53.1*  MCV 91.7  --   --  91.7  PLT 517*  --   --  424*   < > = values in this interval not displayed.   Basic Metabolic Panel Recent Labs    46/96/292020-09-24 0445  2019-02-01 1006 03/02/19 0509  NA 134*   < > 134* 134*  K 3.6   < > 4.2 4.5  CL 97*  --   --  97*  CO2 24  --   --  23  GLUCOSE 103*  --   --  165*  BUN 18  --   --  17  CREATININE 0.59  --   --  0.46  CALCIUM 9.3  --   --  9.4   < > = values in this interval not displayed.   Liver Function Tests No results for input(s): AST, ALT, ALKPHOS, BILITOT, PROT, ALBUMIN in the last 72 hours. No results for input(s): LIPASE, AMYLASE in the last 72 hours. Cardiac Enzymes No results for input(s): CKTOTAL, CKMB, CKMBINDEX, TROPONINI in the last 72 hours.  BNP: BNP (last 3 results) Recent Labs    02/16/2019 1203  BNP 1,175.2*    ProBNP (last 3 results) No results  for input(s): PROBNP in the last 8760 hours.   D-Dimer No results for input(s): DDIMER in the last 72 hours. Hemoglobin A1C No results for input(s): HGBA1C in the last 72 hours. Fasting Lipid Panel No results for input(s): CHOL, HDL, LDLCALC, TRIG, CHOLHDL, LDLDIRECT in the last 72 hours. Thyroid Function Tests No results for input(s): TSH, T4TOTAL, T3FREE, THYROIDAB in the last 72 hours.  Invalid input(s): FREET3  Other results:   Imaging    Mr Cardiac Morphology W Wo Contrast  Result Date: 03/02/2019 CLINICAL DATA:  Dyspnea?  RV Function EXAM: CARDIAC MRI TECHNIQUE: The patient was scanned on a 1.5 Tesla Siemens magnet. A dedicated cardiac coil was used. Functional imaging was done using Fiesta sequences. 2,3, and 4 chamber views were done to assess for RWMA's. Modified Simpson's rule using a short axis stack was used to calculate an ejection fraction on a dedicated work Conservation officer, nature. The patient received gadavist After 10 minutes inversion  recovery sequences were used to assess for infiltration and scar tissue. CONTRAST:  Gadavist FINDINGS: Normal RA/LA. No ASD/PFO. Normal aortic root 3.5 cm. No pericardial effusion. Normal MV, AV and TV. Normal LV size and function with abnormal septal motion. Quantitative EF 60% (EDV 46 cc ESV 14 cc SV 32 cc) Delayed gadolinium images with no uptake in RV/LV or pericardium. No evidence of infarct or infiltration. RV is elongated with unusual morphology. Heavy trabeculations and prominent moderator band there is hypokinesis at the base and a dyskinetic segment at the apex. Moderate overall reduction in RVEF 39% (EDV 71 cc ESV 43 cc SV 27 cc) Note images provided the stroke volumes of both LV and RV appear quite low RV not dilated with basal dimension 32 mm but elongated at 75 mm with abnormal morphology described above IMPRESSION: 1.  Normal LV size and function small LV cavity EF 69% 2. RV elongated 75 mm with hypokinesis of the RV base and dyskinetic segment at apex RVEF 39% 3.  No delayed gadolinium uptake in RV/LV or pericardium 4.  Normal AV,MV, TV 5.  No pericardial effusion 6.  Normal aortic root 3.5 cm Jenkins Rouge Electronically Signed   By: Jenkins Rouge M.D.   On: 03/02/2019 12:25     Medications:     Scheduled Medications: . aspirin  81 mg Oral Daily  . atorvastatin  80 mg Oral q1800  . clopidogrel  75 mg Oral Q breakfast  . enoxaparin (LOVENOX) injection  40 mg Subcutaneous Q24H  . FLUoxetine  40 mg Oral Daily  . influenza vaccine adjuvanted  0.5 mL Intramuscular Tomorrow-1000  . ipratropium-albuterol  3 mL Nebulization TID  . mouth rinse  15 mL Mouth Rinse q12n4p  . methylPREDNISolone (SOLU-MEDROL) injection  125 mg Intravenous Q6H  . sodium chloride flush  3 mL Intravenous Q12H    Infusions: . sodium chloride      PRN Medications: sodium chloride, [DISCONTINUED] acetaminophen **OR** acetaminophen, acetaminophen, albuterol, calcium carbonate, hydrALAZINE,  HYDROcodone-acetaminophen, ondansetron (ZOFRAN) IV, senna-docusate, sodium chloride, sodium chloride flush   Assessment/Plan    1 Acute on chronic hypoxic respiratory failure - has underlying COPD with prolonged intubation several years ago but gut back to a very functional baseline - w/u now most c/w UIP and PH - CT negative for PE - Pulmonary managing - continue prednisone and nebs  2. Abnormal RV - RHC without evidence of significant PAH - cMRI with mildly decreased RV function and unusual RV morphology of un certain etiology. Will review with  imaging team   3. CAD with Exertional chest tightness - cath with 80% LAD.  - plan PCI on Monday. She is on DAPT, statin. Will NOT be able to stop DAPT for 6 months after stent placement so if she is a candidate for lung biopsy need to do prior to PCI  I will see again on Monday.  Please call with questions.   Length of Stay: 12  Arvilla Meres, MD  03/02/2019, 5:45 PM  Advanced Heart Failure Team Pager [phone] (M-F; 7a - 4p)  Please contact [facility] for night-coverage after hours (4p -7a ) and weekends on [email or web address]

## 2019-03-02 NOTE — Progress Notes (Signed)
Subjective:    Julia Massey reports that she is doing well today. She is excited to eat a big breakfast this morning. She states the procedure went well yesterday and she did not have any issues. She is aware of needing a stent and is in agreement to try and have it done while here. She noticed that she was placed on higher oxygen after the procedure but is not feeling SOB at this time. She is also aware that the prognosis with IPF is not great but has not made any decisions regarding goals of care yet.   Objective:  Vital signs in last 24 hours: Vitals:   02/24/2019 2049 03/09/2019 2149 03/02/19 0406 03/02/19 0610  BP:  (!) 130/92 (!) 148/94   Pulse: 86 89 89   Resp: (!) 22 20 20    Temp:  97.6 F (36.4 C) 98 F (36.7 C)   TempSrc:   Oral   SpO2: 96% 100% 91%   Weight:    66 kg  Height:        Physical Exam Vitals signs and nursing note reviewed.  Constitutional:      General: She is not in acute distress.    Appearance: She is well-developed. She is not toxic-appearing.  Cardiovascular:     Rate and Rhythm: Normal rate and regular rhythm.     Heart sounds: No murmur.  Pulmonary:     Effort: No tachypnea, accessory muscle usage or respiratory distress.     Breath sounds: Rales (bilateral in all lung fields, more so in the lower lobes though. Dry in nature, unchanged from previous examination) present. No decreased breath sounds, wheezing or rhonchi.  Abdominal:     General: Bowel sounds are normal.     Palpations: Abdomen is soft.  Skin:    General: Skin is warm and dry.     Findings: No ecchymosis or rash.  Neurological:     General: No focal deficit present.     Mental Status: She is alert and oriented to person, place, and time.  Psychiatric:        Mood and Affect: Mood normal.        Behavior: Behavior normal.    Assessment/Plan:  Active Problems:   DNR (do not resuscitate) discussion   Acute respiratory failure with hypoxemia (HCC)   Palliative care by  specialist   Weakness generalized  # Acute hypoxic respiratory failure  CTA on 10/12 was notable for bibasilar honeycombing, diffuse ground glass opacities, enlarged right ventricle and pulmonary arteries consistent with pulmonary artery hypertension and chronic changes consistent with interstitial lung disease. PCCM was consulted and noted possible ILD. Rheumatologic workup notable for elevated sed rate and positive RF, rest of serology were negative including CCP. High-resolution CT on 10/19 showed significant ground glass opacities and findings consistent with UIP with possible overlying edema vs UIP flare. 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 Despite the lack of mortality benefit and possible harm, patient will require steroids to maintain her pulmonary function. Due to lack of adequate improvement with Prednisone, she has been switched to to high dose Solumedrol.  Patient was able to be weaned back down to 11L this morning without difficulty. Appears she was increased to 15L after cath yesterday. Will continue weaning her as tolerated and follow up PCCM recommendations.  - PCCM on board and we appreciate their recommendations - Telemetry - HFNC, maintain saturation above >80% - Wean as tolerated - Duoneb TID  - Solumedrol 125mg  QID  #  Pulmonary Hypertension # Right Heart Failure  Last echo in 2017 showed grade 1 diastolic dysfunction with preserved EF at 55-60%. BNP on this admission was 1170. TTE on 10/12 showed right ventricle has severely reduced systolic function with moderate pulmonary hypertension. Repeat TTE (as recommended by PCCM when patient more stable) today showed continued right heart failure but not as severe as prior. Heart cath on 10/23 did not show significant pulmonary htn or right heart strain. Cards has recommended a cardiac MRI for further evaluation.   - Telemetry - Strict I/Os  - Daily weights - Cardiac MRI per cards   # LAD Stenosis:   Coronary angio on 10/22 showed a 80% stenosis of the LAD. Cardiology recommends PCI but preferred to wait until improvement in respiratory status. However given that this is unlikely to be in the near future, Dr. Chase Caller has okayed moving forward with PCI if she can tolerate lying flat. I conveyed this to the PA working with Dr. Haroldine Laws yesterday.   - Follow up with Cardiology regarding stenting. We appreciate their recommendations.   #Elevated BP:  BP has begun to rise again now that her steroid dose was increased. Averaging systolic is approximately 140-150s. No intervention necessary at this time. Continue to monitor.   # Secondary Polycythemia  Likely secondary to chronic hypoxia with hx of chronic lung disease. Hemoglobin stable around 16-17. Will continue to monitor.    # Chronic Back Pain:  Restarted home Norco.   - Norco 5-325mg , 1-2 tablets q6h PRN  # Hx of Depression: Continue Prozac   Dispo: Anticipated discharge pending clinical improvement.   Dr. Jose Persia Internal Medicine PGY-1  Pager: [phone] 03/02/2019, 6:55 AM

## 2019-03-02 NOTE — Progress Notes (Signed)
Internal Medicine Attending:   I saw and examined the patient. I reviewed the resident's note and I agree with the resident's findings and plan as documented in the resident's note.  Patient states that her shortness of breath is a little better today.  Patient is initially made to the hospital with acute hypoxic respiratory failure and was found to have likely ILD (possibly IPF).  Patient still requiring significant O2 supplementation (was on 15 L high flow nasal cannula when we evaluate her and we have gradually titrated this down to 9 L nasal cannula throughout the day).  Pulmonary follow-up recommendations appreciated.  We will continue with Solu-Medrol for now per pulmonary recommendations.  Likely transition to oral steroids on Monday.  We discussed with the patient the poor prognosis this morning on rounds for this disease.  Patient expressed understanding.  She did have a discussion with palliative care yesterday.  She is full code at this time but is thinking about her options.  We will continue to have an ongoing discussion with her about her goals of care.  Patient noted to have LAD stenosis of 80% on coronary catheterization.  Cardiology recommending PCI with stent placement but will wait until her respiratory status improves.  Right heart cath did not show significant pulmonary hypertension or right heart strain.  Cardiac MRI done today showed elongated right RV with hypokinesis of the RV base and dyskinesis at the apex.  Cardiology to follow-up.  No further work-up at this time.

## 2019-03-02 NOTE — Progress Notes (Signed)
NAME:  Julia Massey, MRN:  [id number], DOB:  Mar 30, 1951, LOS: 12 ADMISSION DATE:  03/02/2019, CONSULTATION DATE:  02/19/19 REFERRING MD:  Daron Offer IMTS, CHIEF COMPLAINT:  Acute on chronic respiratory failure  Brief History   68 yo F Hx COPD  Respiratory failure  History of present illness   68 yo F PMH COPD, chronic pain grade I dialstolic dysfunction who presented to ED 10/11 with CC SOB. Myalgias, malaise, nightsweats began 2 days prior to presentation. Patient began taking leftover levaquin from prior infection x 2 doses. Additionally she took [employer], as well as home albuterol. On Sunday, patient with significantly worse SOB. Denied fever, cough, sore throat, rhinitis, HA, n/v/d, dizziness, changes in appetite.   Patient presents with leukocytosos 18.8. In ED she was given duonebs ans started on abx + steroids. COVID-19 negative. Patient placed on NRB with SpO2 92% Presenting ABG: 7.446/ 26/55/18/19/91   Admitted to IMTS and started on BiPAP. PCCM asked to see 10/12.  Past Medical History  COPD Carpal Tunnel Chronic back pain Gout HLD Diastolic heart dysfunction   Results for ILSE, BILLMAN" (MRN [id number]) as of 02/28/2019 16:47  Ref. Range 05/15/2015 14:25 05/15/2015 16:46 02/19/2019 14:19 02/27/2019 03:03  Anti Nuclear Antibody (ANA) Latest Ref Range: Negative    Negative   ANA Ab, IFA Unknown Negative     ANCA Proteinase 3 Latest Ref Range: 0.0 - 3.5 U/mL <3.5     Anti JO-1 Latest Ref Range: 0.0 - 0.9 AI   <0.2   CCP Antibodies IgG/IgA Latest Ref Range: 0 - 19 units   6 4  Myeloperoxidase Abs Latest Ref Range: 0.0 - 9.0 U/mL <9.0     RA Latex Turbid. Latest Ref Range: 0.0 - 13.9 IU/mL  14.1 (H) 36.6 (H)   Cytoplasmic (C-ANCA) Latest Ref Range: Neg:<1:20 titer   <1:20   P-ANCA Latest Ref Range: Neg:<1:20 titer   <1:20   Atypical P-ANCA titer Latest Ref Range: Neg:<1:20 titer   <1:20   C3 Complement Latest Ref Range: 82 - 167 mg/dL  427    Complement C4,  Body Fluid Latest Ref Range: 14 - 44 mg/dL  30    SSA (Ro) (ENA) Antibody, IgG Latest Ref Range: 0.0 - 0.9 AI   <0.2   SSB (La) (ENA) Antibody, IgG Latest Ref Range: 0.0 - 0.9 AI   <0.2     Significant Hospital Events   10/11 admitted 10/12 PCCM consulted.  10/21 - says not better. On 10LNC at rest and as she talks pulse ox is 88%. Says pre-admit was not on o2 but doing well. Says brother in [place] who is 6 years older Brooke Bonito) also diagnosed with pulmonary fibrosis in feb 2020. She wants me to call him. Chart review shows RV strain + but she does not have dema.  Chart review shows essentially negative serology  (RF is < 2x ULN). HRCT shows UIP wiuth GGO - pretty c/w UIP flare  10/22  - relatively normal RHC and no rv failure. 80% LAD stenosis. Start solumedrol 125mg  IV q6h       Consults:  PCCM  Procedures:   Significant Diagnostic Tests:  10/11 CXR> Chronic interstitial lung disease with new R sided opacities.  10/12 ECHO >>> 10/12 CT angio chest> No PE. enlarged heart, no pericardial effusion. Diffuse ground-glass appearance throughout lungs. No Pleural Effusion. Emphysematous changes.  10/13 ECHO> 1. Left ventricular ejection fraction, by visual estimation, is 55 to 60%. The left ventricle  has normal function. Normal left ventricular size. There is no left ventricular hypertrophy.  2. Left ventricular diastolic Doppler parameters are consistent with impaired relaxation pattern of LV diastolic filling.  3. Global right ventricle has severely reduced systolic function.The right ventricular size is mildly enlarged.  4. Left atrial size was mildly dilated.  5. Right atrial size was normal.  6. The mitral valve is normal in structure. Trace mitral valve regurgitation. No evidence of mitral stenosis.  7. The tricuspid valve is normal in structure. Tricuspid valve regurgitation is mild.  8. The aortic valve is tricuspid Aortic valve regurgitation was not visualized by color flow  Doppler. Structurally normal aortic valve, with no evidence of sclerosis or stenosis.  9. The pulmonic valve was not well visualized. Pulmonic valve regurgitation is not visualized by color flow Doppler. 10. Moderately elevated pulmonary artery systolic pressure. 11. The inferior vena cava is dilated in size with >50% respiratory variability, suggesting right atrial pressure of 8 mmHg. 12. Normal LV systolic function; grade 1 diastolic dysfunction; mild LAE; mild RVE with severe RV dysfunction; cannot R/O thrombus in RV on apical images; suggest CTA to further assess; mild TR; moderate pulmonary hypertension.  10/13 CXR> Chronic diffuse bilateral interstitial prominence, with some interval improvement-- likely improved pulmonary edema \  Echo 10/19:  Left Ventricle: Left ventricular ejection fraction, by visual estimation, is 50 to 55%. The left ventricle has mildly decreased function. There is no left ventricular hypertrophy. Normal left ventricular size. Spectral Doppler shows Left ventricular  diastolic Doppler parameters are consistent with impaired relaxation pattern of LV diastolic filling. Markedly abnormal septal motion. Right Ventricle: The right ventricular size is mildly enlarged. No increase in right ventricular wall thickness. Global RV systolic function is has mildly reduced systolic function.    Micro Data:  10/11 SARS CoV2> neg  10/12 RVP neg  Antimicrobials:  Azithroymycin 10/11-10/13 Rocephin 10/11-10/13  Interim history/subjective:    03/02/2019  - s/p cardiac MRI. Results pending. Cards prefers to see course with steroids and results of cardiac MRI before deciding on stent. STarted higher dose IV steroids yesterday. Today - feels same. Down to 9L [place] - with o2 in only 1 nostril pulse ox is 90%. 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 Asking for social work help to vote early  Objective   Blood pressure 128/89, pulse 96, temperature (!) 97.4 F (36.3 C),  temperature source Oral, resp. rate 18, height 5\' 6"  (1.676 m), weight 66 kg, SpO2 99 %.        Intake/Output Summary (Last 24 hours) at 03/02/2019 1227 Last data filed at 03/02/2019 0900 Gross per 24 hour  Intake 906 ml  Output 1450 ml  Net -544 ml   Filed Weights   02/28/19 1846 Dec 14, 2018 0459 03/02/19 0610  Weight: 68.9 kg 67 kg 66 kg    General Appearance:  Looks stable but desatruates as she talks Head:  Normocephalic, without obvious abnormality, atraumatic Eyes:  PERRL - yes, conjunctiva/corneas - clear     Ears:  Normal external ear canals, both ears Nose:  G tube - [place] 10L Throat:  ETT TUBE - no , OG tube - no Neck:  Supple,  No enlargement/tenderness/nodules Lungs:  No distress. Crackles Heart:  S1 and S2 normal, no murmur, CVP - no.  Pressors - no Abdomen:  Soft, no masses, no organomegaly Genitalia / Rectal:  Not done Extremities:  Extremities- intact , dry Skin:  ntact in exposed areas . Sacral area -  not examined Neurologic:  Sedation - none -> RASS - +1 . Moves all 4s - yes. CAM-ICU - neg . Orientation - x3+      LABS    PULMONARY Recent Labs  Lab 02/28/2019 0956 02/16/2019 1001 02/21/2019 1006  PHART  --  7.452*  --   PCO2ART  --  37.3  --   PO2ART  --  270.0*  --   HCO3 25.3  29.0* 26.1 27.6  TCO2 26  30 27 29   O2SAT 70.0  70.0 100.0 70.0    CBC Recent Labs  Lab 02/28/19 0452 03/02/2019 0445  02/26/2019 1001 02/10/2019 1006 03/02/19 0509  HGB 17.4* 16.7*   < > 17.0* 15.6* 17.8*  HCT 51.5* 50.6*   < > 50.0* 46.0 53.1*  WBC 16.4* 16.7*  --   --   --  15.8*  PLT 486* 517*  --   --   --  424*   < > = values in this interval not displayed.    COAGULATION No results for input(s): INR in the last 168 hours.  CARDIAC  No results for input(s): TROPONINI in the last 168 hours. No results for input(s): PROBNP in the last 168 hours.   CHEMISTRY Recent Labs  Lab 02/26/19 0529 02/27/19 0303 02/28/19 0452 02/09/2019 0445 03/08/2019 0956 02/13/2019  1001 02/28/2019 1006 03/02/19 0509  NA 134* 132* 132* 134* 138  134* 132* 134* 134*  K 4.2 4.3 3.8 3.6 3.8  4.6 4.6 4.2 4.5  CL 97* 92* 94* 97*  --   --   --  97*  CO2 24 26 25 24   --   --   --  23  GLUCOSE 111* 113* 109* 103*  --   --   --  165*  BUN 15 22 20 18   --   --   --  17  CREATININE 0.54 0.59 0.55 0.59  --   --   --  0.46  CALCIUM 9.3 10.1 9.4 9.3  --   --   --  9.4   Estimated Creatinine Clearance: 64.8 mL/min (by C-G formula based on SCr of 0.46 mg/dL).   LIVER No results for input(s): AST, ALT, ALKPHOS, BILITOT, PROT, ALBUMIN, INR in the last 168 hours.   INFECTIOUS Recent Labs  Lab 02/23/2019 0445 03/02/19 0705  PROCALCITON <0.10 <0.10     ENDOCRINE CBG (last 3)  Recent Labs    02/19/2019 0738 02/15/2019 1806 03/02/19 0103  GLUCAP 116* 226* 148*      CLINICAL DATA:  Inpatient.  Evaluate for interstitial lung disease.  EXAM: CT CHEST WITHOUT CONTRAST personally visualzied this cT and agree  TECHNIQUE: Multidetector CT imaging of the chest was performed following the standard protocol without intravenous contrast. High resolution imaging of the lungs, as well as inspiratory and expiratory imaging, was performed.  COMPARISON:  02/19/2019 chest CT angiogram. 02/20/2019 chest radiograph.  FINDINGS: Cardiovascular: Mild cardiomegaly, stable. No significant pericardial effusion/thickening. Left anterior descending coronary atherosclerosis. Atherosclerotic nonaneurysmal thoracic aorta. Stable dilated main pulmonary artery (3.6 cm diameter).  Mediastinum/Nodes: No discrete thyroid nodules. Unremarkable esophagus. No axillary adenopathy. Enlarged 1.3 cm right paratracheal node (series 5/image 63), stable from 02/19/2019 CT. No new pathologically enlarged mediastinal nodes. No discrete hilar adenopathy on this noncontrast scan.  Lungs/Pleura: No pneumothorax. No pleural effusion. No acute consolidative airspace disease or lung masses. Apical left  upper lobe 5 mm solid pulmonary nodule (series 6/image 27), stable since 02/19/2019, obscured by streak artifact on 05/15/2015 CT.  No additional significant pulmonary nodules. There is extensive patchy confluent ground-glass opacity and reticulation throughout the peripheral lungs bilaterally with associated marked traction bronchiectasis, volume loss and distortion. These findings have a strong basilar predominance. There is moderate honeycombing at both lung bases. Findings have substantially progressed since 05/15/2015 chest CT. No significant lobular air trapping or evidence of tracheobronchomalacia on the expiration sequence.  Upper abdomen: Small hiatal hernia.  Musculoskeletal: No aggressive appearing focal osseous lesions. Moderate thoracic spondylosis.  IMPRESSION: 1. Spectrum of findings compatible with basilar predominant fibrotic interstitial lung disease with moderate honeycombing. 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 Significant ground-glass component may represent superimposed pulmonary edema or UIP flare. Findings are consistent with UIP per consensus guidelines: Diagnosis of Idiopathic Pulmonary Fibrosis: An Official ATS/ERS/JRS/ALAT Clinical Practice Guideline. Am Rosezetta SchlatterJ Respir Crit Care Med Vol 198, Iss 5, [phone], Jan 08 2017. 2. Cardiomegaly.  One vessel coronary atherosclerosis. 3. Dilated main pulmonary artery, suggesting pulmonary arterial hypertension. 4. Nonspecific mild mediastinal lymphadenopathy, most likely reactive. 5. Solitary 5 mm solid apical left upper lobe pulmonary nodule. No follow-up needed if patient is low-risk. Non-contrast chest CT can be considered in 12 months if patient is high-risk. This recommendation follows the consensus statement: Guidelines for Management of Incidental Pulmonary Nodules Detected on CT Images:From the Fleischner Society 2017; published online before print (10.1148/[id number]).  6. Small hiatal hernia.  Aortic Atherosclerosis (ICD10-I70.0).   Electronically Signed   By: Delbert PhenixJason A Poff M.D.   On: 02/26/2019 10:33  Swallow eval 2017 IMPRESSION: 1. Moderately degraded exam, as detailed above. 2. Penetration and trace aspiration with single swallows. Please see speech pathology evaluation. 3. Esophageal dysmotility, likely presbyesophagus. 4. No high-grade stricture or other anatomic cause for patient's symptoms within the thoracic esophagus.   Electronically Signed   By: Jeronimo GreavesKyle  Talbot M.D.   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 On 9L [place]. On solumedrol 125mg  IV q6h since 02/21/2019   Plan  -Rx as IPF flare = hopefully she improves. Explained that if IPF flare long term prognosis is poor - she is at peace with this  - o2 - solumedrol to continue - total steroid course 3 weeks. Aim to go po 03/05/2019 - await cardiac MRI results -- goals of care discussion appropriate - recommend full medical care but no CPR If declines - IMTS to sort out  Ccm will follow at least once over weeked      SIGNATURE    Dr. Kalman ShanMurali Dimitriy Carreras, M.D., F.C.C.P,  Pulmonary and Critical Care Medicine Staff Physician, [facility] Director - Interstitial Lung Disease  Program  Pulmonary Fibrosis [facility] [place], [place], [id number]  Pager: [phone], If no answer or between  15:00h - 7:00h: call [phone] Telephone: [phone]  12:27 PM 03/02/2019

## 2019-03-03 DIAGNOSIS — I50810 Right heart failure, unspecified: Secondary | ICD-10-CM

## 2019-03-03 DIAGNOSIS — R69 Illness, unspecified: Secondary | ICD-10-CM | POA: Diagnosis not present

## 2019-03-03 DIAGNOSIS — Z9861 Coronary angioplasty status: Secondary | ICD-10-CM

## 2019-03-03 DIAGNOSIS — M549 Dorsalgia, unspecified: Secondary | ICD-10-CM | POA: Diagnosis not present

## 2019-03-03 DIAGNOSIS — I110 Hypertensive heart disease with heart failure: Secondary | ICD-10-CM | POA: Diagnosis not present

## 2019-03-03 DIAGNOSIS — J9601 Acute respiratory failure with hypoxia: Secondary | ICD-10-CM | POA: Diagnosis not present

## 2019-03-03 DIAGNOSIS — D751 Secondary polycythemia: Secondary | ICD-10-CM | POA: Diagnosis not present

## 2019-03-03 DIAGNOSIS — G8929 Other chronic pain: Secondary | ICD-10-CM | POA: Diagnosis not present

## 2019-03-03 DIAGNOSIS — I2510 Atherosclerotic heart disease of native coronary artery without angina pectoris: Secondary | ICD-10-CM | POA: Diagnosis not present

## 2019-03-03 DIAGNOSIS — I272 Pulmonary hypertension, unspecified: Secondary | ICD-10-CM | POA: Diagnosis not present

## 2019-03-03 LAB — CBC WITH DIFFERENTIAL/PLATELET
Abs Immature Granulocytes: 0.23 10*3/uL — ABNORMAL HIGH (ref 0.00–0.07)
Basophils Absolute: 0 10*3/uL (ref 0.0–0.1)
Basophils Relative: 0 %
Eosinophils Absolute: 0 10*3/uL (ref 0.0–0.5)
Eosinophils Relative: 0 %
HCT: 46.7 % — ABNORMAL HIGH (ref 36.0–46.0)
Hemoglobin: 15.6 g/dL — ABNORMAL HIGH (ref 12.0–15.0)
Immature Granulocytes: 1 %
Lymphocytes Relative: 3 %
Lymphs Abs: 0.7 10*3/uL (ref 0.7–4.0)
MCH: 30.5 pg (ref 26.0–34.0)
MCHC: 33.4 g/dL (ref 30.0–36.0)
MCV: 91.2 fL (ref 80.0–100.0)
Monocytes Absolute: 0.7 10*3/uL (ref 0.1–1.0)
Monocytes Relative: 3 %
Neutro Abs: 19.4 10*3/uL — ABNORMAL HIGH (ref 1.7–7.7)
Neutrophils Relative %: 93 %
Platelets: 528 10*3/uL — ABNORMAL HIGH (ref 150–400)
RBC: 5.12 MIL/uL — ABNORMAL HIGH (ref 3.87–5.11)
RDW: 12.1 % (ref 11.5–15.5)
WBC: 21 10*3/uL — ABNORMAL HIGH (ref 4.0–10.5)
nRBC: 0 % (ref 0.0–0.2)

## 2019-03-03 LAB — BASIC METABOLIC PANEL
Anion gap: 13 (ref 5–15)
BUN: 20 mg/dL (ref 8–23)
CO2: 22 mmol/L (ref 22–32)
Calcium: 9.5 mg/dL (ref 8.9–10.3)
Chloride: 99 mmol/L (ref 98–111)
Creatinine, Ser: 0.64 mg/dL (ref 0.44–1.00)
GFR calc Af Amer: >60 mL/min (ref >60)
GFR calc non Af Amer: >60 mL/min (ref >60)
Glucose, Bld: 139 mg/dL — ABNORMAL HIGH (ref 70–99)
Potassium: 4.5 mmol/L (ref 3.5–5.1)
Sodium: 134 mmol/L — ABNORMAL LOW (ref 135–145)

## 2019-03-03 LAB — GLUCOSE, CAPILLARY
Glucose-Capillary: 221 mg/dL — ABNORMAL HIGH (ref 70–99)
Glucose-Capillary: 175 mg/dL — ABNORMAL HIGH (ref 70–99)

## 2019-03-03 NOTE — Progress Notes (Signed)
Subjective:    Julia Massey says she is doing well this morning. She did not eat all of her breakfast yet but did enjoy some cereal. Her breathing is continuing to be stable although she did have some difficulty with the cMRI yesterday as it was 2 hours long and required some breath holding. She recalls that she woke up with the [place] out of her nose, and it was not causing her to feel SOB at at time. She endorses some chest tightness that developed after but feels it is likely from having to lay flat, as her back hurts as well. She denies any pain around the right groin where they did the cath.   She is updating her family today about how she is doing. She is eager to get better. She is looking forward to having the stent on Monday.   Objective:  Vital signs in last 24 hours: Vitals:   03/02/19 2020 03/02/19 2025 03/03/19 0407 03/03/19 0432  BP: 131/84  (!) 141/94   Pulse: 94  77   Resp: 20  (!) 22   Temp: 97.9 F (36.6 C)  97.6 F (36.4 C)   TempSrc: Oral  Oral   SpO2: 93% 93% 94%   Weight:    65.8 kg  Height:        Physical Exam Vitals signs and nursing note reviewed.  Constitutional:      General: She is not in acute distress.    Appearance: She is well-developed. She is not toxic-appearing.  Cardiovascular:     Rate and Rhythm: Normal rate and regular rhythm.     Heart sounds: No murmur.  Pulmonary:     Effort: No tachypnea, accessory muscle usage or respiratory distress.     Breath sounds: Rales (bilateral in all lung fields, more so in the lower lobes though. Dry in nature, unchanged from previous examination) present. No decreased breath sounds, wheezing or rhonchi.  Abdominal:     General: Bowel sounds are normal.     Palpations: Abdomen is soft.  Skin:    General: Skin is warm and dry.     Findings: No ecchymosis or rash.     Comments: No bruising or hematoma noted around right groin or bilateral flanks  Neurological:     General: No focal deficit present.   Mental Status: She is alert and oriented to person, place, and time.  Psychiatric:        Mood and Affect: Mood normal.        Behavior: Behavior normal.    Assessment/Plan:  Active Problems:   DNR (do not resuscitate) discussion   Acute respiratory failure with hypoxemia ([place])   Palliative care by specialist   Weakness generalized  # Acute hypoxic respiratory failure  CTA on 10/12 was notable for bibasilar honeycombing, diffuse ground glass opacities, enlarged right ventricle and pulmonary arteries consistent with pulmonary artery hypertension and chronic changes consistent with interstitial lung disease. PCCM was consulted and noted possible ILD. Rheumatologic workup notable for elevated sed rate and positive RF, rest of serology were negative including CCP. High-resolution CT on 10/19 showed significant ground glass opacities and findings consistent with UIP with possible overlying edema vs UIP flare. 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 Despite the lack of mortality benefit and possible harm, patient will require steroids to maintain her pulmonary function. Due to lack of adequate improvement with Prednisone, she has been switched to to high dose Solumedrol.  Improving oxygen requirement, down to  9L now. Will continue to monitor as she receives high dose steroids.   - PCCM on board and we appreciate their recommendations - Telemetry - HFNC, maintain saturation above >80% - Wean as tolerated - Duoneb TID  - Solumedrol 125mg  QID  # Pulmonary Hypertension # Right Heart Failure  Last echo in 2017 showed grade 1 diastolic dysfunction with preserved EF at 55-60%. BNP on this admission was 1170. TTE on 10/12 showed right ventricle has severely reduced systolic function with moderate pulmonary hypertension. Repeat TTE on 10/19 showed continued right heart failure but not as severe as prior. Heart cath on 10/22 did not show significant pulmonary htn or right heart  strain.   cMRI yesterday showed some hypokinesis at the RV base with dyskinesis at RV apex. Dr. 11/22 stated in his note he will follow up these results with the imaging team as it is unusual morphology. No additional intervention required at this time, will continue to monitor.  Hemoglobin dropped to 15.56 today after being stable around 17.8 yesterday. Concerned for bleeding complication after cath on 10/22, however physical examination is negative. Will monitor closely.   - HF is on board and we appreciate their recommendations  - Telemetry - Strict I/Os  - Daily weights - CBC  # LAD Stenosis:  Coronary angio on 10/22 showed a 80% stenosis of the LAD. Cardiology recommends PCI but preferred to wait until improvement in respiratory status. However given that this is unlikely to be in the near future, Dr. 11/22 has okayed moving forward with PCI if she can tolerate lying flat. It is currently scheduled for 10/26 per Dr. 11/26 note.   - PCI on 10/26  #Elevated BP:  BP has begun to rise again now that her steroid dose was increased. Averaging systolic continues to be around 140-150s. No intervention necessary at this time. Continue to monitor.   # Secondary Polycythemia  Likely secondary to chronic hypoxia with hx of chronic lung disease.     # Chronic Back Pain:  Continue home Norco.   - Norco 5-325mg , 1-2 tablets q6h PRN  # Hx of Depression: Continue Prozac   Dispo: Anticipated discharge pending clinical improvement.   Dr. 11/26 Internal Medicine PGY-1  Pager: [phone] 03/03/2019, 6:51 AM

## 2019-03-03 NOTE — Progress Notes (Signed)
Internal Medicine Attending:   I saw and examined the patient. I reviewed the Dr Noni Saupe note and I agree with the resident's findings and plan as documented in the resident's note. Remains mildly tachypenic. O2 sat stable. Continue solumedrol per pulmonology.  Plan for PCI to LAD by Cards on 10/26.  Leukocytosis likely due to steroids.  Hgb overall stable, no groin hematoma or back pain post cath.

## 2019-03-03 NOTE — Progress Notes (Signed)
Patient stable  CCM can round tomorrow - >      SIGNATURE    Dr. Brand Males, M.D., F.C.C.P,  Pulmonary and Critical Care Medicine Staff Physician, [place] Director - Interstitial Lung Disease  Program  Pulmonary [facility] at [place], [place], [address]  Pager: [phone], If no answer or between  15:00h - 7:00h: call [phone] Telephone: [phone]  4:57 PM 03/03/2019

## 2019-03-04 DIAGNOSIS — J9601 Acute respiratory failure with hypoxia: Secondary | ICD-10-CM | POA: Diagnosis not present

## 2019-03-04 LAB — CBC WITH DIFFERENTIAL/PLATELET
Abs Immature Granulocytes: 0.16 10*3/uL — ABNORMAL HIGH (ref 0.00–0.07)
Basophils Absolute: 0 10*3/uL (ref 0.0–0.1)
Basophils Relative: 0 %
Eosinophils Absolute: 0 10*3/uL (ref 0.0–0.5)
Eosinophils Relative: 0 %
HCT: 44.4 % (ref 36.0–46.0)
Hemoglobin: 15 g/dL (ref 12.0–15.0)
Immature Granulocytes: 1 %
Lymphocytes Relative: 3 %
Lymphs Abs: 0.5 10*3/uL — ABNORMAL LOW (ref 0.7–4.0)
MCH: 31.1 pg (ref 26.0–34.0)
MCHC: 33.8 g/dL (ref 30.0–36.0)
MCV: 92.1 fL (ref 80.0–100.0)
Monocytes Absolute: 0.5 10*3/uL (ref 0.1–1.0)
Monocytes Relative: 3 %
Neutro Abs: 16.5 10*3/uL — ABNORMAL HIGH (ref 1.7–7.7)
Neutrophils Relative %: 93 %
Platelets: 481 10*3/uL — ABNORMAL HIGH (ref 150–400)
RBC: 4.82 MIL/uL (ref 3.87–5.11)
RDW: 12.4 % (ref 11.5–15.5)
WBC: 17.7 10*3/uL — ABNORMAL HIGH (ref 4.0–10.5)
nRBC: 0 % (ref 0.0–0.2)

## 2019-03-04 LAB — GLUCOSE, CAPILLARY
Glucose-Capillary: 143 mg/dL — ABNORMAL HIGH (ref 70–99)
Glucose-Capillary: 131 mg/dL — ABNORMAL HIGH (ref 70–99)
Glucose-Capillary: 131 mg/dL — ABNORMAL HIGH (ref 70–99)
Glucose-Capillary: 128 mg/dL — ABNORMAL HIGH (ref 70–99)

## 2019-03-04 LAB — BASIC METABOLIC PANEL
Anion gap: 10 (ref 5–15)
BUN: 18 mg/dL (ref 8–23)
CO2: 25 mmol/L (ref 22–32)
Calcium: 9.1 mg/dL (ref 8.9–10.3)
Chloride: 99 mmol/L (ref 98–111)
Creatinine, Ser: 0.53 mg/dL (ref 0.44–1.00)
GFR calc Af Amer: >60 mL/min (ref >60)
GFR calc non Af Amer: >60 mL/min (ref >60)
Glucose, Bld: 138 mg/dL — ABNORMAL HIGH (ref 70–99)
Potassium: 4.2 mmol/L (ref 3.5–5.1)
Sodium: 134 mmol/L — ABNORMAL LOW (ref 135–145)

## 2019-03-04 NOTE — Plan of Care (Signed)
  Problem: Education: Goal: Knowledge of General Education information will improve Description Including pain rating scale, medication(s)/side effects and non-pharmacologic comfort measures Outcome: Progressing   Problem: Health Behavior/Discharge Planning: Goal: Ability to manage health-related needs will improve Outcome: Progressing   Problem: Clinical Measurements: Goal: Ability to maintain clinical measurements within normal limits will improve Outcome: Progressing Goal: Will remain free from infection Outcome: Progressing Goal: Diagnostic test results will improve Outcome: Progressing Goal: Respiratory complications will improve Outcome: Progressing Goal: Cardiovascular complication will be avoided Outcome: Progressing   Problem: Activity: Goal: Risk for activity intolerance will decrease Outcome: Progressing   Problem: Coping: Goal: Level of anxiety will decrease Outcome: Progressing   Problem: Pain Managment: Goal: General experience of comfort will improve Outcome: Progressing   Problem: Safety: Goal: Ability to remain free from injury will improve Outcome: Progressing   

## 2019-03-04 NOTE — Progress Notes (Signed)
Subjective: Patient was seen and evaluated at bedside on morning rounds. No acute events overnight. She is doing well. Now getting the breathing treatment as scheduled, She slept well last night. No complaint.   Objective:  Vital signs in last 24 hours: Vitals:   03/03/19 1154 03/03/19 1527 03/03/19 1922 03/04/19 0512  BP: 113/75   138/84  Pulse: 94   99  Resp: 18   18  Temp: 97.8 F (36.6 C)   98.1 F (36.7 C)  TempSrc: Oral   Oral  SpO2: 91% 93% 92% 94%  Weight:      Height:       Physical Exam  Constitutional: Pleasant lady, no acute distress Respiratory: No Getting breathing treatment. On [place] 10 li, diffuse rales. (improved) Neurological: Is alert and oriented x 3 Skin: Not diaphoretic. No erythema.   Assessment/Plan:  Active Problems:   DNR (do not resuscitate) discussion   Acute respiratory failure with hypoxemia ([place])   Palliative care by specialist   Weakness generalized  68 yo F PMH COPD, chronic pain grade I DD, who presented to ED 10/11 with SOB. Myalgias, malaise, nightsweats began 2 days prior to presentation.   # Acute hypoxic respiratory failure : Likley IPF/ ILD CTA on 10/12 was notable for bibasilar honeycombing, diffuse ground glass opacities, enlarged right ventricle and pulmonary arteries consistent with pulmonary artery hypertension and chronic changes consistent with interstitial lung disease.   PCCM was consulted and noted possible ILD.  Rheumatologic workup notable for elevated sed rate and positive RF, rest of serology were negative including CCP. High-resolution CT on 10/19 showed significant ground glass opacities and findings consistent with UIP with possible overlying edema vs UIP flare.   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 Despite the lack of mortality benefit and possible harm, patient will require steroids to maintain her pulmonary function.   Due to lack of adequate improvement with Prednisone, she has been  switched to to high dose Solumedrol.  Improving oxygen requirement, down to 10 L now. Will continue to monitor as she receives high dose steroids.   - PCCM on board and will see the patient today. We appreciate their recommendations - Continue Telemetry - Continue HFNC, maintain saturation above >80% - Wean as tolerated - Continue Duoneb TID  - Continue Solumedrol 125mg  QID  # Pulmonary Hypertension # Right Heart Failure  Last echo in 2017 showed grade 1 diastolic dysfunction with preserved EF at 55-60%. BNP on this admission was 1170. TTE on 10/12 showed right ventricle has severely reduced systolic function with moderate pulmonary hypertension. Repeat TTE on 10/19 showed continued right heart failure but not as severe as prior. Heart cath on 10/22 did not show significant pulmonary htn or right heart strain.   cMRI yesterday showed some hypokinesis at the RV base with dyskinesis at RV apex. Dr. Haroldine Laws stated in his note he will follow up these results with the imaging team as it is unusual morphology. No additional intervention required at this time, will continue to monitor.  Hemoglobin dropped to 15.56 today after being stable around 17.8 yesterday. Concerned for bleeding complication after cath on 10/22, however physical examination is negative. Will monitor closely.   - HF is on board and we appreciate their recommendations  - Telemetry - Strict I/Os  - Daily weights - CBC  # LAD Stenosis:  Coronary angio on 10/22 showed a 80% stenosis of the LAD.   - Planned for PCI on 10/26 - NPO on Sunday  midnight -Appreciate HF/cardiolgy recommendations  #Elevated BP: likely in setting of steroid. Now resolved. -Continue to monitor.   # Secondary Polycythemia  Likely secondary to chronic hypoxia with hx of chronic lung disease.     # Chronic Back Pain:  Continue home Norco.   - Norco 5-325mg , 1-2 tablets q6h PRN  # Hx of Depression: Continue Prozac  Dispo:  Anticipated discharge in approximately depending clinical improvement   Julia Pretty, MD 03/04/2019, 6:59 AM Pager: [phone]

## 2019-03-04 NOTE — Progress Notes (Signed)
Increased 02 from 8L HFNC to 10L due to SpO2 87%.  SpO2 increased to 96%

## 2019-03-04 NOTE — Progress Notes (Signed)
NAME:  Julia Massey, MRN:  [id number], DOB:  11/06/1950, LOS: 7 ADMISSION DATE:  02/26/2019, CONSULTATION DATE:  02/19/19 REFERRING MD:  Valli Glance IMTS, CHIEF COMPLAINT:  Acute on chronic respiratory failure  Brief History   68 yo F Hx COPD  Respiratory failure  History of present illness   68 yo F PMH COPD, chronic pain grade I dialstolic dysfunction who presented to ED 10/11 with CC SOB. Myalgias, malaise, nightsweats began 2 days prior to presentation. Patient began taking leftover levaquin from prior infection x 2 doses. Additionally she took [employer], as well as home albuterol. On Sunday, patient with significantly worse SOB. Denied fever, cough, sore throat, rhinitis, HA, n/v/d, dizziness, changes in appetite.   Patient presents with leukocytosos 18.8. In ED she was given duonebs ans started on abx + steroids. COVID-19 negative. Patient placed on NRB with SpO2 92% Presenting ABG: 7.446/ 26/55/18/19/91   Admitted to IMTS and started on BiPAP. PCCM asked to see 10/12.  Past Medical History  COPD Carpal Tunnel Chronic back pain Gout HLD Diastolic heart dysfunction   Results for REBIE, PEALE" (MRN [id number]) as of 02/28/2019 16:47  Ref. Range 05/15/2015 14:25 05/15/2015 16:46 02/19/2019 14:19 02/27/2019 03:03  Anti Nuclear Antibody (ANA) Latest Ref Range: Negative    Negative   ANA Ab, IFA Unknown Negative     ANCA Proteinase 3 Latest Ref Range: 0.0 - 3.5 U/mL <3.5     Anti JO-1 Latest Ref Range: 0.0 - 0.9 AI   <0.2   CCP Antibodies IgG/IgA Latest Ref Range: 0 - 19 units   6 4  Myeloperoxidase Abs Latest Ref Range: 0.0 - 9.0 U/mL <9.0     RA Latex Turbid. Latest Ref Range: 0.0 - 13.9 IU/mL  14.1 (H) 36.6 (H)   Cytoplasmic (C-ANCA) Latest Ref Range: Neg:<1:20 titer   <1:20   P-ANCA Latest Ref Range: Neg:<1:20 titer   <1:20   Atypical P-ANCA titer Latest Ref Range: Neg:<1:20 titer   <1:20   C3 Complement Latest Ref Range: 82 - 167 mg/dL  159    Complement C4,  Body Fluid Latest Ref Range: 14 - 44 mg/dL  30    SSA (Ro) (ENA) Antibody, IgG Latest Ref Range: 0.0 - 0.9 AI   <0.2   SSB (La) (ENA) Antibody, IgG Latest Ref Range: 0.0 - 0.9 AI   <0.2     Significant Hospital Events   10/11 admitted 10/12 PCCM consulted.  10/21 - says not better. On 10LNC at rest and as she talks pulse ox is 88%. Says pre-admit was not on o2 but doing well. Says brother in [place] who is 83 years older Yetta Flock) also diagnosed with pulmonary fibrosis in feb 2020. She wants me to call him. Chart review shows RV strain + but she does not have dema.  Chart review shows essentially negative serology  (RF is < 2x ULN). HRCT shows UIP wiuth GGO - pretty c/w UIP flare  10/22  - relatively normal RHC and no rv failure. 80% LAD stenosis. Start solumedrol 125mg  IV q6h   10/23 -  s/p cardiac MRI. Results pending. Cards prefers to see course with steroids and results of cardiac MRI before deciding on stent. STarted higher dose IV steroids yesterday. Today - feels same. Down to 9L [place] - with o2 in only 1 nostril pulse ox is 90%. 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 Asking for social work help to vote early  Consults:  PCCM  Procedures:   Significant Diagnostic Tests:  10/11 CXR> Chronic interstitial lung disease with new R sided opacities.  10/12 ECHO >>> 10/12 CT angio chest> No PE. enlarged heart, no pericardial effusion. Diffuse ground-glass appearance throughout lungs. No Pleural Effusion. Emphysematous changes.  10/13 ECHO> 1. Left ventricular ejection fraction, by visual estimation, is 55 to 60%. The left ventricle has normal function. Normal left ventricular size. There is no left ventricular hypertrophy.  2. Left ventricular diastolic Doppler parameters are consistent with impaired relaxation pattern of LV diastolic filling.  3. Global right ventricle has severely reduced systolic function.The right ventricular size is mildly enlarged.  4. Left atrial size was  mildly dilated.  5. Right atrial size was normal.  6. The mitral valve is normal in structure. Trace mitral valve regurgitation. No evidence of mitral stenosis.  7. The tricuspid valve is normal in structure. Tricuspid valve regurgitation is mild.  8. The aortic valve is tricuspid Aortic valve regurgitation was not visualized by color flow Doppler. Structurally normal aortic valve, with no evidence of sclerosis or stenosis.  9. The pulmonic valve was not well visualized. Pulmonic valve regurgitation is not visualized by color flow Doppler. 10. Moderately elevated pulmonary artery systolic pressure. 11. The inferior vena cava is dilated in size with >50% respiratory variability, suggesting right atrial pressure of 8 mmHg. 12. Normal LV systolic function; grade 1 diastolic dysfunction; mild LAE; mild RVE with severe RV dysfunction; cannot R/O thrombus in RV on apical images; suggest CTA to further assess; mild TR; moderate pulmonary hypertension.  10/13 CXR> Chronic diffuse bilateral interstitial prominence, with some interval improvement-- likely improved pulmonary edema \  Echo 10/19:  Left Ventricle: Left ventricular ejection fraction, by visual estimation, is 50 to 55%. The left ventricle has mildly decreased function. There is no left ventricular hypertrophy. Normal left ventricular size. Spectral Doppler shows Left ventricular  diastolic Doppler parameters are consistent with impaired relaxation pattern of LV diastolic filling. Markedly abnormal septal motion. Right Ventricle: The right ventricular size is mildly enlarged. No increase in right ventricular wall thickness. Global RV systolic function is has mildly reduced systolic function.    Micro Data:  10/11 SARS CoV2> neg  10/12 RVP neg  Antimicrobials:  Azithroymycin 10/11-10/13 Rocephin 10/11-10/13  Interim history/subjective:    03/04/2019  -d3 solumedrol at 125mg  Q6h Says she is better but pulse ox dropped preciptiously when  I Took her down to 6L [place]. Seems ok at rest on 8L [place] -> 92%. Got dyspneic talking but says she want to BR and sat and ate.  Cardiac MRI with RV EF 37%  Objective   Blood pressure 132/76, pulse 98, temperature 98 F (36.7 C), temperature source Oral, resp. rate 18, height 5\' 6"  (1.676 m), weight 45.4 kg, SpO2 94 %.    FiO2 (%):  [60 %] 60 %   Intake/Output Summary (Last 24 hours) at 03/04/2019 1509 Last data filed at 03/04/2019 1300 Gross per 24 hour  Intake 580 ml  Output 1050 ml  Net -470 ml   Filed Weights   03/02/19 0610 03/03/19 0432 03/04/19 0717  Weight: 66 kg 65.8 kg 45.4 kg   General Appearance:  Looks better. NOT DESATURATING as she talks - ? better Head:  Normocephalic, without obvious abnormality, atraumatic Eyes:  PERRL - yes, conjunctiva/corneas - muddy     Ears:  Normal external ear canals, both ears Nose:  G tube - no Throat:  ETT TUBE - no , OG tube -  no Neck:  Supple,  No enlargement/tenderness/nodules Lungs: Bilateral LL vecro crackles and class 4 dsypnea as she talks Heart:  S1 and S2 normal, no murmur, CVP - no.  Pressors - no Abdomen:  Soft, no masses, no organomegaly Genitalia / Rectal:  Not done Extremities:  Extremities- intac Skin:  ntact in exposed areas . Sacral area - not examined Neurologic:  Sedation - none -> RASS - +1 . Moves all 4s - yes. CAM-ICU - neg . Orientation - x3 +         LABS    PULMONARY Recent Labs  Lab 18-Mar-2019 0956 2019/03/18 1001 03-18-2019 1006  PHART  --  7.452*  --   PCO2ART  --  37.3  --   PO2ART  --  270.0*  --   HCO3 25.3  29.0* 26.1 27.6  TCO2 26  30 27 29   O2SAT 70.0  70.0 100.0 70.0    CBC Recent Labs  Lab 03/02/19 0509 03/03/19 0416 03/04/19 0327  HGB 17.8* 15.6* 15.0  HCT 53.1* 46.7* 44.4  WBC 15.8* 21.0* 17.7*  PLT 424* 528* 481*    COAGULATION No results for input(s): INR in the last 168 hours.  CARDIAC  No results for input(s): TROPONINI in the last 168 hours. No results for  input(s): PROBNP in the last 168 hours.   CHEMISTRY Recent Labs  Lab 02/28/19 0452 03/18/2019 0445  Mar 18, 2019 1001 18-Mar-2019 1006 03/02/19 0509 03/03/19 0416 03/04/19 0327  NA 132* 134*   < > 132* 134* 134* 134* 134*  K 3.8 3.6   < > 4.6 4.2 4.5 4.5 4.2  CL 94* 97*  --   --   --  97* 99 99  CO2 25 24  --   --   --  23 22 25   GLUCOSE 109* 103*  --   --   --  165* 139* 138*  BUN 20 18  --   --   --  17 20 18   CREATININE 0.55 0.59  --   --   --  0.46 0.64 0.53  CALCIUM 9.4 9.3  --   --   --  9.4 9.5 9.1   < > = values in this interval not displayed.   Estimated Creatinine Clearance: 49.6 mL/min (by C-G formula based on SCr of 0.53 mg/dL).   LIVER No results for input(s): AST, ALT, ALKPHOS, BILITOT, PROT, ALBUMIN, INR in the last 168 hours.   INFECTIOUS Recent Labs  Lab 18-Mar-2019 0445 03/02/19 0705  PROCALCITON <0.10 <0.10     ENDOCRINE CBG (last 3)  Recent Labs    03/03/19 1613 03/04/19 0106 03/04/19 0631  GLUCAP 175* 143* 131*      CLINICAL DATA:  Inpatient.  Evaluate for interstitial lung disease.  EXAM: CT CHEST WITHOUT CONTRAST personally visualzied this cT and agree  TECHNIQUE: Multidetector CT imaging of the chest was performed following the standard protocol without intravenous contrast. High resolution imaging of the lungs, as well as inspiratory and expiratory imaging, was performed.  COMPARISON:  02/19/2019 chest CT angiogram. 02/20/2019 chest radiograph.  FINDINGS: Cardiovascular: Mild cardiomegaly, stable. No significant pericardial effusion/thickening. Left anterior descending coronary atherosclerosis. Atherosclerotic nonaneurysmal thoracic aorta. Stable dilated main pulmonary artery (3.6 cm diameter).  Mediastinum/Nodes: No discrete thyroid nodules. Unremarkable esophagus. No axillary adenopathy. Enlarged 1.3 cm right paratracheal node (series 5/image 63), stable from 02/19/2019 CT. No new pathologically enlarged mediastinal nodes.  No discrete hilar adenopathy on this noncontrast scan.  Lungs/Pleura: No pneumothorax. No pleural  effusion. No acute consolidative airspace disease or lung masses. Apical left upper lobe 5 mm solid pulmonary nodule (series 6/image 27), stable since 02/19/2019, obscured by streak artifact on 05/15/2015 CT. No additional significant pulmonary nodules. There is extensive patchy confluent ground-glass opacity and reticulation throughout the peripheral lungs bilaterally with associated marked traction bronchiectasis, volume loss and distortion. These findings have a strong basilar predominance. There is moderate honeycombing at both lung bases. Findings have substantially progressed since 05/15/2015 chest CT. No significant lobular air trapping or evidence of tracheobronchomalacia on the expiration sequence.  Upper abdomen: Small hiatal hernia.  Musculoskeletal: No aggressive appearing focal osseous lesions. Moderate thoracic spondylosis.  IMPRESSION: 1. Spectrum of findings compatible with basilar predominant fibrotic interstitial lung disease with moderate honeycombing. 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 Significant ground-glass component may represent superimposed pulmonary edema or UIP flare. Findings are consistent with UIP per consensus guidelines: Diagnosis of Idiopathic Pulmonary Fibrosis: An Official ATS/ERS/JRS/ALAT Clinical Practice Guideline. Am Rosezetta SchlatterJ Respir Crit Care Med Vol 198, Iss 5, [phone], Jan 08 2017. 2. Cardiomegaly.  One vessel coronary atherosclerosis. 3. Dilated main pulmonary artery, suggesting pulmonary arterial hypertension. 4. Nonspecific mild mediastinal lymphadenopathy, most likely reactive. 5. Solitary 5 mm solid apical left upper lobe pulmonary nodule. No follow-up needed if patient is low-risk. Non-contrast chest CT can be considered in 12 months if patient is high-risk. This recommendation follows the consensus  statement: Guidelines for Management of Incidental Pulmonary Nodules Detected on CT Images:From the Fleischner Society 2017; published online before print (10.1148/radiol.[id number]). 6. Small hiatal hernia.  Aortic Atherosclerosis (ICD10-I70.0).   Electronically Signed   By: Delbert PhenixJason A Poff M.D.   On: 02/26/2019 10:33  Swallow eval 2017 IMPRESSION: 1. Moderately degraded exam, as detailed above. 2. Penetration and trace aspiration with single swallows. Please see speech pathology evaluation. 3. Esophageal dysmotility, likely presbyesophagus. 4. No high-grade stricture or other anatomic cause for patient's symptoms within the thoracic esophagus.   Electronically Signed   By: Jeronimo GreavesKyle  Talbot M.D.   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   - I also think she  currently has idiopathic flare (not fitting in is the similar presentation in 2017 and long gap after that). Other ddx is things like acute eos pneumonia but too sick to bronch or biopsy   03/04/2019 -? Improved - hard to say.  On solumedrol 125mg  IV q6h since 02/15/2019   Plan  -Rx as IPF flare = hopefully she improves. Explained that if IPF flare long term prognosis is poor - she is at peace with this   - o2 for pulse ox >88%  - solumedrol to continue - total steroid course 3 weeks. Aim to go po 03/05/2019 or 02/08/2019   -- goals of care discussion appropriate - recommend full medical care but no CPR If declines - IMTS to sort out   - await cardiology decision on stent for LAD  - CCM will see again 03/05/19       SIGNATURE    Dr. Kalman ShanMurali Zoye Chandra, M.D., F.C.C.P,  Pulmonary and Critical Care Medicine Staff Physician, [facility] Director - Interstitial Lung  Disease  Program  Pulmonary Fibrosis [facility] [place], [place], [id number]  Pager:  [phone], If no answer or between  15:00h - 7:00h: call [phone] Telephone: [phone]  3:09 PM 03/04/2019

## 2019-03-05 DIAGNOSIS — R0603 Acute respiratory distress: Secondary | ICD-10-CM | POA: Diagnosis not present

## 2019-03-05 DIAGNOSIS — I10 Essential (primary) hypertension: Secondary | ICD-10-CM | POA: Diagnosis not present

## 2019-03-05 DIAGNOSIS — I50810 Right heart failure, unspecified: Secondary | ICD-10-CM | POA: Diagnosis not present

## 2019-03-05 DIAGNOSIS — Z515 Encounter for palliative care: Secondary | ICD-10-CM | POA: Diagnosis not present

## 2019-03-05 DIAGNOSIS — Z7189 Other specified counseling: Secondary | ICD-10-CM | POA: Diagnosis not present

## 2019-03-05 DIAGNOSIS — M549 Dorsalgia, unspecified: Secondary | ICD-10-CM | POA: Diagnosis not present

## 2019-03-05 DIAGNOSIS — J849 Interstitial pulmonary disease, unspecified: Secondary | ICD-10-CM | POA: Diagnosis not present

## 2019-03-05 DIAGNOSIS — R531 Weakness: Secondary | ICD-10-CM | POA: Diagnosis not present

## 2019-03-05 DIAGNOSIS — I2510 Atherosclerotic heart disease of native coronary artery without angina pectoris: Secondary | ICD-10-CM | POA: Diagnosis not present

## 2019-03-05 DIAGNOSIS — G8929 Other chronic pain: Secondary | ICD-10-CM | POA: Diagnosis not present

## 2019-03-05 DIAGNOSIS — I272 Pulmonary hypertension, unspecified: Secondary | ICD-10-CM | POA: Diagnosis not present

## 2019-03-05 DIAGNOSIS — R0600 Dyspnea, unspecified: Secondary | ICD-10-CM | POA: Diagnosis not present

## 2019-03-05 DIAGNOSIS — R0609 Other forms of dyspnea: Secondary | ICD-10-CM | POA: Diagnosis not present

## 2019-03-05 DIAGNOSIS — M069 Rheumatoid arthritis, unspecified: Secondary | ICD-10-CM | POA: Diagnosis not present

## 2019-03-05 DIAGNOSIS — J9601 Acute respiratory failure with hypoxia: Secondary | ICD-10-CM | POA: Diagnosis not present

## 2019-03-05 DIAGNOSIS — D751 Secondary polycythemia: Secondary | ICD-10-CM | POA: Diagnosis not present

## 2019-03-05 LAB — GLUCOSE, CAPILLARY
Glucose-Capillary: 115 mg/dL — ABNORMAL HIGH (ref 70–99)
Glucose-Capillary: 108 mg/dL — ABNORMAL HIGH (ref 70–99)
Glucose-Capillary: 150 mg/dL — ABNORMAL HIGH (ref 70–99)

## 2019-03-05 LAB — CBC WITH DIFFERENTIAL/PLATELET
Abs Immature Granulocytes: 0.2 10*3/uL — ABNORMAL HIGH (ref 0.00–0.07)
Basophils Absolute: 0 10*3/uL (ref 0.0–0.1)
Basophils Relative: 0 %
Eosinophils Absolute: 0 10*3/uL (ref 0.0–0.5)
Eosinophils Relative: 0 %
HCT: 43.2 % (ref 36.0–46.0)
Hemoglobin: 14.2 g/dL (ref 12.0–15.0)
Immature Granulocytes: 1 %
Lymphocytes Relative: 3 %
Lymphs Abs: 0.5 10*3/uL — ABNORMAL LOW (ref 0.7–4.0)
MCH: 30.6 pg (ref 26.0–34.0)
MCHC: 32.9 g/dL (ref 30.0–36.0)
MCV: 93.1 fL (ref 80.0–100.0)
Monocytes Absolute: 0.7 10*3/uL (ref 0.1–1.0)
Monocytes Relative: 4 %
Neutro Abs: 16.8 10*3/uL — ABNORMAL HIGH (ref 1.7–7.7)
Neutrophils Relative %: 92 %
Platelets: 415 10*3/uL — ABNORMAL HIGH (ref 150–400)
RBC: 4.64 MIL/uL (ref 3.87–5.11)
RDW: 12.5 % (ref 11.5–15.5)
WBC: 18.3 10*3/uL — ABNORMAL HIGH (ref 4.0–10.5)
nRBC: 0 % (ref 0.0–0.2)

## 2019-03-05 LAB — BASIC METABOLIC PANEL
Anion gap: 11 (ref 5–15)
BUN: 18 mg/dL (ref 8–23)
CO2: 26 mmol/L (ref 22–32)
Calcium: 9.2 mg/dL (ref 8.9–10.3)
Chloride: 100 mmol/L (ref 98–111)
Creatinine, Ser: 0.47 mg/dL (ref 0.44–1.00)
GFR calc Af Amer: >60 mL/min (ref >60)
GFR calc non Af Amer: >60 mL/min (ref >60)
Glucose, Bld: 114 mg/dL — ABNORMAL HIGH (ref 70–99)
Potassium: 4.2 mmol/L (ref 3.5–5.1)
Sodium: 137 mmol/L (ref 135–145)

## 2019-03-05 MED ORDER — SODIUM CHLORIDE 0.9 % IV SOLN: 250.0000 mL | INTRAVENOUS | Status: DC | PRN

## 2019-03-05 MED ORDER — SODIUM CHLORIDE 0.9 % IV SOLN
INTRAVENOUS | Status: DC
  Administered 2019-03-05 – 2019-03-06 (×2): via INTRAVENOUS

## 2019-03-05 MED ORDER — SODIUM CHLORIDE 0.9% FLUSH: 3.0000 mL | INTRAVENOUS | Status: DC | PRN

## 2019-03-05 MED ORDER — ASPIRIN 81 MG PO CHEW
81.0000 mg | CHEWABLE_TABLET | ORAL | Status: AC
  Administered 2019-03-05: 81 mg via ORAL

## 2019-03-05 MED ORDER — SODIUM CHLORIDE 0.9% FLUSH
3.0000 mL | Freq: Two times a day (BID) | INTRAVENOUS | Status: DC
  Administered 2019-03-05 – 2019-03-12 (×12): 3 mL via INTRAVENOUS

## 2019-03-05 MED ORDER — ASPIRIN 81 MG PO CHEW: 81.0000 mg | CHEWABLE_TABLET | ORAL | Status: DC

## 2019-03-05 MED ORDER — SODIUM CHLORIDE 0.9 % IV SOLN: INTRAVENOUS | Status: DC

## 2019-03-05 NOTE — Progress Notes (Signed)
Internal Medicine Attending:   I saw and examined the patient. I reviewed the resident's note and I agree with the resident's findings and plan as documented in the resident's note.  Patient states that she feels very anxious about upcoming procedure today and did not want to do the cath at this time.  Patient is initially made to the hospital with acute hypoxemic respiratory failure and was found to have ILD possibly secondary to IPF versus RA induced ILD (appears unlikely).  PCCM follow-up and recommendations appreciated.  We will continue to try to wean down her oxygen.  DC Solu-Medrol tomorrow and start patient on prednisone 1 mg/kg/day.  Continue with PT and OT and continue mobilization for now.  Continue with DuoNebs 3 times daily.  Patient also noted to have 80% LAD stenosis on cardiac catheterization.  Patient did not want to go for a stent today.  Cardiology follow-up recommendation appreciated.  They had a long discussion with her about the procedure and patient is willing to go for the her procedure tomorrow.  No further work-up at this time.  Continue with aspirin, Plavix, atorvastatin 80 mg for now.

## 2019-03-05 NOTE — Progress Notes (Addendum)
Advanced Heart Failure Rounding Note  PCP-Cardiologist: Dr. Jones Broom   Subjective:    68 y/o F w/ h/o COPD and fibromyalgia. Admitted in 2017 for respiratory failure and had prolonged course with VDRF. Subsequently recovered well and was able to be quite active and independent. Gave up smoking. Admitted with about 1 week of progressive SOB, chest tightness and weakness. Found to have hypoxic respiratory failure. CT negative for PE but suggestive of PAH. Echo EF 60% with severe RV dilation and dysfunction. Hi-res chest CT c/w UIP. Started on steroids.    Diagnostic R/LHC 10/22 with no significant PAH. 80% mLAD lesion - staged intervention planned.   cMRI 03/02/19 1.  Normal LV size and function small LV cavity EF 69% 2. RV elongated 75 mm with hypokinesis of the RV base and dyskinetic segment at apex RVEF 39% with unusual morphology 3.  No delayed gadolinium uptake in RV/LV or pericardium 4.  Normal AV,MV, TV 5.  No pericardial effusion 6.  Normal aortic root 3.5 cm  Very anxious this AM about cath. No chest pain.   Objective:   Weight Range: 45.7 kg Body mass index is 16.27 kg/m.   Vital Signs:   Temp:  [97.8 F (36.6 C)-98.2 F (36.8 C)] 98.2 F (36.8 C) (10/26 0330) Pulse Rate:  [91-98] 91 (10/26 0330) Resp:  [18-19] 18 (10/26 0330) BP: (118-142)/(76-96) 142/96 (10/26 0330) SpO2:  [91 %-99 %] 99 % (10/26 0732) FiO2 (%):  [60 %] 60 % (10/25 1347) Weight:  [45.7 kg] 45.7 kg (10/26 0625) Last BM Date: 03/03/19  Weight change: Filed Weights   03/03/19 0432 03/04/19 0717 03/05/19 0625  Weight: 65.8 kg 45.4 kg 45.7 kg    Intake/Output:   Intake/Output Summary (Last 24 hours) at 03/05/2019 0751 Last data filed at 03/05/2019 0625 Gross per 24 hour  Intake 600 ml  Output 1700 ml  Net -1100 ml      Physical Exam    PHYSICAL EXAM: General:  Well appearing. No respiratory difficulty a bit anxious HEENT: normal Neck: supple. no JVD. Carotids 2+ bilat; no  bruits. No lymphadenopathy or thyromegaly appreciated. Cor: PMI nondisplaced. Regular rate & rhythm. No rubs, gallops or murmurs. Lungs: bilateral crackles anteriorly (ILD) Abdomen: soft, nontender, nondistended. No hepatosplenomegaly. No bruits or masses. Good bowel sounds. Extremities: no cyanosis, clubbing, rash, edema Neuro: alert & oriented x 3, cranial nerves grossly intact. moves all 4 extremities w/o difficulty. Affect pleasant.   Telemetry  NSR 80-90s personally reviewed.   EKG    N/A   Labs    CBC Recent Labs    03/03/19 0416 03/04/19 0327  WBC 21.0* 17.7*  NEUTROABS 19.4* 16.5*  HGB 15.6* 15.0  HCT 46.7* 44.4  MCV 91.2 92.1  PLT 528* 481*   Basic Metabolic Panel Recent Labs    60/45/40 0416 03/04/19 0327  NA 134* 134*  K 4.5 4.2  CL 99 99  CO2 22 25  GLUCOSE 139* 138*  BUN 20 18  CREATININE 0.64 0.53  CALCIUM 9.5 9.1   Liver Function Tests No results for input(s): AST, ALT, ALKPHOS, BILITOT, PROT, ALBUMIN in the last 72 hours. No results for input(s): LIPASE, AMYLASE in the last 72 hours. Cardiac Enzymes No results for input(s): CKTOTAL, CKMB, CKMBINDEX, TROPONINI in the last 72 hours.  BNP: BNP (last 3 results) Recent Labs    03/05/2019 1203  BNP 1,175.2*    ProBNP (last 3 results) No results for input(s): PROBNP in the last 8760 hours.  D-Dimer No results for input(s): DDIMER in the last 72 hours. Hemoglobin A1C No results for input(s): HGBA1C in the last 72 hours. Fasting Lipid Panel No results for input(s): CHOL, HDL, LDLCALC, TRIG, CHOLHDL, LDLDIRECT in the last 72 hours. Thyroid Function Tests No results for input(s): TSH, T4TOTAL, T3FREE, THYROIDAB in the last 72 hours.  Invalid input(s): FREET3  Other results:   Imaging    No results found.   Medications:     Scheduled Medications: . aspirin  81 mg Oral Daily  . atorvastatin  80 mg Oral q1800  . clopidogrel  75 mg Oral Q breakfast  . enoxaparin (LOVENOX)  injection  40 mg Subcutaneous Q24H  . FLUoxetine  40 mg Oral Daily  . influenza vaccine adjuvanted  0.5 mL Intramuscular Tomorrow-1000  . ipratropium-albuterol  3 mL Nebulization TID  . mouth rinse  15 mL Mouth Rinse q12n4p  . methylPREDNISolone (SOLU-MEDROL) injection  125 mg Intravenous Q6H  . sodium chloride flush  3 mL Intravenous Q12H    Infusions: . sodium chloride      PRN Medications: sodium chloride, [DISCONTINUED] acetaminophen **OR** acetaminophen, acetaminophen, albuterol, calcium carbonate, hydrALAZINE, HYDROcodone-acetaminophen, ondansetron (ZOFRAN) IV, senna-docusate, sodium chloride, sodium chloride flush   Assessment/Plan    1 Acute on chronic hypoxic respiratory failure - has underlying COPD with prolonged intubation several years ago but got back to a very functional baseline - w/u now most c/w UIP and PH - CT negative for PE - Pulmonary managing - continue prednisone and nebs  2. Abnormal RV - RHC without evidence of significant PAH - cMRI with mildly decreased RV function and unusual RV morphology of un certain etiology. Will review with imaging team   3. CAD with Exertional chest tightness - cath with 80% LAD.  - plan staged PCI today. She is on DAPT +statin. Will check FLP in the am. LDL goal < 70 mg/dL. Will NOT be able to stop DAPT for 6 months after stent placement, thus if need for lung biopsy, will need to wait delay until after 6 months    Length of Stay: [address], PA-C  03/05/2019, 7:51 AM  Advanced Heart Failure Team Pager [phone] (M-F; 7a - 4p)  Please contact [facility] for night-coverage after hours (4p -7a ) and weekends on [email or web address]   Patient seen and examined with the above-signed Advanced Practice Provider and/or Housestaff. I personally reviewed laboratory data, imaging studies and relevant notes. I independently examined the patient and formulated the important aspects of the plan. I have edited the note to  reflect any of my changes or salient points. I have personally discussed the plan with the patient and/or family.  RHC last week with no PAH. RV morphologically abnormal on cMRI of unclear etiology.   Had planned for PCI of LAD today. Patient very anxious. Discussed procedure in detail. She is willing to proceed but would like to wait until tomorrow. We will schedule for first case tomorrow. Continue DAPT.   Glori Bickers, MD  10:33 AM

## 2019-03-05 NOTE — Progress Notes (Signed)
Subjective:    Julia Massey is feeling very anxious today about her procedure and states she just has a bad feeling about it. She would prefer to not have it done today. Otherwise, notes that her breathing feels better. She is very hungry at this time since she has been NPO.   She requests that we talked to Dr. Gala Romney about the procedure in addition to calling her husband to update him.   Discussed long term prognosis of IPF very briefly. She stated she was unaware of what her long term prognosis is and cannot recall her discussion with Dr. Marchelle Gearing. She is adamant that she is not signing any DNR/DNI paperwork.  Objective:  Vital signs in last 24 hours: Vitals:   03/04/19 2024 03/04/19 2038 03/05/19 0330 03/05/19 0625  BP: 118/77  (!) 142/96   Pulse: 92  91   Resp: 19  18   Temp:   98.2 F (36.8 C)   TempSrc:   Oral   SpO2: 99% 99% 96%   Weight:    45.7 kg  Height:        Physical Exam Vitals signs and nursing note reviewed.  Constitutional:      General: She is not in acute distress.    Appearance: She is well-developed. She is not toxic-appearing.  Cardiovascular:     Rate and Rhythm: Normal rate and regular rhythm.     Heart sounds: No murmur.  Pulmonary:     Effort: No tachypnea, accessory muscle usage or respiratory distress.     Breath sounds: Rales (bilateral in all lung fields, most severe bibasilar. Dry in nature, unchanged from previous examination) present. No decreased breath sounds, wheezing or rhonchi.  Abdominal:     General: Bowel sounds are normal.     Palpations: Abdomen is soft.  Skin:    General: Skin is warm and dry.     Findings: No ecchymosis or rash.  Neurological:     General: No focal deficit present.     Mental Status: She is alert and oriented to person, place, and time.  Psychiatric:        Mood and Affect: Mood is anxious.        Behavior: Behavior normal.    Assessment/Plan:  Active Problems:   DNR (do not resuscitate)  discussion   Acute respiratory failure with hypoxemia (HCC)   Palliative care by specialist   Weakness generalized  # Acute hypoxic respiratory failure  CTA on 10/12 was notable for bibasilar honeycombing, diffuse ground glass opacities, enlarged right ventricle and pulmonary arteries consistent with pulmonary artery hypertension and chronic changes consistent with interstitial lung disease. PCCM was consulted and noted possible ILD. Rheumatologic workup notable for elevated sed rate and positive RF, rest of serology were negative including CCP. High-resolution CT on 10/19 showed significant ground glass opacities and findings consistent with UIP with possible overlying edema vs UIP flare. 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 Despite the lack of mortality benefit and possible harm, patient will require steroids to maintain her pulmonary function. Due to lack of adequate improvement with Prednisone, she has been switched to to high dose Solumedrol.  HFNC was able to be weaned down to 8L over this weekend but was moved back up to 10L. Overall, small improvement with steroid. Will discuss taper with PCCM. Would likely do better with slow taper.   - PCCM on board and we appreciate their recommendations - Telemetry - HFNC, maintain saturation above >80% - Wean  as tolerated - Duoneb TID  - Solumedrol 125mg  QID  # Pulmonary Hypertension # Right Heart Failure  Last echo in 2017 showed grade 1 diastolic dysfunction with preserved EF at 55-60%. BNP on this admission was 1170. TTE on 10/12 showed right ventricle has severely reduced systolic function with moderate pulmonary hypertension. Repeat TTE on 10/19 showed continued right heart failure but not as severe as prior. Heart cath on 10/22 did not show significant pulmonary htn or right heart strain. cMRI showed some hypokinesis at the RV base with dyskinesis at RV apex. Dr. Haroldine Laws stated in his note he will follow up these  results with the imaging team as it is unusual morphology. No additional intervention required at this time, will continue to monitor.  - HF is on board and we appreciate their recommendations  - Telemetry - Strict I/Os  - Daily weights  # LAD Stenosis:  Coronary angio on 10/22 showed a 80% stenosis of the LAD. Cardiology recommends PCI but preferred to wait until improvement in respiratory status. However given that this is unlikely to be in the near future, Dr. Chase Caller has okayed moving forward with PCI if she can tolerate lying flat. It is currently scheduled for 10/26 per Dr. Clayborne Dana note.  This morning, [name] notes extreme anxiousness about having the procedure and would like to hold off. Given that this is not contributing to her prognosis, would recommend holding off for now. HF plans to discuss this with her today and will follow up their decision.   #Elevated BP:  BP better today. No intervention necessary at this time. Continue to monitor.   # Secondary Polycythemia  Likely secondary to chronic hypoxia with hx of chronic lung disease.     # Chronic Back Pain:  Continue home Norco.   - Norco 5-325mg , 1-2 tablets q6h PRN  # Hx of Depression: Continue Prozac   Dispo: Anticipated discharge pending clinical improvement.   Dr. Jose Persia Internal Medicine PGY-1  Pager: [phone] 03/05/2019, 7:12 AM

## 2019-03-05 NOTE — Progress Notes (Signed)
Attempted to contact patient's husband @ home phone # and cell phone #, both twice, without any answer. No voice message left.

## 2019-03-05 NOTE — Progress Notes (Signed)
CSW received an email from [employer] about patient voting in the [employer]. CSW printed out the flyer that was attached.  CSW met with the patient at bedside and went over the flyer. CSW explained that the patient could request a ballot by mail but the deadline is 03/08/2019 at 5:00pm. The patient stated that she had requested a ballot by mail last week. CSW explained that it unlawful for a medical staff member to help her complete the ballot. CSW continued to explain that a member of the patient's immediate family could assist.   The patient stated that she appreciated it but she had other things to worry about at this time. CSW thanked her for her time and left the flyer at bedside.   Domenic Schwab, MSW, [place] Work[facility]  [employer]  [phone]

## 2019-03-05 NOTE — Progress Notes (Signed)
NAME:  Julia Massey, MRN:  [id number], DOB:  1950/12/09, LOS: 15 ADMISSION DATE:  02/10/2019, CONSULTATION DATE:  02/19/19 REFERRING MD:  Daron Offer IMTS, CHIEF COMPLAINT:  Acute on chronic respiratory failure  Brief History   68 year old with COPD, diastolic heart failure presenting with acute hypoxic respiratory failure on high flow nasal cannula Work-up consists of connective tissue serology showing elevated rheumatoid factor.  She had been told as an outpatient that she may have rheumatoid arthritis versus gout She is an ex-smoker, quit in 2017.  Used to work as a Air cabin crew.  No known exposures with no birds or feather pillows or comforters or mold exposure  Past Medical History  COPD Carpal Tunnel Chronic back pain Gout HLD Diastolic heart dysfunction   Significant Hospital Events   10/11 admitted  10/12 PCCM consulted.  10/21 - says not better. On 10LNC at rest and as she talks pulse ox is 88%. Says pre-admit was not on o2 but doing well. Says brother in [place] who is 6 years older Brooke Bonito) also diagnosed with pulmonary fibrosis in feb 2020. She wants me to call him. Chart review shows RV strain + but she does not have dema.  Chart review shows essentially negative serology  (RF is < 2x ULN). HRCT shows UIP wiuth GGO - pretty c/w UIP flare  10/22  - Relatively normal RHC and no rv failure. 80% LAD stenosis. Start solumedrol 125mg  IV q6h. Started higher dose IV steroids (Solumedrol 125 q6 hrs)  10/23 -  s/p cardiac MRI. Results pending. Cards prefers to see course with steroids and results of cardiac MRI before deciding on stent.   Consults:  PCCM  Procedures:   Significant Diagnostic Tests:  10/12 CT angio chest> No PE. enlarged heart, no pericardial effusion. Diffuse ground-glass appearance throughout lungs. No Pleural Effusion. Emphysematous changes.   10/19 CT high-resolution> Basilar predominant fibrosis honeycombing.  Definite UIP pattern.  Reviewed images personally.  Micro Data:  10/11 SARS CoV2> neg  10/12 RVP neg  Antimicrobials:  Azithroymycin 10/11-10/13 Rocephin 10/11-10/13  Interim history/subjective:  No acute events.  States that her breathing is stable without any change.  Objective   Blood pressure (!) 123/91, pulse 99, temperature 97.8 F (36.6 C), temperature source Oral, resp. rate 18, height 5\' 6"  (1.676 m), weight 45.7 kg, SpO2 97 %.        Intake/Output Summary (Last 24 hours) at 03/05/2019 1416 Last data filed at 03/05/2019 1128 Gross per 24 hour  Intake 600 ml  Output 1850 ml  Net -1250 ml   Filed Weights   03/03/19 0432 03/04/19 0717 03/05/19 0625  Weight: 65.8 kg 45.4 kg 45.7 kg   Gen:      No acute distress HEENT:  EOMI, sclera anicteric Neck:     No masses; no thyromegaly Lungs:    Bilateral crackles CV:         Regular rate and rhythm; no murmurs Abd:      + bowel sounds; soft, non-tender; no palpable masses, no distension Ext:    No edema; adequate peripheral perfusion Skin:      Warm and dry; no rash Neuro: alert and oriented x 3  Resolved Hospital Problem list     Assessment & Plan:   Acute on chronic respiratory failure with hypoxemia CT findings shows progressive UIP pattern pulmonary fibrosis which is consistent with IPF.  There is a question of RA ILD which can also present with UIP pattern but serologies not  impressive  Plan Continue current dose of Solu-Medrol Can change to prednisone at 1 mg/kg dose tomorrow. On schedule for heart catheterization tomorrow. Okay with starting antiplatelet therapy in case of stent as we do not have any plans for lung biopsy. This pattern of pulmonary fibrosis does not need a biopsy for diagnosis Continue to wean down oxygen as tolerated PT/OT, get out of bed and mobilize  PCCM will continue to follow.  SIGNATURE   Marshell Garfinkel MD [facility] Pager [phone] If no answer call [phone]  03/05/2019, 2:32 PM

## 2019-03-05 NOTE — Plan of Care (Signed)
  Problem: Education: Goal: Knowledge of General Education information will improve Description: Including pain rating scale, medication(s)/side effects and non-pharmacologic comfort measures Outcome: Progressing   Problem: Health Behavior/Discharge Planning: Goal: Ability to manage health-related needs will improve Outcome: Progressing   Problem: Clinical Measurements: Goal: Ability to maintain clinical measurements within normal limits will improve Outcome: Progressing Goal: Will remain free from infection Outcome: Progressing Goal: Diagnostic test results will improve Outcome: Progressing Goal: Respiratory complications will improve Outcome: Progressing Note: Patient is currently on 15 L/min and her O2 saturation is at 95-97%. She was restless last night about the upcoming stent and had episodes to where her O2 saturation was trending to the mid 80s. After adjusting from 13L/min to  15L/min and calling Dr. Sung Amabile to speak to her about the PCI, she is more relaxed about the procedure. Goal: Cardiovascular complication will be avoided Outcome: Progressing   Problem: Activity: Goal: Risk for activity intolerance will decrease Outcome: Progressing   Problem: Coping: Goal: Level of anxiety will decrease Outcome: Progressing   Problem: Pain Managment: Goal: General experience of comfort will improve Outcome: Progressing   Problem: Safety: Goal: Ability to remain free from injury will improve Outcome: Progressing

## 2019-03-05 NOTE — Progress Notes (Signed)
Patient ID: Julia Massey, female   DOB: 08-17-1950, 68 y.o.   MRN: [id number]  This NP visited patient at the bedside as a follow up for palliative medicine needs and emotional support.  She is "very upset" and visibly agitated.  She is refusing apparently planned cardiac stent placement for today.  " No one knows what the other one is doing, I don't feel good about this, I am not doing anything today until I talk with the doctors'  Sat at bedside, active listening.  Assured Julia Massey decision is hers. Emotional support offered.  Bedside RN is currently in discussion with attendings.  Patient and I did discuss the difficult decisions that she continues to face 2/2 to her multiple co-morbidites.  She is open to ongoing conversation with this NP hoping to help her define her GOCs.  I will return tomorrow to visit with Julia Massey.  Discussed the importance of continued conversation with her family and her  medical providers regarding overall plan of care and treatment options,  ensuring decisions are within the context of the patients values and GOCs.  Questions and concerns addressed     Total time spent on the unit was 35 minutes  Greater than 50% of the time was spent in counseling and coordination of care  Wadie Lessen NP  Palliative Medicine Team Team Phone # [phone] Pager [phone]

## 2019-03-05 NOTE — Progress Notes (Signed)
Physical Therapy Treatment Patient Details Name: Julia Massey MRN: [id number] DOB: Mar 16, 1951 Today's Date: 03/05/2019    History of Present Illness Pt is a 68 y/o female admitted secondary to acute hypoxic respiratory failure likely secondary to acute pneumonitis. Pt placed on BiPap which has since been removed. PMH includes COPD and gout. Pt now s/p heart cath on 10/22    PT Comments    Pt remains limited by fatigue and pulmonary limitations, desaturating to 84% with transfer and limited ambulation while on 15L HFNC. Pt declining therapeutic exercise upon completion of functional mobility 2/2 fatigue. Pt reports she often breaths through her mouth, attempting nasal canula in mouth may increase tolerance for mobility. Pt will benefit from continued acute PT services to improve endurance and facilitate a return to her prior level of function.   Follow Up Recommendations  Home health PT(pending progress)     Equipment Recommendations  Rolling walker with 5" wheels    Recommendations for Other Services       Precautions / Restrictions Precautions Precautions: Fall Precaution Comments: Watch O2 sats Restrictions Weight Bearing Restrictions: No    Mobility  Bed Mobility Overal bed mobility: Needs Assistance Bed Mobility: Supine to Sit;Sit to Supine     Supine to sit: Supervision Sit to supine: Supervision   General bed mobility comments: pulls up in bed with use of rails and supervision  Transfers Overall transfer level: Needs assistance Equipment used: 1 person hand held assist Transfers: Sit to/from Stand Sit to Stand: Min guard            Ambulation/Gait Ambulation/Gait assistance: Min guard Gait Distance (Feet): 2 Feet Assistive device: 1 person hand held assist Gait Pattern/deviations: Step-to pattern Gait velocity: Decreased Gait velocity interpretation: <1.31 ft/sec, indicative of household ambulator General Gait Details: pt side stepping toward head  of bed   Stairs             Wheelchair Mobility    Modified Rankin (Stroke Patients Only)       Balance Overall balance assessment: Needs assistance Sitting-balance support: No upper extremity supported;Feet supported Sitting balance-Leahy Scale: Good Sitting balance - Comments: supervision of 1 person   Standing balance support: Single extremity supported Standing balance-Leahy Scale: Fair Standing balance comment: hand hold of 1 person, minG                            Cognition Arousal/Alertness: Awake/alert Behavior During Therapy: WFL for tasks assessed/performed Overall Cognitive Status: Within Functional Limits for tasks assessed                                        Exercises      General Comments General comments (skin integrity, edema, etc.): pt on 15L HFNC, desaturating to 84% with standing and transfer, recovering to 93% within 1 minute of laying down and PT cues for breathing in through nose. Pt does report she is often a mouth breather, may be worth attempting canula in mouth      Pertinent Vitals/Pain Pain Assessment: No/denies pain    Home Living                      Prior Function            PT Goals (current goals can now be found in the care plan section) Acute  Rehab PT Goals Patient Stated Goal: to be able to breathe better Progress towards PT goals: Progressing toward goals    Frequency    Min 3X/week      PT Plan Current plan remains appropriate    Co-evaluation              AM-PAC PT "6 Clicks" Mobility   Outcome Measure  Help needed turning from your back to your side while in a flat bed without using bedrails?: None Help needed moving from lying on your back to sitting on the side of a flat bed without using bedrails?: None Help needed moving to and from a bed to a chair (including a wheelchair)?: A Little Help needed standing up from a chair using your arms (e.g., wheelchair  or bedside chair)?: A Little Help needed to walk in hospital room?: A Little Help needed climbing 3-5 steps with a railing? : A Lot 6 Click Score: 19    End of Session Equipment Utilized During Treatment: Oxygen Activity Tolerance: Patient limited by fatigue Patient left: in bed;with call bell/phone within reach Nurse Communication: Mobility status PT Visit Diagnosis: Muscle weakness (generalized) (M62.81);Difficulty in walking, not elsewhere classified (R26.2)     Time: 0102-7253 PT Time Calculation (min) (ACUTE ONLY): 11 min  Charges:  $Therapeutic Activity: 8-22 mins                     Arlyss Gandy, PT, DPT Acute Rehabilitation Pager: [phone]    Arlyss Gandy 03/05/2019, 3:04 PM

## 2019-03-05 NOTE — Care Management Important Message (Signed)
Important Message  Patient Details  Name: Julia Massey MRN: [id number] Date of Birth: April 12, 1951   Medicare Important Message Given:  Yes     [name] 03/05/2019, 12:50 PM

## 2019-03-05 NOTE — Progress Notes (Signed)
CSW acknowledges consult for assisting the patient obtaining an absentee ballot for the upcoming election.   CSW has looked into this and has found the following:    Your county board of elections must receive the completed and signed absentee request form by 5:00 p.m. on Tuesday March 06, 2019.  Voters are only entitled to assistance from their near relative, verifiable legal guardian, or a member of a Administrator, Civil Service (MAT) authorized by the [employer]. Contact the county board of elections to request a MAT.    Assistance for voters who are blind, disabled, or unable to read or write  If a voter cannot complete the absentee request form due to blindness, disability, or inability to read or write, they may receive assistance from a near relative or legal guardian. If a near relative or legal guardian is not available, someone else can give assistance. That person must provide their name and address in Section 7.  Assistance for voters who are patients in a hospital, clinic, nursing home, or adult care home ("facility")  If a voter is a patient in a facility in [place], a member of a Administrator, Civil Service (MAT) can assist the voter.   It is unlawful for any owner, Freight forwarder, Mudlogger or employees of the facility other than the voter's near relative, verifiable legal guardian, or member of a MAT to request an absentee ballot on behalf of a voter.  If neither the voter's near relative or verifiable legal guardian is available, and a MAT is not available within 7 calendar days of a request, the voter may obtain assistance from anyone who is not: An owner, Freight forwarder, Mudlogger, or employee of the facility An elected official, a candidate, or an Regulatory affairs officer in a Information systems manager for a candidate or Optometrist  The deadline for absentee ballots is tomorrow at 5:00pm. CSW will continue to look into this for the patient.    Domenic Schwab, MSW, [place] Work[facility]  [phone]

## 2019-03-06 ENCOUNTER — Inpatient Hospital Stay (HOSPITAL_COMMUNITY): Payer: Medicare HMO

## 2019-03-06 ENCOUNTER — Encounter (HOSPITAL_COMMUNITY): Admission: EM | Disposition: E | Payer: Self-pay | Source: Home / Self Care | Attending: Internal Medicine

## 2019-03-06 DIAGNOSIS — R0609 Other forms of dyspnea: Secondary | ICD-10-CM | POA: Diagnosis not present

## 2019-03-06 DIAGNOSIS — M25512 Pain in left shoulder: Secondary | ICD-10-CM | POA: Diagnosis not present

## 2019-03-06 DIAGNOSIS — I10 Essential (primary) hypertension: Secondary | ICD-10-CM | POA: Diagnosis not present

## 2019-03-06 DIAGNOSIS — M25511 Pain in right shoulder: Secondary | ICD-10-CM | POA: Diagnosis not present

## 2019-03-06 DIAGNOSIS — N200 Calculus of kidney: Secondary | ICD-10-CM | POA: Diagnosis not present

## 2019-03-06 DIAGNOSIS — D649 Anemia, unspecified: Secondary | ICD-10-CM | POA: Diagnosis not present

## 2019-03-06 DIAGNOSIS — Z7189 Other specified counseling: Secondary | ICD-10-CM | POA: Diagnosis not present

## 2019-03-06 DIAGNOSIS — I50810 Right heart failure, unspecified: Secondary | ICD-10-CM | POA: Diagnosis not present

## 2019-03-06 DIAGNOSIS — J849 Interstitial pulmonary disease, unspecified: Secondary | ICD-10-CM | POA: Diagnosis not present

## 2019-03-06 DIAGNOSIS — R0902 Hypoxemia: Secondary | ICD-10-CM

## 2019-03-06 DIAGNOSIS — D62 Acute posthemorrhagic anemia: Secondary | ICD-10-CM | POA: Diagnosis not present

## 2019-03-06 DIAGNOSIS — R531 Weakness: Secondary | ICD-10-CM | POA: Diagnosis not present

## 2019-03-06 DIAGNOSIS — I2510 Atherosclerotic heart disease of native coronary artery without angina pectoris: Secondary | ICD-10-CM | POA: Diagnosis not present

## 2019-03-06 DIAGNOSIS — R799 Abnormal finding of blood chemistry, unspecified: Secondary | ICD-10-CM

## 2019-03-06 DIAGNOSIS — F419 Anxiety disorder, unspecified: Secondary | ICD-10-CM

## 2019-03-06 DIAGNOSIS — R0600 Dyspnea, unspecified: Secondary | ICD-10-CM | POA: Diagnosis not present

## 2019-03-06 DIAGNOSIS — R0603 Acute respiratory distress: Secondary | ICD-10-CM

## 2019-03-06 DIAGNOSIS — J9601 Acute respiratory failure with hypoxia: Secondary | ICD-10-CM | POA: Diagnosis not present

## 2019-03-06 DIAGNOSIS — I272 Pulmonary hypertension, unspecified: Secondary | ICD-10-CM | POA: Diagnosis not present

## 2019-03-06 DIAGNOSIS — Z515 Encounter for palliative care: Secondary | ICD-10-CM | POA: Diagnosis not present

## 2019-03-06 DIAGNOSIS — R69 Illness, unspecified: Secondary | ICD-10-CM | POA: Diagnosis not present

## 2019-03-06 LAB — CBC WITH DIFFERENTIAL/PLATELET
Abs Immature Granulocytes: 0.26 10*3/uL — ABNORMAL HIGH (ref 0.00–0.07)
Abs Immature Granulocytes: 0.26 10*3/uL — ABNORMAL HIGH (ref 0.00–0.07)
Basophils Absolute: 0 10*3/uL (ref 0.0–0.1)
Basophils Absolute: 0 10*3/uL (ref 0.0–0.1)
Basophils Relative: 0 %
Basophils Relative: 0 %
Eosinophils Absolute: 0 10*3/uL (ref 0.0–0.5)
Eosinophils Absolute: 0 10*3/uL (ref 0.0–0.5)
Eosinophils Relative: 0 %
Eosinophils Relative: 0 %
HCT: 33 % — ABNORMAL LOW (ref 36.0–46.0)
HCT: 33.5 % — ABNORMAL LOW (ref 36.0–46.0)
Hemoglobin: 10.7 g/dL — ABNORMAL LOW (ref 12.0–15.0)
Hemoglobin: 11 g/dL — ABNORMAL LOW (ref 12.0–15.0)
Immature Granulocytes: 2 %
Immature Granulocytes: 1 %
Lymphocytes Relative: 3 %
Lymphocytes Relative: 4 %
Lymphs Abs: 0.5 10*3/uL — ABNORMAL LOW (ref 0.7–4.0)
Lymphs Abs: 0.8 10*3/uL (ref 0.7–4.0)
MCH: 30.3 pg (ref 26.0–34.0)
MCH: 30.6 pg (ref 26.0–34.0)
MCHC: 32.4 g/dL (ref 30.0–36.0)
MCHC: 32.8 g/dL (ref 30.0–36.0)
MCV: 93.5 fL (ref 80.0–100.0)
MCV: 93.1 fL (ref 80.0–100.0)
Monocytes Absolute: 0.5 10*3/uL (ref 0.1–1.0)
Monocytes Absolute: 0.6 10*3/uL (ref 0.1–1.0)
Monocytes Relative: 3 %
Monocytes Relative: 3 %
Neutro Abs: 16.5 10*3/uL — ABNORMAL HIGH (ref 1.7–7.7)
Neutro Abs: 16.9 10*3/uL — ABNORMAL HIGH (ref 1.7–7.7)
Neutrophils Relative %: 92 %
Neutrophils Relative %: 92 %
Platelets: 413 10*3/uL — ABNORMAL HIGH (ref 150–400)
Platelets: 420 10*3/uL — ABNORMAL HIGH (ref 150–400)
RBC: 3.53 MIL/uL — ABNORMAL LOW (ref 3.87–5.11)
RBC: 3.6 MIL/uL — ABNORMAL LOW (ref 3.87–5.11)
RDW: 12.3 % (ref 11.5–15.5)
RDW: 12.3 % (ref 11.5–15.5)
WBC: 17.8 10*3/uL — ABNORMAL HIGH (ref 4.0–10.5)
WBC: 18.5 10*3/uL — ABNORMAL HIGH (ref 4.0–10.5)
nRBC: 0 % (ref 0.0–0.2)
nRBC: 0 % (ref 0.0–0.2)

## 2019-03-06 LAB — CBC
HCT: 32.9 % — ABNORMAL LOW (ref 36.0–46.0)
HCT: 31.2 % — ABNORMAL LOW (ref 36.0–46.0)
Hemoglobin: 10.8 g/dL — ABNORMAL LOW (ref 12.0–15.0)
Hemoglobin: 10.6 g/dL — ABNORMAL LOW (ref 12.0–15.0)
MCH: 30.5 pg (ref 26.0–34.0)
MCH: 30.9 pg (ref 26.0–34.0)
MCHC: 32.8 g/dL (ref 30.0–36.0)
MCHC: 34 g/dL (ref 30.0–36.0)
MCV: 92.9 fL (ref 80.0–100.0)
MCV: 91 fL (ref 80.0–100.0)
Platelets: 411 10*3/uL — ABNORMAL HIGH (ref 150–400)
Platelets: 384 10*3/uL (ref 150–400)
RBC: 3.54 MIL/uL — ABNORMAL LOW (ref 3.87–5.11)
RBC: 3.43 MIL/uL — ABNORMAL LOW (ref 3.87–5.11)
RDW: 12.2 % (ref 11.5–15.5)
RDW: 12.5 % (ref 11.5–15.5)
WBC: 18 10*3/uL — ABNORMAL HIGH (ref 4.0–10.5)
WBC: 19.5 10*3/uL — ABNORMAL HIGH (ref 4.0–10.5)
nRBC: 0 % (ref 0.0–0.2)
nRBC: 0 % (ref 0.0–0.2)

## 2019-03-06 LAB — BASIC METABOLIC PANEL
Anion gap: 12 (ref 5–15)
BUN: 59 mg/dL — ABNORMAL HIGH (ref 8–23)
CO2: 22 mmol/L (ref 22–32)
Calcium: 8.6 mg/dL — ABNORMAL LOW (ref 8.9–10.3)
Chloride: 101 mmol/L (ref 98–111)
Creatinine, Ser: 0.52 mg/dL (ref 0.44–1.00)
GFR calc Af Amer: >60 mL/min (ref >60)
GFR calc non Af Amer: >60 mL/min (ref >60)
Glucose, Bld: 244 mg/dL — ABNORMAL HIGH (ref 70–99)
Potassium: 4.2 mmol/L (ref 3.5–5.1)
Sodium: 135 mmol/L (ref 135–145)

## 2019-03-06 LAB — HEPATIC FUNCTION PANEL
ALT: 19 U/L (ref 0–44)
AST: 22 U/L (ref 15–41)
Albumin: 2.7 g/dL — ABNORMAL LOW (ref 3.5–5.0)
Alkaline Phosphatase: 51 U/L (ref 38–126)
Bilirubin, Direct: <0.1 mg/dL (ref 0.0–0.2)
Total Bilirubin: 0.5 mg/dL (ref 0.3–1.2)
Total Protein: 5.3 g/dL — ABNORMAL LOW (ref 6.5–8.1)

## 2019-03-06 LAB — SAVE SMEAR(SSMR), FOR PROVIDER SLIDE REVIEW

## 2019-03-06 LAB — GLUCOSE, CAPILLARY
Glucose-Capillary: 153 mg/dL — ABNORMAL HIGH (ref 70–99)
Glucose-Capillary: 174 mg/dL — ABNORMAL HIGH (ref 70–99)

## 2019-03-06 LAB — LIPID PANEL
Cholesterol: 153 mg/dL (ref 0–200)
HDL: 43 mg/dL (ref >40)
LDL Cholesterol: 82 mg/dL (ref 0–99)
Total CHOL/HDL Ratio: 3.6 RATIO
Triglycerides: 140 mg/dL (ref <150)
VLDL: 28 mg/dL (ref 0–40)

## 2019-03-06 LAB — OCCULT BLOOD X 1 CARD TO LAB, STOOL: Fecal Occult Bld: POSITIVE — AB

## 2019-03-06 LAB — LACTATE DEHYDROGENASE: LDH: 271 U/L — ABNORMAL HIGH (ref 98–192)

## 2019-03-06 SURGERY — CORONARY STENT INTERVENTION
Anesthesia: LOCAL

## 2019-03-06 MED ORDER — POLYETHYLENE GLYCOL 3350 17 G PO PACK: 17.0000 g | PACK | Freq: Once | ORAL | Status: DC

## 2019-03-06 MED ORDER — POLYETHYLENE GLYCOL 3350 17 G PO PACK: 17.0000 g | PACK | Freq: Every day | ORAL | Status: DC | PRN

## 2019-03-06 MED ORDER — PANTOPRAZOLE SODIUM 40 MG IV SOLR
40.0000 mg | INTRAVENOUS | Status: DC
  Filled 2019-03-06: qty 40

## 2019-03-06 MED ORDER — PANTOPRAZOLE SODIUM 40 MG IV SOLR
40.0000 mg | Freq: Two times a day (BID) | INTRAVENOUS | Status: DC
  Administered 2019-03-06 – 2019-03-07 (×2): 40 mg via INTRAVENOUS
  Filled 2019-03-06 (×2): qty 40

## 2019-03-06 NOTE — Progress Notes (Signed)
Weight was obtain via Bed scale. Patient de-sats  At 88%.Bump up O2 to 15%. Patient still in labored breathing and thus complaining shoulder pain. Pain meds given. 02 sating at 95% NOW. Will monitor

## 2019-03-06 NOTE — Progress Notes (Signed)
Patient ID: Julia Massey, female   DOB: 26-Apr-1951, 68 y.o.   MRN: [id number]  This NP visited patient at the bedside as a follow up for palliative medicine needs and emotional support.  Patient tells me she "feels better today" but she remains dyspneic at rest.   Again today the patient verbalizes her anxiety regarding ongoing medical complications and associated decisions.  Today there is discussion with gastroenterology regarding decline in hemoglobin.  EGD for investigation was discussed, decision for now is to medically manage and observation holding off on scope secondary to her tenuous pulmonary situation.  Emotional support offered.  She verbalizes some of her anxieties around family interpersonal situation  Patient and I did discuss the difficult decisions that she continues to face 2/2 to her multiple co-morbidites.  She is open to ongoing conversation with this NP hoping to help her define her GOCs.  Discussed the importance of continued conversation with her family and her  medical providers regarding overall plan of care and treatment options,  ensuring decisions are within the context of the patients values and GOCs.  Questions and concerns addressed   Total time spent on the unit was 25 minutes, discussed with bedside RN  Greater than 50% of the time was spent in counseling and coordination of care  Wadie Lessen NP  Palliative Medicine Team Team Phone # [phone] Pager [phone]

## 2019-03-06 NOTE — Progress Notes (Signed)
NAME:  Julia Massey, MRN:  [id number], DOB:  May 17, 1950, LOS: 16 ADMISSION DATE:  03/29/2019, CONSULTATION DATE:  02/19/19 REFERRING MD:  Daron Offer IMTS, CHIEF COMPLAINT:  Acute on chronic respiratory failure  Brief History   68 year old with COPD, diastolic heart failure presenting with acute hypoxic respiratory failure on high flow nasal cannula Work-up consists of connective tissue serology showing elevated rheumatoid factor.  She had been told as an outpatient that she may have rheumatoid arthritis versus gout She is an ex-smoker, quit in 2017.  Used to work as a Air cabin crew.  No known exposures with no birds or feather pillows or comforters or mold exposure  Past Medical History  COPD Carpal Tunnel Chronic back pain Gout HLD Diastolic heart dysfunction   Significant Hospital Events   10/11 admitted  10/12 PCCM consulted.  10/21 - says not better. On 10LNC at rest and as she talks pulse ox is 88%. Says pre-admit was not on o2 but doing well. Says brother in [place] who is 6 years older Brooke Bonito) also diagnosed with pulmonary fibrosis in feb 2020. She wants me to call him. Chart review shows RV strain + but she does not have dema.  Chart review shows essentially negative serology  (RF is < 2x ULN). HRCT shows UIP wiuth GGO - pretty c/w UIP flare  10/22  - Relatively normal RHC and no rv failure. 80% LAD stenosis. Start solumedrol 125mg  IV q6h. Started higher dose IV steroids (Solumedrol 125 q6 hrs)  10/23 -  s/p cardiac MRI. Results pending. Cards prefers to see course with steroids and results of cardiac MRI before deciding on stent.   10/27 - plan for LHC today, but had 4gm drop in hgb so procedure on hold.   Consults:  PCCM  Procedures:   Significant Diagnostic Tests:  10/12 CT angio chest> No PE. enlarged heart, no pericardial effusion. Diffuse ground-glass appearance throughout lungs. No Pleural Effusion. Emphysematous changes.   10/19 CT  high-resolution> Basilar predominant fibrosis honeycombing.  Definite UIP pattern. Reviewed images personally.  10/27 CT abdomen (in response to HGB drop in setting of recent cath)  Micro Data:  10/11 SARS CoV2> neg  10/12 RVP neg  Antimicrobials:  Azithroymycin 10/11-10/13 Rocephin 10/11-10/13  Interim history/subjective:  Feeling worse today after a drop in sats last night, which she feels yet to recover from. 4gm drop in hemoglobin overnight.   Objective   Blood pressure 118/74, pulse (!) 105, temperature 97.7 F (36.5 C), temperature source Oral, resp. rate (!) 22, height 5\' 6"  (1.676 m), weight 46.9 kg, SpO2 98 %.        Intake/Output Summary (Last 24 hours) at 02/22/2019 0842 Last data filed at 03/09/2019 0600 Gross per 24 hour  Intake 301.64 ml  Output 1400 ml  Net -1098.36 ml   Filed Weights   03/04/19 0717 03/05/19 0625 02/08/2019 0411  Weight: 45.4 kg 45.7 kg 46.9 kg   Gen:      Frail elderly female in mild/moderate distress Lungs:    Bilateral rales CV:         RRR, no MRG Abd:      Soft, non-tender Ext:    No acute deformity or ROM limitation Skin:      Warm and dry; no rash Neuro:    Alert, oriented, non-focal  Resolved Hospital Problem list     Assessment & Plan:   Acute on chronic respiratory failure with hypoxemia:  CT findings consistent with UIP, IPF. She  has history of RA as well, but serum RF not consistent with rheumatoid lung. May be a small component of CHF contributing. Today (10/27) acute decline may be in the setting of anemia as her reserves are quite low.   Plan Continue steroids: prednisone 1mg /kg Continue to wean oxygen for SpO2 goal 88-95% (12L [place] HFNC currently) Not a great candidate for PT/OT mobilization today given acute decompensation.  No lung biopsy needed for diagnosis so OK to DAPT/anticoagulate as seen fit by cardiology. (obviously on hold now given anemia) Anemia workup per primary Long term prognosis is poor,  palliative is following. Patient not prepared to make any decisions regarding GOC at this time.    PCCM will continue to follow.   Georgann Housekeeper, AGACNP-BC [place] Pager [phone] or [phone]  02/26/2019 8:43 AM

## 2019-03-06 NOTE — Progress Notes (Signed)
   Hgb dropped 4 points overnight. No clear source.   PCI cancelled. I d/w Dr. Dareen Piano.  Would treat CAD medically for now until more stable. LAD lesion is significant but not critical so medical therapy for now is very reasonable.   Glori Bickers, MD  8:45 AM

## 2019-03-06 NOTE — Consult Note (Signed)
[facility] Consult: 12:20 PM 03/07/2019  LOS: 16 days    Referring Provider: Dr Heide Spark, TS  Primary Care Physician:  Courtney Paris, NP [facility] Primary Gastroenterologist: Dr. Sharrell Ku    Reason for Consultation:  anemia   HPI: Julia Massey is a 68 y.o. female.  Hx pulmonary fibrosis.  OSA.  CHF.  Anxiety.  Chronic pain.   Has had upper endoscopy and colonoscopy with Dr. Kinnie Scales but it has been 10 to 20 years ago.   11/2015 barium esophagram revealed findings suggestive of benign peptic stricture just proximal to the EG junction and moderate GER.     2 weeks plus inpt admission for acute respiratory failure.  On high dose Solumedrol, 125 mg q 6 hours for pulmonary fibrosis after failing Prednisone.  Anxiety also contributing to dyspnea. Cardiac cath 10/22: 80% LAD dz, today's planned stenting cancelled due to Hgb decline. Hgb in 17 to 15.6 range.  Began decline on 10/25, 10.7 yesterday, 10.8 today.   BUN 18 >> 59 in last 24 hours with stable/normal creatinine.  CTAP wo contrast today shows large amount of stool within the nonobstructed colon, no findings to explain anemia.  Normal liver. Lovenox, ASA, Plavix on hold as of today, received doses of latter two this AM.    No reports of melena/BPR, CGE/bloody emesis.      Protonix 40 IV/bid ordered today, not dosed yet.  No PPI, H2 blocker previously.    At home patient suffers from dysphagia to solid foods.  This has been a problem ever since she had a prolonged admission and prolonged intubation/ventilation in 2017.  Occasionally she will have to bend over and food gets gotten captured in her esophagus will regurgitate back up.  This situation is stable.  At home and here in the hospital her appetite is good.  Denies abdominal pain.   Stools have not been reported as melenic or bloody blood stool sent today to the lab is FOBT positive.  Although she has been told she has hemorrhoids, they do not bleed.  No previous blood transfusions.  No unusual bleeding or bruising.  Takes Advil occasionally, does not use PPI or H2 blocker at home.  Prior to arrival weight was stable.  Since hospitalization weight has dropped 11 #.  Past Medical History:  Diagnosis Date   ADHD    CHF (congestive heart failure) (HCC)    Chronic pain    COPD (chronic obstructive pulmonary disease) (HCC)    COPD exacerbation (HCC)    GERD (gastroesophageal reflux disease)    Hypercholesterolemia    Sleep apnea     Past Surgical History:  Procedure Laterality Date   RIGHT/LEFT HEART CATH AND CORONARY ANGIOGRAPHY N/A 03/03/2019   Procedure: RIGHT/LEFT HEART CATH AND CORONARY ANGIOGRAPHY;  Surgeon: Dolores Patty, MD;  Location: [facility];  Service: Cardiovascular;  Laterality: N/A;    Prior to Admission medications   Medication Sig Start Date End Date Taking? Authorizing Provider  amphetamine-dextroamphetamine (ADDERALL) 30 MG tablet Take 30 mg by mouth daily.  Yes [provider]  cetirizine (ZYRTEC) 10 MG tablet Take 10 mg by mouth daily as needed for allergies. Reported on 06/18/2015   Yes [provider]  Cyanocobalamin ([employer] B-12) 1000 MCG/15ML LIQD Take 1,000 mcg by mouth daily.    Yes [provider]  FLUoxetine (PROZAC) 40 MG capsule Take 1 capsule (40 mg total) by mouth daily. 06/12/15  Yes Angiulli, Mcarthur Rossettianiel J, PA-C  folic acid (FOLVITE) 1 MG tablet Take 1 tablet (1 mg total) by mouth daily. 06/12/15  Yes Angiulli, Mcarthur Rossettianiel J, PA-C  HYDROcodone-acetaminophen (NORCO) 10-325 MG tablet Take 1 tablet by mouth every 6 (six) hours as needed for moderate pain.   Yes [provider]  ibuprofen (ADVIL) 200 MG tablet Take 400 mg by mouth every 6 (six) hours as needed for moderate pain.   Yes [provider]  levofloxacin (LEVAQUIN) 500 MG tablet Take 500 mg by mouth daily.   Yes [provider]  naproxen sodium (ALEVE) 220 MG tablet Take 220 mg by mouth as needed (pain).   Yes [provider]  polyethylene glycol (MIRALAX / GLYCOLAX) packet Take 17 g by mouth daily. Patient not taking: Reported on 06/18/2015 06/12/15   Charlton AmorAngiulli, Daniel J, PA-C    Scheduled Meds:  atorvastatin  80 mg Oral q1800   FLUoxetine  40 mg Oral Daily   influenza vaccine adjuvanted  0.5 mL Intramuscular Tomorrow-1000   ipratropium-albuterol  3 mL Nebulization TID   mouth rinse  15 mL Mouth Rinse q12n4p   methylPREDNISolone (SOLU-MEDROL) injection  125 mg Intravenous Q6H   pantoprazole (PROTONIX) IV  40 mg Intravenous Q12H   sodium chloride flush  3 mL Intravenous Q12H   sodium chloride flush  3 mL Intravenous Q12H   Infusions:  sodium chloride     sodium chloride     sodium chloride 10 mL/hr at 02/25/2019 [id number]   PRN Meds: sodium chloride, sodium chloride, [DISCONTINUED] acetaminophen **OR** acetaminophen, acetaminophen, albuterol, calcium carbonate, hydrALAZINE, HYDROcodone-acetaminophen, ondansetron (ZOFRAN) IV, polyethylene glycol, senna-docusate, sodium chloride, sodium chloride flush, sodium chloride flush   Allergies as of 03-04-19   (No Known Allergies)    History reviewed. No pertinent family history.  Social History   Socioeconomic History   Marital status: Married    Spouse name: Not on file   Number of children: Not on file   Years of education: Not on file   Highest education level: Not on file  Occupational History   Not on file  Social Needs   Financial resource strain: Not on file   Food insecurity    Worry: Not on file    Inability: Not on file   Transportation needs    Medical: Not on file    Non-medical: Not on file  Tobacco Use   Smoking status: Former Smoker    Packs/day: 0.25    Years: 15.00    Pack years: 3.75    Types:  Cigarettes    Quit date: 04/10/2015    Years since quitting: 3.9   Smokeless tobacco: Never Used  Substance and Sexual Activity   Alcohol use: Yes    Alcohol/week: 4.0 standard drinks    Types: 2 Glasses of wine, 2 Cans of beer per week   Drug use: No   Sexual activity: Not on file  Lifestyle   Physical activity    Days per week: Not on file    Minutes per session: Not on file   Stress: Not on file  Relationships  Social Herbalist on phone: Not on file    Gets together: Not on file    Attends religious service: Not on file    Active member of club or organization: Not on file    Attends meetings of clubs or organizations: Not on file    Relationship status: Not on file   Intimate partner violence    Fear of current or ex partner: Not on file    Emotionally abused: Not on file    Physically abused: Not on file    Forced sexual activity: Not on file  Other Topics Concern   Not on file  Social History Narrative   Not on file    REVIEW OF SYSTEMS: Constitutional: Some fatigue and weakness but feeling stronger than a few days ago ENT:  No nose bleeds Pulm: Breathing had improved but last night there was acute worsening of her breathing. CV:  No palpitations, no LE edema.  GU:  No hematuria, no frequency GI: see HPI Heme: See HPI Transfusions: See HPI Neuro:  No headaches, no peripheral tingling or numbness.  No dizziness, no syncope. Derm:  No itching, no rash or sores.  Endocrine:  No sweats or chills.  No polyuria or dysuria Immunization: Not queried.  No record of her current or past immunizations Travel:  None beyond local counties in last few months.    PHYSICAL EXAM: Vital signs in last 24 hours: Vitals:   02/13/2019 0850 02/16/2019 1149  BP: 111/72 (!) 136/94  Pulse: (!) 109 (!) 106  Resp: 20 20  Temp: (!) 97.2 F (36.2 C) (!) 97.4 F (36.3 C)  SpO2: 94% 100%   Wt Readings from Last 3 Encounters:  02/23/2019 46.9 kg  06/11/15 45.9 kg    05/29/15 47.5 kg    General: Somewhat frail, anxious, pleasant, cachectic WF.  Alert Head: No facial asymmetry or swelling.  No signs head trauma. Eyes: No conjunctival pallor Ears: Not hard of hearing Nose: No congestion or discharge Mouth: Edentulous, tongue midline.  Mucosa pink, moist, clear. Neck: No JVD, no masses, no thyromegaly Lungs: Dry crackles at least halfway up on both sides.  Some dyspnea.  No coughing Heart: RRR.  No MRG.  S1, S2 present Abdomen: Soft.  Not tender.  Not distended.  No HSM, masses, bruits, hernias..   Rectal: Stool is formed, brown and 2 to 3+ FOBT positive.  No palpable masses Musc/Skeltl: No joint redness, swelling or significant deformity Extremities: No CCE. Neurologic: Oriented x3.  Fully alert.  Good historian.  Moves all 4 limbs without tremor.  Strength not tested. Skin: No telangiectasia, no rash, no sores Nodes: No cervical adenopathy Psych: Somewhat anxious, cooperative, pleasant  Intake/Output from previous day: 10/26 0701 - 10/27 0700 In: 301.6 [P.O.:240; I.V.:61.6] Out: 1400 [Urine:1400] Intake/Output this shift: Total I/O In: 240 [P.O.:240] Out: -   LAB RESULTS: Recent Labs    03/05/19 0614 02/08/2019 0453 02/24/2019 0754  WBC 18.3* 17.8* 18.0*  HGB 14.2 10.7* 10.8*  HCT 43.2 33.0* 32.9*  PLT 415* 413* 411*   BMET Lab Results  Component Value Date   NA 135 03/09/2019   NA 137 03/05/2019   NA 134 (L) 03/04/2019   K 4.2 03/08/2019   K 4.2 03/05/2019   K 4.2 03/04/2019   CL 101 03/04/2019   CL 100 03/05/2019   CL 99 03/04/2019   CO2 22 02/12/2019   CO2 26 03/05/2019   CO2 25 03/04/2019   GLUCOSE 244 (  H) 03/10/2019   GLUCOSE 114 (H) 03/05/2019   GLUCOSE 138 (H) 03/04/2019   BUN 59 (H) 03/09/2019   BUN 18 03/05/2019   BUN 18 03/04/2019   CREATININE 0.52 02/24/2019   CREATININE 0.47 03/05/2019   CREATININE 0.53 03/04/2019   CALCIUM 8.6 (L) 02/17/2019   CALCIUM 9.2 03/05/2019   CALCIUM 9.1 03/04/2019    LFT Recent Labs    02/28/2019 0754  PROT 5.3*  ALBUMIN 2.7*  AST 22  ALT 19  ALKPHOS 51  BILITOT 0.5  BILIDIR <0.1  IBILI NOT CALCULATED   PT/INR Lab Results  Component Value Date   INR 1.31 05/15/2015   Hepatitis Panel No results for input(s): HEPBSAG, HCVAB, HEPAIGM, HEPBIGM in the last 72 hours. C-Diff No components found for: CDIFF Lipase  No results found for: LIPASE  Drugs of Abuse     Component Value Date/Time   LABOPIA POSITIVE (A) 05/14/2015 0348   COCAINSCRNUR NONE DETECTED 05/14/2015 0348   LABBENZ NONE DETECTED 05/14/2015 0348   AMPHETMU POSITIVE (A) 05/14/2015 0348   THCU NONE DETECTED 05/14/2015 0348   LABBARB NONE DETECTED 05/14/2015 0348     RADIOLOGY STUDIES: Ct Abdomen Pelvis Wo Contrast  Result Date: 02/16/2019 CLINICAL DATA:  68 year old female with concern for acute blood loss anemia. EXAM: CT ABDOMEN AND PELVIS WITHOUT CONTRAST TECHNIQUE: Multidetector CT imaging of the abdomen and pelvis was performed following the standard protocol without IV contrast. COMPARISON:  CT chest 02/26/2019 FINDINGS: Lower chest: No acute finding of the lower chest. Redemonstration of bronchiectasis, architectural distortion, an reticulation. No pleural effusion. Hepatobiliary: Unremarkable liver. Gallbladder somewhat distended though with no inflammatory changes or pericholecystic fluid. No radiopaque stones. Unremarkable intrahepatic and extrahepatic biliary system. Pancreas: Unremarkable Spleen: Unremarkable Adrenals/Urinary Tract: Unremarkable appearance of the adrenal glands. No evidence of hydronephrosis of the right or left kidney. No right nephrolithiasis. Punctate calcifications in the inferior collecting system of the left kidney. Unremarkable course of the bilateral ureters. Unremarkable appearance of the urinary bladder. Stomach/Bowel: Small hiatal hernia. Unremarkable stomach. Unremarkable small bowel. Normal appendix. No fluid level within the colon. Large  formed stool burden including formed stool in the rectum. Vascular/Lymphatic: Atherosclerotic changes of the aorta. No adenopathy. Reproductive: Unremarkable pelvic structures. Other: No hernia. Musculoskeletal: No acute displaced fracture of the visualized thoracolumbar spine. Superior endplate changes of T11, similar to the comparison CT. Vacuum disc phenomenon at L5-S1 with disc space narrowing and endplate changes. IMPRESSION: No acute CT finding, specifically with no evidence of hematoma. Large formed stool burden throughout the colon without evidence of obstruction, potentially representing constipation. Nonobstructive left-sided nephrolithiasis. Aortic Atherosclerosis (ICD10-I70.0). Additional ancillary findings as above. Electronically Signed   By: Gilmer Mor D.O.   On: 02/09/2019 10:00     IMPRESSION:   *    Normocytic anemia.  ~5 g decline in Hgb over the last few days. FOBT positive but no overt melena, hematochezia.  No significant acute GI symptoms but does suffer from chronic solid food dysphagia. Esophagram in 2017 raised question of distal esophageal stricture  *   Acute respiratory failure, pulmonary fibrosis/interstitial lung disease. Currently receiving high-dose Solu-Medrol for management.  *   80% LAD lesion per cath 10/22.  Plavix added, last dose today, now on hold.  Planned stenting today cxld due to anemia.  Also right heart failure/pulm htn.     PLAN:     *   Suggest EGD as first step in working up the anemia as well as investigating dysphagia.  Respiratory  status as well as Plavix are rate limiting in terms of timing for the procedure. Dr Judie PetitM will see patient. In the meantime continue the Protonix 40 mg IV bid and run serial checks of her Hb.  *  Ok for resuming meals, will limit her to full liquids   Jennye MoccasinSarah Halle Davlin  03/08/2019, 12:20 PM Phone [phone]

## 2019-03-06 NOTE — Progress Notes (Addendum)
Advanced Heart Failure Rounding Note  PCP-Cardiologist: Dr. Sung Amabile   Subjective:    68 y/o F w/ h/o COPD and fibromyalgia. Admitted in 2017 for respiratory failure and had prolonged course with VDRF. Subsequently recovered well and was able to be quite active and independent. Gave up smoking. Admitted with about 1 week of progressive SOB, chest tightness and weakness. Found to have hypoxic respiratory failure. CT negative for PE but suggestive of PAH. Echo EF 60% with severe RV dilation and dysfunction. Hi-res chest CT c/w UIP. Started on steroids.   Diagnostic R/LHC 10/22 with no significant PAH. 80% mLAD lesion - staged intervention planned but delayed due to anemia. See below.   cMRI 03/02/19 1.  Normal LV size and function small LV cavity EF 69% 2. RV elongated 75 mm with hypokinesis of the RV base and dyskinetic segment at apex RVEF 39% with unusual morphology 3.  No delayed gadolinium uptake in RV/LV or pericardium 4.  Normal AV,MV, TV 5.  No pericardial effusion 6.  Normal aortic root 3.5 cm  More SOB this AM but when examined pt did not have [place] properly placed in nose. O2 sats were 87%. [place] reposition and O2 sats improved to 93%. On high flow, 14L/min. Had an episode of left scapular pain overnight but currently pain free. Pt was scheduled for LAD PCI today but procedure canceled by primary team due to abnormal CBC showing drop in Hgb from 14>>10.8. Rt femoral cath site from 10/23 appears stable. Denies flank/ LBP. BP stable.   Objective:   Weight Range: 46.9 kg Body mass index is 16.69 kg/m.   Vital Signs:   Temp:  [97.7 F (36.5 C)-97.9 F (36.6 C)] 97.7 F (36.5 C) (10/27 0011) Pulse Rate:  [95-107] 105 (10/27 0411) Resp:  [18-24] 22 (10/27 0411) BP: (118-127)/(74-91) 118/74 (10/27 0411) SpO2:  [94 %-98 %] 98 % (10/27 0411) Weight:  [46.9 kg] 46.9 kg (10/27 0411) Last BM Date: 03/03/19  Weight change: Filed Weights   03/04/19 0717 03/05/19 0625 03-26-2019  0411  Weight: 45.4 kg 45.7 kg 46.9 kg    Intake/Output:   Intake/Output Summary (Last 24 hours) at 03-26-2019 0825 Last data filed at 26-Mar-2019 0600 Gross per 24 hour  Intake 301.64 ml  Output 1400 ml  Net -1098.36 ml      Physical Exam    PHYSICAL EXAM: General:  Thin appearing WF, mild respiratory distress w/ increased WOB, improved after [place] reposition. O2 sats 93%.   HEENT: normal Neck: supple. no JVD. Carotids 2+ bilat; no bruits. No lymphadenopathy or thyromegaly appreciated. Cor: PMI nondisplaced. Regular rate & rhythm. No rubs, gallops or murmurs. Lungs: bilateral anterior crackles, c/w ILD Abdomen: soft, nontender, nondistended. No hepatosplenomegaly. No bruits or masses. Good bowel sounds. Extremities: no cyanosis, clubbing, rash, edema Neuro: alert & oriented x 3, cranial nerves grossly intact. moves all 4 extremities w/o difficulty. Affect pleasant.    Telemetry  NSR 80-90s personally reviewed.   EKG    N/A   Labs    CBC Recent Labs    03/05/19 0614 2019-03-26 0453  WBC 18.3* 17.8*  NEUTROABS 16.8* 16.5*  HGB 14.2 10.7*  HCT 43.2 33.0*  MCV 93.1 93.5  PLT 415* 381*   Basic Metabolic Panel Recent Labs    03/05/19 0614 2019-03-26 0453  NA 137 135  K 4.2 4.2  CL 100 101  CO2 26 22  GLUCOSE 114* 244*  BUN 18 59*  CREATININE 0.47 0.52  CALCIUM 9.2  8.6*   Liver Function Tests No results for input(s): AST, ALT, ALKPHOS, BILITOT, PROT, ALBUMIN in the last 72 hours. No results for input(s): LIPASE, AMYLASE in the last 72 hours. Cardiac Enzymes No results for input(s): CKTOTAL, CKMB, CKMBINDEX, TROPONINI in the last 72 hours.  BNP: BNP (last 3 results) Recent Labs    2019/03/20 1203  BNP 1,175.2*    ProBNP (last 3 results) No results for input(s): PROBNP in the last 8760 hours.   D-Dimer No results for input(s): DDIMER in the last 72 hours. Hemoglobin A1C No results for input(s): HGBA1C in the last 72 hours. Fasting Lipid Panel Recent  Labs    02/08/2019 0453  CHOL 153  HDL 43  LDLCALC 82  TRIG 140  CHOLHDL 3.6   Thyroid Function Tests No results for input(s): TSH, T4TOTAL, T3FREE, THYROIDAB in the last 72 hours.  Invalid input(s): FREET3  Other results:   Imaging    No results found.   Medications:     Scheduled Medications:  aspirin  81 mg Oral Daily   atorvastatin  80 mg Oral q1800   clopidogrel  75 mg Oral Q breakfast   enoxaparin (LOVENOX) injection  40 mg Subcutaneous Q24H   FLUoxetine  40 mg Oral Daily   influenza vaccine adjuvanted  0.5 mL Intramuscular Tomorrow-1000   ipratropium-albuterol  3 mL Nebulization TID   mouth rinse  15 mL Mouth Rinse q12n4p   methylPREDNISolone (SOLU-MEDROL) injection  125 mg Intravenous Q6H   pantoprazole (PROTONIX) IV  40 mg Intravenous Q24H   sodium chloride flush  3 mL Intravenous Q12H   sodium chloride flush  3 mL Intravenous Q12H    Infusions:  sodium chloride     sodium chloride     sodium chloride 10 mL/hr at 03/05/2019 0637    PRN Medications: sodium chloride, sodium chloride, [DISCONTINUED] acetaminophen **OR** acetaminophen, acetaminophen, albuterol, calcium carbonate, hydrALAZINE, HYDROcodone-acetaminophen, ondansetron (ZOFRAN) IV, senna-docusate, sodium chloride, sodium chloride flush, sodium chloride flush   Assessment/Plan    1 Acute on chronic hypoxic respiratory failure - has underlying COPD with prolonged intubation several years ago but got back to a very functional baseline - w/u now most c/w UIP and PH - CT negative for PE - Pulmonary managing - continue prednisone and nebs  2. Abnormal RV - RHC without evidence of significant PAH - cMRI with mildly decreased RV function and unusual RV morphology of un certain etiology. Will review with imaging team   3. CAD with Exertional chest tightness - diagnostic cath with 80% LAD.  - plan was for staged PCI today w/ Dr. Eldridge Dace, however procedure cancled due to drop in  Hgb from 14>>10. She is on DAPT +statin. Will NOT be able to stop DAPT for 6 months after stent placement, thus if need for lung biopsy, will need to delay until after 6 months -Will reschedule cath post anemia w/u   4. HLD: LDL 82. Goal given CAD < 70 mg/dL.  -started on atorvo 80 mg nightly -will need repeat FLP and HFTs in 6-8 weeks.   5. Anemia: drop in Hgb in 24 hrs from 14.2>>10.7. CBC repeated and Hgb 10.8. No obvious source of bleeding. Had diagnostic [facility] 10/23 via the right femoral artery. Cath access site appears stable. No ecchymosis or hematoma. Denies flank/LBP. Hemodynamically stable. No hypotension. Currently on DAPT w/ ASA and Plavix. Needs GI w/u prior to LAD intervention to r/o source of bleed, prior to committing pt to long term DAPT.  -w/u per  primary team -monitor H/H closely    Length of Stay: [address], PA-C  03/05/2019, 8:25 AM  Advanced Heart Failure Team Pager [phone] (M-F; 7a - 4p)  Please contact [facility] for night-coverage after hours (4p -7a ) and weekends on [email or web address]   Patient seen and examined with the above-signed Advanced Practice Provider and/or Housestaff. I personally reviewed laboratory data, imaging studies and relevant notes. I independently examined the patient and formulated the important aspects of the plan. I have edited the note to reflect any of my changes or salient points. I have personally discussed the plan with the patient and/or family.  Remains on high-flow O2 (12L).   Hgb dropped 4 points overnight. No clear source.   PCI cancelled. I d/w Dr. Heide Spark.  Would treat CAD medically for now until more stable. LAD lesion is significant but not critical so medical therapy for now is very reasonable particularly light of co-morbidities and lack of high-grade ischemic symptoms. CAN hold ASA/Plavix for now.  Arvilla Meres, MD  8:45 AM

## 2019-03-06 NOTE — Plan of Care (Signed)
  Problem: Education: Goal: Knowledge of General Education information will improve Description: Including pain rating scale, medication(s)/side effects and non-pharmacologic comfort measures Outcome: Progressing   Problem: Health Behavior/Discharge Planning: Goal: Ability to manage health-related needs will improve Outcome: Progressing   Problem: Clinical Measurements: Goal: Ability to maintain clinical measurements within normal limits will improve Outcome: Progressing Goal: Will remain free from infection Outcome: Progressing Goal: Diagnostic test results will improve Outcome: Progressing Goal: Respiratory complications will improve Outcome: Progressing Note: Patient would sat around 85-87% during transport or movement so we talked about taking deep breathes before any type of movement to ensure she has enough oxygen moving around in her bed or in the room. Goal: Cardiovascular complication will be avoided Outcome: Progressing   Problem: Activity: Goal: Risk for activity intolerance will decrease Outcome: Progressing   Problem: Coping: Goal: Level of anxiety will decrease Outcome: Progressing   Problem: Pain Managment: Goal: General experience of comfort will improve Outcome: Progressing   Problem: Safety: Goal: Ability to remain free from injury will improve Outcome: Progressing

## 2019-03-06 NOTE — Progress Notes (Signed)
Internal med DR called my attention, pt Hemoglobin dropped 4 points. Cardiac cath, Bright made aware and he stated to skip the procedure for now.  Internal med wants to collect stool for hemoccult. CBC to recheck. Will pass it on days shift

## 2019-03-06 NOTE — Progress Notes (Signed)
Internal Medicine Attending:   I saw and examined the patient. I reviewed the resident's note and I agree with the resident's findings and plan as documented in the resident's note.  Patient continues to complain of some anxiety today and states that she had shoulder pain for which she was given Norco which helped her sleep.  Today, she was noted to have decreased hemoglobin down to 10.7 from 14.2.  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  Pulmonary follow-up and recommendations appreciated.  We will continue with steroids at current dose for now and transition patient to oral steroids tomorrow.  Patient had increased oxygen requirements today and was up to 15 L nasal cannula.  We will attempt to wean her down from this.  Continue with duo nebs as needed.  Patient with new anemia today likely secondary from acute blood loss.  The etiology behind her anemia remains uncertain at this time.  CT abdomen pelvis ruled out an RP bleed and she has no evidence for hemolysis on her labs currently.  She is mildly tachycardic in the 100s which could be secondary to her anxiety but may also be from acute blood loss anemia.  Her vitals otherwise remained stable.  We will continue to monitor closely.  Will repeat CBC later this afternoon.  Patient stool was positive for occult blood.  GI consult and recommendations appreciated.  She will need an EGD to rule out a GI bleed.  Continue with PPI for now.  Hold Lovenox, aspirin and Plavix.  Patient was scheduled to have an angiogram with stent placement in the LAD for 80% stenosis.  However, given her new onset anemia we will hold off on this for now.  I discussed this with Dr. Haroldine Laws who is in agreement with plan.  Patient's early lesion is significant but not critical and medical therapy is reasonable at this time.

## 2019-03-06 NOTE — Progress Notes (Signed)
Subjective:    [name] states she continues to feel anxious overnight and did not have a good night. She was able to sleep some after taking Norco for her bilateral shoulder pain that was giving her anxiety. Her shoulder pain is new and she denies having it beforehand but the Norco did relieve it completely. She notes that her oxygen levels had to be increased due to worsening SOB. She had some abdominal cramping that was relieved by a BM very early this morning. She is unsure what the stool looked like but that it was small amount. She denies any nausea, vomiting, recurrence of abdominal pain, urinary changes, headache, dizziness, back pain that is changed from her chronic back pain.   She is aware of drop in hemoglobin and we discussed the additional tests we will be doing to evaluate. We discussed waiting on stent for now.   Objective:  Vital signs in last 24 hours: Vitals:   Mar 24, 2019 0011 24-Mar-2019 0100 Mar 24, 2019 0411 24-Mar-2019 0841  BP: 118/74  118/74   Pulse: (!) 107  (!) 105   Resp: 20  (!) 22   Temp: 97.7 F (36.5 C)     TempSrc: Oral     SpO2: 97% 98% 98% 100%  Weight:   46.9 kg   Height:        Physical Exam Vitals signs and nursing note reviewed.  Constitutional:      General: She is not in acute distress.    Appearance: She is well-developed. She is not toxic-appearing.  Cardiovascular:     Rate and Rhythm: Regular rhythm. Tachycardia present.     Heart sounds: No murmur.  Pulmonary:     Effort: Tachypnea and accessory muscle usage present. No respiratory distress.     Breath sounds: Rales (bilateral in all lung fields, most severe bibasilar. Dry in nature, unchanged from previous examination) present. No decreased breath sounds, wheezing or rhonchi.  Abdominal:     General: Bowel sounds are normal.     Palpations: Abdomen is soft.     Tenderness: There is no abdominal tenderness. There is no guarding.  Musculoskeletal:     Comments: No flank tenderness  Skin:     General: Skin is warm and dry.     Findings: No ecchymosis or rash.     Comments: No flank hematoma. No hematoma surrounding right groin cath site.   Neurological:     General: No focal deficit present.     Mental Status: She is alert and oriented to person, place, and time.  Psychiatric:        Mood and Affect: Mood is anxious.        Behavior: Behavior normal.    Assessment/Plan:  Active Problems:   DNR (do not resuscitate) discussion   Acute respiratory failure with hypoxemia (HCC)   Palliative care by specialist   Weakness generalized   Dyspnea  # Acute hypoxic respiratory failure  CTA on 10/12 was notable for bibasilar honeycombing, diffuse ground glass opacities, enlarged right ventricle and pulmonary arteries consistent with pulmonary artery hypertension and chronic changes consistent with interstitial lung disease. PCCM was consulted and noted possible ILD. Rheumatologic workup notable for elevated sed rate and positive RF, rest of serology were negative including CCP. High-resolution CT on 10/19 showed significant ground glass opacities and findings consistent with UIP with possible overlying edema vs UIP flare. 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 Despite the lack of mortality benefit and possible harm, patient will  require steroids to maintain her pulmonary function. Due to lack of adequate improvement with Prednisone, she has been switched to to high dose Solumedrol.  HFNC was increased overnight due to acute worsening in SOB especially with movement, which may be exacerbated by her anxiousness. Respiratory therapy was able to decrease back down to 12 from 15. Saturation maintained in the 90s during our discussion. Will wait to wean steroids for now, perhaps tomorrow.   - PCCM on board and we appreciate their recommendations - Telemetry - HFNC, maintain saturation above >80% - Wean as tolerated - Duoneb TID  - Solumedrol 125mg  QID  # Acute  anemia, possibly symptomatic  Hemoglobin dropped 4 points in the past 24 hours from 14.2 to 10.7. CBC repeated to verify with hemoglobim of 10.8. Given her hx of recent cath, will order a CT abdomen/pelvix without contrast to rule out retroperitoneal bleed although her physical examination is negative. In addition, will begin a hemolysis workup with Hepatic panel to assess bilirubin, haptoglobin and LDH. With several days of high dose steroids, not on PPI previously, concerned for GI bleed. Will check fecal hemoccult today as well. Repeat CBC in the early afternoon to check if hemoglobin continues to drop.   If tachycardia should worsen or BP decreases further, will consider transfusion. Currently, her tachycardia may be secondary to anxiety. Additionally, if hemoglobin continues to drop this afternoon, will also consider transfusion.    - Hepatic Panel - LDH - Haptoglobin - Save smear - CT abdomen/pelvis WO contrast - Fecal hemoccult  - CBC at 1300 today - Protonix 40mg  IV QD - Hold Lovenox, Aspirin and Plavix for now  # Pulmonary Hypertension # Right Heart Failure  Last echo in 2017 showed grade 1 diastolic dysfunction with preserved EF at 55-60%. BNP on this admission was 1170. TTE on 10/12 showed right ventricle has severely reduced systolic function with moderate pulmonary hypertension. Repeat TTE on 10/19 showed continued right heart failure but not as severe as prior. Heart cath on 10/22 did not show significant pulmonary htn or right heart strain. cMRI showed some hypokinesis at the RV base with dyskinesis at RV apex. Dr. Haroldine Laws stated in his note he will follow up these results with the imaging team as it is unusual morphology. No additional intervention required at this time, will continue to monitor. Stable.   - HF is on board and we appreciate their recommendations  - Telemetry - Strict I/Os  - Daily weights  # LAD Stenosis:  Coronary angio on 10/22 showed a 80% stenosis of  the LAD. Cardiology recommends PCI. Scheduled for today but cancelled due to acute drop in hemoglobin. Medical management at this time.   - Aspirin and Plavix (holding for now though) - Atorvastatin 80 mg QD  #Elevated BP:  BP continues to be within normal limits at this time. No intervention necessary at this time. Continue to monitor.    Dispo: Anticipated discharge pending clinical improvement.   Dr. Jose Persia Internal Medicine PGY-1  Pager: [phone] 02/11/2019, 8:50 AM

## 2019-03-07 DIAGNOSIS — R0609 Other forms of dyspnea: Secondary | ICD-10-CM | POA: Diagnosis not present

## 2019-03-07 DIAGNOSIS — I10 Essential (primary) hypertension: Secondary | ICD-10-CM | POA: Diagnosis not present

## 2019-03-07 DIAGNOSIS — D62 Acute posthemorrhagic anemia: Secondary | ICD-10-CM

## 2019-03-07 DIAGNOSIS — Z9889 Other specified postprocedural states: Secondary | ICD-10-CM

## 2019-03-07 DIAGNOSIS — D649 Anemia, unspecified: Secondary | ICD-10-CM | POA: Diagnosis not present

## 2019-03-07 DIAGNOSIS — I50810 Right heart failure, unspecified: Secondary | ICD-10-CM | POA: Diagnosis not present

## 2019-03-07 DIAGNOSIS — R799 Abnormal finding of blood chemistry, unspecified: Secondary | ICD-10-CM | POA: Diagnosis not present

## 2019-03-07 DIAGNOSIS — K922 Gastrointestinal hemorrhage, unspecified: Secondary | ICD-10-CM | POA: Diagnosis not present

## 2019-03-07 DIAGNOSIS — Z9861 Coronary angioplasty status: Secondary | ICD-10-CM | POA: Diagnosis not present

## 2019-03-07 DIAGNOSIS — Z515 Encounter for palliative care: Secondary | ICD-10-CM | POA: Diagnosis not present

## 2019-03-07 DIAGNOSIS — E43 Unspecified severe protein-calorie malnutrition: Secondary | ICD-10-CM | POA: Insufficient documentation

## 2019-03-07 DIAGNOSIS — I2510 Atherosclerotic heart disease of native coronary artery without angina pectoris: Secondary | ICD-10-CM | POA: Diagnosis not present

## 2019-03-07 DIAGNOSIS — Z7189 Other specified counseling: Secondary | ICD-10-CM | POA: Diagnosis not present

## 2019-03-07 DIAGNOSIS — I272 Pulmonary hypertension, unspecified: Secondary | ICD-10-CM | POA: Diagnosis not present

## 2019-03-07 DIAGNOSIS — J849 Interstitial pulmonary disease, unspecified: Secondary | ICD-10-CM | POA: Diagnosis not present

## 2019-03-07 DIAGNOSIS — J9601 Acute respiratory failure with hypoxia: Secondary | ICD-10-CM | POA: Diagnosis not present

## 2019-03-07 LAB — CBC WITH DIFFERENTIAL/PLATELET
Abs Immature Granulocytes: 0.21 10*3/uL — ABNORMAL HIGH (ref 0.00–0.07)
Abs Immature Granulocytes: 0.16 10*3/uL — ABNORMAL HIGH (ref 0.00–0.07)
Basophils Absolute: 0 10*3/uL (ref 0.0–0.1)
Basophils Absolute: 0 10*3/uL (ref 0.0–0.1)
Basophils Relative: 0 %
Basophils Relative: 0 %
Eosinophils Absolute: 0 10*3/uL (ref 0.0–0.5)
Eosinophils Absolute: 0 10*3/uL (ref 0.0–0.5)
Eosinophils Relative: 0 %
Eosinophils Relative: 0 %
HCT: 29.2 % — ABNORMAL LOW (ref 36.0–46.0)
HCT: 26.7 % — ABNORMAL LOW (ref 36.0–46.0)
Hemoglobin: 9.8 g/dL — ABNORMAL LOW (ref 12.0–15.0)
Hemoglobin: 9.1 g/dL — ABNORMAL LOW (ref 12.0–15.0)
Immature Granulocytes: 1 %
Immature Granulocytes: 1 %
Lymphocytes Relative: 3 %
Lymphocytes Relative: 3 %
Lymphs Abs: 0.6 10*3/uL — ABNORMAL LOW (ref 0.7–4.0)
Lymphs Abs: 0.5 10*3/uL — ABNORMAL LOW (ref 0.7–4.0)
MCH: 30.5 pg (ref 26.0–34.0)
MCH: 31.2 pg (ref 26.0–34.0)
MCHC: 33.6 g/dL (ref 30.0–36.0)
MCHC: 34.1 g/dL (ref 30.0–36.0)
MCV: 91 fL (ref 80.0–100.0)
MCV: 91.4 fL (ref 80.0–100.0)
Monocytes Absolute: 0.4 10*3/uL (ref 0.1–1.0)
Monocytes Absolute: 0.5 10*3/uL (ref 0.1–1.0)
Monocytes Relative: 2 %
Monocytes Relative: 3 %
Neutro Abs: 16.6 10*3/uL — ABNORMAL HIGH (ref 1.7–7.7)
Neutro Abs: 15 10*3/uL — ABNORMAL HIGH (ref 1.7–7.7)
Neutrophils Relative %: 94 %
Neutrophils Relative %: 93 %
Platelets: 358 10*3/uL (ref 150–400)
Platelets: 329 10*3/uL (ref 150–400)
RBC: 3.21 MIL/uL — ABNORMAL LOW (ref 3.87–5.11)
RBC: 2.92 MIL/uL — ABNORMAL LOW (ref 3.87–5.11)
RDW: 12.6 % (ref 11.5–15.5)
RDW: 12.5 % (ref 11.5–15.5)
WBC: 17.8 10*3/uL — ABNORMAL HIGH (ref 4.0–10.5)
WBC: 16.2 10*3/uL — ABNORMAL HIGH (ref 4.0–10.5)
nRBC: 0 % (ref 0.0–0.2)
nRBC: 0 % (ref 0.0–0.2)

## 2019-03-07 LAB — GLUCOSE, CAPILLARY
Glucose-Capillary: 130 mg/dL — ABNORMAL HIGH (ref 70–99)
Glucose-Capillary: 157 mg/dL — ABNORMAL HIGH (ref 70–99)
Glucose-Capillary: 188 mg/dL — ABNORMAL HIGH (ref 70–99)

## 2019-03-07 LAB — BASIC METABOLIC PANEL
Anion gap: 11 (ref 5–15)
BUN: 32 mg/dL — ABNORMAL HIGH (ref 8–23)
CO2: 25 mmol/L (ref 22–32)
Calcium: 8.5 mg/dL — ABNORMAL LOW (ref 8.9–10.3)
Chloride: 100 mmol/L (ref 98–111)
Creatinine, Ser: 0.64 mg/dL (ref 0.44–1.00)
GFR calc Af Amer: >60 mL/min (ref >60)
GFR calc non Af Amer: >60 mL/min (ref >60)
Glucose, Bld: 136 mg/dL — ABNORMAL HIGH (ref 70–99)
Potassium: 4.1 mmol/L (ref 3.5–5.1)
Sodium: 136 mmol/L (ref 135–145)

## 2019-03-07 LAB — PREPARE RBC (CROSSMATCH)

## 2019-03-07 LAB — ABO/RH: ABO/RH(D): O POS

## 2019-03-07 MED ORDER — PREDNISONE 20 MG PO TABS
40.0000 mg | ORAL_TABLET | Freq: Every day | ORAL | Status: DC
  Administered 2019-03-08 – 2019-03-11 (×4): 40 mg via ORAL
  Filled 2019-03-07 (×4): qty 2

## 2019-03-07 MED ORDER — ENSURE ENLIVE PO LIQD
237.0000 mL | Freq: Two times a day (BID) | ORAL | Status: DC
  Administered 2019-03-07 – 2019-03-11 (×8): 237 mL via ORAL

## 2019-03-07 MED ORDER — MUPIROCIN 2 % EX OINT
TOPICAL_OINTMENT | CUTANEOUS | Status: AC
  Filled 2019-03-07: qty 22

## 2019-03-07 MED ORDER — SODIUM CHLORIDE 0.9% IV SOLUTION
Freq: Once | INTRAVENOUS | Status: AC
  Administered 2019-03-07: 10:00:00 via INTRAVENOUS

## 2019-03-07 MED ORDER — PANTOPRAZOLE SODIUM 40 MG PO TBEC
40.0000 mg | DELAYED_RELEASE_TABLET | Freq: Every day | ORAL | Status: DC
  Administered 2019-03-08: 40 mg via ORAL
  Filled 2019-03-07: qty 1

## 2019-03-07 MED ORDER — ADULT MULTIVITAMIN W/MINERALS CH
1.0000 | ORAL_TABLET | Freq: Every day | ORAL | Status: DC
  Administered 2019-03-07 – 2019-03-10 (×4): 1 via ORAL
  Filled 2019-03-07 (×5): qty 1

## 2019-03-07 NOTE — Progress Notes (Signed)
Initial Nutrition Assessment  DOCUMENTATION CODES:   Severe malnutrition in context of chronic illness, Underweight  INTERVENTION:   -Ensure Enlive po BID, each supplement provides 350 kcal and 20 grams of protein -Magic cup TID with meals, each supplement provides 290 kcal and 9 grams of protein -MVI with minerals daily  NUTRITION DIAGNOSIS:   Severe Malnutrition related to chronic illness(COPD) as evidenced by severe fat depletion, severe muscle depletion.  GOAL:   Patient will meet greater than or equal to 90% of their needs  MONITOR:   PO intake, Supplement acceptance, Labs, Weight trends, Skin, I & O's  REASON FOR ASSESSMENT:   LOS    ASSESSMENT:   Ms. Marguetta Windish is a 68 y/o female with a PMHx of COPD that presents to the ED with c/o shortness of breathe.  Pt admitted with COPD exacerbation.   10/22- s/p R/LHC- no significant PAH  Reviewed I/O's: -1.4 L x 24 hours and -6.8 L since 02/21/19  UOP: 2 L x 24 hours  PCI cancelled yesterday due to dropping hemoglobin. GI was consulted and no plans to pursue EGD as pt is at risk risk for intubation.   Pt resting quietly at time of visit. She did not arouse to voice. Per staff, pt has been very anxious over the past few days.   Pt with good appetite. Noted meal completion 50-100%. Due to increased nutritional needs due to underweight status, malnutrition, and chronic illnesses, will order nutritional supplements.   Palliative care following for goals of care discussions.  Medications reviewed and include solu-medrol and prednisone.   Labs reviewed: CBGS: 188.   NUTRITION - FOCUSED PHYSICAL EXAM:    Most Recent Value  Orbital Region  Severe depletion  Upper Arm Region  Severe depletion  Thoracic and Lumbar Region  Severe depletion  Buccal Region  Severe depletion  Temple Region  Severe depletion  Clavicle Bone Region  Severe depletion  Clavicle and Acromion Bone Region  Severe depletion  Scapular Bone  Region  Severe depletion  Dorsal Hand  Severe depletion  Patellar Region  Severe depletion  Anterior Thigh Region  Severe depletion  Posterior Calf Region  Severe depletion  Edema (RD Assessment)  None  Hair  Reviewed  Eyes  Reviewed  Mouth  Reviewed  Skin  Reviewed  Nails  Reviewed       Diet Order:   Diet Order            Diet Heart Room service appropriate? Yes; Fluid consistency: Thin  Diet effective now              EDUCATION NEEDS:   Education needs have been addressed  Skin:  Skin Assessment: Reviewed RN Assessment  Last BM:  02/11/2019  Height:   Ht Readings from Last 1 Encounters:  02/28/19 5\' 6"  (1.676 m)    Weight:   Wt Readings from Last 1 Encounters:  03/07/19 45.9 kg    Ideal Body Weight:  59.1 kg  BMI:  Body mass index is 16.33 kg/m.  Estimated Nutritional Needs:   Kcal:  1800-2000  Protein:  90-105 grams  Fluid:  > 1.8 L    Vesper Trant A. Jimmye Norman, RD, LDN, [place] Registered Dietitian II Certified Diabetes Care and Education Specialist Pager: [phone] After hours Pager: [phone]

## 2019-03-07 NOTE — Progress Notes (Signed)
Daily Rounding Note  03/07/2019, 12:27 PM  LOS: 17 days   SUBJECTIVE:   Chief complaint:    Anemia Remains dyspneic at rest.  With palliative care today and is hopeful for improvement,  "we are going to watch this for a few days and I do believe in a higher power".  She is still full code. No bowel movements today.  Patient tells me she had a bowel movement yesterday and staff member told her they saw a small amount of blood but the stool itself was brown.  Patient has no abdominal pain. Breathing is better but still feels short of breath.  Still feels anxious  OBJECTIVE:         Vital signs in last 24 hours:    Temp:  [97.8 F (36.6 C)-98.7 F (37.1 C)] 97.8 F (36.6 C) (10/28 1102) Pulse Rate:  [97-108] 99 (10/28 1102) Resp:  [18-36] 24 (10/28 1102) BP: (90-144)/(61-93) 120/70 (10/28 1102) SpO2:  [93 %-100 %] 100 % (10/28 1102) Weight:  [45.9 kg] 45.9 kg (10/28 0500) Last BM Date: 2019-03-30 Filed Weights   03/05/19 0625 03/30/2019 0411 03/07/19 0500  Weight: 45.7 kg 46.9 kg 45.9 kg   General: Dyspneic, looks chronically ill, cachectic Heart: RRR Chest: Pronounced rough crackles in the bases, worse on right side. Abdomen: Soft.  Not tender.  Active bowel sounds.  Not distended Extremities: No CCE. Neuro/Psych: Oriented x3.  Appropriate.  Seems less anxious today.  Intake/Output from previous day: 10/27 0701 - 10/28 0700 In: 593.1 [P.O.:360; I.V.:233.1] Out: 1950 [Urine:1950]  Intake/Output this shift: Total I/O In: 340 [P.O.:340] Out: 450 [Urine:450]  Lab Results: Recent Labs    March 30, 2019 1221 Mar 30, 2019 2126 03/07/19 0457  WBC 18.5* 19.5* 17.8*  HGB 11.0* 10.6* 9.8*  HCT 33.5* 31.2* 29.2*  PLT 420* 384 358   BMET Recent Labs    03/05/19 0614 03-30-2019 0453 03/07/19 0457  NA 137 135 136  K 4.2 4.2 4.1  CL 100 101 100  CO2 26 22 25   GLUCOSE 114* 244* 136*  BUN 18 59* 32*  CREATININE 0.47 0.52  0.64  CALCIUM 9.2 8.6* 8.5*   LFT Recent Labs    03-30-19 0754  PROT 5.3*  ALBUMIN 2.7*  AST 22  ALT 19  ALKPHOS 51  BILITOT 0.5  BILIDIR <0.1  IBILI NOT CALCULATED   PT/INR No results for input(s): LABPROT, INR in the last 72 hours. Hepatitis Panel No results for input(s): HEPBSAG, HCVAB, HEPAIGM, HEPBIGM in the last 72 hours.  Studies/Results: Ct Abdomen Pelvis Wo Contrast  Result Date: 2019/03/30 CLINICAL DATA:  68 year old female with concern for acute blood loss anemia. EXAM: CT ABDOMEN AND PELVIS WITHOUT CONTRAST TECHNIQUE: Multidetector CT imaging of the abdomen and pelvis was performed following the standard protocol without IV contrast. COMPARISON:  CT chest 02/26/2019 FINDINGS: Lower chest: No acute finding of the lower chest. Redemonstration of bronchiectasis, architectural distortion, an reticulation. No pleural effusion. Hepatobiliary: Unremarkable liver. Gallbladder somewhat distended though with no inflammatory changes or pericholecystic fluid. No radiopaque stones. Unremarkable intrahepatic and extrahepatic biliary system. Pancreas: Unremarkable Spleen: Unremarkable Adrenals/Urinary Tract: Unremarkable appearance of the adrenal glands. No evidence of hydronephrosis of the right or left kidney. No right nephrolithiasis. Punctate calcifications in the inferior collecting system of the left kidney. Unremarkable course of the bilateral ureters. Unremarkable appearance of the urinary bladder. Stomach/Bowel: Small hiatal hernia. Unremarkable stomach. Unremarkable small bowel. Normal appendix. No fluid level within the colon.  Large formed stool burden including formed stool in the rectum. Vascular/Lymphatic: Atherosclerotic changes of the aorta. No adenopathy. Reproductive: Unremarkable pelvic structures. Other: No hernia. Musculoskeletal: No acute displaced fracture of the visualized thoracolumbar spine. Superior endplate changes of Z16, similar to the comparison CT. Vacuum disc  phenomenon at L5-S1 with disc space narrowing and endplate changes. IMPRESSION: No acute CT finding, specifically with no evidence of hematoma. Large formed stool burden throughout the colon without evidence of obstruction, potentially representing constipation. Nonobstructive left-sided nephrolithiasis. Aortic Atherosclerosis (ICD10-I70.0). Additional ancillary findings as above. Electronically Signed   By: Corrie Mckusick D.O.   On: 03/03/2019 10:00   Scheduled Meds:  atorvastatin  80 mg Oral q1800   feeding supplement (ENSURE ENLIVE)  237 mL Oral BID BM   FLUoxetine  40 mg Oral Daily   influenza vaccine adjuvanted  0.5 mL Intramuscular Tomorrow-1000   ipratropium-albuterol  3 mL Nebulization TID   mouth rinse  15 mL Mouth Rinse q12n4p   methylPREDNISolone (SOLU-MEDROL) injection  125 mg Intravenous Q6H   multivitamin with minerals  1 tablet Oral Daily   mupirocin ointment       pantoprazole (PROTONIX) IV  40 mg Intravenous Q12H   [START ON 03/08/2019] predniSONE  40 mg Oral Q breakfast   sodium chloride flush  3 mL Intravenous Q12H   sodium chloride flush  3 mL Intravenous Q12H   Continuous Infusions:  sodium chloride     sodium chloride     sodium chloride 10 mL/hr at 03/02/2019 0637   PRN Meds:.sodium chloride, sodium chloride, [DISCONTINUED] acetaminophen **OR** acetaminophen, acetaminophen, albuterol, calcium carbonate, hydrALAZINE, HYDROcodone-acetaminophen, ondansetron (ZOFRAN) IV, polyethylene glycol, senna-docusate, sodium chloride, sodium chloride flush, sodium chloride flush   ASSESMENT:   *    Normocytic anemia.  Progressive decline of hemoglobins over the last several days has not yet required transfusion.  FOBT positive, no overt bleeding. ?  Distal esophageal stricture on 2017 esophagram.  *     Pulmonary fibrosis/interstitial lung disease being treated with high-dose Solu-Medrol >> prednisone.  Presented with now improving acute respiratory failure.   Pulmonary status is problematic in terms of sedation if upper endoscopy were pursued. Critical care/pulmonary note today states "She is high risk for pulmonary complication for any procedure including endoscopy and cardiac catheterization.Marland KitchenMarland KitchenMarland KitchenLong term prognosis is poor, palliative is following. She says pretty clearly she doesn't want to do overly aggressive things, but is unable to make [place] decisions at this time."   *    80% LAD stenosis on cath 10/22.  Planned stenting for 10/27 canceled due to anemia.  Was on Plavix for 5 days, discontinued yesterday 10/27.   Right heart failure, pulmonary hypertension   PLAN     *     No plans for endoscopy/colonoscopy.  Advance diet back to heart healthy  *    Oral rather than IV Protonix.  *   Atending MD ordered transfusion with 1 unit PRBC at 9 AM this morning.  Indication: symptomatic anemia/active GI bleed. I am not convinced transfusion is indicated.  Yes her Hgb is dropping but it is 9.8 and transfusion parameter is usually a Hgb of 7 or less.  Also her dyspnea is better today.  She is not having any active GI bleeding right now.  I would consider holding off on transfusing her.     Julia Massey  03/07/2019, 12:27 PM Phone [phone]

## 2019-03-07 NOTE — Progress Notes (Signed)
NAME:  Julia Massey, MRN:  [id number], DOB:  [birth year], LOS: 32 ADMISSION DATE:  02/15/2019, CONSULTATION DATE:  02/19/19 REFERRING MD:  Valli Glance IMTS, CHIEF COMPLAINT:  Acute on chronic respiratory failure  Brief History   68 year old with COPD, diastolic heart failure presenting with acute hypoxic respiratory failure on high flow nasal cannula Work-up consists of connective tissue serology showing elevated rheumatoid factor.  She had been told as an outpatient that she may have rheumatoid arthritis versus gout She is an ex-smoker, quit in 2017.  Used to work as a Office manager.  No known exposures with no birds or feather pillows or comforters or mold exposure  Past Medical History  COPD Carpal Tunnel Chronic back pain Gout HLD Diastolic heart dysfunction   Significant Hospital Events   10/11 admitted  10/12 PCCM consulted.  10/21 - says not better. On 10LNC at rest and as she talks pulse ox is 88%. Says pre-admit was not on o2 but doing well. Says brother in [place] who is 29 years older Yetta Flock) also diagnosed with pulmonary fibrosis in feb 2020. She wants me to call him. Chart review shows RV strain + but she does not have dema.  Chart review shows essentially negative serology  (RF is < 2x ULN). HRCT shows UIP wiuth GGO - pretty c/w UIP flare  10/22  - Relatively normal RHC and no rv failure. 80% LAD stenosis. Start solumedrol 125mg  IV q6h. Started higher dose IV steroids (Solumedrol 125 q6 hrs)  10/23 -  s/p cardiac MRI. Results pending. Cards prefers to see course with steroids and results of cardiac MRI before deciding on stent.   10/27 - plan for LHC today, but had 4gm drop in hgb so procedure on hold.   Consults:  PCCM  Procedures:   Significant Diagnostic Tests:  10/12 CT angio chest> No PE. enlarged heart, no pericardial effusion. Diffuse ground-glass appearance throughout lungs. No Pleural Effusion. Emphysematous changes.   10/19 CT  high-resolution> Basilar predominant fibrosis honeycombing.  Definite UIP pattern. Reviewed images personally.  10/27 CT abdomen> No acute CT finding, specifically with no evidence of hematoma.  Micro Data:  10/11 SARS CoV2> neg  10/12 RVP neg  Antimicrobials:  Azithroymycin 10/11-10/13 Rocephin 10/11-10/13  Interim history/subjective:  Feels better today and is in much better spirits.   Objective   Blood pressure 114/63, pulse (!) 103, temperature 98.2 F (36.8 C), temperature source Oral, resp. rate (!) 36, height 5\' 6"  (1.676 m), weight 45.9 kg, SpO2 95 %.        Intake/Output Summary (Last 24 hours) at 03/07/2019 0830 Last data filed at 03/07/2019 4132 Gross per 24 hour  Intake 593.06 ml  Output 1950 ml  Net -1356.94 ml   Filed Weights   03/05/19 0625 02/13/2019 0411 03/07/19 0500  Weight: 45.7 kg 46.9 kg 45.9 kg   Gen:      Frail elderly female who is visibly dyspneic  Lungs:    Bilateral rales.  CV:         RRR, no MRG Abd:      Soft, non-tender Ext:    No edema.  Skin:      Warm, dry, no rash. Intact.  Neuro:   Alert, oriented, non-focal  Resolved Hospital Problem list     Assessment & Plan:   Acute on chronic respiratory failure with hypoxemia:  CT findings consistent with UIP, IPF. She has history of RA as well, but serum RF not consistent with rheumatoid  lung. May be a small component of CHF contributing.   Plan Change steroids: prednisone 1mg /kg Continue to wean oxygen for SpO2 goal 88-95% (14L [place] HFNC currently) doesn't seem like she will be able to wean much. Advanced fibrosis.  PR/OT as able. Low reserves.  No lung biopsy needed for diagnosis so OK to DAPT/anticoagulate as seen fit by cardiology. (obviously on hold now given anemia/GIB?) Anemia workup per primary She is high risk for pulmonary complication for any procedure including endoscopy and cardiac catheterization.  Long term prognosis is poor, palliative is following. She says pretty  clearly she doesn't want to do overly aggressive things, but is unable to make GOC decisions at this time.    PCCM will continue to follow.   , AGACNP-BC [facility] Pulmonary/Critical Care Pager [phone] or [phone]  03/07/2019 8:30 AM

## 2019-03-07 NOTE — Progress Notes (Signed)
Patient ID: Julia Massey, female   DOB: 1951/05/10, 68 y.o.   MRN: [id number]  This NP visited patient at the bedside as a follow up for palliative medicine needs and emotional support.  Patient  remains dyspneic at rest.   Patient verbalizes a clear understanding of the  ongoing medical complications and treatment option decisions.  Plan is for blood transfusion today secondary to continued decline in hemoglobin/9.8.  As noted yesterday EGD on hold for investigation and plan is to medically manage and observe holding off on scope secondary to her tenuous pulmonary situation.  Patient remains on 10 L of high flow  I did raise questions regarding her advanced care planning specific to [place] today.  She verbalizes an understanding of the seriousness of her medical situation, however at this time she remains hopeful for improvement.  "We are going to watch this for a few days and I do believe in the higher power"  Emotional support offered    Questions and concerns addressed     Total time spent on the unit was 20 minutes, discussed with bedside RN  Greater than 50% of the time was spent in counseling and coordination of care  Wadie Lessen NP  Palliative Medicine Team Team Phone # [phone] Pager [phone]

## 2019-03-07 NOTE — Progress Notes (Signed)
Internal Medicine Attending:   I saw and examined the patient. I reviewed the resident's note and I agree with the resident's findings and plan as documented in the resident's note.  Patient states that she still has some persistent shortness of breath and remains dyspneic at rest.  She does state that her shortness of breath is mildly improved today.  Patient initially mated to hospital with acute hypoxic respiratory failure I was found to have ILD likely IPF.  Patient continues to require significant oxygen supplementation with 15 L nasal cannula.  Will attempt to wean this down as possible.  Pulmonary follow-up and recommendations appreciated.  Will transition patient from Solu-Medrol 125 mg 4 times daily to prednisone 1 mg/kg/day beginning tomorrow.  Patient's hospital course has been complicated by acute blood loss anemia with hemoglobin decreasing from 14.2 on October 26 to 9.8 today.  During this time patient's oxygen requirements have increased from 8 L nasal cannula to 15 L nasal cannula.  Of note, patient also has an 80% lesion in her LAD.  Even though usual parameters for transfusion is above 8 for patients with CAD I am concerned that she may be having some subjective symptoms from her sudden drop in hemoglobin and she may benefit from 1 unit PRBC.  GI follow-up and recommendations appreciated.  Continue PPI for now.  Hold Lovenox, aspirin and Plavix for now.  We will monitor CBC closely.  No EGD at this time as risk may outweigh benefits as patient will likely need to be intubated for this procedure and may not be able to come off the vent to the point.  We will continue to monitor closely.

## 2019-03-07 NOTE — Progress Notes (Signed)
Subjective:    Julia Massey states she is doing okay today. She is still having some anxiety overall but feels she is managing well by focusing on her breathing. She endorses that her SOB is a little improved today overall. She has been having some abdominal cramps as well. Her last BM was yesterday and was not able to see what it looked like.   We discussed the findings that acute blood loss is likely GI related given her positive hemoccult. She understands why endoscopy is not a safe option for her at this time. She is agreeable to a transfusion if needed.   Objective:  Vital signs in last 24 hours: Vitals:   02/11/2019 2058 03/07/19 0011 03/07/19 0100 03/07/19 0500  BP:  (!) 144/89  123/86  Pulse:  (!) 108  (!) 104  Resp: 20 18  20   Temp:  98.2 F (36.8 C)  98.1 F (36.7 C)  TempSrc:  Oral  Oral  SpO2:  99% 97% 97%  Weight:    45.9 kg  Height:        Physical Exam Vitals signs and nursing note reviewed.  Constitutional:      General: She is not in acute distress.    Appearance: She is well-developed. She is not toxic-appearing.  Cardiovascular:     Rate and Rhythm: Regular rhythm. Tachycardia present.     Heart sounds: No murmur.  Pulmonary:     Effort: Tachypnea present. No accessory muscle usage or respiratory distress.     Breath sounds: Rales (bilateral in all lung fields, most severe bibasilar. Dry in nature, unchanged from previous examination) present. No decreased breath sounds, wheezing or rhonchi.  Abdominal:     General: Bowel sounds are normal.     Palpations: Abdomen is soft.     Tenderness: There is no abdominal tenderness. There is no guarding.  Musculoskeletal:     Comments: No flank tenderness  Skin:    General: Skin is warm and dry.     Findings: No ecchymosis or rash.     Comments: No flank hematoma. No hematoma surrounding right groin cath site.   Neurological:     General: No focal deficit present.     Mental Status: She is alert and oriented to  person, place, and time.  Psychiatric:        Behavior: Behavior normal.    Assessment/Plan:  Active Problems:   DNR (do not resuscitate) discussion   Acute respiratory failure with hypoxemia (HCC)   Palliative care by specialist   Weakness generalized   Dyspnea  # Acute hypoxic respiratory failure  CTA on 10/12 was notable for bibasilar honeycombing, diffuse ground glass opacities, enlarged right ventricle and pulmonary arteries consistent with pulmonary artery hypertension and chronic changes consistent with interstitial lung disease. PCCM was consulted and noted possible ILD. Rheumatologic workup notable for elevated sed rate and positive RF, rest of serology were negative including CCP. High-resolution CT on 10/19 showed significant ground glass opacities and findings consistent with UIP with possible overlying edema vs UIP flare. 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 Despite the lack of mortality benefit and possible harm, patient will require steroids to maintain her pulmonary function.   A trial with IV solumedrol was completed but it did not significantly improve her oxygen requirement so plan is to wean back down to oral Prednisone tomorrow per PCCM. Otherwise, continue working on weaning oxygen as tolerated.   - PCCM on board and we appreciate their  recommendations - Telemetry - HFNC, maintain saturation above >80% - Wean as tolerated - Duoneb TID  - Solumedrol 125mg  QID for today - Prednisone 40mg  QD tomorrow   # Acute GI Bleed Hemoglobin continues to decrease to 9.6 today. Type and cross sent. Given positive fecal hemoccult yesterday, GI was consulted. Unfortunately, they do not think she would be a good candidate for EGD given worsening oxygen requirement. They discussed this with the patient while palliative was at bedside. Today, they were recommending switching to PO Protonix.   Given that her hemoglobin continues to drop and since the decrease,  she had worsening tachycardia, the decision to transfuse 1 unit was made today. Patient is also a cardiac patient and usually is polycythemic, so current hemoglobin is very below her baseline.   - GI is on board and we appreciate their recommendations  - Protonix 40mg  PO - Hold Lovenox, Aspirin and Plavix for now - Transfuse 1 unit of pRBCs today - CBC every 12 hours   # Pulmonary Hypertension # Right Heart Failure  Last echo in 2017 showed grade 1 diastolic dysfunction with preserved EF at 55-60%. BNP on this admission was 1170. TTE on 10/12 showed right ventricle has severely reduced systolic function with moderate pulmonary hypertension. Repeat TTE on 10/19 showed continued right heart failure but not as severe as prior. Heart cath on 10/22 did not show significant pulmonary htn or right heart strain. cMRI showed some hypokinesis at the RV base with dyskinesis at RV apex. Dr. 12/12 stated in his note he will follow up these results with the imaging team as it is unusual morphology. No additional intervention required at this time, will continue to monitor.   Stable.   - HF is on board and we appreciate their recommendations  - Telemetry - Strict I/Os  - Daily weights  # LAD Stenosis:  Coronary angio on 10/22 showed a 80% stenosis of the LAD. Cardiology recommends PCI. PCI was scheduled but had to be cancelled due to acute GI bleed. Medical management at this time.   - Aspirin and Plavix (holding for now though) - Atorvastatin 80 mg QD  #Elevated BP:  BP continues to be within normal limits at this time. No intervention necessary at this time. Continue to monitor.    Dispo: Anticipated discharge pending clinical improvement.   Dr. 11/22 Internal Medicine PGY-1  Pager: [phone] 03/07/2019, 6:56 AM

## 2019-03-07 NOTE — Progress Notes (Signed)
Physical Therapy Treatment Patient Details Name: Julia Massey MRN: [id number] DOB: 1950/06/08 Today's Date: 03/07/2019    History of Present Illness Pt is a 68 y/o female admitted 03/08/2019 with acute hypoxic respiratory failure likely secondary to acute pneumonitis. Pt placed on BiPap which has since been removed. S/p heart cath on 10/22. Planned stenting 10/27 cancelled due to anemia. PMH includes COPD, gout.   PT Comments    Pt slowly progressing with mobility. Moves well, but limited by decreased activity tolerance and reserve, requiring prolonged seated rest breaks after minimal activity. SpO2 down to 78% on 15L HFNC; eventually recovering to >95% with rest and deep breathing. Will continue to follow acutely.    Follow Up Recommendations  Home health PT(pending progress)     Equipment Recommendations  Rolling walker with 5" wheels    Recommendations for Other Services       Precautions / Restrictions Precautions Precautions: Fall Precaution Comments: Drop in SpO2 with minimal activity on 15L HFNC Restrictions Weight Bearing Restrictions: No    Mobility  Bed Mobility Overal bed mobility: Modified Independent Bed Mobility: Supine to Sit;Sit to Supine     Supine to sit: Modified independent (Device/Increase time) Sit to supine: Modified independent (Device/Increase time)   General bed mobility comments: Increased time and effort; prolonged time sitting EOB to recover  Transfers Overall transfer level: Needs assistance Equipment used: 1 person hand held assist Transfers: Sit to/from Stand Sit to Stand: Supervision         General transfer comment: Able to stand from bed and BSC, only requiring assist for HHA  Ambulation/Gait Ambulation/Gait assistance: Min guard   Assistive device: 1 person hand held assist       General Gait Details: Steps from bed<>BSC with HHA; prolonged seated rest break to recover on BSC and once returned siitting EOB. Unable to  tolerate further distance; SpO2 down to 78% on 15L HFNC. Declining transfer to Engineer, production    Modified Rankin (Stroke Patients Only)       Balance Overall balance assessment: Needs assistance Sitting-balance support: No upper extremity supported;Feet supported Sitting balance-Leahy Scale: Good       Standing balance-Leahy Scale: Fair Standing balance comment: Can static stand to perform pericare without UE support                            Cognition Arousal/Alertness: Awake/alert Behavior During Therapy: WFL for tasks assessed/performed;Anxious Overall Cognitive Status: Within Functional Limits for tasks assessed                                        Exercises      General Comments General comments (skin integrity, edema, etc.): SpO2 down to 78% on 15L HFNC, pt requiring multiple, prolonged seated rest break after minimal activity to recover to 85-88% on 15L, eventually returning to >95% when pt not talking and focused on deep breathing      Pertinent Vitals/Pain Pain Assessment: No/denies pain    Home Living                      Prior Function            PT Goals (current goals can now be found in the care plan section)  Progress towards PT goals: (Limited by low sats with minimal activity)    Frequency    Min 3X/week      PT Plan Current plan remains appropriate    Co-evaluation              AM-PAC PT "6 Clicks" Mobility   Outcome Measure  Help needed turning from your back to your side while in a flat bed without using bedrails?: None Help needed moving from lying on your back to sitting on the side of a flat bed without using bedrails?: None Help needed moving to and from a bed to a chair (including a wheelchair)?: None Help needed standing up from a chair using your arms (e.g., wheelchair or bedside chair)?: A Little Help needed to walk in hospital room?: A  Little Help needed climbing 3-5 steps with a railing? : A Lot 6 Click Score: 20    End of Session Equipment Utilized During Treatment: Oxygen Activity Tolerance: Treatment limited secondary to medical complications (Comment) Patient left: in bed;with call bell/phone within reach;with bed alarm set Nurse Communication: Mobility status PT Visit Diagnosis: Muscle weakness (generalized) (M62.81);Difficulty in walking, not elsewhere classified (R26.2)     Time: 4888-9169 PT Time Calculation (min) (ACUTE ONLY): 20 min  Charges:  $Therapeutic Activity: 8-22 mins                    Mabeline Caras, PT, DPT Acute Rehabilitation Services  Pager [phone] Office [place] 03/07/2019, 3:14 PM

## 2019-03-08 DIAGNOSIS — Z7189 Other specified counseling: Secondary | ICD-10-CM | POA: Diagnosis not present

## 2019-03-08 DIAGNOSIS — R918 Other nonspecific abnormal finding of lung field: Secondary | ICD-10-CM | POA: Diagnosis not present

## 2019-03-08 DIAGNOSIS — D62 Acute posthemorrhagic anemia: Secondary | ICD-10-CM | POA: Diagnosis not present

## 2019-03-08 DIAGNOSIS — I2510 Atherosclerotic heart disease of native coronary artery without angina pectoris: Secondary | ICD-10-CM | POA: Diagnosis not present

## 2019-03-08 DIAGNOSIS — I272 Pulmonary hypertension, unspecified: Secondary | ICD-10-CM | POA: Diagnosis not present

## 2019-03-08 DIAGNOSIS — Z515 Encounter for palliative care: Secondary | ICD-10-CM | POA: Diagnosis not present

## 2019-03-08 DIAGNOSIS — I10 Essential (primary) hypertension: Secondary | ICD-10-CM | POA: Diagnosis not present

## 2019-03-08 DIAGNOSIS — K922 Gastrointestinal hemorrhage, unspecified: Secondary | ICD-10-CM | POA: Diagnosis not present

## 2019-03-08 DIAGNOSIS — I50810 Right heart failure, unspecified: Secondary | ICD-10-CM | POA: Diagnosis not present

## 2019-03-08 DIAGNOSIS — R0600 Dyspnea, unspecified: Secondary | ICD-10-CM | POA: Diagnosis not present

## 2019-03-08 DIAGNOSIS — R531 Weakness: Secondary | ICD-10-CM | POA: Diagnosis not present

## 2019-03-08 DIAGNOSIS — R0609 Other forms of dyspnea: Secondary | ICD-10-CM | POA: Diagnosis not present

## 2019-03-08 DIAGNOSIS — Z9889 Other specified postprocedural states: Secondary | ICD-10-CM | POA: Diagnosis not present

## 2019-03-08 DIAGNOSIS — J849 Interstitial pulmonary disease, unspecified: Secondary | ICD-10-CM | POA: Diagnosis not present

## 2019-03-08 DIAGNOSIS — Z9861 Coronary angioplasty status: Secondary | ICD-10-CM | POA: Diagnosis not present

## 2019-03-08 DIAGNOSIS — J9601 Acute respiratory failure with hypoxia: Secondary | ICD-10-CM | POA: Diagnosis not present

## 2019-03-08 DIAGNOSIS — R0603 Acute respiratory distress: Secondary | ICD-10-CM | POA: Diagnosis not present

## 2019-03-08 LAB — TYPE AND SCREEN
ABO/RH(D): O POS
Antibody Screen: NEGATIVE
Unit division: 0

## 2019-03-08 LAB — CBC WITH DIFFERENTIAL/PLATELET
Abs Immature Granulocytes: 0.18 10*3/uL — ABNORMAL HIGH (ref 0.00–0.07)
Abs Immature Granulocytes: 0 10*3/uL (ref 0.00–0.07)
Basophils Absolute: 0 10*3/uL (ref 0.0–0.1)
Basophils Absolute: 0 10*3/uL (ref 0.0–0.1)
Basophils Relative: 0 %
Basophils Relative: 0 %
Eosinophils Absolute: 0 10*3/uL (ref 0.0–0.5)
Eosinophils Absolute: 0 10*3/uL (ref 0.0–0.5)
Eosinophils Relative: 0 %
Eosinophils Relative: 0 %
HCT: 32.2 % — ABNORMAL LOW (ref 36.0–46.0)
HCT: 31.7 % — ABNORMAL LOW (ref 36.0–46.0)
Hemoglobin: 10.8 g/dL — ABNORMAL LOW (ref 12.0–15.0)
Hemoglobin: 10.7 g/dL — ABNORMAL LOW (ref 12.0–15.0)
Immature Granulocytes: 1 %
Lymphocytes Relative: 5 %
Lymphocytes Relative: 5 %
Lymphs Abs: 0.7 10*3/uL (ref 0.7–4.0)
Lymphs Abs: 0.9 10*3/uL (ref 0.7–4.0)
MCH: 30.7 pg (ref 26.0–34.0)
MCH: 30.7 pg (ref 26.0–34.0)
MCHC: 33.5 g/dL (ref 30.0–36.0)
MCHC: 33.8 g/dL (ref 30.0–36.0)
MCV: 91.5 fL (ref 80.0–100.0)
MCV: 91.1 fL (ref 80.0–100.0)
Monocytes Absolute: 0.9 10*3/uL (ref 0.1–1.0)
Monocytes Absolute: 0.9 10*3/uL (ref 0.1–1.0)
Monocytes Relative: 6 %
Monocytes Relative: 5 %
Neutro Abs: 13.5 10*3/uL — ABNORMAL HIGH (ref 1.7–7.7)
Neutro Abs: 15.3 10*3/uL — ABNORMAL HIGH (ref 1.7–7.7)
Neutrophils Relative %: 88 %
Neutrophils Relative %: 90 %
Platelets: 280 10*3/uL (ref 150–400)
Platelets: 269 10*3/uL (ref 150–400)
RBC: 3.52 MIL/uL — ABNORMAL LOW (ref 3.87–5.11)
RBC: 3.48 MIL/uL — ABNORMAL LOW (ref 3.87–5.11)
RDW: 13.4 % (ref 11.5–15.5)
RDW: 13.3 % (ref 11.5–15.5)
WBC: 15.3 10*3/uL — ABNORMAL HIGH (ref 4.0–10.5)
WBC: 17 10*3/uL — ABNORMAL HIGH (ref 4.0–10.5)
nRBC: 0 % (ref 0.0–0.2)
nRBC: 0 % (ref 0.0–0.2)
nRBC: 0 /100 WBC

## 2019-03-08 LAB — BASIC METABOLIC PANEL
Anion gap: 9 (ref 5–15)
BUN: 22 mg/dL (ref 8–23)
CO2: 26 mmol/L (ref 22–32)
Calcium: 8.3 mg/dL — ABNORMAL LOW (ref 8.9–10.3)
Chloride: 102 mmol/L (ref 98–111)
Creatinine, Ser: 0.48 mg/dL (ref 0.44–1.00)
GFR calc Af Amer: >60 mL/min (ref >60)
GFR calc non Af Amer: >60 mL/min (ref >60)
Glucose, Bld: 114 mg/dL — ABNORMAL HIGH (ref 70–99)
Potassium: 4 mmol/L (ref 3.5–5.1)
Sodium: 137 mmol/L (ref 135–145)

## 2019-03-08 LAB — BPAM RBC
Blood Product Expiration Date: 202012022359
ISSUE DATE / TIME: 202010282337
Unit Type and Rh: 5100

## 2019-03-08 LAB — C-REACTIVE PROTEIN: CRP: <0.8 mg/dL (ref <1.0)

## 2019-03-08 LAB — GLUCOSE, CAPILLARY
Glucose-Capillary: 109 mg/dL — ABNORMAL HIGH (ref 70–99)
Glucose-Capillary: 111 mg/dL — ABNORMAL HIGH (ref 70–99)
Glucose-Capillary: 136 mg/dL — ABNORMAL HIGH (ref 70–99)

## 2019-03-08 MED ORDER — PANTOPRAZOLE SODIUM 40 MG IV SOLR
40.0000 mg | Freq: Two times a day (BID) | INTRAVENOUS | Status: DC
  Administered 2019-03-08 – 2019-03-10 (×5): 40 mg via INTRAVENOUS
  Filled 2019-03-08 (×5): qty 40

## 2019-03-08 NOTE — Progress Notes (Signed)
Subjective:    Julia Massey reports she is feeling much better this morning. She is very excited to be improving. Her SOB is improved and she is looking forward to eating her breakfast. No acute complaints at this time.   Objective:  Vital signs in last 24 hours: Vitals:   03/08/19 0358 03/08/19 0530 03/08/19 0749 03/08/19 0801  BP: 138/82 137/77  129/81  Pulse: 84 79  83  Resp: 20   18  Temp: 97.7 F (36.5 C) 98 F (36.7 C)  97.9 F (36.6 C)  TempSrc: Oral Oral  Oral  SpO2: 98% 98% 99% 98%  Weight:  48.1 kg    Height:        Physical Exam Vitals signs and nursing note reviewed.  Constitutional:      General: She is not in acute distress.    Appearance: She is well-developed. She is not toxic-appearing.  Cardiovascular:     Rate and Rhythm: Regular rhythm. Tachycardia present.     Heart sounds: No murmur.  Pulmonary:     Effort: No tachypnea, accessory muscle usage or respiratory distress.     Breath sounds: Rales (bilateral in all lung fields, most severe bibasilar. Dry in nature, remains unchanged from previous examination) present. No decreased breath sounds, wheezing or rhonchi.  Musculoskeletal:     Right lower leg: She exhibits no tenderness. No edema.     Left lower leg: She exhibits no tenderness. No edema.  Skin:    General: Skin is warm and dry.     Findings: No ecchymosis or rash.  Neurological:     General: No focal deficit present.     Mental Status: She is alert and oriented to person, place, and time.  Psychiatric:        Behavior: Behavior normal.    Assessment/Plan:  Active Problems:   DNR (do not resuscitate) discussion   Acute respiratory failure with hypoxemia (HCC)   Palliative care by specialist   Weakness generalized   Dyspnea   Protein-calorie malnutrition, severe  # Acute hypoxic respiratory failure  CTA on 10/12 was notable for bibasilar honeycombing, diffuse ground glass opacities, enlarged right ventricle and pulmonary arteries  consistent with pulmonary artery hypertension and chronic changes consistent with interstitial lung disease. PCCM was consulted and noted possible ILD. Rheumatologic workup notable for elevated sed rate and positive RF, rest of serology were negative including CCP. High-resolution CT on 10/19 showed significant ground glass opacities and findings consistent with UIP with possible overlying edema vs UIP flare. 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 Despite the lack of mortality benefit and possible harm, patient will require steroids to maintain her pulmonary function.   Plan to wean back down to oral Prednisone today. Wean oxygen as tolerated. Per PCCM note, they will continue work up with repeat CRP, hypersensitivity pneumonitis, and ACE levels. Repeat chest xray ordered as well. Will follow up results.   - PCCM on board and we appreciate their recommendations - Telemetry - HFNC, maintain saturation above >80% - Wean as tolerated - Duoneb TID  - Prednisone 40mg  QD   # Acute GI Bleed Given acute drop in hemoglobin and positive fecal hemoccult on 10/27, GI was consulted. Unfortunately, they do not think she would be a good candidate for EGD given respiratory status, but would reapproach the topic if she becomes transfusion dependent or has massive melena or hematochezia. Initially GI changed Protonix to PO but recommend she stay on IV now for at least  72 hours. She has received her morning PO dose already so will start with IV again tonight. Given no intervention is warranted at this time, GI has signed off. Julia Massey received 1 unit of pRBCs last night and this helped increase her hemoglobin to 10.6.   - Protonix 40mg  IV BID  - Hold Lovenox, Aspirin and Plavix for now - CBC every 12 hours   # Pulmonary Hypertension # Right Heart Failure  Last echo in 2017 showed grade 1 diastolic dysfunction with preserved EF at 55-60%. BNP on this admission was 1170. TTE on 10/12  showed right ventricle has severely reduced systolic function with moderate pulmonary hypertension. Repeat TTE on 10/19 showed continued right heart failure but not as severe as prior. Heart cath on 10/22 did not show significant pulmonary htn or right heart strain. cMRI showed some hypokinesis at the RV base with dyskinesis at RV apex. Dr. 11/22 stated in his note he will follow up these results with the imaging team as it is unusual morphology. No additional intervention required at this time, will continue to monitor.   Stable.   - HF is on board and we appreciate their recommendations  - Telemetry - Strict I/Os  - Daily weights  # LAD Stenosis:  Coronary angio on 10/22 showed a 80% stenosis of the LAD. Cardiology recommended PCI but had to be cancelled due to acute GI bleed. Medical management at this time.   - Aspirin and Plavix (holding for now though) - Atorvastatin 80 mg QD  #Elevated BP:  BP continues to be within normal limits at this time. Suspect will decrease once steroids are tapered down. No intervention necessary at this time. Continue to monitor.    Dispo: Anticipated discharge pending clinical improvement.   Dr. 11/22 Internal Medicine PGY-1  Pager: [phone] 03/08/2019, 9:41 AM

## 2019-03-08 NOTE — Progress Notes (Signed)
NAME:  Julia Massey, MRN:  [id number], DOB:  June 23, 1950, LOS: 18 ADMISSION DATE:  02/25/2019, CONSULTATION DATE:  02/19/19 REFERRING MD:  Daron Offer IMTS, CHIEF COMPLAINT:  Acute on chronic respiratory failure  Brief History   68 year old with COPD, diastolic heart failure presenting with acute hypoxic respiratory failure on high flow nasal cannula Work-up consists of connective tissue serology showing elevated rheumatoid factor.  She had been told as an outpatient that she may have rheumatoid arthritis versus gout She is an ex-smoker, quit in 2017.  Used to work as a Air cabin crew.  No known exposures with no birds or feather pillows or comforters or mold exposure  Past Medical History  COPD Carpal Tunnel Chronic back pain Gout HLD Diastolic heart dysfunction   Significant Hospital Events   10/11 admitted  10/12 PCCM consulted.  10/21 - says not better. On 10LNC at rest and as she talks pulse ox is 88%. Says pre-admit was not on o2 but doing well. Says brother in [place] who is 6 years older Brooke Bonito) also diagnosed with pulmonary fibrosis in feb 2020. She wants me to call him. Chart review shows RV strain + but she does not have dema.  Chart review shows essentially negative serology  (RF is < 2x ULN). HRCT shows UIP wiuth GGO - pretty c/w UIP flare  10/22  - Relatively normal RHC and no rv failure. 80% LAD stenosis. Start solumedrol 125mg  IV q6h. Started higher dose IV steroids (Solumedrol 125 q6 hrs)  10/23 -  s/p cardiac MRI. Results pending. Cards prefers to see course with steroids and results of cardiac MRI before deciding on stent.   10/27 - plan for LHC today, but had 4gm drop in hgb so procedure on hold.   Consults:  PCCM GI  Procedures:   Significant Diagnostic Tests:  10/12 CT angio chest> No PE. enlarged heart, no pericardial effusion. Diffuse ground-glass appearance throughout lungs. No Pleural Effusion. Emphysematous changes.   10/19 CT  high-resolution> Basilar predominant fibrosis honeycombing.  Definite UIP pattern. Reviewed images personally.  10/27 CT abdomen> No acute CT finding, specifically with no evidence of hematoma.  Micro Data:  10/11 SARS CoV2> neg  10/12 RVP neg  Antimicrobials:  Azithroymycin 10/11-10/13 Rocephin 10/11-10/13  Interim history/subjective:  Patient states she feels much better today, she is weaned to 10 L/min high flow nasal cannula.  She is able to speak in full sentences and is eating.  Hemoglobin and hematocrit improved after receiving 1 unit of RBCs. GI was consulted and holding off on EGD due to tenuous respiratory status. VSS.   Objective   Blood pressure 129/81, pulse 83, temperature 97.9 F (36.6 C), temperature source Oral, resp. rate 18, height 5\' 6"  (1.676 m), weight 48.1 kg, SpO2 98 %.    FiO2 (%):  [64 %] 64 %   Intake/Output Summary (Last 24 hours) at 03/08/2019 1033 Last data filed at 03/08/2019 0800 Gross per 24 hour  Intake 1065 ml  Output 1100 ml  Net -35 ml   Filed Weights   02/24/2019 0411 03/07/19 0500 03/08/19 0530  Weight: 46.9 kg 45.9 kg 48.1 kg   General: Thin, frail.  Sitting up in bed.  No apparent distress. HENT: Normocephalic, PERRL. Moist mucus membranes Neck: No JVD. Trachea midline. No thyromegaly, no lymphadenopathy CV: RRR. S1S2. No MRG. +2 distal pulses Lungs: BBS posterior bibasilar inspiratory and expiratory crackles.  Good excursion.  FNL, symmetrical ABD: +BS x4. SNT/ND. No masses, guarding or rigidity GU:  No Foley EXT: MAE well. No edema Skin: PWD. In tact. No rashes or lesions Neuro: A&Ox3. CN II-XII in tact. No focal deficits Psych: Appropriate mood, insight and judgment for time and situation     Resolved Hospital Problem list     Assessment & Plan:   Acute on chronic respiratory failure with hypoxemia:  CT findings consistent with UIP, IPF. She has history of RA as well, but serum RF not consistent with rheumatoid lung. May  be a small component of CHF contributing.   Plan Continue prednisone  Continue to wean oxygen for SpO2 goal 88-95% (10L [place] HFNC currently).  She was able to slowly wean from yesterday however concerned she may not be able to fully wean due to advanced fibrosis.  Continue IS/flutter valve.  She states these make her feel much better. PT/OT as able. She has low reserves but motivated. No lung biopsy needed for diagnosis so OK to DAPT/anticoagulate as deemed appropriate by cardiology Anemia per primary She is high risk for pulmonary complication for any procedure including endoscopy and cardiac catheterization.  These remain on hold. Long term prognosis is poor, however the patient is very motivated to get through this particular acute phase and hospitalization though she verbalizes understanding of her overall prognosis and diagnoses but wants to continue current treatment plan.  Palliative is following.    PCCM will continue to follow.   Francine Graven, MSN, AGACNP  [facility]

## 2019-03-08 NOTE — Progress Notes (Signed)
Attempted to call family member Carla Drape [phone]) again today. No reply. No voicemail left.

## 2019-03-08 NOTE — Progress Notes (Signed)
Patient ID: JANEENE SAND, female   DOB: 1951/03/15, 68 y.o.   MRN: [id number]  This NP visited patient at the bedside as a follow up for palliative medicine needs and emotional support.  Patient  remains dyspneic at rest. We again discussed the seriousness of her current medical situation; specific to her high oxygen needs.  Patient verbalizes that "I  Understand my conditions" and  the  ongoing medical complications and treatment option decisions. I worry that her insight is limited in understanding viable treatment options to treat her ILD attempting to prolong quality of life.  Patient refuses to engage in any conversation that explores future possible outcomes and anticipatory care needs.  She remains hopeful for improvement and tells me "things are going to be different from now on when I get home"   Discussed with patient the importance of continued conversation with her family and the medical providers regarding overall plan of care and treatment options,  ensuring decisions are within the context of the patients values and GOCs.   Emotional support offered    Questions and concerns addressed    PMT will continue to support holistically   Total time spent on the unit was 20 minutes, discussed with bedside RN  Greater than 50% of the time was spent in counseling and coordination of care  Wadie Lessen NP  Palliative Medicine Team Team Phone # [phone] Pager [phone]

## 2019-03-08 NOTE — Progress Notes (Signed)
Internal Medicine Attending:   I saw and examined the patient. I reviewed the resident's note and I agree with the resident's findings and plan as documented in the resident's note.  Patient states that she feels much better this morning and that her shortness of breath is improving.  Patient's oxygen requirement has decreased to 10 L nasal cannula from 15.  Patient initially met hospital with acute hypoxic respiratory failure and had a CT consistent with ILD likely IPF.  PCCM follow-up and recommendations appreciated.  We will follow up CRP, ACE levels and hypersensitive pneumonitis panel.  We will continue to wean oxygen as tolerated.  Continue with duo nebs.  Steroids have been decreased to prednisone 40 mg daily.  We will continue to monitor the patient at this dose for now.  Patient's hospital course was complicated by acute blood loss anemia secondary to a likely GI bleed.  Patient is not a good candidate for EGD at this time given respiratory status.  Will hold Lovenox, aspirin and Plavix for now.  Patient's hemoglobin is improved to 10.6 after 1 unit PRBC and she has improved symptomatically.  Continue PPI for now.  If patient's hemoglobin remained stable through today would consider starting the patient back on DVT prophylaxis and possibly aspirin.  No further work-up at this time.  Patient also noted to have LAD stenosis of 80%.  Cardiology were initially scheduled to stent this lesion but I am deferred secondary to her acute GI bleed.  We will continue with high intensity statin for now.  We will possibly restart aspirin tomorrow if she remains stable.

## 2019-03-09 ENCOUNTER — Inpatient Hospital Stay (HOSPITAL_COMMUNITY): Payer: Medicare HMO

## 2019-03-09 ENCOUNTER — Telehealth: Payer: Self-pay | Admitting: Emergency Medicine

## 2019-03-09 DIAGNOSIS — J939 Pneumothorax, unspecified: Secondary | ICD-10-CM

## 2019-03-09 DIAGNOSIS — Z9861 Coronary angioplasty status: Secondary | ICD-10-CM | POA: Diagnosis not present

## 2019-03-09 DIAGNOSIS — I272 Pulmonary hypertension, unspecified: Secondary | ICD-10-CM | POA: Diagnosis not present

## 2019-03-09 DIAGNOSIS — D62 Acute posthemorrhagic anemia: Secondary | ICD-10-CM | POA: Diagnosis not present

## 2019-03-09 DIAGNOSIS — J982 Interstitial emphysema: Secondary | ICD-10-CM | POA: Diagnosis not present

## 2019-03-09 DIAGNOSIS — Z4682 Encounter for fitting and adjustment of non-vascular catheter: Secondary | ICD-10-CM | POA: Diagnosis not present

## 2019-03-09 DIAGNOSIS — J9690 Respiratory failure, unspecified, unspecified whether with hypoxia or hypercapnia: Secondary | ICD-10-CM | POA: Diagnosis not present

## 2019-03-09 DIAGNOSIS — I2510 Atherosclerotic heart disease of native coronary artery without angina pectoris: Secondary | ICD-10-CM | POA: Diagnosis not present

## 2019-03-09 DIAGNOSIS — J439 Emphysema, unspecified: Secondary | ICD-10-CM | POA: Diagnosis not present

## 2019-03-09 DIAGNOSIS — I509 Heart failure, unspecified: Secondary | ICD-10-CM | POA: Diagnosis not present

## 2019-03-09 DIAGNOSIS — J449 Chronic obstructive pulmonary disease, unspecified: Secondary | ICD-10-CM | POA: Diagnosis not present

## 2019-03-09 DIAGNOSIS — K922 Gastrointestinal hemorrhage, unspecified: Secondary | ICD-10-CM | POA: Diagnosis not present

## 2019-03-09 DIAGNOSIS — I10 Essential (primary) hypertension: Secondary | ICD-10-CM | POA: Diagnosis not present

## 2019-03-09 DIAGNOSIS — I50810 Right heart failure, unspecified: Secondary | ICD-10-CM | POA: Diagnosis not present

## 2019-03-09 DIAGNOSIS — J9601 Acute respiratory failure with hypoxia: Secondary | ICD-10-CM | POA: Diagnosis not present

## 2019-03-09 LAB — BASIC METABOLIC PANEL
Anion gap: 10 (ref 5–15)
BUN: 20 mg/dL (ref 8–23)
CO2: 26 mmol/L (ref 22–32)
Calcium: 8.2 mg/dL — ABNORMAL LOW (ref 8.9–10.3)
Chloride: 101 mmol/L (ref 98–111)
Creatinine, Ser: 0.4 mg/dL — ABNORMAL LOW (ref 0.44–1.00)
GFR calc Af Amer: >60 mL/min (ref >60)
GFR calc non Af Amer: >60 mL/min (ref >60)
Glucose, Bld: 85 mg/dL (ref 70–99)
Potassium: 3.6 mmol/L (ref 3.5–5.1)
Sodium: 137 mmol/L (ref 135–145)

## 2019-03-09 LAB — CBC WITH DIFFERENTIAL/PLATELET
Abs Immature Granulocytes: 0.35 10*3/uL — ABNORMAL HIGH (ref 0.00–0.07)
Abs Immature Granulocytes: 0.48 10*3/uL — ABNORMAL HIGH (ref 0.00–0.07)
Basophils Absolute: 0.1 10*3/uL (ref 0.0–0.1)
Basophils Absolute: 0.1 10*3/uL (ref 0.0–0.1)
Basophils Relative: 0 %
Basophils Relative: 0 %
Eosinophils Absolute: 0.1 10*3/uL (ref 0.0–0.5)
Eosinophils Absolute: 1.2 10*3/uL — ABNORMAL HIGH (ref 0.0–0.5)
Eosinophils Relative: 0 %
Eosinophils Relative: 7 %
HCT: 33 % — ABNORMAL LOW (ref 36.0–46.0)
HCT: 36.2 % (ref 36.0–46.0)
Hemoglobin: 10.9 g/dL — ABNORMAL LOW (ref 12.0–15.0)
Hemoglobin: 12.2 g/dL (ref 12.0–15.0)
Immature Granulocytes: 2 %
Immature Granulocytes: 3 %
Lymphocytes Relative: 10 %
Lymphocytes Relative: 4 %
Lymphs Abs: 1.9 10*3/uL (ref 0.7–4.0)
Lymphs Abs: 0.8 10*3/uL (ref 0.7–4.0)
MCH: 30.6 pg (ref 26.0–34.0)
MCH: 31 pg (ref 26.0–34.0)
MCHC: 33 g/dL (ref 30.0–36.0)
MCHC: 33.7 g/dL (ref 30.0–36.0)
MCV: 92.7 fL (ref 80.0–100.0)
MCV: 91.9 fL (ref 80.0–100.0)
Monocytes Absolute: 1.1 10*3/uL — ABNORMAL HIGH (ref 0.1–1.0)
Monocytes Absolute: 0.8 10*3/uL (ref 0.1–1.0)
Monocytes Relative: 6 %
Monocytes Relative: 4 %
Neutro Abs: 15.1 10*3/uL — ABNORMAL HIGH (ref 1.7–7.7)
Neutro Abs: 15.5 10*3/uL — ABNORMAL HIGH (ref 1.7–7.7)
Neutrophils Relative %: 82 %
Neutrophils Relative %: 82 %
Platelets: 302 10*3/uL (ref 150–400)
Platelets: 341 10*3/uL (ref 150–400)
RBC: 3.56 MIL/uL — ABNORMAL LOW (ref 3.87–5.11)
RBC: 3.94 MIL/uL (ref 3.87–5.11)
RDW: 13.4 % (ref 11.5–15.5)
RDW: 13.3 % (ref 11.5–15.5)
WBC: 18.6 10*3/uL — ABNORMAL HIGH (ref 4.0–10.5)
WBC: 18.9 10*3/uL — ABNORMAL HIGH (ref 4.0–10.5)
nRBC: 0 % (ref 0.0–0.2)
nRBC: 0 % (ref 0.0–0.2)

## 2019-03-09 LAB — ANGIOTENSIN CONVERTING ENZYME: Angiotensin-Converting Enzyme: 20 U/L (ref 14–82)

## 2019-03-09 LAB — GLUCOSE, CAPILLARY
Glucose-Capillary: 114 mg/dL — ABNORMAL HIGH (ref 70–99)
Glucose-Capillary: 163 mg/dL — ABNORMAL HIGH (ref 70–99)
Glucose-Capillary: 94 mg/dL (ref 70–99)

## 2019-03-09 MED ORDER — HYDROMORPHONE HCL 1 MG/ML IJ SOLN
0.5000 mg | Freq: Once | INTRAMUSCULAR | Status: AC
  Administered 2019-03-09: 0.5 mg via INTRAVENOUS
  Filled 2019-03-09: qty 0.5

## 2019-03-09 MED ORDER — ASPIRIN 81 MG PO CHEW
81.0000 mg | CHEWABLE_TABLET | Freq: Every day | ORAL | Status: DC
  Administered 2019-03-10 – 2019-03-11 (×2): 81 mg via ORAL
  Filled 2019-03-09 (×2): qty 1

## 2019-03-09 NOTE — Progress Notes (Signed)
Physical Therapy Treatment Patient Details Name: Julia Massey MRN: [id number] DOB: 12/28/1950 Today's Date: 03/09/2019    History of Present Illness Pt is a 68 y/o female admitted 02/19/2019 with acute hypoxic respiratory failure likely secondary to acute pneumonitis. Pt placed on BiPap which has since been removed. S/p heart cath on 10/22. Planned stenting 10/27 cancelled due to anemia. PMH includes COPD, gout.   PT Comments    Pt s/p chest tube insertion, motivated to get up to chair. Able to amb short distance in room with HHA on HFNC. Pt still with DOE after minimal activity, but this is much improved compared to prior session. Pt requires seated rest, but continues to require prolonged seated rest. Will continue to follow acutely.    Follow Up Recommendations  Home health PT;Supervision for mobility/OOB(pending progression)     Equipment Recommendations  Rolling walker with 5" wheels    Recommendations for Other Services       Precautions / Restrictions Precautions Precautions: Fall;Other (comment) Precaution Comments: HFNC, chest tube Restrictions Weight Bearing Restrictions: No    Mobility  Bed Mobility Overal bed mobility: Modified Independent       Supine to sit: Modified independent (Device/Increase time);HOB elevated     General bed mobility comments: HOB elevated, some discomfort with chest tube  Transfers Overall transfer level: Needs assistance Equipment used: 1 person hand held assist Transfers: Sit to/from Stand Sit to Stand: Min assist         General transfer comment: MinA for HHA to steady  Ambulation/Gait Ambulation/Gait assistance: Min assist Gait Distance (Feet): 5 Feet Assistive device: 1 person hand held assist Gait Pattern/deviations: Step-to pattern Gait velocity: Decreased   General Gait Details: Amb short distance with minA for HHA, DOE 3/4 with SpO2 maintaining >88% on HFNC; seated rest. Further mobility limited by RT present  for breathing treatment   Stairs             Wheelchair Mobility    Modified Rankin (Stroke Patients Only)       Balance Overall balance assessment: Needs assistance Sitting-balance support: No upper extremity supported;Feet supported Sitting balance-Leahy Scale: Good       Standing balance-Leahy Scale: Poor Standing balance comment: Reliant on UE support for balance this session                            Cognition Arousal/Alertness: Awake/alert Behavior During Therapy: WFL for tasks assessed/performed Overall Cognitive Status: Within Functional Limits for tasks assessed                                        Exercises      General Comments        Pertinent Vitals/Pain Pain Assessment: Faces Faces Pain Scale: Hurts a little bit Pain Location: Chest tube insertion site Pain Descriptors / Indicators: Discomfort Pain Intervention(s): Monitored during session    Home Living                      Prior Function            PT Goals (current goals can now be found in the care plan section) Progress towards PT goals: Progressing toward goals    Frequency    Min 3X/week      PT Plan Current plan remains appropriate    Co-evaluation  AM-PAC PT "6 Clicks" Mobility   Outcome Measure  Help needed turning from your back to your side while in a flat bed without using bedrails?: None Help needed moving from lying on your back to sitting on the side of a flat bed without using bedrails?: None Help needed moving to and from a bed to a chair (including a wheelchair)?: A Little Help needed standing up from a chair using your arms (e.g., wheelchair or bedside chair)?: A Little Help needed to walk in hospital room?: A Little Help needed climbing 3-5 steps with a railing? : A Little 6 Click Score: 20    End of Session Equipment Utilized During Treatment: Oxygen Activity Tolerance: Patient tolerated  treatment well;Patient limited by fatigue Patient left: in chair;with call bell/phone within reach;Other (comment)(with RT) Nurse Communication: Mobility status PT Visit Diagnosis: Muscle weakness (generalized) (M62.81);Difficulty in walking, not elsewhere classified (R26.2)     Time: 2376-2831 PT Time Calculation (min) (ACUTE ONLY): 15 min  Charges:  $Therapeutic Activity: 8-22 mins                    Ina Homes, PT, DPT Acute Rehabilitation Services  Pager [phone] Office [phone]  Malachy Chamber 03/09/2019, 1:48 PM

## 2019-03-09 NOTE — Care Management Important Message (Signed)
Important Message  Patient Details  Name: NORELY SCHLICK MRN: [id number] Date of Birth: 1950/09/02   Medicare Important Message Given:  Yes     [name] 03/09/2019, 12:54 PM

## 2019-03-09 NOTE — Telephone Encounter (Signed)
Received call report from Dr. Thornton Papas, radiologist.  This is a patient that is inpatient and he wanted to notify Dr. Lamonte Sakai who is here in the office this morning.  Imaging showing unsuspected findings including left pneumo.   Routing to Dr. Lamonte Sakai to review imaging.

## 2019-03-09 NOTE — Progress Notes (Addendum)
NAME:  Julia Massey, MRN:  [id number], DOB:  [birth year], LOS: 39 ADMISSION DATE:  03/08/2019, CONSULTATION DATE:  02/19/19 REFERRING MD:  Valli Glance IMTS, CHIEF COMPLAINT:  Acute on chronic respiratory failure  Brief History   68 year old with COPD, diastolic heart failure presenting with acute hypoxic respiratory failure on high flow nasal cannula Work-up consists of connective tissue serology showing elevated rheumatoid factor.  She had been told as an outpatient that she may have rheumatoid arthritis versus gout She is an ex-smoker, quit in 2017.  Used to work as a Office manager.  No known exposures with no birds or feather pillows or comforters or mold exposure  Past Medical History  COPD Carpal Tunnel Chronic back pain Gout HLD Diastolic heart dysfunction   Significant Hospital Events   10/11 admitted  10/12 PCCM consulted.  10/21 - says not better. On 10LNC at rest and as she talks pulse ox is 88%. Says pre-admit was not on o2 but doing well. Says brother in [place] who is 55 years older Yetta Flock) also diagnosed with pulmonary fibrosis in feb 2020. She wants me to call him. Chart review shows RV strain + but she does not have dema.  Chart review shows essentially negative serology  (RF is < 2x ULN). HRCT shows UIP wiuth GGO - pretty c/w UIP flare  10/22  - Relatively normal RHC and no rv failure. 80% LAD stenosis. Start solumedrol 125mg  IV q6h. Started higher dose IV steroids (Solumedrol 125 q6 hrs)  10/23 -  s/p cardiac MRI. Results pending. Cards prefers to see course with steroids and results of cardiac MRI before deciding on stent.   10/27 - plan for LHC today, but had 4gm drop in hgb so procedure on hold.   10/30 chest tube on left placed for persistent PTX and evolving pneumomediastinum    Consults:  PCCM GI  Procedures:   Significant Diagnostic Tests:  10/12 CT angio chest> No PE. enlarged heart, no pericardial effusion. Diffuse ground-glass  appearance throughout lungs. No Pleural Effusion. Emphysematous changes.   10/19 CT high-resolution> Basilar predominant fibrosis honeycombing.  Definite UIP pattern. Reviewed images personally.  10/27 CT abdomen> No acute CT finding, specifically with no evidence of hematoma.  Micro Data:  10/11 SARS CoV2> neg  10/12 RVP neg  Antimicrobials:  Azithroymycin 10/11-10/13 Rocephin 10/11-10/13  Interim history/subjective:  Resting in bed no distress.   Objective   Blood pressure 119/64, pulse 82, temperature 97.9 F (36.6 C), temperature source Oral, resp. rate 18, height 5\' 6"  (1.676 m), weight 48.2 kg, SpO2 93 %.        Intake/Output Summary (Last 24 hours) at 03/09/2019 1525 Last data filed at 03/09/2019 0530 Gross per 24 hour  Intake no documentation  Output 900 ml  Net -900 ml   Filed Weights   03/07/19 0500 03/08/19 0530 03/09/19 0213  Weight: 45.9 kg 48.1 kg 48.2 kg    General this is a 68 year old female resting in bed s/p chest tube HENT NCAT no JVD pulm crackles both bases. Left chest tube w/ transient 1/7 air leak and excellent tidal Card RRR abd not tender + bowel sounds Neuro intact Ext brisk CR    Resolved Hospital Problem list     Assessment & Plan:   Bilateral ground glass pulmonary infiltrates w/ associated hypoxic respiratory failure .  Persistent apical Left PTX w/ pneumomediastinum.  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  Clinically still requiring high flow oxygen   pcxr personally reviewed. Bilateral pulm infiltrates. Left PTX essentially resolved s/p ct placement   Plan Cont prednisone CT placement and place to suction w/ daily CXR and air leak monitoring Wean oxygen  Mobilize Diuresis as able   Not sure there is much else to offer    PCCM will continue to follow.   Simonne Martinet ACNP-BC [facility] Pulmonary/Critical Care Pager # [phone] OR # [phone] if no answer

## 2019-03-09 NOTE — Progress Notes (Signed)
   Patient's clinical course continues to deteriorate. Not candidate for PCI at this point. The HF team will sign out. Please call me with any questions.   Glori Bickers, MD  9:00 AM

## 2019-03-09 NOTE — Progress Notes (Signed)
Physical Therapy Treatment Patient Details Name: PRINCETTA UPLINGER MRN: [id number] DOB: Jan 07, 1951 Today's Date: 03/09/2019    History of Present Illness Pt is a 68 y/o female admitted 2019/03/08 with acute hypoxic respiratory failure likely secondary to acute pneumonitis. Pt placed on BiPap which has since been removed. S/p heart cath on 10/22. Planned stenting 10/27 cancelled due to anemia. Pt with L pneumothorax s/p chest tube 10/30. PMH includes COPD, gout.   PT Comments    Pt able to tolerate increased amount of shorter bouts of activity with RW at supervision-level. SpO2 85-90% on 9L O2 HFNC with activity. Pt with increased discomfort at chest tube site. Discussed d/c planning; pt reports she will have 24/7 assist at home, will need RW and BSC. Continue to recommend HHPT services.     Follow Up Recommendations  Home health PT;Supervision for mobility/OOB     Equipment Recommendations  Rolling walker with 5" wheels;3in1 (PT)    Recommendations for Other Services       Precautions / Restrictions Precautions Precautions: Fall;Other (comment) Precaution Comments: HFNC, L chest tube Restrictions Weight Bearing Restrictions: No    Mobility  Bed Mobility Overal bed mobility: Modified Independent       Supine to sit: Modified independent (Device/Increase time);HOB elevated     General bed mobility comments: Sitting in recliner, very uncomfortable with chest tub  Transfers Overall transfer level: Needs assistance Equipment used: Rolling walker (2 wheeled) Transfers: Sit to/from Stand Sit to Stand: Supervision         General transfer comment: Supervision to stand from recliner to RW, heavy reliance on BUE support to push on armrests  Ambulation/Gait Ambulation/Gait assistance: Supervision Gait Distance (Feet): 5 Feet Assistive device: Rolling walker (2 wheeled) Gait Pattern/deviations: Step-to pattern Gait velocity: Decreased   General Gait Details: Multiple bouts  of marching in place with RW, pt only able to tolerate ~10 sec marching before standing rest break, limited by SOB and c/o pain at chest tube insertion. SpO2 down to 85% on 9L O2 HFNC requiring seated rest to return to 88-90%   Stairs             Wheelchair Mobility    Modified Rankin (Stroke Patients Only)       Balance Overall balance assessment: Needs assistance Sitting-balance support: No upper extremity supported;Feet supported Sitting balance-Leahy Scale: Good       Standing balance-Leahy Scale: Fair Standing balance comment: Can static stand without UE support; dynamic stability improved with BUE support                            Cognition Arousal/Alertness: Awake/alert Behavior During Therapy: WFL for tasks assessed/performed Overall Cognitive Status: Within Functional Limits for tasks assessed                                        Exercises      General Comments General comments (skin integrity, edema, etc.): Discussed d/c planning. Pt planning to return home with 24/7 assist from friends/family; her bed is being moved to main room with bathroom very close, will need RW, BSC and HH services      Pertinent Vitals/Pain Pain Assessment: Faces Faces Pain Scale: Hurts little more Pain Location: Chest tube insertion site Pain Descriptors / Indicators: Discomfort;Grimacing;Guarding Pain Intervention(s): Patient requesting pain meds-RN notified;Repositioned    Home Living  Prior Function            PT Goals (current goals can now be found in the care plan section) Progress towards PT goals: Progressing toward goals    Frequency    Min 3X/week      PT Plan Equipment recommendations need to be updated    Co-evaluation              AM-PAC PT "6 Clicks" Mobility   Outcome Measure  Help needed turning from your back to your side while in a flat bed without using bedrails?: None Help  needed moving from lying on your back to sitting on the side of a flat bed without using bedrails?: None Help needed moving to and from a bed to a chair (including a wheelchair)?: A Little Help needed standing up from a chair using your arms (e.g., wheelchair or bedside chair)?: A Little Help needed to walk in hospital room?: A Little Help needed climbing 3-5 steps with a railing? : A Little 6 Click Score: 20    End of Session Equipment Utilized During Treatment: Oxygen Activity Tolerance: Patient tolerated treatment well;Patient limited by fatigue Patient left: in chair;with call bell/phone within reach Nurse Communication: Mobility status PT Visit Diagnosis: Muscle weakness (generalized) (M62.81);Difficulty in walking, not elsewhere classified (R26.2)     Time: 8563-1497 PT Time Calculation (min) (ACUTE ONLY): 20 min  Charges:  $Therapeutic Exercise: 8-22 mins                    Mabeline Caras, PT, DPT Acute Rehabilitation Services  Pager [phone] Office [place] 03/09/2019, 3:20 PM

## 2019-03-09 NOTE — Progress Notes (Addendum)
03/09/2019 9:30PM Paged by RN regarding patient complaining of pain at site of chest tube and tachypnea. Patient assessed at bedside. She appears uncomfortable and notes not being able to find a position of comfort due to pain at site of chest tube. Patient is saturating 90-94% on 7L HFNC. Chest tube in place with surrounding dressing intact. No warmth or erythema noted. No drainage from site. Rales in all lung fields appreciated on lung auscultation which appears to be present due to her ongoing IPF. Suspect that patient's tachypnea and discomfort are secondary to pain. However, given recent placement of chest tube and increased pain, will obtain CXR. Will give dilaudid 0.5mg  and reassess.  - Continue to monitor.   03/10/2019 12:25PM CXR with no significant change in the small left pneumothorax but did have increased subcutaneous emphysema in the upper thorax and neck soft tissues. Patient assessed at bedside. She remains slightly tachypnic but no longer complaining of pain at site or any other discomfort. Patient is saturating 92-94% on 7L HFNC. Physical exam unchanged from prior. She is noted to have crepitus in neck. Patient's CXR findings and status discussed with PCCM. Recommended to continue monitoring at this time.   03/10/2019 3:45AM Patient assessed at bedside. She is resting comfortably. She continues to saturate >92% on 7L HFNC, RR 20 and HR 85. Will continue to monitor.

## 2019-03-09 NOTE — Progress Notes (Signed)
Internal Medicine Attending:   I saw and examined the patient. I reviewed the resident's note and I agree with the resident's findings and plan as documented in the resident's note.  Patient states that she feels okay today and that her shortness of breath is slowly improving.  Patient initially made to the hospital with acute hypoxemic respiratory failure likely secondary to ILD possibly IPF.  Patient had repeat chest x-ray done today which showed pneumomediastinum as well as new left pneumothorax.  Case discussed with PCCM.  Stat CT chest ordered which showed moderate pneumomediastinum as well as a small left pneumothorax and persistent bilateral infiltrates and fibrosis.  PCCM follow-up and recommendations appreciated.  Chest tube placed today for left pneumothorax.  We will continue with current dose of steroids for now for likely ILD.  We will continue to try to wean patient off oxygen.  Currently she is on 7 L high flow nasal cannula.  No further work-up at this time.  Patient's prognosis remains guarded.  She is currently a full code.  Patient also had her hospital course complicated by an acute GI bleed.  Patient's hemoglobin remained stable status post 1 unit PRBC.  Continue with IV PPI today.  Will consider restarting aspirin tomorrow.  No further work-up at this time.

## 2019-03-09 NOTE — Procedures (Cosign Needed)
Chest Tube Insertion Procedure Note  Indications:  Clinically significant Pneumothorax  Pre-operative Diagnosis: Pneumothorax  Post-operative Diagnosis: Pneumothorax  Procedure Details  Informed consent was obtained for the procedure, including sedation.  Risks of lung perforation, hemorrhage, arrhythmia, and adverse drug reaction were discussed.   After sterile skin prep, using standard technique, a 14 French tube was placed in the left lateral 7th rib space.  Findings: None and excellent tidal. Airleak resolved after 5-6 respiratory cycles.   Estimated Blood Loss:  Minimal         Specimens:  None              Complications:  None; patient tolerated the procedure well.         Disposition: medsurg         Condition: stable  Erick Colace ACNP-BC [place] Pager # [phone] OR # [phone] if no answer

## 2019-03-09 NOTE — Progress Notes (Signed)
PT Cancellation Note  Patient Details Name: MYLAH BAYNES MRN: [id number] DOB: July 06, 1950   Cancelled Treatment:    Reason Eval/Treat Not Completed: Patient at procedure or test/unavailable, to IR for chest tube. Will follow-up for PT treatment as schedule permits.  Mabeline Caras, PT, DPT Acute Rehabilitation Services  Pager [phone] Office [place] 03/09/2019, 12:36 PM

## 2019-03-09 NOTE — Progress Notes (Signed)
IR consulted for chest tube placement in known small left pneumothorax.  To bedside to discuss with patient.  MD states this is now to be done with PCCM.   Radiology order canceled.   Brynda Greathouse, MS RD PA-C

## 2019-03-09 NOTE — Progress Notes (Signed)
Subjective:    Julia Massey reports she continues to feel okay. No acute worsening in SOB.  We discussed the results of her chest xray and plan to obtain additional imaging.   Objective:  Vital signs in last 24 hours: Vitals:   03/09/19 0213 03/09/19 0742 03/09/19 0829 03/09/19 0835  BP:   93/61 119/64  Pulse:  91 98   Resp:  18 (!) 23   Temp:   97.9 F (36.6 C)   TempSrc:   Oral   SpO2:  94% (!) 89%   Weight: 48.2 kg     Height:        Physical Exam Vitals signs and nursing note reviewed.  Constitutional:      General: She is not in acute distress.    Appearance: She is well-developed. She is not toxic-appearing.  Cardiovascular:     Rate and Rhythm: Regular rhythm. Tachycardia present.     Heart sounds: No murmur.  Pulmonary:     Effort: No tachypnea, accessory muscle usage or respiratory distress.     Breath sounds: Rales (bilateral in all lung fields, most severe bibasilar. Dry in nature, remains unchanged from previous examination) present. No decreased breath sounds, wheezing or rhonchi.  Musculoskeletal:     Right lower leg: She exhibits no tenderness. No edema.     Left lower leg: She exhibits no tenderness. No edema.  Skin:    General: Skin is warm and dry.     Findings: No ecchymosis or rash.  Neurological:     General: No focal deficit present.     Mental Status: She is alert and oriented to person, place, and time.  Psychiatric:        Behavior: Behavior normal.    Assessment/Plan:  Active Problems:   DNR (do not resuscitate) discussion   Interstitial lung disease (HCC)   Acute respiratory failure with hypoxemia (HCC)   Palliative care by specialist   Weakness generalized   Dyspnea   Protein-calorie malnutrition, severe   Pulmonary infiltrates  # Acute hypoxic respiratory failure  CTA on 10/12 was notable for bibasilar honeycombing, diffuse ground glass opacities, enlarged right ventricle and pulmonary arteries consistent with pulmonary artery  hypertension and chronic changes consistent with interstitial lung disease. PCCM was consulted and noted possible ILD. Rheumatologic workup notable for elevated sed rate and positive RF, rest of serology were negative including CCP. High-resolution CT on 10/19 showed significant ground glass opacities and findings consistent with UIP with possible overlying edema vs UIP flare. 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 Despite the lack of mortality benefit and possible harm, patient will require steroids to maintain her pulmonary function.   Chest xray this morning demonstrated a new left pneumothorax and pneumomediastinum. CT was ordered by PCCM for further clarification. Results show a moderate pneumomediastinum with small pneumothorax, as seen on xray. PCCM plans to insert a chest tube to alleviate some of the pressure.   Forunately, patient has remained hemodynamically stable and is able to wean down on HFNC to 8L today.   - PCCM on board and we appreciate their recommendations - Telemetry - HFNC, maintain saturation above >80% - Wean as tolerated - Duoneb TID  - Prednisone 40mg  QD  - Chest tube per PCCM  # Acute GI Bleed Given acute drop in hemoglobin and positive fecal hemoccult on 10/27, GI was consulted. Unfortunately, they do not think she would be a good candidate for EGD given respiratory status, but would reapproach the topic  if she becomes transfusion dependent or has massive melena or hematochezia. Hemoglobin has remained stable since receiving 1 unit of pRBCs. Hopeful this indicates acute bleed has resolved. Plan to continue IV Protonix for at least another 2-3 days, before switching to PO. Will restart Aspirin tomorrow instead of today since she has having a procedure today.   - Protonix 40mg  IV BID  - Hold Lovenox and Plavix for now - Restart Aspirin 81mg  QD tomorrow - CBC every 12 hours   # Pulmonary Hypertension # Right Heart Failure  Last echo in 2017  showed grade 1 diastolic dysfunction with preserved EF at 55-60%. BNP on this admission was 1170. TTE on 10/12 showed right ventricle has severely reduced systolic function with moderate pulmonary hypertension. Repeat TTE on 10/19 showed continued right heart failure but not as severe as prior. Heart cath on 10/22 did not show significant pulmonary htn or right heart strain. cMRI showed some hypokinesis at the RV base with dyskinesis at RV apex. Dr. Haroldine Laws stated in his note he will follow up these results with the imaging team as it is unusual morphology. No additional intervention required at this time, will continue to monitor.   Stable. Given her prognosis continues to decline with more complications, HF has signed off, as she is no longer a candidate for PCI.   - Telemetry - Strict I/Os  - Daily weights  # LAD Stenosis:  Coronary angio on 10/22 showed a 80% stenosis of the LAD. No longer a PCI candidate. fMedical management at this time.   - Aspirin and Plavix (holding for now though) - Atorvastatin 80 mg QD  #Elevated BP:  Improved. No intervention necessary at this time. Continue to monitor.     Dispo: Anticipated discharge pending clinical improvement.   Dr. Jose Persia Internal Medicine PGY-1  Pager: [phone] 03/09/2019, 11:12 AM

## 2019-03-09 NOTE — Telephone Encounter (Signed)
The hospital team is aware - hospitalist spoke with Dr Loanne Drilling

## 2019-03-10 ENCOUNTER — Inpatient Hospital Stay (HOSPITAL_COMMUNITY): Payer: Medicare HMO

## 2019-03-10 DIAGNOSIS — J982 Interstitial emphysema: Secondary | ICD-10-CM

## 2019-03-10 DIAGNOSIS — J849 Interstitial pulmonary disease, unspecified: Secondary | ICD-10-CM | POA: Diagnosis not present

## 2019-03-10 DIAGNOSIS — J9312 Secondary spontaneous pneumothorax: Secondary | ICD-10-CM | POA: Diagnosis not present

## 2019-03-10 DIAGNOSIS — J9601 Acute respiratory failure with hypoxia: Secondary | ICD-10-CM | POA: Diagnosis not present

## 2019-03-10 DIAGNOSIS — Z789 Other specified health status: Secondary | ICD-10-CM | POA: Diagnosis not present

## 2019-03-10 DIAGNOSIS — Z4682 Encounter for fitting and adjustment of non-vascular catheter: Secondary | ICD-10-CM | POA: Diagnosis not present

## 2019-03-10 DIAGNOSIS — R0609 Other forms of dyspnea: Secondary | ICD-10-CM | POA: Diagnosis not present

## 2019-03-10 DIAGNOSIS — J939 Pneumothorax, unspecified: Secondary | ICD-10-CM | POA: Diagnosis not present

## 2019-03-10 DIAGNOSIS — A419 Sepsis, unspecified organism: Secondary | ICD-10-CM | POA: Diagnosis not present

## 2019-03-10 LAB — GLUCOSE, CAPILLARY
Glucose-Capillary: 191 mg/dL — ABNORMAL HIGH (ref 70–99)
Glucose-Capillary: 202 mg/dL — ABNORMAL HIGH (ref 70–99)
Glucose-Capillary: 85 mg/dL (ref 70–99)
Glucose-Capillary: 89 mg/dL (ref 70–99)

## 2019-03-10 LAB — CBC
HCT: 34.9 % — ABNORMAL LOW (ref 36.0–46.0)
Hemoglobin: 11.6 g/dL — ABNORMAL LOW (ref 12.0–15.0)
MCH: 30.9 pg (ref 26.0–34.0)
MCHC: 33.2 g/dL (ref 30.0–36.0)
MCV: 93.1 fL (ref 80.0–100.0)
Platelets: 321 10*3/uL (ref 150–400)
RBC: 3.75 MIL/uL — ABNORMAL LOW (ref 3.87–5.11)
RDW: 13.4 % (ref 11.5–15.5)
WBC: 18.8 10*3/uL — ABNORMAL HIGH (ref 4.0–10.5)
nRBC: 0 % (ref 0.0–0.2)

## 2019-03-10 LAB — BASIC METABOLIC PANEL
Anion gap: 12 (ref 5–15)
BUN: 20 mg/dL (ref 8–23)
CO2: 28 mmol/L (ref 22–32)
Calcium: 8.6 mg/dL — ABNORMAL LOW (ref 8.9–10.3)
Chloride: 96 mmol/L — ABNORMAL LOW (ref 98–111)
Creatinine, Ser: 0.52 mg/dL (ref 0.44–1.00)
GFR calc Af Amer: >60 mL/min (ref >60)
GFR calc non Af Amer: >60 mL/min (ref >60)
Glucose, Bld: 88 mg/dL (ref 70–99)
Potassium: 4 mmol/L (ref 3.5–5.1)
Sodium: 136 mmol/L (ref 135–145)

## 2019-03-10 LAB — HAPTOGLOBIN: Haptoglobin: 255 mg/dL (ref 37–355)

## 2019-03-10 MED ORDER — HYDROCODONE-ACETAMINOPHEN 7.5-325 MG PO TABS
1.0000 | ORAL_TABLET | Freq: Once | ORAL | Status: AC
  Administered 2019-03-10: 1 via ORAL

## 2019-03-10 MED ORDER — HYDROCODONE-ACETAMINOPHEN 7.5-325 MG PO TABS
1.0000 | ORAL_TABLET | Freq: Three times a day (TID) | ORAL | Status: DC
  Administered 2019-03-11: 1 via ORAL
  Filled 2019-03-10 (×3): qty 1

## 2019-03-10 MED ORDER — HYDROMORPHONE HCL 1 MG/ML IJ SOLN
0.5000 mg | Freq: Four times a day (QID) | INTRAMUSCULAR | Status: DC | PRN
  Administered 2019-03-10 – 2019-03-12 (×5): 0.5 mg via INTRAVENOUS
  Filled 2019-03-10 (×5): qty 0.5

## 2019-03-10 NOTE — Progress Notes (Signed)
Pt expressed anxiety and increasing need for pain medications.  Episodes of desaturation down to 83 %, tachypnea 31f 22 and tachycardia of 102 triggers a MEWS of 2.   Notified provider Dr. Charleen Kirks, MD. Provider to place break through pain medication, and anti-anxiety medication, with desaturation episodes to be managed by increasing HFNC rate up to 15 L.  Currently at [place] while awaiting orders for breakthrough paiun medication and anti-anxiety medication.

## 2019-03-10 NOTE — Progress Notes (Signed)
Patient seen by MD at 0800 this am.  Per provider, VS q3 hrs due to presence of small amount of subQ air hear on lun auscultation.  Per MD, pt stable at this time unless there is increased dyspnea and is appropriate for a CHF floor at this time.

## 2019-03-10 NOTE — Plan of Care (Signed)
  Problem: Health Behavior/Discharge Planning: Goal: Ability to manage health-related needs will improve Outcome: Not Progressing   Problem: Clinical Measurements: Goal: Respiratory complications will improve Outcome: Not Progressing   

## 2019-03-10 NOTE — Progress Notes (Signed)
NAME:  Julia Massey, MRN:  [id number], DOB:  Jun 22, 1950, LOS: 20 ADMISSION DATE:  03/03/2019, CONSULTATION DATE:  02/19/19 REFERRING MD:  Daron Offer IMTS, CHIEF COMPLAINT:  Acute on chronic respiratory failure  Brief History   68 year old with COPD, diastolic heart failure presenting with acute hypoxic respiratory failure on high flow nasal cannula Work-up consists of connective tissue serology showing elevated rheumatoid factor.  She had been told as an outpatient that she may have rheumatoid arthritis versus gout She is an ex-smoker, quit in 2017.  Used to work as a Air cabin crew.  No known exposures with no birds or feather pillows or comforters or mold exposure  Past Medical History  COPD Carpal Tunnel Chronic back pain Gout HLD Diastolic heart dysfunction   Significant Hospital Events   10/11 admitted  10/12 PCCM consulted.  10/21 - says not better. On 10LNC at rest and as she talks pulse ox is 88%. Says pre-admit was not on o2 but doing well. Says brother in [place] who is 6 years older Brooke Bonito) also diagnosed with pulmonary fibrosis in feb 2020. She wants me to call him. Chart review shows RV strain + but she does not have dema.  Chart review shows essentially negative serology  (RF is < 2x ULN). HRCT shows UIP wiuth GGO - pretty c/w UIP flare  10/22  - Relatively normal RHC and no rv failure. 80% LAD stenosis. Start solumedrol 125mg  IV q6h. Started higher dose IV steroids (Solumedrol 125 q6 hrs)  10/23 -  s/p cardiac MRI. Results pending. Cards prefers to see course with steroids and results of cardiac MRI before deciding on stent.   10/27 - plan for LHC today, but had 4gm drop in hgb so procedure on hold.   10/30 chest tube on left placed for persistent PTX and evolving pneumomediastinum    Consults:  PCCM GI  Procedures:  Left chest tube 10/30>>  Significant Diagnostic Tests:  10/12 CT angio chest> No PE. enlarged heart, no pericardial effusion.  Diffuse ground-glass appearance throughout lungs. No Pleural Effusion. Emphysematous changes.   10/19 CT high-resolution> Basilar predominant fibrosis honeycombing.  Definite UIP pattern. Reviewed images personally.  10/27 CT abdomen> No acute CT finding, specifically with no evidence of hematoma.  Micro Data:  10/11 SARS CoV2> neg  10/12 RVP neg  Antimicrobials:  Azithroymycin 10/11-10/13 Rocephin 10/11-10/13  Interim history/subjective:  S/p left chest tube placement. Unchanged left PTX and pneumomediastinum on CXR. Remains on 7-8L on [place]. Reports left chest discomfort at tube insertion site  Objective   Blood pressure 111/66, pulse 100, temperature 98.2 F (36.8 C), temperature source Oral, resp. rate 20, height 5\' 6"  (1.676 m), weight 45.5 kg, SpO2 90 %.        Intake/Output Summary (Last 24 hours) at 03/10/2019 1147 Last data filed at 03/10/2019 0909 Gross per 24 hour  Intake 120 ml  Output 110 ml  Net 10 ml   Filed Weights   03/08/19 0530 03/09/19 0213 03/10/19 0530  Weight: 48.1 kg 48.2 kg 45.5 kg   Physical Exam: General: Thin, elderly, chronically ill-appearing, no acute distress HENT: [place], AT, OP clear, MMM Eyes: EOMI, no scleral icterus Respiratory: Diminished breath sounds with crackles throughout. Cardiovascular: RRR, -M/R/G, no JVD GI: BS+, soft, nontender Extremities:-Edema,-tenderness Neuro: AAO x4, CNII-XII grossly intact  CXR 03/10/19 - Unchanged small left PTX and pneumomediastinum on CXR. Left chest tube in place  Resolved Hospital Problem list     Assessment & Plan:  Bilateral ground glass pulmonary infiltrates w/ associated hypoxic respiratory failure .  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 The cause of ILD is unclear however this may  be related to fibrotic progression of her prior ARDS. She respiratory failure work-up has included cardiac evaluation which identified 80% LAD lesion (PCI planned for when respiratory status improves) and reduced RV output. Echocardiogram noted RVSP 53.8 suggestive of pulmonary hypertension however cardiac cath did not show significant PAH. She is being treated with steroids and diuresis. She has been weaned to 7L O2 which is an improvement. We have discussed her ILD course and possible progression which will likely result in being discharged on oxygen. 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 I am concerned about her lung's ability to heal from the pneumothorax but we will continue current chest tube management and see how she does.   She continues to desat with movement and speech. Remains on 7L. CXR reviewed with small pneumothorax and pneumomediastinum unchanged.   Plan Continue chest tube on suction -20 Daily CXR Cont prednisone 40 mg daily  Wean oxygen as able for goal SpO2 88-92% OOB as tolerated  Pulmonary will continue to follow  Rodman Pickle, M.D. [facility] Pulmonary/Critical Care Medicine 03/10/2019 4:34 PM  Pager: [phone] After hours pager: [phone]

## 2019-03-10 NOTE — Progress Notes (Signed)
Subjective:    Julia Massey reports she had a rough night last night with the chest tube as it is quite painful. The dilaudid helps, so she did ask for 1 additional dose. This morning, her pain is well controlled as long as she doesn't move around too much. She denies increased SOB.   Objective:  Vital signs in last 24 hours: Vitals:   03/10/19 0131 03/10/19 0201 03/10/19 0231 03/10/19 0530  BP: (!) 128/91 110/74 133/71 118/73  Pulse: 88 89 94 85  Resp:      Temp:    97.6 F (36.4 C)  TempSrc:    Oral  SpO2: 95% 92% 96% 94%  Weight:    45.5 kg  Height:        Physical Exam Vitals signs and nursing note reviewed.  Constitutional:      General: She is not in acute distress.    Appearance: She is well-developed. She is not toxic-appearing.  Cardiovascular:     Rate and Rhythm: Normal rate and regular rhythm.     Heart sounds: No murmur.  Pulmonary:     Effort: No tachypnea, accessory muscle usage or respiratory distress.     Breath sounds: Rales (bilateral in all lung fields, most severe bibasilar. Dry in nature, remains unchanged from previous examination) present. No decreased breath sounds, wheezing or rhonchi.  Musculoskeletal:     Right lower leg: She exhibits no tenderness. No edema.     Left lower leg: She exhibits no tenderness. No edema.  Skin:    General: Skin is warm and dry.     Findings: No ecchymosis or rash.     Comments: Subcutaneous emphysema on left neck only, none around chest tube site.   Neurological:     General: No focal deficit present.     Mental Status: She is alert and oriented to person, place, and time.  Psychiatric:        Behavior: Behavior normal.    Assessment/Plan:  Active Problems:   DNR (do not resuscitate) discussion   Interstitial lung disease ([place])   Acute respiratory failure with hypoxemia (HCC)   Palliative care by specialist   Weakness generalized   Dyspnea   Protein-calorie malnutrition, severe   Pulmonary infiltrates   Pneumothorax  # Acute hypoxic respiratory failure  CTA on 10/12 was notable for bibasilar honeycombing, diffuse ground glass opacities, enlarged right ventricle and pulmonary arteries consistent with pulmonary artery hypertension and chronic changes consistent with interstitial lung disease. PCCM was consulted and noted possible ILD. Rheumatologic workup notable for elevated sed rate and positive RF, rest of serology were negative including CCP. High-resolution CT on 10/19 showed significant ground glass opacities and findings consistent with UIP with possible overlying edema vs UIP flare. 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 Despite the lack of mortality benefit and possible harm, patient will require steroids to maintain her pulmonary function.   Julia Massey had a chest tube placed on 10/30 due to development of a new pneumo-mediastinum and left pneumothorax, both moderate in size. No right heart stain or tension noted. Overnight, she did have significant pain at tube site, repeat xray shows some subcutaneous air did develop. PCCM was made aware given her fragile state. She remains hemodynamically stable and on HFNC @ 7L.   - PCCM on board and we appreciate their recommendations - Telemetry - HFNC, maintain saturation above >80% - Wean as tolerated - Duoneb TID  - Prednisone 40mg  QD  - Chest tube  on suction  - Daily chest xray  # Acute GI Bleed Given acute drop in hemoglobin and positive fecal hemoccult on 10/27, GI was consulted. Unfortunately, they do not think she would be a good candidate for EGD given respiratory status, but would reapproach the topic if she becomes transfusion dependent or has massive melena or hematochezia. Hemoglobin has remained stable since receiving 1 unit of pRBCs on 10/28. Okay to resume aspirin today, but will continue holding lovenox and plavix. Day 4 of IV Protonix, okay to switch to PO per GI, but will continue IV today.   - Protonix  40mg  IV BID  - Hold Lovenox and Plavix for now - Restart Aspirin 81mg  QD tomorrow - CBC every 12 hours   # Pulmonary Hypertension # Right Heart Failure  Last echo in 2017 showed grade 1 diastolic dysfunction with preserved EF at 55-60%. BNP on this admission was 1170. TTE on 10/12 showed right ventricle has severely reduced systolic function with moderate pulmonary hypertension. Repeat TTE on 10/19 showed continued right heart failure but not as severe as prior. Heart cath on 10/22 did not show significant pulmonary htn or right heart strain. cMRI showed some hypokinesis at the RV base with dyskinesis at RV apex.   Stable. Given her prognosis continues to decline with more complications, HF has signed off, as she is no longer a candidate for PCI.   - Telemetry - Strict I/Os  - Daily weights  # LAD Stenosis:  Coronary angio on 10/22 showed a 80% stenosis of the LAD. No longer a PCI candidate. Medical management at this time.   - Aspirin and Plavix (holding for now though) - Atorvastatin 80 mg QD  #Elevated BP: Resolved    Dispo: Anticipated discharge pending clinical improvement.   Dr. 11/22 Internal Medicine PGY-1  Pager: [phone] 03/10/2019, 7:32 AM

## 2019-03-10 NOTE — Progress Notes (Signed)
Patient's anxiety level is now down and desaturation levels have decreased, currently at 9L HFNC, oxygen saturations 88-90 %.

## 2019-03-11 ENCOUNTER — Inpatient Hospital Stay (HOSPITAL_COMMUNITY): Payer: Medicare HMO

## 2019-03-11 DIAGNOSIS — Z79899 Other long term (current) drug therapy: Secondary | ICD-10-CM | POA: Diagnosis not present

## 2019-03-11 DIAGNOSIS — I5030 Unspecified diastolic (congestive) heart failure: Secondary | ICD-10-CM | POA: Diagnosis not present

## 2019-03-11 DIAGNOSIS — Z9889 Other specified postprocedural states: Secondary | ICD-10-CM | POA: Diagnosis not present

## 2019-03-11 DIAGNOSIS — J849 Interstitial pulmonary disease, unspecified: Secondary | ICD-10-CM | POA: Diagnosis not present

## 2019-03-11 DIAGNOSIS — R0609 Other forms of dyspnea: Secondary | ICD-10-CM | POA: Diagnosis not present

## 2019-03-11 DIAGNOSIS — J939 Pneumothorax, unspecified: Secondary | ICD-10-CM | POA: Diagnosis not present

## 2019-03-11 DIAGNOSIS — Z789 Other specified health status: Secondary | ICD-10-CM | POA: Diagnosis not present

## 2019-03-11 DIAGNOSIS — A419 Sepsis, unspecified organism: Secondary | ICD-10-CM | POA: Diagnosis not present

## 2019-03-11 DIAGNOSIS — I50810 Right heart failure, unspecified: Secondary | ICD-10-CM | POA: Diagnosis not present

## 2019-03-11 DIAGNOSIS — I10 Essential (primary) hypertension: Secondary | ICD-10-CM | POA: Diagnosis not present

## 2019-03-11 DIAGNOSIS — Z4682 Encounter for fitting and adjustment of non-vascular catheter: Secondary | ICD-10-CM | POA: Diagnosis not present

## 2019-03-11 DIAGNOSIS — J982 Interstitial emphysema: Secondary | ICD-10-CM | POA: Diagnosis not present

## 2019-03-11 DIAGNOSIS — J9312 Secondary spontaneous pneumothorax: Secondary | ICD-10-CM | POA: Diagnosis not present

## 2019-03-11 DIAGNOSIS — I272 Pulmonary hypertension, unspecified: Secondary | ICD-10-CM | POA: Diagnosis not present

## 2019-03-11 DIAGNOSIS — J9601 Acute respiratory failure with hypoxia: Secondary | ICD-10-CM | POA: Diagnosis not present

## 2019-03-11 LAB — CBC WITH DIFFERENTIAL/PLATELET
Abs Immature Granulocytes: 0.51 10*3/uL — ABNORMAL HIGH (ref 0.00–0.07)
Basophils Absolute: 0.1 10*3/uL (ref 0.0–0.1)
Basophils Relative: 0 %
Eosinophils Absolute: 1.3 10*3/uL — ABNORMAL HIGH (ref 0.0–0.5)
Eosinophils Relative: 6 %
HCT: 35 % — ABNORMAL LOW (ref 36.0–46.0)
Hemoglobin: 11.5 g/dL — ABNORMAL LOW (ref 12.0–15.0)
Immature Granulocytes: 2 %
Lymphocytes Relative: 7 %
Lymphs Abs: 1.4 10*3/uL (ref 0.7–4.0)
MCH: 30.6 pg (ref 26.0–34.0)
MCHC: 32.9 g/dL (ref 30.0–36.0)
MCV: 93.1 fL (ref 80.0–100.0)
Monocytes Absolute: 0.8 10*3/uL (ref 0.1–1.0)
Monocytes Relative: 4 %
Neutro Abs: 16.7 10*3/uL — ABNORMAL HIGH (ref 1.7–7.7)
Neutrophils Relative %: 81 %
Platelets: 337 10*3/uL (ref 150–400)
RBC: 3.76 MIL/uL — ABNORMAL LOW (ref 3.87–5.11)
RDW: 13.6 % (ref 11.5–15.5)
WBC: 20.8 10*3/uL — ABNORMAL HIGH (ref 4.0–10.5)
nRBC: 0 % (ref 0.0–0.2)

## 2019-03-11 LAB — BASIC METABOLIC PANEL
Anion gap: 10 (ref 5–15)
BUN: 16 mg/dL (ref 8–23)
CO2: 30 mmol/L (ref 22–32)
Calcium: 8.6 mg/dL — ABNORMAL LOW (ref 8.9–10.3)
Chloride: 97 mmol/L — ABNORMAL LOW (ref 98–111)
Creatinine, Ser: 0.38 mg/dL — ABNORMAL LOW (ref 0.44–1.00)
GFR calc Af Amer: >60 mL/min (ref >60)
GFR calc non Af Amer: >60 mL/min (ref >60)
Glucose, Bld: 100 mg/dL — ABNORMAL HIGH (ref 70–99)
Potassium: 4 mmol/L (ref 3.5–5.1)
Sodium: 137 mmol/L (ref 135–145)

## 2019-03-11 LAB — GLUCOSE, CAPILLARY
Glucose-Capillary: 98 mg/dL (ref 70–99)
Glucose-Capillary: 85 mg/dL (ref 70–99)
Glucose-Capillary: 104 mg/dL — ABNORMAL HIGH (ref 70–99)

## 2019-03-11 MED ORDER — PANTOPRAZOLE SODIUM 40 MG PO TBEC
40.0000 mg | DELAYED_RELEASE_TABLET | Freq: Two times a day (BID) | ORAL | Status: DC
  Administered 2019-03-11: 40 mg via ORAL
  Filled 2019-03-11: qty 1

## 2019-03-11 NOTE — Progress Notes (Signed)
Dr. Dareen Piano, MD at bedside to assess patient.  Provider aware of tachypnea and increased oxygen requirement of 13 L HFNC.  Will keep a close assessment and observation of patient and will notify provider of any changes in RR and oxygen requirements.  Pending CXR result from this am.

## 2019-03-11 NOTE — Progress Notes (Signed)
   Vital Signs MEWS/VS Documentation      03/11/2019 1015 03/11/2019 1030 03/11/2019 1039 03/11/2019 1048   MEWS Score:  -  -  -  3   MEWS Score Color:  -  -  -  Yellow   O2 Device:  HFNC  HFNC  HFNC  -   O2 Flow Rate (L/min):  15 L/min  15 L/min  11 L/min  -           Cynthya Yam P Venisa Frampton 03/11/2019,11:43 AM   MD was already aware at 0951 this morning.  Pt oxygen saturation dropped again at 80% saturation on nasal canula at 15L .   Pt was placed on a non-rebreather mask at 15L of oxygen, oxygen saturation per monitor  At 94%.   Patient states that she is breathing better.    Pt is currently in bed with eyes close and response to questions. Call bell within reach.

## 2019-03-11 NOTE — Progress Notes (Addendum)
Paged about increased work of breathing and decreased oxygen saturations. Patient was on the nonrebreather but tubing was somehow disconnected. Saw the patient at the bedside in the presence of the nurses and respiratory therapists.  Patient was put on BiPAP and is tolerating it well currently with saturations at 98%.  Patient says she is much more comfortable now with the BiPAP machine. We discussed with RT that we would like to limit positive pressure, given the fact the patient has a pneumothorax. The goal is to wean pt down to HFNC. We will continue to monitor.  Earlene Plater, MD Internal Medicine, PGY1 Pager: [phone]  03/11/2019,11:05 PM

## 2019-03-11 NOTE — Progress Notes (Signed)
Subjective: Patient assessed at bedside this morning. She is tachypnic with increased O2 requirement of 13L via HFNC. Oxygen saturation improved with deep breathing. Patient reports that she is in pain and we discussed that she should take her pain medications to relieve any discomfort. Patient agreed. Discussed with RN and will closely monitor patient throughout the day.   Patient O2 sat dropped to 80% on 15L HFNC, requiring NRB at 15L with improvement in O2 and patient was subsequently transitioned back to HFNC. On reassessment, patient resting comfortably on her right side with O2 sats of 94% on 14L HFNC. RR nurse at bedside, appreciate assistance.   Objective:  Vital signs in last 24 hours: Vitals:   03/11/19 0200 03/11/19 0540 03/11/19 0749 03/11/19 0827  BP: 109/68 121/62 121/67   Pulse:  99 96   Resp:  20 (!) 30   Temp:  97.9 F (36.6 C) 98.4 F (36.9 C)   TempSrc:  Oral Oral   SpO2:  (!) 88%  96%  Weight:  44.8 kg    Height:       Physical Exam Vitals signs and nursing note reviewed.  Constitutional:      General: She is not in acute distress.    Appearance: She is ill-appearing. She is not diaphoretic.  HENT:     Head: Normocephalic and atraumatic.  Cardiovascular:     Rate and Rhythm: Regular rhythm. Tachycardia present.     Pulses: Normal pulses.     Heart sounds: Normal heart sounds.  Pulmonary:     Effort: Tachypnea present. No accessory muscle usage or respiratory distress.     Breath sounds: Rales present.     Comments: Rales in all lung fields, more significant at bilateral bases Chest:     Chest wall: No tenderness, crepitus or edema.  Abdominal:     General: Bowel sounds are normal.     Palpations: Abdomen is soft. There is no mass.     Tenderness: There is no abdominal tenderness. There is no guarding.  Musculoskeletal: Normal range of motion.  Skin:    General: Skin is warm and dry.     Capillary Refill: Capillary refill takes less than 2 seconds.   Neurological:     General: No focal deficit present.     Mental Status: She is alert and oriented to person, place, and time.    CBC Latest Ref Rng & Units 03/11/2019 03/10/2019 03/09/2019  WBC 4.0 - 10.5 K/uL 20.8(H) 18.8(H) 18.9(H)  Hemoglobin 12.0 - 15.0 g/dL 11.5(L) 11.6(L) 12.2  Hematocrit 36.0 - 46.0 % 35.0(L) 34.9(L) 36.2  Platelets 150 - 400 K/uL 337 321 341   BMP Latest Ref Rng & Units 03/11/2019 03/10/2019 03/09/2019  Glucose 70 - 99 mg/dL 578(I) 88 85  BUN 8 - 23 mg/dL 16 20 20   Creatinine 0.44 - 1.00 mg/dL ) 6.96(E 9.52)  Sodium 135 - 145 mmol/L 137 136 137  Potassium 3.5 - 5.1 mmol/L 4.0 4.0 3.6  Chloride 98 - 111 mmol/L 97(L) 96(L) 101  CO2 22 - 32 mmol/L 30 28 26   Calcium 8.9 - 10.3 mg/dL 8.41(L) ) 2.4(M)    Assessment/Plan:  Julia Massey is a 68yo female with PMHx of COPD, diastolic heart failure and ILD presented with worsening shortness of breath secondary to acute hypoxic respiratory failure suspected to be secondary to ILD failure.   Acute hypoxic respiratory failure:  CTA on 10/12 was notable for bibasilar honeycombing, diffuse ground glass opacities, enlarged right ventricle and  pulmonary arteries consistent with pulmonary artery hypertension and chronic changes consistent with interstitial lung disease. PCCM was consulted and noted possible ILD. Rheumatologic workup notable for elevated sed rate and positive RF, rest of serology were negative including CCP. High-resolution CT on 10/19 showed significant ground glass opacities and findings consistent with UIP with possible overlying edema vs UIP flare. 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 Patient requiring steroids for maintenance of pulmonary function.   She had a chest tube placed on 10/30 due to development of new pneumo-mediastinum and left pneumoothrax. Chest tube in place with good seal. She reports continued pain and discomfort causing her to be tachypnic and hypoxic. CXR  unchanged from previous day's exam without significant changes in the pneumothorax or interstitial opacities. Suspect patient's tachypnea and hypoxia are secondary to pain.  - Appreciate PCCM recommendations - Continue prednisone 40mg  qd - O2 goals of 88-92% - Cardiac monitoring - Duonebs 53ml q8h  - Pain control with Norco 7.5-325mg  tid and dilaudid 0.5mg  q6h prn - Chest tube on suction - Daily CXR  Pulmonary HTN Diastolic heart failure: Echo in 2017 with grade 1 diastolic dysfunction with EF 55-60%. Although TTE on 10/19 showed continued right heart failure, heart cath on 10/22 did show LAD stenosis of 80% without significant pulmonary HTN or right heart strain and cMRI with hypokinesis at RV base with dyskinesis at RV apex. At this time, patient is not a candidate for PCI. At this time, will continue with aspirin and atorvastatin and continue to hold plavix. - Atorvastatin 80mg  qd  - Continue cardiac monitoring - Daily weights and strict I/O's   Acute GI Bleed: Resolved Patient had acute drop in Hb on 10/27 with positive FOBT and received 1u pRBC. GI was consulted; however, patient is not a good candidate for EGD at the time. Her Hb has remained stable ~ 11.5. No hematochezia or hematemesis, no melena.  - Protonix 40mg  PO bid - Continue Aspirin - CBC daily     Dispo: Anticipated discharge pending clinical improvement.   Julia Heck, MD  Internal Medicine, PGY-1 03/11/2019, 8:45 AM Pager: [phone]

## 2019-03-11 NOTE — Progress Notes (Addendum)
Internal Medicine Attending:   I saw and examined the patient. I reviewed the resident's note and I agree with the resident's findings and plan as documented in the resident's note.  Patient did complain of worsening shortness of breath this morning and required to be on 13 L high flow nasal cannula to maintain O2 sats in the 90s.  Patient was initially doing well on 7 to 9 L nasal cannula yesterday.  She appeared tachypneic this morning as well.  Repeat chest x-ray done showed persistent small pneumothorax with chest tube in place and unchanged bilateral infiltrates.  Patient subsequently had an event where her O2 sats decreased to the 60s and she was placed on a nonrebreather with subsequent improvement in her O2 sats to the 90s.  Patient was then placed on high flow nasal cannula initially requiring 15 L.  We reassessed her at this time and she was sleeping comfortably with O2 sats in the 90s on 14 L nasal cannula.  Patient was initially made to the hospital with acute hypoxic respiratory failure likely secondary to ILD possibly IPF.  Her hospital course was complicated by pneumothorax requiring chest tube placement.  We will continue with current dose of steroids for now.  PCCM following patient.  We will follow-up recommendations.  Continue pain control and duo nebs for now.  Overall prognosis remains guarded.  Patient has had multiple discussions with palliative care and wants to remain a full code for now.  I explained to the patient today that if her respiratory status does worsen she will need to go on BiPAP.  Patient expressed understanding with this but still seems reluctant to go on BiPAP.  If her respiratory status continues to worsen she would likely need more respiratory support in the form of BiPAP or ventilator.  We will continue with ongoing discussions with her and her family.

## 2019-03-11 NOTE — Progress Notes (Addendum)
Arrived to patient's room. Patient was Julia Massey and SpO2 was 69 on 4 lpm [place]. Placed patient on NRB and than changed patient to HFNC 15lpm and neb tx given. Patient back to baseline SHOB. Spo2 dropped post neb to 71 on 15lpm HFNC. Placed patient on NRB SpO2 now 91.

## 2019-03-11 NOTE — Significant Event (Signed)
Rapid Response Event Note  Overview: Time Called: 0935 Arrival Time: 0938 Event Type: Respiratory  Initial Focused Assessment: Patient with acute onset of Respiratory distress Desat to 69 BP 131/75  HR 93  RR 30   Crackles through out  Increased WOB Small bore chest tube intact, no air leak.  To 20 cm suction   No sub Q air noted  Interventions: Placed on NRB O2 sats improved to 95% Patient feels more comfortable Resident team notified  Able to wean O2 to 15L HFNC   O2 sats 88% improved to 92-93% after a few minutes. Patient is comfortable lying in the bed. Assisted patient to lying on her right side, patient fell asleep O2 sats are now 98% Weaned O2 to 11L HFNC O2 sats 92%    Plan of Care (if not transferred): Wean O2 as able  Event Summary: Name of Physician Notified: Obed  ( resident with teaching service) at    Name of Consulting Physician Notified: Loanne Drilling at 1015  Outcome: Stayed in room and stabalized  Event End Time: [place]  Raliegh Ip

## 2019-03-11 NOTE — Progress Notes (Signed)
Patient called out stating that she couldn't breath, oxygen saturations were 69 % on 13 L HFNC.  Called Rapid Response Nurse Hela, RN, internal Resident Eulah Pont, MD, charge nurse Forrest Moron, RN.  Per Rapid Response nurse, placed pt on 15 L Non-rebreather.  Respiratory therapy also at bedside.  Per internal medicine resident, pt could be placed on BiPap, but pt refused.  Pt was able to increase oxygen saturations to 87-92 % on 15 L non-breather, and then requested to be switched to [place].  On 15L [place] pt saturations are 88-85 %, will continue to monitor.

## 2019-03-11 NOTE — Progress Notes (Signed)
RT called to place patient on BIPAP due to decreased spo2 and increased WOB. Patient is tolerating BIPAP well at this time. RR was in the 60s but has improved to the 40s. Patient states she is breathing easier. RN and Internal Medicine are aware. RT will continue to monitor.

## 2019-03-11 NOTE — Progress Notes (Signed)
NAME:  Julia Massey, MRN:  [id number], DOB:  Nov 24, 1950, LOS: 42 ADMISSION DATE:  02/24/2019, CONSULTATION DATE:  02/19/19 REFERRING MD:  Valli Glance IMTS, CHIEF COMPLAINT:  Acute on chronic respiratory failure  Brief History   68 year old with COPD, diastolic heart failure presenting with acute hypoxic respiratory failure on high flow nasal cannula Work-up consists of connective tissue serology showing elevated rheumatoid factor.  She had been told as an outpatient that she may have rheumatoid arthritis versus gout She is an ex-smoker, quit in 2017.  Used to work as a Office manager.  No known exposures with no birds or feather pillows or comforters or mold exposure  Past Medical History  COPD Carpal Tunnel Chronic back pain Gout HLD Diastolic heart dysfunction   Significant Hospital Events   10/11 admitted  10/12 PCCM consulted.  10/21 - says not better. On 10LNC at rest and as she talks pulse ox is 88%. Says pre-admit was not on o2 but doing well. Says brother in [place] who is 19 years older Yetta Flock) also diagnosed with pulmonary fibrosis in feb 2020. She wants me to call him. Chart review shows RV strain + but she does not have dema.  Chart review shows essentially negative serology  (RF is < 2x ULN). HRCT shows UIP wiuth GGO - pretty c/w UIP flare  10/22  - Relatively normal RHC and no rv failure. 80% LAD stenosis. Start solumedrol 125mg  IV q6h. Started higher dose IV steroids (Solumedrol 125 q6 hrs)  10/23 -  s/p cardiac MRI. Results pending. Cards prefers to see course with steroids and results of cardiac MRI before deciding on stent.   10/27 - plan for LHC today, but had 4gm drop in hgb so procedure on hold.   10/30 chest tube on left placed for persistent PTX and evolving pneumomediastinum    Consults:  PCCM GI  Procedures:  Left chest tube 10/30>>  Significant Diagnostic Tests:  10/12 CT angio chest> No PE. enlarged heart, no pericardial effusion.  Diffuse ground-glass appearance throughout lungs. No Pleural Effusion. Emphysematous changes.   10/19 CT high-resolution> Basilar predominant fibrosis honeycombing.  Definite UIP pattern. Reviewed images personally.  10/27 CT abdomen> No acute CT finding, specifically with no evidence of hematoma.  Micro Data:  10/11 SARS CoV2> neg  10/12 RVP neg  Antimicrobials:  Azithroymycin 10/11-10/13 Rocephin 10/11-10/13  Interim history/subjective:  This morning desatted to 60% despite increasing HFN which prompted rapid response. She was placed on NRB  Objective   Blood pressure 121/84, pulse (!) 102, temperature 98.6 F (37 C), temperature source Oral, resp. rate (!) 29, height 5\' 6"  (1.676 m), weight 44.8 kg, SpO2 96 %.        Intake/Output Summary (Last 24 hours) at 03/11/2019 1402 Last data filed at 03/11/2019 0600 Gross per 24 hour  Intake 200 ml  Output 1157 ml  Net -957 ml   Filed Weights   03/09/19 0213 03/10/19 0530 03/11/19 0540  Weight: 48.2 kg 45.5 kg 44.8 kg   Physical Exam: General: Thin, elderly female, chronically ill-appearing, on NRB, able to speak full sentences HENT: [place], AT, OP clear, MMM Eyes: EOMI, no scleral icterus Respiratory: Diminished breath sounds with crackles bilaterally. Cardiovascular: RRR, -M/R/G, no JVD GI: BS+, soft, nontender Extremities:-Edema,-tenderness Neuro: AAO x4, CNII-XII grossly intact  CXR 03/11/19 - Unchanged small left PTX and pneumomediastinum. Left chest tube in place.  Resolved Hospital Problem list     Assessment & Plan:   Bilateral ground glass  pulmonary infiltrates w/ associated hypoxic respiratory failure .  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 The cause of ILD is unclear however this may be related to  fibrotic progression of her prior ARDS. She respiratory failure work-up has included cardiac evaluation which identified 80% LAD lesion (PCI planned for when respiratory status improves) and reduced RV output. Echocardiogram noted RVSP 53.8 suggestive of pulmonary hypertension however cardiac cath did not show significant PAH. She is being treated with steroids and diuresis. She has been weaned to 7L O2 which is an improvement. We have discussed her ILD course and possible progression which will likely result in being discharged on oxygen. 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 I am concerned about her lung's ability to heal from the pneumothorax but we will continue current chest tube management and see how she does.   Increased O2 requirement this morning requiring NRB. Now weaned to 15L HFNC. Chest tube is causing extreme discomfort and does not demonstrate healing on suction. Will place chest tube on water seal and re-evaluate with repeat CXR. Prognosis is very guarded given her lack/slow response to therapy.  Plan Chest tube to WS Repeat CXR this after Cont prednisone 40 mg daily  Wean oxygen as able for goal SpO2 88-92% OOB as tolerated Agree with Palliative Care. Will need ongoing discussions for care  Pulmonary will continue to follow  Mechele Collin, M.D. [facility] Pulmonary/Critical Care Medicine 03/11/2019 3:36 PM

## 2019-03-11 NOTE — Progress Notes (Signed)
No acute change. Continuing to monitor patient's oxygen needs and work of breathing.

## 2019-03-11 NOTE — Progress Notes (Signed)
Rapid Response called for pt being hypoxic. Upon arrival pt was on a NRB. Pt placed back on a salter [place] at 15lpm. Pt refused Bipap. Paged CCM Dr Loanne Drilling. Suggested using heated HFNC if needed instead of Bipap.

## 2019-03-11 DEATH — deceased

## 2019-03-12 ENCOUNTER — Inpatient Hospital Stay (HOSPITAL_COMMUNITY): Payer: Medicare HMO

## 2019-03-12 DIAGNOSIS — J9601 Acute respiratory failure with hypoxia: Secondary | ICD-10-CM | POA: Diagnosis not present

## 2019-03-12 DIAGNOSIS — G8929 Other chronic pain: Secondary | ICD-10-CM | POA: Diagnosis not present

## 2019-03-12 DIAGNOSIS — I10 Essential (primary) hypertension: Secondary | ICD-10-CM | POA: Diagnosis not present

## 2019-03-12 DIAGNOSIS — R0609 Other forms of dyspnea: Secondary | ICD-10-CM

## 2019-03-12 DIAGNOSIS — I5030 Unspecified diastolic (congestive) heart failure: Secondary | ICD-10-CM

## 2019-03-12 DIAGNOSIS — J982 Interstitial emphysema: Secondary | ICD-10-CM | POA: Diagnosis not present

## 2019-03-12 DIAGNOSIS — I272 Pulmonary hypertension, unspecified: Secondary | ICD-10-CM | POA: Diagnosis not present

## 2019-03-12 DIAGNOSIS — I110 Hypertensive heart disease with heart failure: Secondary | ICD-10-CM

## 2019-03-12 DIAGNOSIS — M069 Rheumatoid arthritis, unspecified: Secondary | ICD-10-CM | POA: Diagnosis not present

## 2019-03-12 DIAGNOSIS — J9312 Secondary spontaneous pneumothorax: Secondary | ICD-10-CM | POA: Diagnosis not present

## 2019-03-12 DIAGNOSIS — J449 Chronic obstructive pulmonary disease, unspecified: Secondary | ICD-10-CM | POA: Diagnosis not present

## 2019-03-12 DIAGNOSIS — J849 Interstitial pulmonary disease, unspecified: Secondary | ICD-10-CM | POA: Diagnosis not present

## 2019-03-12 DIAGNOSIS — R0602 Shortness of breath: Secondary | ICD-10-CM | POA: Diagnosis not present

## 2019-03-12 DIAGNOSIS — Z789 Other specified health status: Secondary | ICD-10-CM | POA: Diagnosis not present

## 2019-03-12 DIAGNOSIS — J939 Pneumothorax, unspecified: Secondary | ICD-10-CM | POA: Diagnosis not present

## 2019-03-12 DIAGNOSIS — A419 Sepsis, unspecified organism: Secondary | ICD-10-CM

## 2019-03-12 DIAGNOSIS — Z978 Presence of other specified devices: Secondary | ICD-10-CM

## 2019-03-12 DIAGNOSIS — Z4682 Encounter for fitting and adjustment of non-vascular catheter: Secondary | ICD-10-CM | POA: Diagnosis not present

## 2019-03-12 LAB — POCT I-STAT 7, (LYTES, BLD GAS, ICA,H+H)
Acid-Base Excess: 1 mmol/L (ref 0.0–2.0)
Bicarbonate: 30.7 mmol/L — ABNORMAL HIGH (ref 20.0–28.0)
Calcium, Ion: 1.24 mmol/L (ref 1.15–1.40)
HCT: 37 % (ref 36.0–46.0)
Hemoglobin: 12.6 g/dL (ref 12.0–15.0)
O2 Saturation: 97 %
Patient temperature: 98.6
Potassium: 4.9 mmol/L (ref 3.5–5.1)
Sodium: 137 mmol/L (ref 135–145)
TCO2: 33 mmol/L — ABNORMAL HIGH (ref 22–32)
pCO2 arterial: 74.7 mmHg (ref 32.0–48.0)
pH, Arterial: 7.223 — ABNORMAL LOW (ref 7.350–7.450)
pO2, Arterial: 109 mmHg — ABNORMAL HIGH (ref 83.0–108.0)

## 2019-03-12 LAB — GLUCOSE, CAPILLARY
Glucose-Capillary: 112 mg/dL — ABNORMAL HIGH (ref 70–99)
Glucose-Capillary: 125 mg/dL — ABNORMAL HIGH (ref 70–99)
Glucose-Capillary: 140 mg/dL — ABNORMAL HIGH (ref 70–99)
Glucose-Capillary: 148 mg/dL — ABNORMAL HIGH (ref 70–99)
Glucose-Capillary: 89 mg/dL (ref 70–99)

## 2019-03-12 LAB — CBC
HCT: 38.1 % (ref 36.0–46.0)
Hemoglobin: 12.3 g/dL (ref 12.0–15.0)
MCH: 30.3 pg (ref 26.0–34.0)
MCHC: 32.3 g/dL (ref 30.0–36.0)
MCV: 93.8 fL (ref 80.0–100.0)
Platelets: 396 10*3/uL (ref 150–400)
RBC: 4.06 MIL/uL (ref 3.87–5.11)
RDW: 13.7 % (ref 11.5–15.5)
WBC: 27.1 10*3/uL — ABNORMAL HIGH (ref 4.0–10.5)
nRBC: 0 % (ref 0.0–0.2)

## 2019-03-12 LAB — HYPERSENSITIVITY PNEUMONITIS
A. Pullulans Abs: NEGATIVE
A.Fumigatus #1 Abs: NEGATIVE
Micropolyspora faeni, IgG: NEGATIVE
Pigeon Serum Abs: NEGATIVE
Thermoact. Saccharii: NEGATIVE
Thermoactinomyces vulgaris, IgG: NEGATIVE

## 2019-03-12 LAB — BASIC METABOLIC PANEL
Anion gap: 14 (ref 5–15)
BUN: 17 mg/dL (ref 8–23)
CO2: 27 mmol/L (ref 22–32)
Calcium: 8.9 mg/dL (ref 8.9–10.3)
Chloride: 96 mmol/L — ABNORMAL LOW (ref 98–111)
Creatinine, Ser: 0.39 mg/dL — ABNORMAL LOW (ref 0.44–1.00)
GFR calc Af Amer: >60 mL/min (ref >60)
GFR calc non Af Amer: >60 mL/min (ref >60)
Glucose, Bld: 102 mg/dL — ABNORMAL HIGH (ref 70–99)
Potassium: 4.3 mmol/L (ref 3.5–5.1)
Sodium: 137 mmol/L (ref 135–145)

## 2019-03-12 MED ORDER — ROCURONIUM BROMIDE 50 MG/5ML IV SOLN
50.0000 mg | Freq: Once | INTRAVENOUS | Status: AC
  Administered 2019-03-12: 50 mg via INTRAVENOUS

## 2019-03-12 MED ORDER — CHLORHEXIDINE GLUCONATE 0.12% ORAL RINSE (MEDLINE KIT)
15.0000 mL | Freq: Two times a day (BID) | OROMUCOSAL | Status: DC
  Administered 2019-03-12: 15 mL via OROMUCOSAL

## 2019-03-12 MED ORDER — FENTANYL CITRATE (PF) 100 MCG/2ML IJ SOLN
INTRAMUSCULAR | Status: AC
  Administered 2019-03-12: 50 ug via INTRAVENOUS
  Filled 2019-03-12: qty 2

## 2019-03-12 MED ORDER — CHLORHEXIDINE GLUCONATE CLOTH 2 % EX PADS
6.0000 | MEDICATED_PAD | Freq: Every day | CUTANEOUS | Status: DC
  Administered 2019-03-12: 6 via TOPICAL

## 2019-03-12 MED ORDER — FENTANYL CITRATE (PF) 100 MCG/2ML IJ SOLN
25.0000 ug | INTRAMUSCULAR | Status: DC | PRN
  Administered 2019-03-12 (×2): 25 ug via INTRAVENOUS
  Administered 2019-03-12: 50 ug via INTRAVENOUS
  Filled 2019-03-12 (×3): qty 2

## 2019-03-12 MED ORDER — ORAL CARE MOUTH RINSE
15.0000 mL | OROMUCOSAL | Status: DC
  Administered 2019-03-12 – 2019-03-13 (×4): 15 mL via OROMUCOSAL

## 2019-03-12 MED ORDER — MIDAZOLAM HCL 2 MG/2ML IJ SOLN
1.0000 mg | Freq: Once | INTRAMUSCULAR | Status: AC
  Administered 2019-03-12: 14:00:00 1 mg via INTRAVENOUS

## 2019-03-12 MED ORDER — PANTOPRAZOLE SODIUM 40 MG IV SOLR
40.0000 mg | Freq: Two times a day (BID) | INTRAVENOUS | Status: DC
  Administered 2019-03-12 (×2): 40 mg via INTRAVENOUS
  Filled 2019-03-12 (×3): qty 40

## 2019-03-12 MED ORDER — VASOPRESSIN 20 UNIT/ML IV SOLN
0.0300 [IU]/min | INTRAVENOUS | Status: DC
  Administered 2019-03-13: 0.03 [IU]/min via INTRAVENOUS
  Filled 2019-03-12: qty 2

## 2019-03-12 MED ORDER — LACTATED RINGERS IV SOLN
INTRAVENOUS | Status: DC
  Administered 2019-03-12: 18:00:00 via INTRAVENOUS

## 2019-03-12 MED ORDER — ENOXAPARIN SODIUM 30 MG/0.3ML ~~LOC~~ SOLN
30.0000 mg | SUBCUTANEOUS | Status: DC
  Administered 2019-03-12: 30 mg via SUBCUTANEOUS
  Filled 2019-03-12: qty 0.3

## 2019-03-12 MED ORDER — METHYLPREDNISOLONE SODIUM SUCC 125 MG IJ SOLR
80.0000 mg | Freq: Once | INTRAMUSCULAR | Status: AC
  Administered 2019-03-12: 80 mg via INTRAVENOUS
  Filled 2019-03-12: qty 2

## 2019-03-12 MED ORDER — NOREPINEPHRINE 4 MG/250ML-% IV SOLN
0.0000 ug/min | INTRAVENOUS | Status: DC
  Administered 2019-03-12: 5 ug/min via INTRAVENOUS
  Administered 2019-03-12: 25 ug/min via INTRAVENOUS
  Administered 2019-03-13: 40 ug/min via INTRAVENOUS
  Administered 2019-03-13: 04:00:00 70 ug/min via INTRAVENOUS
  Administered 2019-03-13: 35 ug/min via INTRAVENOUS
  Filled 2019-03-12: qty 250
  Filled 2019-03-12: qty 500
  Filled 2019-03-12 (×2): qty 250

## 2019-03-12 MED ORDER — NOREPINEPHRINE 4 MG/250ML-% IV SOLN
INTRAVENOUS | Status: AC
  Filled 2019-03-12: qty 250

## 2019-03-12 MED ORDER — LACTATED RINGERS IV BOLUS
500.0000 mL | Freq: Once | INTRAVENOUS | Status: AC
  Administered 2019-03-12: 500 mL via INTRAVENOUS

## 2019-03-12 MED ORDER — ETOMIDATE 2 MG/ML IV SOLN
20.0000 mg | Freq: Once | INTRAVENOUS | Status: AC
  Administered 2019-03-12: 20 mg via INTRAVENOUS

## 2019-03-12 MED ORDER — METHYLPREDNISOLONE SODIUM SUCC 40 MG IJ SOLR
40.0000 mg | Freq: Every day | INTRAMUSCULAR | Status: DC
  Administered 2019-03-12: 40 mg via INTRAVENOUS
  Filled 2019-03-12 (×2): qty 1

## 2019-03-12 MED ORDER — LACTATED RINGERS IV BOLUS: 500.0000 mL | Freq: Once | INTRAVENOUS | Status: AC

## 2019-03-12 MED ORDER — MIDAZOLAM HCL 2 MG/2ML IJ SOLN
INTRAMUSCULAR | Status: AC
  Administered 2019-03-12: 1 mg via INTRAVENOUS
  Filled 2019-03-12: qty 2

## 2019-03-12 MED ORDER — FENTANYL CITRATE (PF) 100 MCG/2ML IJ SOLN
25.0000 ug | INTRAMUSCULAR | Status: AC | PRN
  Administered 2019-03-12 (×3): 25 ug via INTRAVENOUS

## 2019-03-12 MED ORDER — FENTANYL CITRATE (PF) 100 MCG/2ML IJ SOLN
50.0000 ug | Freq: Once | INTRAMUSCULAR | Status: AC
  Administered 2019-03-12: 14:00:00 50 ug via INTRAVENOUS

## 2019-03-12 NOTE — Plan of Care (Addendum)
Discussed Julia Massey's care with Debi, who understands where she is in her disease process. The family is struggling with her rapid progression and is not ready to withdraw care. Her husband is planning on coming tomorrow to visit. They know that she may not make it though tonight and that additional procedures are unlikely to provide benefit and pose a risk of accelerating her death.   Currently saturating in low 90s on her left side, but she desaturates when laying flat. She should stay on her left side overnight. Family is not ready to change code status. I have let them know that they should come to bedside tomorrow because I do not anticipate that she has more than another day or so, regardless of their decisions for ongoing care.  >30 minutes of critical care time provided.  Julian Hy, DO 03/12/19 10:00 PM [facility]

## 2019-03-12 NOTE — Progress Notes (Addendum)
PCCM brief progress note   68 yo F with persistent L PTX s/p pigtail placement, pneumomediastinum small R apical PTX, underlying pulmonary disease who was intubated 03/12/19 for worsening respiratory failure.   Arrived at bedside and noted patient was hypotensive SBPs 80s with narrow pulse pressure. Poor wave form with SpO2 fluctuating between SpO2 70-90%. FiO2 increased from 80% to 100% without improvement. Patient is pale, very diaphoretic and appears uncomfortable. Minimally sedated and is not following commands. Distal BLE with cyanosis and BUE BLE capillary refill is sluggish. Subcutaneous air is noted across chest and neck extending to mandible.  Recent CXR after intubation read as interval worsening of L PTX and increased visibility of pneumomediastinum    Brief POCUS performed by [facility] NPs revealing anticipated absence of lung slide bilateral apices, and anticipated absent/diminished L sided lung slide beyond apex. L chest tube remains to suction.  IVC very collapsible on POCUS.   556ml bolus initiated. Follow up BP with worsening SBP. NE initiated. PCCM MDs  arrive at bedside for further evaluation including POCUS    Shock -suspect mixed hypovolemic and obstructive in setting of worsening PTX, positive pressure from PEEP 5.  -has received minimal sedation and do not anticipate this has large contributing component.  P -At this time we will continue careful volume resuscitation and peripheral pressors. -It is possible patient may require CVC if progressively escalating pressor requirement. -ABG and CXR ordered   -Do not feel that inserting second chest tube will be of great utility -Continue goals of care conversations (full code, aggressive care desired at present. Cousin is Media planner)   Additional critical care time: 35 minutes   Eliseo Gum MSN, AGACNP-BC [place] [phone] If no answer, [phone] 03/12/2019, 5:50 PM

## 2019-03-12 NOTE — Progress Notes (Signed)
Radiology called for report, paged Dr Marva Panda to Mariella Saa

## 2019-03-12 NOTE — Progress Notes (Signed)
RT NOTES: Patient transported to 4N25 on 100% NRB. Sats in 30s. Placed back on [place] 40L/100% with 100% NRB over top. CCM has been paged and is on the way.

## 2019-03-12 NOTE — Progress Notes (Signed)
Subjective: Patient assessed at bedside this morning. She is on BiPAP and states that she feels tired and would like to sleep. Discussed with patient about her CXR results. She reports that she is aware. However, when attempting to discuss prognosis, patient reports that she is aware her prognosis is poor but does not want to discuss at this point.   Overnight, patient had increased work of breathing when her NRB mask came off and she desaturated to the 70s. Due to her continued work of breathing even with replacement of the NRB mask, patient was placed on BiPAP. She tolerated it well.   Objective:  Vital signs in last 24 hours: Vitals:   03/12/19 1117 03/12/19 1149 03/12/19 1211 03/12/19 1216  BP:   (!) 144/93   Pulse: (!) 108  (!) 108   Resp: (!) 45 (!) 34    Temp:   97.8 F (36.6 C)   TempSrc:   Axillary   SpO2: 97%  (!) 88% (!) 87%  Weight:      Height:       Physical Exam Vitals signs and nursing note reviewed.  Constitutional:      General: She is not in acute distress.    Appearance: She is ill-appearing. She is not diaphoretic.  Cardiovascular:     Rate and Rhythm: Regular rhythm. Tachycardia present.     Pulses: Normal pulses.     Heart sounds: Normal heart sounds.  Pulmonary:     Effort: Tachypnea present. No accessory muscle usage or respiratory distress.     Breath sounds: Rales present.     Comments: Rales in all lung fields, more significant at bilateral bases Chest:     Chest wall: No tenderness, crepitus or edema.  Abdominal:     Tenderness: There is no abdominal tenderness.  Skin:    Capillary Refill: Capillary refill takes less than 2 seconds.  Neurological:     General: No focal deficit present.     Mental Status: She is alert and oriented to person, place, and time.    CBC Latest Ref Rng & Units 03/12/2019 03/11/2019 03/10/2019  WBC 4.0 - 10.5 K/uL 27.1(H) 20.8(H) 18.8(H)  Hemoglobin 12.0 - 15.0 g/dL 42.3 11.5(L) 11.6(L)  Hematocrit 36.0 - 46.0 %  38.1 35.0(L) 34.9(L)  Platelets 150 - 400 K/uL 396 337 321   BMP Latest Ref Rng & Units 03/12/2019 03/11/2019 03/10/2019  Glucose 70 - 99 mg/dL 536(R) 443(X) 88  BUN 8 - 23 mg/dL 17 16 20   Creatinine 0.44 - 1.00 mg/dL ) 5.40(G) 8.67(Y  Sodium 135 - 145 mmol/L 137 137 136  Potassium 3.5 - 5.1 mmol/L 4.3 4.0 4.0  Chloride 98 - 111 mmol/L 96(L) 97(L) 96(L)  CO2 22 - 32 mmol/L 27 30 28   Calcium 8.9 - 10.3 mg/dL 8.9 1.95) )    Assessment/Plan:  Ms. Leavelle is a 68yo female with PMHx of COPD, diastolic heart failure and ILD presented with worsening shortness of breath secondary to acute hypoxic respiratory failure suspected to be secondary to ILD failure.   Acute hypoxic respiratory failure:  CTA on 10/12 was notable for bibasilar honeycombing, diffuse ground glass opacities, enlarged right ventricle and pulmonary arteries consistent with pulmonary artery hypertension and chronic changes consistent with interstitial lung disease. PCCM was consulted and noted possible ILD. Rheumatologic workup notable for elevated sed rate and positive RF, rest of serology were negative including CCP. High-resolution CT on 10/19 showed significant ground glass opacities and findings consistent with UIP with  possible overlying edema vs UIP flare. 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 Patient requiring steroids for maintenance of pulmonary function.   She had a chest tube placed on 10/30 due to development of new pneumo-mediastinum and left pneumoothrax. Chest tube in place with good seal.  reports continued pain and discomfort causing her to be tachypnic and hypoxic.   Overnight, patient became hypoxic with increased work of breathing requiring BiPAP support. Patient continued on BiPAP this morning but reports discomfort and feeling like she is going to die. CXR this morning with increased left pneumothorax and new right apical pneumothorax. Chest tube was initially on water seal but  this morning switched to suction. PCCM at bedside. Given patient's pneumothorax, patient is not good candidate for BiPAP as the positive pressure will worsen the pneumothorax. However, with discontinuation of BiPAP, patient is only saturating 82-85% on 15L NRB.  Repeating CXR.  PCCM is at bedside and will transfer patient to the ICU.  - Appreciate PCCM recommendations - Patient to be transferred to ICU - O2 goals of 88-92%  Patient wants her cousin, Jackelyn Poling and her husband to make her medical decisions if she is unable to. Phone number for both is located in the chart. PCCM discussed her clinical prognosis with Debbie. She is agreeable with patient's wishes to pursue aggressive treatment.    Pulmonary HTN Diastolic heart failure: Echo in 2017 with grade 1 diastolic dysfunction with EF 55-60%. Although TTE on 10/19 showed continued right heart failure, heart cath on 10/22 did show LAD stenosis of 80% without significant pulmonary HTN or right heart strain and cMRI with hypokinesis at RV base with dyskinesis at RV apex. At this time, patient is not a candidate for PCI. At this time, will continue with aspirin and atorvastatin and continue to hold plavix. - Atorvastatin 80mg  qd  - Continue cardiac monitoring - Daily weights and strict I/O's    Dispo: Anticipated discharge pending clinical improvement.   Harvie Heck, MD  Internal Medicine, PGY-1 03/12/2019, 12:32 PM Pager: [phone]

## 2019-03-12 NOTE — Progress Notes (Signed)
PT Cancellation Note  Patient Details Name: Julia Massey MRN: [id number] DOB: Sep 21, 1950   Cancelled Treatment:    Reason Eval/Treat Not Completed: Patient not medically ready. Pt just placed on 100% BiPap and chest tube back on suction. RN asked to hold today as chest xray is worse and pt having a very hard time breathing at this time. Acute PT to return as able.   Kittie Plater, PT, DPT Acute Rehabilitation Services Pager #: [phone] Office #: [phone]    Berline Lopes 03/12/2019, 8:47 AM

## 2019-03-12 NOTE — Progress Notes (Signed)
PCCM Interval Note  I contacted and updated family (Julia Massey, patient's preferred 1st contact) regarding unsuccessful chest tube insertion. I also updated her that patient is in critical condition on max vent settings and hypotension secondary to bilateral pneumothoraces. I explained that the patient's prognosis is very grim based on her current  lung function and worsening pneumothoraces. I expressed concern that she is unlikely to survive this hospitalization and is at risk of passing away tonight. I strongly encouraged her to consider comfort care as her current condition is not reversible. If they chose comfort care, up to four family members would be able to visit her before she potentially passes.  Julia Massey expressed she understood my concerns and will discuss this with patient's husband. However, she would like to see how patient does overnight and they will currently plan to visit tomorrow with hopes that she may improve. I reiterated my above concerns at this point in the conversation. She expressed understanding about the potential consequence that Julia Massey could pass away tonight. She will discuss with patient's husband.  Plan Remain full code Continue current management   Rodman Pickle, M.D. [facility] Pulmonary/Critical Care Medicine 03/12/2019 7:04 PM   CC: 45 minutes

## 2019-03-12 NOTE — Progress Notes (Signed)
Pt was having increased SOB, and her nonrebreather mask came off for a few seconds and her sats went into the 70's.  RN got the mask back on, and her sats did get back into the 80's-90's, she was still having trouble breathing, RN called MD to order cont bipap.  RT called to put pt on bipap and access, MD came to assess as well, will continue to monitor, Thanks Arvella Nigh RN.

## 2019-03-12 NOTE — Progress Notes (Signed)
PT mews 3 due to RR and ST, pt on vs q 2 hours, pt on bipap 100%, RT in to see pt, cxr port done as ordered, paged MD  and spoke to Dr Marva Panda to assess meds as pt is not able to come off bpap to take po meds,

## 2019-03-12 NOTE — Progress Notes (Signed)
eLink Physician-Brief Progress Note Patient Name: Julia Massey DOB: 12-12-1950 MRN: [id number]   Date of Service  03/12/2019  HPI/Events of Note  Hypoxia - In setting of persistent pneumothoracies and IPF. Difficult situation. Full code at present.   eICU Interventions  Will order: 1. Portable CXR STAT. 2. Will ask ground team to evaluation patient at the bedside.      Intervention Category Major Interventions: Hypoxemia - evaluation and management  [name] 03/12/2019, 9:05 PM

## 2019-03-12 NOTE — Progress Notes (Signed)
RT NOTES: Called to room by MD d/t patient being on bipap with a pneumothorax. Placed patient on 100%/40L HHFNC with a NRB in place. Sats 87% at this time.

## 2019-03-12 NOTE — Progress Notes (Addendum)
Pt transported to 4N25 in bed on NRB and swat nurse x2, RT brough heated HF set up, paged Dr Loanne Drilling of pt to 4n25, sats down to 65% on transport, pt c/o unable to breathe,  Bedside report given to nurse and all questions entertained and answered, pt belongings were taken with pt, Dr Loanne Drilling had talked to family earlier on phone and aware of plan of care

## 2019-03-12 NOTE — Progress Notes (Signed)
Chaplain provided spiritual presence and prayer to Ms. Julia Massey who was experiencing a lot of stress and anxiety around being transferred to another unit.  Chaplain and nurse worked to offer her assurance.  Will follow-up as needed.

## 2019-03-12 NOTE — Progress Notes (Addendum)
Pt called for RN " I dont want to be left alone. I feel like im going to  Die"  Lungs coars throught out, sat 97% , pt says she feels anxious, not able to take prozac at this time due to bipap, nurse sat with pt and allowed to relax, paged MD to request something for anxiety, pt denies pain, pt was also wet with urine due to suction utilized for chest tube and forgot she did not have purewick in place, pt assisted x2 to get bath and bed change, sats down to 86%, allowed to rest and encourage breathing and back to 93%, spoke to unit clerk in regards of another suction to allow pt to use purewick due to sats drop with little activity, she will check with unit ad, advise nurse in pt room due ot anxiety  Addendum- Md up to bedside ot evaluate pt

## 2019-03-12 NOTE — Progress Notes (Signed)
Pulmonology at bedside, paged RT for heated high flow and also IM MD per request

## 2019-03-12 NOTE — Procedures (Signed)
Intubation Procedure Note Julia Massey [id number] 06/10/1950  Procedure: Intubation Indications: Respiratory insufficiency  Procedure Details Consent: Risks of procedure as well as the alternatives and risks of each were explained to the (patient/caregiver).  Consent for procedure obtained. Time Out: Verified patient identification, verified procedure, site/side was marked, verified correct patient position, special equipment/implants available, medications/allergies/relevent history reviewed, required imaging and test results available.  Performed  Maximum sterile technique was used including antiseptics, cap, gloves, gown, hand hygiene, mask and sheet.  MAC and 3    Evaluation Hemodynamic Status: Persistent hypotension treated with fluid; O2 sats: transiently fell during during procedure Patient's Current Condition: stable Complications: No apparent complications Patient did tolerate procedure well. Chest X-ray ordered to verify placement.  CXR: tube position acceptable.   Julia Massey 03/12/2019

## 2019-03-12 NOTE — Progress Notes (Addendum)
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 Pulmonary initially consulted for ILD work-up. Hospital course c/b pneumomediastinum and left pneumothorax s/p left chest tube placement. Since 11/1, her respiratory status worsened and required intubation.  Past Medical History  COPD Carpal Tunnel Chronic back pain Gout HLD Diastolic heart dysfunction   Significant Hospital Events   10/11 admitted  10/12 PCCM consulted.  10/21 - says not better. On 10LNC at rest and as she talks pulse ox is 88%. Says pre-admit was not on o2 but doing well. Says brother in [place] who is 6 years older Brooke Bonito) also diagnosed with pulmonary fibrosis in feb 2020. She wants me to call him. Chart review shows RV strain + but she does not have dema.  Chart review shows essentially negative serology  (RF is < 2x ULN). HRCT shows UIP wiuth GGO - pretty c/w UIP flare  10/22  - Relatively normal RHC and no rv failure. 80% LAD stenosis. Start solumedrol 125mg  IV q6h. Started higher dose IV steroids (Solumedrol 125 q6 hrs)  10/23 -  s/p cardiac MRI. Results pending. Cards prefers to see course with steroids and results of cardiac MRI before deciding on stent.   10/27 - plan for LHC today, but had 4gm drop in hgb so procedure on hold.   10/30 chest tube on left placed for persistent PTX and evolving pneumomediastinum. O2 requirement of 7L via [place] unchanged after chest tube placement.   10/31 unchanged left PTX, chest tube remained on suction. Unchanged oxygen requirement  11/1 Increased O2 requirement. Unchanged left pneumothorax. Chest tube placed on WS  11/2  Overnight patient placed on BiPAP which likely worsened PTX. Resumed on suction this morning. Transferred to ICU for persistent hypoxemia. Intubated with improved oxygenation  Consults:  PCCM GI  Procedures:  Left chest tube 10/30>>  Significant Diagnostic Tests:  10/12 CT angio chest> No PE. enlarged heart, no pericardial effusion. Diffuse ground-glass appearance throughout lungs. No Pleural Effusion. Emphysematous changes.   10/19 CT high-resolution> Basilar predominant fibrosis honeycombing.  Definite UIP pattern. Reviewed images personally.  10/27 CT abdomen> No acute CT finding, specifically with no evidence of hematoma.  10/30 CT Chest > Moderate pneumomediastinum. Small left pneumothorax. Left apical 25mm nodule  Micro Data:  10/11 SARS CoV2> neg  10/12 RVP neg  Antimicrobials:  Azithroymycin 10/11-10/13 Rocephin 10/11-10/13  Interim history/subjective:  Yesterday, chest tube was placed on waterseal with interval PM CXR stable. Overnight, patient had worsening desaturations and placed on BiPAP. This am, CXR with worsening moderate-sized left pneumothorax and new tiny apical right pneumothorax and subcutaneous emphysema. Chest tube placed on suction however patient remained hypoxemic on BiPAP. Transferred to the ICU for intubation.  Objective   Blood pressure 103/84, pulse (!) 132, temperature 97.8 F (36.6 C), temperature source Axillary, resp. rate (!) 24, height 5\' 6"  (1.676 m), weight 45.4 kg, SpO2 (!) 86 %.    FiO2 (%):  [80 %-100 %] 100 %   Intake/Output Summary (Last 24 hours) at 03/12/2019 1425 Last data filed at 03/12/2019 1301 Gross per 24 hour  Intake 123 ml  Output 500 ml  Net -377 ml  Filed Weights   03/11/19 0540 03/12/19 0700 03/12/19 0823  Weight: 44.8 kg 46.2 kg 45.4 kg   Physical Exam: General: Thin, elderly chronically ill female, on BiPAP HENT: [place], AT, OP clear, MMM Eyes: EOMI, no scleral icterus Respiratory: Diminished breath sounds  bilaterally. 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 The cause of ILD is unclear however this may be related to fibrotic progression of her prior ARDS. Her respiratory failure work-up has included cardiac evaluation which identified 80% LAD lesion (PCI planned for when respiratory status improves) and reduced RV output. Echocardiogram noted RVSP 53.8 suggestive of pulmonary hypertension however cardiac cath did not show significant PAH.   Hospital course has been complicated by pneumomediastinum and left pneumothorax. Acutely decompensated on 11/2. Transferred to ICU for intubation.  Acute hypoxemic respiratory failure secondary ILD exacerbation, left pneumothorax, pneumomediastinum Full vent support Reviewed ABG. Permissive hypercapnia. Chest tube to suction -20 Solumedrol 80 mg now. Continue steroids  GOC Patient expressed wish to be aggressive with care and does want trial of mechanical ventilation. She understands that her prognosis is poor and there is a chance she may not survive with her current lung disease. She wishes to have her cousin, Debie, to be primarily involved in decisions for her care and her husband. Does not want her daughter to be included. I have updated the cousin, Debie. Primary team has contacted the husband.  Diet: NPO GI PPx: PPI DVT PPx: Lovenox  The patient is critically ill with multiple organ systems failure and requires high complexity decision making for assessment and support, frequent evaluation and titration of therapies, application of advanced  monitoring technologies and extensive interpretation of multiple databases.   Critical Care Time devoted to patient care services described in this note is 70 Minutes. This time reflects time of care of this signee Dr. Rodman Pickle.   Rodman Pickle, M.D. [facility] Pulmonary/Critical Care Medicine 03/12/2019 2:25 PM  Pager: [phone] After hours pager: [phone]

## 2019-03-12 NOTE — Procedures (Signed)
Chest Tube Insertion Procedure Note  Indications:  Clinically significant Right Pneumothorax  Pre-operative Diagnosis: Pneumothorax  Post-operative Diagnosis: Pneumothorax  Procedure Details  Informed consent was obtained for the procedure, including sedation.  Risks of lung perforation, hemorrhage, arrhythmia, and adverse drug reaction were discussed.   After sterile skin prep, using standard technique, needle inserted above T4 with aspiration of air. Unable to pass guide wire despite multiple attempts. Repositioned needle with aspiration of air however patient developed hemoptysis. Procedure aborted for safety.   Findings: None  Estimated Blood Loss:  less than 50 mL         Specimens:  None              Complications:  Small volume hemoptysis in ETT. Chest tube insertion was aborted. ETT was monitored post-procedure with no further hemoptysis         Disposition: ICU - intubated and critically ill.         Condition: unstable  Attending Attestation: I performed the procedure.

## 2019-03-13 LAB — GLUCOSE, CAPILLARY: Glucose-Capillary: 112 mg/dL — ABNORMAL HIGH (ref 70–99)

## 2019-03-13 MED FILL — Medication: Qty: 1 | Status: AC

## 2019-03-15 ENCOUNTER — Telehealth: Payer: Self-pay | Admitting: *Deleted

## 2019-03-15 NOTE — Telephone Encounter (Signed)
Received original D/C-D/C forwarded to [facility] for [facility].

## 2019-03-15 NOTE — Telephone Encounter (Signed)
Patient is deceased - pr

## 2019-03-16 NOTE — Telephone Encounter (Signed)
Received original signed D/C, funeral home notified for pick up.  

## 2019-03-26 DIAGNOSIS — R0902 Hypoxemia: Secondary | ICD-10-CM

## 2019-04-10 NOTE — Progress Notes (Signed)
Notified Patients Husband of change in condition - husband stated "what kind of f**ing moron calls to wake me up in the middle of the f**ing night to tell me my wife is dead? What difference does it make if I know now or you wait until 8 o'clock in the morning to tell me?"  I attempted to explain that I was offering the chance to come say final goodbyes- He responded " I don't f**ing care. What is wrong with you? 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 What the f** is wrong with you?"  I apologized for the in-convience of waking him up.  He replied " I don't f**ing care, what is wrong with you, you f**ing moron?" and hung up the phone.

## 2019-04-10 NOTE — Progress Notes (Signed)
Notified [facility]-  Referral Number: [id number] [place]

## 2019-04-10 NOTE — Progress Notes (Signed)
Time of Death: 35 verified through auscultation, Second verification [place].

## 2019-04-10 NOTE — Discharge Summary (Signed)
Physician Discharge Summary  Patient ID: MACIEL KEGG MRN: [id number] DOB/AGE: 1950/10/31 68 y.o.  Admit date: 02/16/2019 Discharge date: 03/20/2019  Admission Diagnoses: Acute hypoxic respiratory failure, COPD exacerbation, CAP  Discharge Diagnoses:  Active Problems:   DNR (do not resuscitate) discussion   Interstitial lung disease (HCC)   Acute respiratory failure with hypoxemia (HCC)   Palliative care by specialist   Weakness generalized   Dyspnea   Protein-calorie malnutrition, severe   Pulmonary infiltrates   Pneumothorax   Discharged Condition: deceased  Hospital Course: Ms. Kibby was admitted with acute hypoxic respiratory failure due to an ILD exacerbation. Despite treatment with empiric high-dose steroids and antibiotics in addition to supportive oxygen therapies from HFNC to bipap to mechanical ventilation she had progressive respiratory failure. She developed bilateral pneumothoraces due to her fibrotic lung disease. Unfortunately die to her tenuous condition and severe lung damage, her fibrosis and respiratory failure progressed with refractory hypercapniec respiratory failure and worsening pneumothoraces. She suffered a cardiac arrest and expired.  Consults: cardiology, pulmonary/intensive care, GI and Palliative Care  Significant Diagnostic Studies: 10/11 CXR> Chronic interstitial lung disease with new R sided opacities.  10/12 ECHO >>> 10/12 CT angio chest> No PE. enlarged heart, no pericardial effusion. Diffuse ground-glass appearance throughout lungs. No Pleural Effusion. Emphysematous changes.  10/13 ECHO> 1. Left ventricular ejection fraction, by visual estimation, is 55 to 60%. The left ventricle has normal function. Normal left ventricular size. There is no left ventricular hypertrophy. 2. Left ventricular diastolic Doppler parameters are consistent with impaired relaxation pattern of LV diastolic filling. 3. Global right ventricle has severely reduced  systolic function.The right ventricular size is mildly enlarged. 4. Left atrial size was mildly dilated. 5. Right atrial size was normal. 6. The mitral valve is normal in structure. Trace mitral valve regurgitation. No evidence of mitral stenosis. 7. The tricuspid valve is normal in structure. Tricuspid valve regurgitation is mild. 8. The aortic valve is tricuspid Aortic valve regurgitation was not visualized by color flow Doppler. Structurally normal aortic valve, with no evidence of sclerosis or stenosis. 9. The pulmonic valve was not well visualized. Pulmonic valve regurgitation is not visualized by color flow Doppler. 10. Moderately elevated pulmonary artery systolic pressure. 11. The inferior vena cava is dilated in size with >50% respiratory variability, suggesting right atrial pressure of 8 mmHg. 12. Normal LV systolic function; grade 1 diastolic dysfunction; mild LAE; mild RVE with severe RV dysfunction; cannot R/O thrombus in RV on apical images; suggest CTA to further assess; mild TR; moderate pulmonary hypertension.  10/13 CXR> Chronic diffuse bilateral interstitial prominence, with some interval improvement-- likely improved pulmonary edema \  Echo 10/19: Left Ventricle: Left ventricular ejection fraction, by visual estimation, is 50 to 55%. The left ventricle has mildly decreased function. There is no left ventricular hypertrophy. Normal left ventricular size. Spectral Doppler shows Left ventricular  diastolic Doppler parameters are consistent with impaired relaxation pattern of LV diastolic filling. Markedly abnormal septal motion. Right Ventricle: The right ventricular size is mildly enlarged. No increase in right ventricular wall thickness. Global RV systolic function is has mildly reduced systolic function.   Treatments:  10/11 admitted  10/12 PCCM consulted. Serologies suggest inflamm  10/21 - says not better. On 10LNC at rest and as she talks pulse ox is 88%.  Says pre-admit was not on o2 but doing well. Says brother in [place] who is 6 years older Brooke Bonito) also diagnosed with pulmonary fibrosis in Feb 2020. She wants me to call him.  Chart review shows RV strain + but she does not have edema.  Chart review shows essentially negative serology  (RF is < 2x ULN, all other serologies negative). 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 Palliative Care consulted for assistance with care.  10/22  - Relatively normal RHC and no rv failure. 80% LAD stenosis. Start solumedrol 125mg  IV q6h. Started higher dose IV steroids (Solumedrol 125 q6 hrs)  10/23 -  s/p cardiac MRI. Results pending. Cards prefers to see course with steroids and results of cardiac MRI before deciding on stent.   10/27 - plan for LHC today, but had 4gm drop in hgb so procedure on hold. Steroids decreased to reduced GIB risk. Unable to undergo invasive evaluation by GI due to respiratory instability.  10/30 chest tube on left placed for persistent PTX and evolving pneumomediastinum   11/2 progressive respiratory failure on BiPAP requiring transfer to the ICU, intubation, and MV. New right-sided pneumothorax and persistent left-sided pneumothorax.    Disposition: family will notify the hospital of subsequent funeral home arrangements     Signed: Julian Hy 03/19/2019, 4:34 AM

## 2019-04-10 NOTE — Code Documentation (Signed)
Code called for severe hypotension despite escalating pressors. Attempt made to needle decompress just before code called to allow for improved circulation. Despite a loud air leak, her hemodynamics failed to improve. Chest compressions were started and continued with epinephrine doses per ACLS algorithm. All pulse and rhythm checks were PEA. She likely developed multiple broken ribs during chest compressions, and she eventually had copious amounts of blood squirting out of the ambu bag and ETT. At that time the entire code blue team agreed that there were not additional measures we could provide to reverse the situation, and the code was stopped with everyone in agreement. Time of death 04:14.  Julia Massey has been notified of Julia Massey's death. Julia Massey's daughter Julia Massey is possibly on her way to the hospital now and Julia Massey is hopeful that Julia Massey will choose to have time to see her mother. Julia Massey has previously indicated that he does not wish to be updated until tomorrow. Julia Massey will notify the remainder of the family. My condolences were expressed.  This patient is critically ill with multiple organ system failure which requires frequent high complexity decision making, assessment, support, evaluation, and titration of therapies. This was completed through the application of advanced monitoring technologies and extensive interpretation of multiple databases. During this encounter critical care time was devoted to patient care services described in this note for 25 minutes.   Julian Hy, DO 04/03/2019 4:31 AM [facility]

## 2019-04-10 NOTE — Progress Notes (Signed)
Patients husband called back now states "he will be here at noon after going to go vote since that is a priority today. He clearly stated do not call him anymore and that he will be here at noon. When asked for total clarification about resuscitation efforts he stated "keep her alive". It was made clear we would do what was reasonable but also that medical efforts to sustain life at this point are not guaranteed.

## 2019-04-10 NOTE — Progress Notes (Signed)
eLink Physician-Brief Progress Note Patient Name: Julia Massey DOB: 1950/05/26 MRN: [id number]   Date of Service  03/22/2019  HPI/Events of Note  Multiple issues: 1. Hypoxia - Sat = 73%  and 2. Hypotension  - BP = 79/63 - Clinical picture suggests progression of R pneumothorax to tension. Bedside team evaluated earlier and felt that additional chest tube placement would not improve patient's condition.   eICU Interventions  Will order: 1. ABG now. 2. Increase ceiling on Norepinephrine IV infusion to 70 mcg/min.   However, I feel that additional medical interventions in this patient will only prolong her death without resolution of pneumothorax in patient on mechanical ventilation.      Intervention Category Major Interventions: Hypoxemia - evaluation and management;Hypotension - evaluation and management  Lysle Dingwall 03/22/2019, 2:31 AM

## 2019-04-10 DEATH — deceased

## 2021-04-05 IMAGING — CT CT ANGIO CHEST
2 of 6 series · 18 of 36 positions shown · IV contrast (omnipaque)
Comparison: One-view chest x-ray 02/18/2019.

CLINICAL DATA: Shortness of breath. PE suspected, high pretest
probability.

EXAM:
CT ANGIOGRAPHY CHEST WITH CONTRAST
TECHNIQUE: Multidetector CT imaging of the chest was performed using the
standard protocol during bolus administration of intravenous
contrast. Multiplanar CT image reconstructions and MIPs were
obtained to evaluate the vascular anatomy.
CONTRAST:  65mL OMNIPAQUE IOHEXOL 350 MG/ML SOLN

[Series 7: pe thins · axial · 0.65mm/px · z∈[+1108,+1335]mm · 17 of 362 slices shown]
[im 19/362  lung]
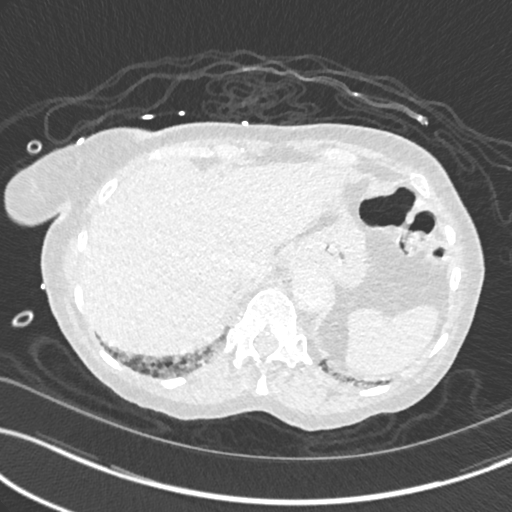
[im 37/362  mediastinal]
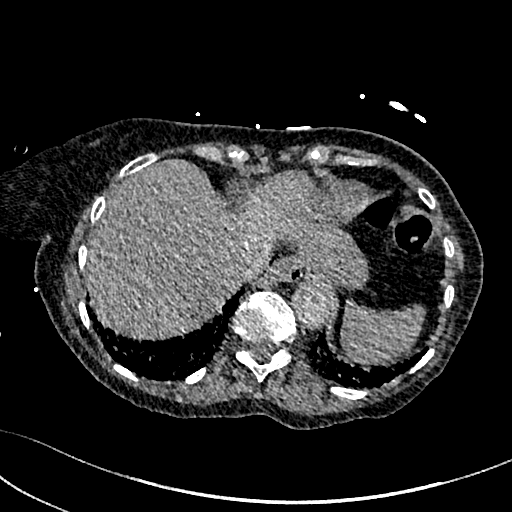
[im 55/362  lung]
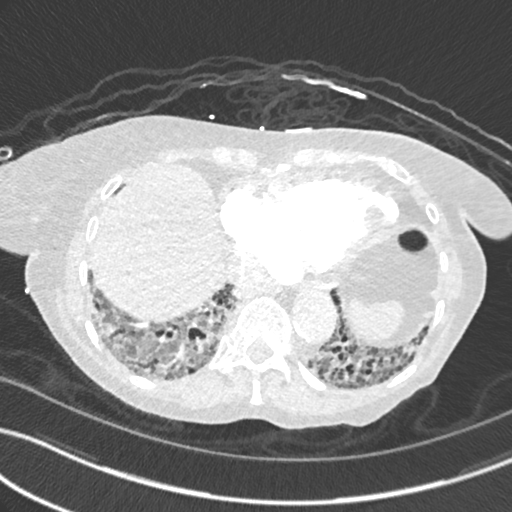
[im 73/362  mediastinal]
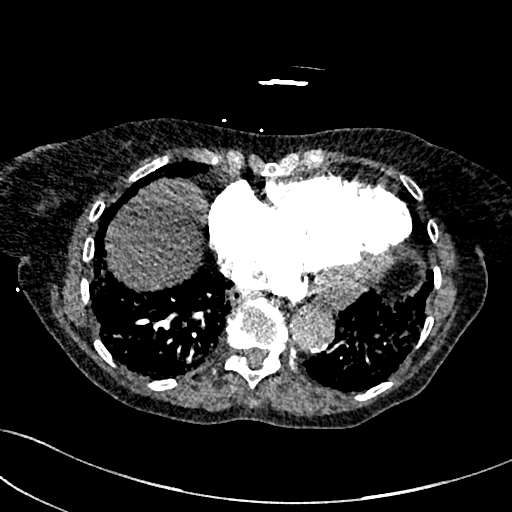
[im 109/362  lung]
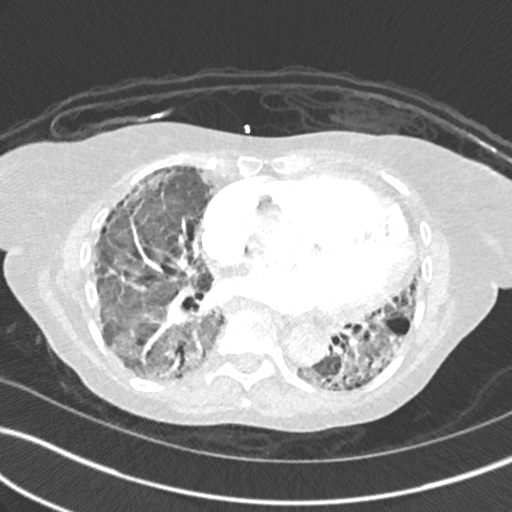
[im 127/362  mediastinal]
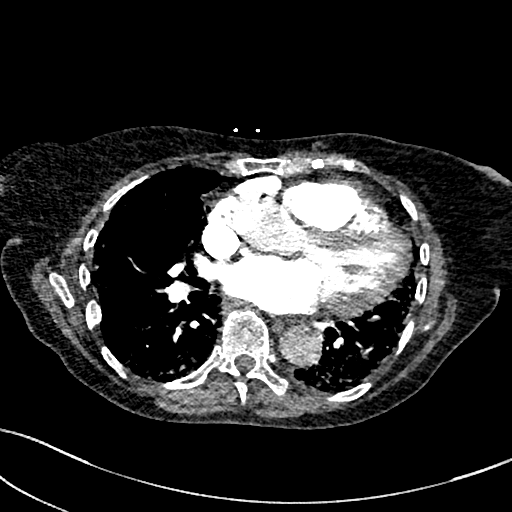
[im 145/362  lung]
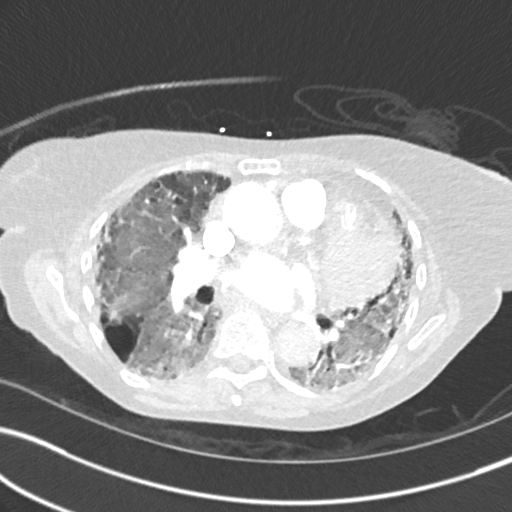
[im 163/362  mediastinal]
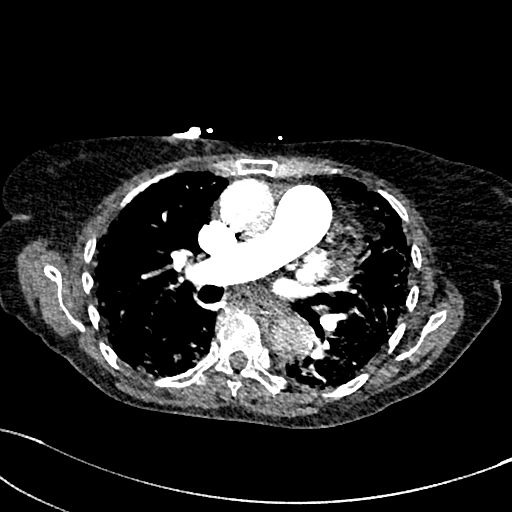
[im 181/362  lung]
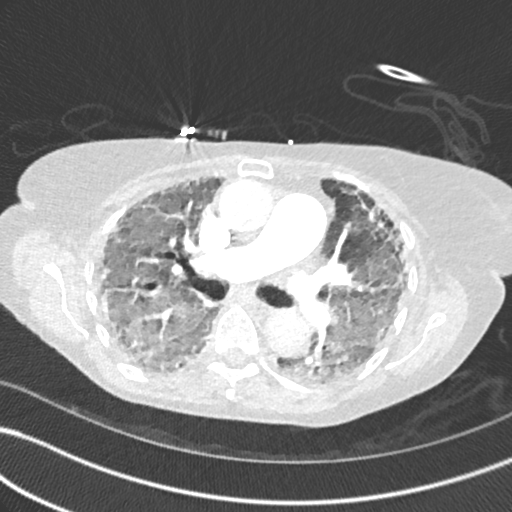
[im 199/362  mediastinal]
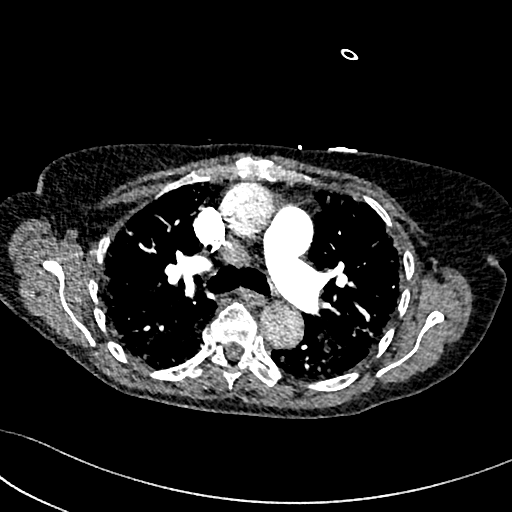
[im 217/362  lung]
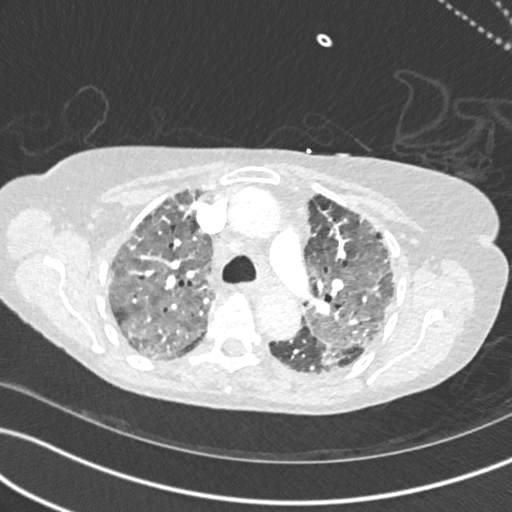
[im 235/362  mediastinal]
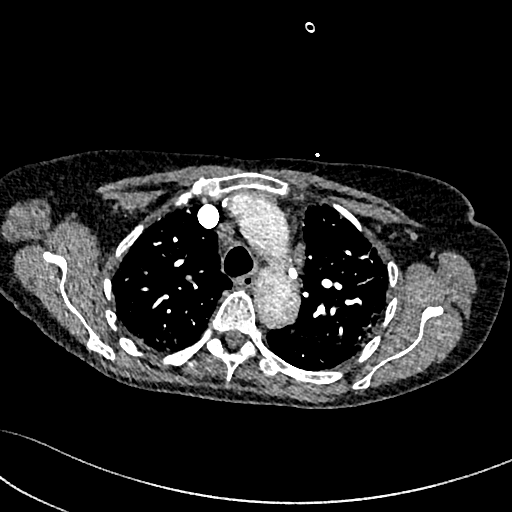
[im 253/362  lung]
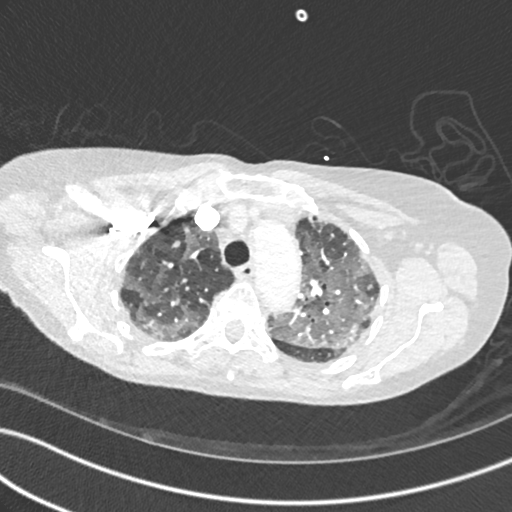
[im 289/362  mediastinal]
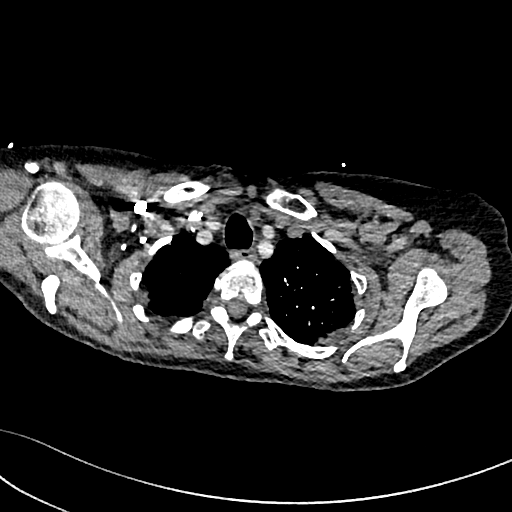
[im 307/362  lung]
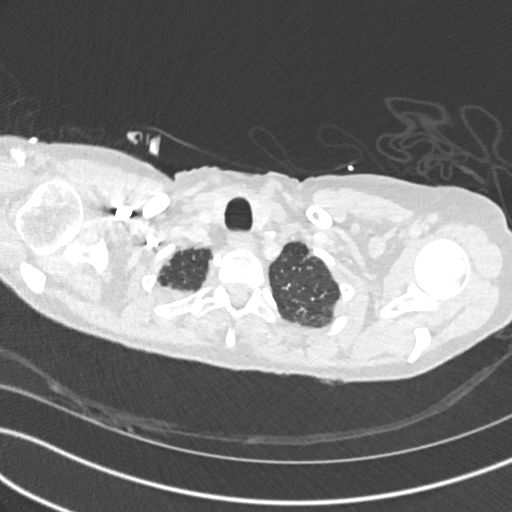
[im 325/362  mediastinal]
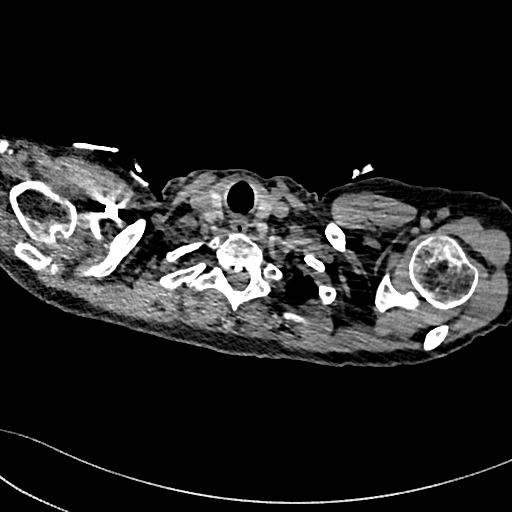
[im 343/362  lung]
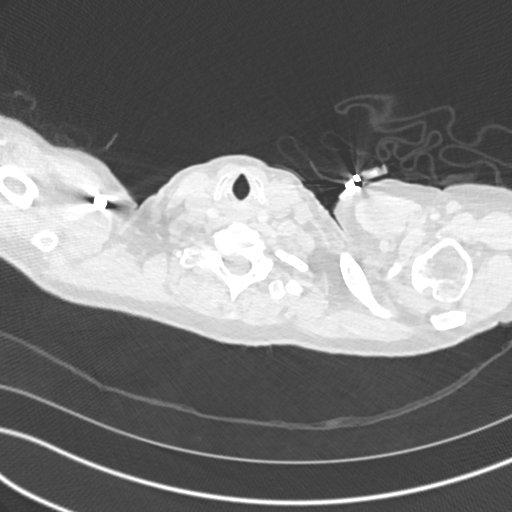

[Series 8: pe 2mm cor · coronal · 0.50mm/px · 1 of 151 slices shown]
[im 76/151  mediastinal]
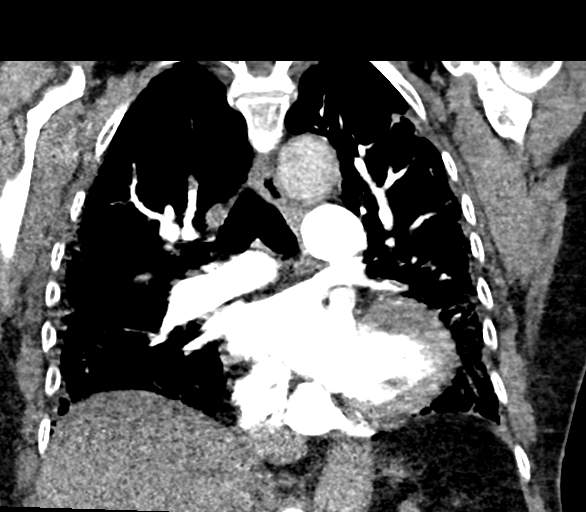

[18 of 36 positions shown; findings below may reference images not displayed]

FINDINGS: Cardiovascular: Heart is enlarged. No significant pericardial
effusion is present. Atherosclerotic changes are noted at the arch
great vessels without definite stenosis. There is no aneurysm of the
aorta.

Pulmonary artery opacification is excellent. No focal filling
defects are present. The study is mildly degraded by patient motion.
Main pulmonary outflow tract measures over 3.5 cm. Right pulmonary
artery measures 2.5 cm. Left pulmonary artery measures 2.4 cm.

Mediastinum/Nodes: No significant mediastinal or hilar adenopathy is
present. The thoracic inlet is within normal limits. Esophagus is
within normal limits.

Lungs/Pleura: There is diffuse ground-glass attenuation throughout
both lungs. Known chronic interstitial lung disease is again seen.
No significant airspace consolidation is present. There is
honeycombing at the bases. No significant pleural effusion or
pneumothorax is present.

Upper Abdomen: Unremarkable

Musculoskeletal: A remote superior endplate fractures present at
T11. Vertebral body heights and alignment are otherwise normal.

Review of the MIP images confirms the above findings.
IMPRESSION: 1. No pulmonary embolus.
2. Enlarged pulmonary arteries and right ventricle suggesting
pulmonary arterial hypertension.
3. Diffuse ground-glass attenuation throughout the lungs suggesting
edema or possibly infection.
4. No significant pleural effusion.
5. Similar appearance of chronic interstitial lung disease.
6.  Aortic Atherosclerosis ([id number]).
